# Patient Record
Sex: Female | Born: 1945 | Race: White | Hispanic: No
Health system: Southern US, Community
[De-identification: ages and names within clinical notes are randomized; demographics above are authoritative.]

## PROBLEM LIST (undated history)

## (undated) DIAGNOSIS — N200 Calculus of kidney: Secondary | ICD-10-CM

## (undated) DIAGNOSIS — Z9221 Personal history of antineoplastic chemotherapy: Secondary | ICD-10-CM

## (undated) DIAGNOSIS — Z87898 Personal history of other specified conditions: Secondary | ICD-10-CM

## (undated) DIAGNOSIS — R1310 Dysphagia, unspecified: Secondary | ICD-10-CM

## (undated) DIAGNOSIS — Z923 Personal history of irradiation: Secondary | ICD-10-CM

## (undated) DIAGNOSIS — I471 Supraventricular tachycardia, unspecified: Secondary | ICD-10-CM

## (undated) DIAGNOSIS — Z8489 Family history of other specified conditions: Secondary | ICD-10-CM

## (undated) DIAGNOSIS — E039 Hypothyroidism, unspecified: Secondary | ICD-10-CM

## (undated) DIAGNOSIS — J309 Allergic rhinitis, unspecified: Secondary | ICD-10-CM

## (undated) DIAGNOSIS — M419 Scoliosis, unspecified: Secondary | ICD-10-CM

## (undated) DIAGNOSIS — F329 Major depressive disorder, single episode, unspecified: Secondary | ICD-10-CM

## (undated) DIAGNOSIS — E7800 Pure hypercholesterolemia, unspecified: Secondary | ICD-10-CM

## (undated) DIAGNOSIS — I10 Essential (primary) hypertension: Secondary | ICD-10-CM

## (undated) DIAGNOSIS — F32A Depression, unspecified: Secondary | ICD-10-CM

## (undated) DIAGNOSIS — T8859XA Other complications of anesthesia, initial encounter: Secondary | ICD-10-CM

## (undated) DIAGNOSIS — K219 Gastro-esophageal reflux disease without esophagitis: Secondary | ICD-10-CM

## (undated) DIAGNOSIS — C50919 Malignant neoplasm of unspecified site of unspecified female breast: Secondary | ICD-10-CM

## (undated) DIAGNOSIS — F419 Anxiety disorder, unspecified: Secondary | ICD-10-CM

## (undated) DIAGNOSIS — C4490 Unspecified malignant neoplasm of skin, unspecified: Secondary | ICD-10-CM

## (undated) HISTORY — DX: Gastro-esophageal reflux disease without esophagitis: K21.9

## (undated) HISTORY — DX: Pure hypercholesterolemia, unspecified: E78.00

## (undated) HISTORY — DX: Supraventricular tachycardia, unspecified: I47.10

## (undated) HISTORY — DX: Personal history of other specified conditions: Z87.898

## (undated) HISTORY — DX: Dysphagia, unspecified: R13.10

## (undated) HISTORY — DX: Supraventricular tachycardia: I47.1

## (undated) HISTORY — DX: Malignant neoplasm of unspecified site of unspecified female breast: C50.919

## (undated) HISTORY — DX: Hypothyroidism, unspecified: E03.9

## (undated) HISTORY — DX: Allergic rhinitis, unspecified: J30.9

## (undated) HISTORY — DX: Scoliosis, unspecified: M41.9

## (undated) HISTORY — PX: BREAST EXCISIONAL BIOPSY: SUR124

## (undated) HISTORY — DX: Calculus of kidney: N20.0

## (undated) HISTORY — PX: ABDOMINAL HYSTERECTOMY: SHX81

---
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
## 1993-07-09 DIAGNOSIS — C50919 Malignant neoplasm of unspecified site of unspecified female breast: Secondary | ICD-10-CM

## 1993-07-09 HISTORY — PX: BREAST LUMPECTOMY: SHX2

## 1993-07-09 HISTORY — DX: Malignant neoplasm of unspecified site of unspecified female breast: C50.919

## 1994-07-09 HISTORY — PX: BREAST LUMPECTOMY: SHX2

## 1994-07-09 HISTORY — PX: BREAST LUMPECTOMY WITH AXILLARY LYMPH NODE DISSECTION: SHX5756

## 2004-07-14 ENCOUNTER — Ambulatory Visit: Payer: Self-pay | Admitting: General Surgery

## 2005-07-13 ENCOUNTER — Ambulatory Visit: Payer: Self-pay | Admitting: General Surgery

## 2005-08-27 ENCOUNTER — Emergency Department: Payer: Self-pay | Admitting: Emergency Medicine

## 2005-08-31 ENCOUNTER — Ambulatory Visit: Payer: Self-pay | Admitting: Unknown Physician Specialty

## 2005-09-04 ENCOUNTER — Ambulatory Visit (HOSPITAL_COMMUNITY): Admission: RE | Admit: 2005-09-04 | Discharge: 2005-09-05 | Payer: Self-pay | Admitting: Neurosurgery

## 2005-09-06 HISTORY — PX: LUMBAR LAMINECTOMY: SHX95

## 2006-07-15 ENCOUNTER — Ambulatory Visit: Payer: Self-pay | Admitting: General Surgery

## 2007-08-14 ENCOUNTER — Ambulatory Visit: Payer: Self-pay | Admitting: General Surgery

## 2007-11-11 ENCOUNTER — Ambulatory Visit: Payer: Self-pay | Admitting: Unknown Physician Specialty

## 2008-09-07 ENCOUNTER — Ambulatory Visit: Payer: Self-pay | Admitting: General Surgery

## 2008-11-05 ENCOUNTER — Emergency Department: Payer: Self-pay | Admitting: Emergency Medicine

## 2008-11-10 ENCOUNTER — Ambulatory Visit: Payer: Self-pay | Admitting: Cardiology

## 2009-07-05 ENCOUNTER — Ambulatory Visit: Payer: Self-pay | Admitting: Unknown Physician Specialty

## 2009-09-08 ENCOUNTER — Ambulatory Visit: Payer: Self-pay | Admitting: General Surgery

## 2010-09-11 ENCOUNTER — Ambulatory Visit: Payer: Self-pay | Admitting: Unknown Physician Specialty

## 2011-09-13 ENCOUNTER — Ambulatory Visit: Payer: Self-pay | Admitting: Unknown Physician Specialty

## 2012-04-03 ENCOUNTER — Ambulatory Visit: Payer: Self-pay | Admitting: Internal Medicine

## 2012-04-14 ENCOUNTER — Ambulatory Visit (INDEPENDENT_AMBULATORY_CARE_PROVIDER_SITE_OTHER): Payer: Medicare Other | Admitting: Internal Medicine

## 2012-04-14 ENCOUNTER — Encounter: Payer: Self-pay | Admitting: Internal Medicine

## 2012-04-14 VITALS — BP 116/64 | HR 62 | Temp 98.5°F | Ht 66.0 in | Wt 147.5 lb

## 2012-04-14 DIAGNOSIS — C50919 Malignant neoplasm of unspecified site of unspecified female breast: Secondary | ICD-10-CM

## 2012-04-14 DIAGNOSIS — I10 Essential (primary) hypertension: Secondary | ICD-10-CM | POA: Insufficient documentation

## 2012-04-14 DIAGNOSIS — K219 Gastro-esophageal reflux disease without esophagitis: Secondary | ICD-10-CM | POA: Insufficient documentation

## 2012-04-14 DIAGNOSIS — E119 Type 2 diabetes mellitus without complications: Secondary | ICD-10-CM | POA: Insufficient documentation

## 2012-04-14 DIAGNOSIS — E039 Hypothyroidism, unspecified: Secondary | ICD-10-CM | POA: Insufficient documentation

## 2012-04-14 DIAGNOSIS — Z853 Personal history of malignant neoplasm of breast: Secondary | ICD-10-CM | POA: Insufficient documentation

## 2012-04-14 DIAGNOSIS — Z23 Encounter for immunization: Secondary | ICD-10-CM

## 2012-04-14 DIAGNOSIS — E7800 Pure hypercholesterolemia, unspecified: Secondary | ICD-10-CM | POA: Insufficient documentation

## 2012-04-14 MED ORDER — METOPROLOL TARTRATE 50 MG PO TABS: 50.0000 mg | ORAL_TABLET | Freq: Two times a day (BID) | ORAL | Status: DC

## 2012-04-14 MED ORDER — LORAZEPAM 0.5 MG PO TABS: 0.5000 mg | ORAL_TABLET | Freq: Every day | ORAL | Status: DC | PRN

## 2012-04-14 MED ORDER — PANTOPRAZOLE SODIUM 40 MG PO TBEC: 40.0000 mg | DELAYED_RELEASE_TABLET | Freq: Two times a day (BID) | ORAL | Status: DC

## 2012-04-14 NOTE — Assessment & Plan Note (Signed)
Sugars as outlined.  Obtain last labs for review.  Continue diabetic diet and exercise as tolerated.  She continues to follow up at [facility].

## 2012-04-14 NOTE — Patient Instructions (Signed)
It was nice seeing you today.  I am going to change the Prevacid to generic Protonix.  Take as directed.  Let me know if any problems.  I am also going to change the extended release Metoprolol - to the regular Metoprolol.   You will have to take this one twice a day.  Monitory your blood pressure and pulse and let me know if any problems.  You will also be given Ativan (Lorazepam) to take as directed (as needed).  Let me know if any problems.

## 2012-04-14 NOTE — Assessment & Plan Note (Signed)
Diagnosed at age 66.  S/P chemotherapy/XRT.  Reports she is up to date with her mammogram.  Discussed genetic counseling and testing.  She is interested.  Refer to the cancer center for genetic counseling.

## 2012-04-14 NOTE — Assessment & Plan Note (Signed)
Controlled on current med regimen.  She needs to change Prevacid (secondary to cost).  Will change to generic Protonix 40mg  bid.  Notify me if problems.

## 2012-04-14 NOTE — Progress Notes (Signed)
  Subjective:    Patient ID: Michelle Hawkins, female    DOB: 02/27/1946, 66 y.o.   MRN: [id number]  HPI 66 year old female with past history of breast cancer (s/p chemotherapy/XRT), hypertension, diabetes and hypercholesterolemia who comes in today for a scheduled follow up.  She states she is doing relatively well.  She does report some increased stress and anxiety lately.  She tapered off Lexapro and has been off now for approximately one month.  She feels she needs something just to take prn.  Does not feel she needs something daily.  No depression.  She also reports her acid reflux is controlled as long as she takes Prevacid regularly.  She needs to change Prevacid and Metoprolol - secondary to cost.  States her blood pressure has been doing well and her sugars in the am mostly average 105-120.  She has been eating more sweets lately.  Plans to get back to following her diet more regularly.  She has stopped going to [employer] (closed), but does plan to get back into a regular exercise routine.    Past Medical History  Diagnosis Date  . Breast cancer   . Chicken pox   . Diabetes mellitus   . H/O ulcer disease   . GERD (gastroesophageal reflux disease)   . Hypercholesterolemia   . Nephrolithiasis   . Clotting disorder   . Hypothyroidism   . Blood transfusion     Review of Systems Patient denies any headache, lightheadedness or dizziness.  No chest pain, tightness or palpatations.  No increased shortness of breath, cough or congestion.  No nausea or vomiting.  Acid reflux controlled on her current med regimen.   No abdominal pain or cramping.  No bowel change, such as diarrhea, constipation, BRBPR or melana.  No urine change.  Has gained some weight (per her report).        Objective:   Physical Exam Filed Vitals:   04/14/12 1333  BP: 116/64  Pulse: 62  Temp: 98.5 F (36.9 C)   66year old female in no acute distress.   HEENT:  Nares - clear.  OP- without lesions or erythema.  NECK:   Supple, nontender.  No audible carotid bruit.   HEART:  Appears to be regular. LUNGS:  Without crackles or wheezing audible.  Respirations even and unlabored.   RADIAL PULSE:  Equal bilaterally.  ABDOMEN:  Soft, nontender.  No audible abdominal bruit.   EXTREMITIES:  No increased edema to be present.  DP pulses palpable and equal bilaterally.  (2 plus)                Assessment & Plan:  INCREASED STRESS/ANXIETY.  Tapered off the Lexapro.  Feels she needs something prn.  Discussed at length with her today.  Will start Ativan .5mg  and instructed to take 1/2 to one q day prn.  Monitor symptoms closely.  If problems, may need to restart a daily medicine.  No depression.   HEALTH MAINTENANCE.  Will schedule her physical in December.  Review records to see when due follow up colonoscopy and mammogram.  Flu shot given today.

## 2012-04-14 NOTE — Assessment & Plan Note (Signed)
Continue levothyroxine.  Follow tsh.   

## 2012-04-15 ENCOUNTER — Other Ambulatory Visit: Payer: Self-pay | Admitting: *Deleted

## 2012-04-15 NOTE — Assessment & Plan Note (Signed)
Blood pressure is under good control.  She would like to change the extended release Metoprolol to the "regular" Metoprolol - secondary to cost.  Will stop Metoprolol extended release and start Metoprolol Tartrate 50mg  bid.  Have her monitor her blood pressure and pulse.  If any problems, let me know.

## 2012-04-15 NOTE — Assessment & Plan Note (Signed)
On generic Lipitor.  Low cholesterol diet and exercise.  Follow lipid profile and liver panel.  Review records to see when due follow up labs.

## 2012-04-15 NOTE — Telephone Encounter (Signed)
Rx called to [facility] pharmacy. 

## 2012-04-30 ENCOUNTER — Other Ambulatory Visit: Payer: Self-pay | Admitting: *Deleted

## 2012-04-30 MED ORDER — LEVOTHYROXINE SODIUM 25 MCG PO TABS: 25.0000 ug | ORAL_TABLET | Freq: Every day | ORAL | Status: DC

## 2012-06-19 ENCOUNTER — Encounter: Payer: Medicare Other | Admitting: Internal Medicine

## 2012-06-27 ENCOUNTER — Telehealth: Payer: Self-pay | Admitting: *Deleted

## 2012-06-27 NOTE — Telephone Encounter (Signed)
Pt is coming in for labs Monday (Dec. 23,2013) What labs and dx code would you like? Thank you

## 2012-06-28 ENCOUNTER — Other Ambulatory Visit: Payer: Self-pay | Admitting: Internal Medicine

## 2012-06-28 DIAGNOSIS — C50919 Malignant neoplasm of unspecified site of unspecified female breast: Secondary | ICD-10-CM

## 2012-06-28 DIAGNOSIS — E119 Type 2 diabetes mellitus without complications: Secondary | ICD-10-CM

## 2012-06-28 DIAGNOSIS — E039 Hypothyroidism, unspecified: Secondary | ICD-10-CM

## 2012-06-28 DIAGNOSIS — E7800 Pure hypercholesterolemia, unspecified: Secondary | ICD-10-CM

## 2012-06-28 DIAGNOSIS — I10 Essential (primary) hypertension: Secondary | ICD-10-CM

## 2012-06-28 NOTE — Telephone Encounter (Signed)
Labs ordered (cbc, met b, liver panel, lipid profile, tsh, urine microalbumin/cr ratio and a1c).

## 2012-06-28 NOTE — Progress Notes (Signed)
Orders placed for labs 06/30/12.

## 2012-06-30 ENCOUNTER — Other Ambulatory Visit (INDEPENDENT_AMBULATORY_CARE_PROVIDER_SITE_OTHER): Payer: PRIVATE HEALTH INSURANCE

## 2012-06-30 DIAGNOSIS — E039 Hypothyroidism, unspecified: Secondary | ICD-10-CM

## 2012-06-30 DIAGNOSIS — E7800 Pure hypercholesterolemia, unspecified: Secondary | ICD-10-CM

## 2012-06-30 DIAGNOSIS — I10 Essential (primary) hypertension: Secondary | ICD-10-CM

## 2012-06-30 DIAGNOSIS — E119 Type 2 diabetes mellitus without complications: Secondary | ICD-10-CM

## 2012-06-30 DIAGNOSIS — C50919 Malignant neoplasm of unspecified site of unspecified female breast: Secondary | ICD-10-CM

## 2012-06-30 LAB — BASIC METABOLIC PANEL
BUN: 13 mg/dL (ref 6–23)
CO2: 25 mEq/L (ref 19–32)
Chloride: 98 mEq/L (ref 96–112)
GFR: 101.97 mL/min (ref >60.00)
Glucose, Bld: 121 mg/dL — ABNORMAL HIGH (ref 70–99)
Potassium: 4.1 mEq/L (ref 3.5–5.1)
Sodium: 132 mEq/L — ABNORMAL LOW (ref 135–145)

## 2012-06-30 LAB — LIPID PANEL
Cholesterol: 157 mg/dL (ref 0–200)
LDL Cholesterol: 89 mg/dL (ref 0–99)
Total CHOL/HDL Ratio: 3
VLDL: 15.4 mg/dL (ref 0.0–40.0)

## 2012-06-30 LAB — MICROALBUMIN / CREATININE URINE RATIO
Creatinine,U: 61.5 mg/dL
Microalb Creat Ratio: 0.3 mg/g (ref 0.0–30.0)
Microalb, Ur: 0.2 mg/dL (ref 0.0–1.9)

## 2012-06-30 LAB — HEPATIC FUNCTION PANEL
ALT: 29 U/L (ref 0–35)
AST: 25 U/L (ref 0–37)
Albumin: 3.6 g/dL (ref 3.5–5.2)
Alkaline Phosphatase: 66 U/L (ref 39–117)
Bilirubin, Direct: 0 mg/dL (ref 0.0–0.3)
Total Protein: 6.6 g/dL (ref 6.0–8.3)

## 2012-06-30 LAB — TSH: TSH: 2.13 u[IU]/mL (ref 0.35–5.50)

## 2012-06-30 LAB — CBC WITH DIFFERENTIAL/PLATELET
Eosinophils Absolute: 1.2 10*3/uL — ABNORMAL HIGH (ref 0.0–0.7)
Eosinophils Relative: 11.5 % — ABNORMAL HIGH (ref 0.0–5.0)
Lymphocytes Relative: 27.6 % (ref 12.0–46.0)
Lymphs Abs: 2.9 10*3/uL (ref 0.7–4.0)
Monocytes Absolute: 1.2 10*3/uL — ABNORMAL HIGH (ref 0.1–1.0)
Neutro Abs: 5.1 10*3/uL (ref 1.4–7.7)

## 2012-06-30 LAB — HEMOGLOBIN A1C: Hgb A1c MFr Bld: 7.3 % — ABNORMAL HIGH (ref 4.6–6.5)

## 2012-07-01 ENCOUNTER — Other Ambulatory Visit: Payer: Self-pay | Admitting: Internal Medicine

## 2012-07-01 DIAGNOSIS — D649 Anemia, unspecified: Secondary | ICD-10-CM

## 2012-07-01 DIAGNOSIS — E871 Hypo-osmolality and hyponatremia: Secondary | ICD-10-CM

## 2012-07-01 NOTE — Progress Notes (Signed)
Orders placed for follow up labs 

## 2012-07-07 ENCOUNTER — Encounter: Payer: Self-pay | Admitting: Internal Medicine

## 2012-07-07 ENCOUNTER — Ambulatory Visit (INDEPENDENT_AMBULATORY_CARE_PROVIDER_SITE_OTHER): Payer: PRIVATE HEALTH INSURANCE | Admitting: Internal Medicine

## 2012-07-07 VITALS — BP 130/80 | HR 62 | Temp 98.0°F | Ht 66.0 in | Wt 147.2 lb

## 2012-07-07 DIAGNOSIS — K219 Gastro-esophageal reflux disease without esophagitis: Secondary | ICD-10-CM

## 2012-07-07 DIAGNOSIS — C50919 Malignant neoplasm of unspecified site of unspecified female breast: Secondary | ICD-10-CM

## 2012-07-07 DIAGNOSIS — E119 Type 2 diabetes mellitus without complications: Secondary | ICD-10-CM

## 2012-07-07 DIAGNOSIS — E7800 Pure hypercholesterolemia, unspecified: Secondary | ICD-10-CM

## 2012-07-07 DIAGNOSIS — D649 Anemia, unspecified: Secondary | ICD-10-CM

## 2012-07-07 DIAGNOSIS — E871 Hypo-osmolality and hyponatremia: Secondary | ICD-10-CM

## 2012-07-07 DIAGNOSIS — I10 Essential (primary) hypertension: Secondary | ICD-10-CM

## 2012-07-07 DIAGNOSIS — E039 Hypothyroidism, unspecified: Secondary | ICD-10-CM

## 2012-07-07 DIAGNOSIS — Z139 Encounter for screening, unspecified: Secondary | ICD-10-CM

## 2012-07-07 LAB — FERRITIN: Ferritin: 16 ng/mL (ref 10.0–291.0)

## 2012-07-08 ENCOUNTER — Telehealth: Payer: Self-pay | Admitting: Internal Medicine

## 2012-07-08 LAB — CBC WITH DIFFERENTIAL/PLATELET
Basophils Relative: 0.5 % (ref 0.0–3.0)
Eosinophils Absolute: 1.1 10*3/uL — ABNORMAL HIGH (ref 0.0–0.7)
Eosinophils Relative: 9.3 % — ABNORMAL HIGH (ref 0.0–5.0)
Monocytes Absolute: 0.9 10*3/uL (ref 0.1–1.0)
Neutro Abs: 7 10*3/uL (ref 1.4–7.7)
RBC: 4.36 Mil/uL (ref 3.87–5.11)
RDW: 16 % — ABNORMAL HIGH (ref 11.5–14.6)

## 2012-07-08 NOTE — Telephone Encounter (Signed)
Pt is calling and saying that she dropped off her sugar readings yesterday but she is needing those back if possible. Please call pt

## 2012-07-09 ENCOUNTER — Encounter: Payer: Self-pay | Admitting: Internal Medicine

## 2012-07-09 ENCOUNTER — Other Ambulatory Visit: Payer: Self-pay | Admitting: Internal Medicine

## 2012-07-09 DIAGNOSIS — E871 Hypo-osmolality and hyponatremia: Secondary | ICD-10-CM | POA: Insufficient documentation

## 2012-07-09 DIAGNOSIS — D72829 Elevated white blood cell count, unspecified: Secondary | ICD-10-CM

## 2012-07-09 NOTE — Assessment & Plan Note (Signed)
Sodium just checked.  Slightly decreased.  Recheck sodium today.

## 2012-07-09 NOTE — Assessment & Plan Note (Signed)
Sugar a little elevated.  Discussed with her today.  States she has not been exercising and has not been watching what she eats.  Wants to work on diet and exercise before adding more medication.  She is due to follow up at [facility] 07/22/12.  Follow.  Hold on additional medication at this time.

## 2012-07-09 NOTE — Assessment & Plan Note (Signed)
On lipitor.  Low cholesterol diet and exercise.   Follow lipid panel and liver function.   

## 2012-07-09 NOTE — Assessment & Plan Note (Signed)
Blood pressure under good control.  Same medication.  Follow metabolic panel.   

## 2012-07-09 NOTE — Progress Notes (Signed)
Subjective:    Patient ID: Michelle Hawkins, female    DOB: 09-05-1945, 67 y.o.   MRN: [id number]  HPI 67 year old female with past history of breast cancer (s/p chemotherapy/XRT), hypertension, diabetes and hypercholesterolemia who comes in today to follow up on these issues as well as for a complete physical exam.  She states she is doing relatively well.  She has stopped going to [employer] (closed), but does plan to get back into a regular exercise routine.  Discussed her sugar.  Starting to elevate some.  She states she has not been eating like she should.  She has been eating increased sweets.  Plans to get more serious about her diet.  No acid reflux.  No bowel change.  Due a follow up colonoscopy.  She is due to follow up at [facility] 07/22/12.  No cardiac symptoms with increased activity or exertion.    Past Medical History  Diagnosis Date  . Breast cancer 1996    s/p lumpectomy (lymph node dissection - 2/11 positive), chemotherapy  . Diabetes mellitus   . H/O ulcer disease     PUD  . GERD (gastroesophageal reflux disease)   . Hypercholesterolemia   . Nephrolithiasis   . Hypothyroidism     multinodular goiter  . Scoliosis   . Dysphagia   . SVT (supraventricular tachycardia)   . Allergic rhinitis     Current Outpatient Prescriptions on File Prior to Visit  Medication Sig Dispense Refill  . aspirin 81 MG tablet Take 81 mg by mouth daily.      Marland Kitchen atorvastatin (LIPITOR) 20 MG tablet Take 20 mg by mouth daily.      . Biotin (BIOTIN 5000) 5 MG CAPS Take by mouth.      . calcium carbonate 200 MG capsule Take 250 mg by mouth 2 (two) times daily with a meal.      . cholecalciferol (VITAMIN D) 400 UNITS TABS Take by mouth.      . escitalopram (LEXAPRO) 20 MG tablet Take 20 mg by mouth daily.      . fish oil-omega-3 fatty acids 1000 MG capsule Take 2 g by mouth daily.      Marland Kitchen levothyroxine (SYNTHROID, LEVOTHROID) 25 MCG tablet Take 1 tablet (25 mcg total) by mouth daily.  90 tablet  1  .  LORazepam (ATIVAN) 0.5 MG tablet Take 1 tablet (0.5 mg total) by mouth daily as needed for anxiety (take 1/2 to one tablet q day prn. ).  30 tablet  1  . metFORMIN (GLUCOPHAGE) 500 MG tablet Take 500 mg by mouth 2 (two) times daily with a meal.      . metoprolol (LOPRESSOR) 50 MG tablet Take 1 tablet (50 mg total) by mouth 2 (two) times daily.  180 tablet  3  . pantoprazole (PROTONIX) 40 MG tablet Take 1 tablet (40 mg total) by mouth 2 (two) times daily.  180 tablet  3  . ramipril (ALTACE) 10 MG capsule Take 10 mg by mouth daily.      . TURMERIC PO Take by mouth.        Review of Systems Patient denies any headache, lightheadedness or dizziness.  No chest pain, tightness or palpatations. No increased shortness of breath, cough or congestion.  No nausea or vomiting.  Acid reflux controlled on her current med regimen.   No abdominal pain or cramping.  No bowel change, such as diarrhea, constipation, BRBPR or melana.  No urine change.  Plans to get more  serious about her diet and exercise.        Objective:   Physical Exam  Filed Vitals:   07/07/12 1021  BP: 130/80  Pulse: 62  Temp: 98 F (15.71 C)   67 year old female in no acute distress.   HEENT:  Nares- clear.  Oropharynx - without lesions. NECK:  Supple.  Nontender.  No audible bruit.  HEART:  Appears to be regular. LUNGS:  No crackles or wheezing audible.  Respirations even and unlabored.  RADIAL PULSE:  Equal bilaterally.    BREASTS:  No nipple discharge or nipple retraction present.  Could not appreciate any distinct nodules or axillary adenopathy.  ABDOMEN:  Soft, nontender.  Bowel sounds present and normal.  No audible abdominal bruit.  GU:  Normal external genitalia.  Vaginal vault without lesions.  S/P hysterectomy.  Could not appreciate any adnexal masses or tenderness.   RECTAL:  Heme negative.   EXTREMITIES:  No increased edema present.  DP pulses palpable and equal bilaterally.           Assessment & Plan:  INCREASED  STRESS/ANXIETY.  Off the Lexapro.  Has Ativan .5mg  and instructed to take 1/2 to one q day prn.  Monitor symptoms closely.  If problems, may need to restart a daily medicine.  No depression.   ANEMIA.  Recheck cbc and ferritin today.    HEALTH MAINTENANCE.  Physical today.  She is s/p hysterectomy.  Flu shot given last visit.  Mammogram 09/23/11 BiRADS II.  Last colonoscopy 2004.  Due a follow up colonoscopy 2014.  Will schedule an appt with GI.

## 2012-07-09 NOTE — Assessment & Plan Note (Signed)
Previously saw Dr Lemar Livings and Dr Doylene Canning.  Has been released.  Mammogram 09/13/11 - BiRADS II.

## 2012-07-09 NOTE — Assessment & Plan Note (Signed)
On synthroid.  Follow tsh.   

## 2012-07-09 NOTE — Progress Notes (Signed)
Pt notified of lab results and need for follow up labs.  She is going to come in 07/30/12 at 10:30 for follow up labs.  Please put on lab schedule.  Thanks.  Pt aware of appt.

## 2012-07-09 NOTE — Assessment & Plan Note (Signed)
Symptoms controlled.  Continue prevacid.

## 2012-07-21 ENCOUNTER — Other Ambulatory Visit: Payer: Self-pay | Admitting: Internal Medicine

## 2012-07-21 ENCOUNTER — Telehealth: Payer: Self-pay | Admitting: Internal Medicine

## 2012-07-21 MED ORDER — ATORVASTATIN CALCIUM 20 MG PO TABS: 20.0000 mg | ORAL_TABLET | Freq: Every day | ORAL | Status: DC

## 2012-07-21 MED ORDER — LEVOTHYROXINE SODIUM 25 MCG PO TABS: 25.0000 ug | ORAL_TABLET | Freq: Every day | ORAL | Status: DC

## 2012-07-21 MED ORDER — METOPROLOL TARTRATE 50 MG PO TABS: 50.0000 mg | ORAL_TABLET | Freq: Two times a day (BID) | ORAL | Status: DC

## 2012-07-21 MED ORDER — PANTOPRAZOLE SODIUM 40 MG PO TBEC: 40.0000 mg | DELAYED_RELEASE_TABLET | Freq: Two times a day (BID) | ORAL | Status: DC

## 2012-07-21 MED ORDER — RAMIPRIL 10 MG PO CAPS: 10.0000 mg | ORAL_CAPSULE | Freq: Every day | ORAL | Status: DC

## 2012-07-21 MED ORDER — LORAZEPAM 0.5 MG PO TABS: ORAL_TABLET | ORAL | Status: DC

## 2012-07-21 MED ORDER — METFORMIN HCL 500 MG PO TABS: 500.0000 mg | ORAL_TABLET | Freq: Two times a day (BID) | ORAL | Status: DC

## 2012-07-21 MED ORDER — GLUCOSE BLOOD VI STRP: ORAL_STRIP | Status: DC

## 2012-07-21 NOTE — Progress Notes (Signed)
rx printed for meds as requested.

## 2012-07-21 NOTE — Telephone Encounter (Signed)
Pt is needing refills on Atorvastatin 20 mg,Levothyroxine 25 mcg, lorazepam 0.5 mg, metformin 500 mg, metroprolol 50 mg, pantoprazole 40 mg and ramipril 10 mg. She also needs her One Touch Test Strips. She is needing all these in paper form so she can mail them into her new pharmacy. She says she will be in town tomorrow morning and was wanting to know if she could have them then.

## 2012-07-21 NOTE — Telephone Encounter (Signed)
Prescriptions printed and on your desk.

## 2012-07-22 NOTE — Telephone Encounter (Signed)
Called patient to let her know her prescriptions are waiting up front for her.

## 2012-07-30 ENCOUNTER — Other Ambulatory Visit: Payer: Self-pay | Admitting: Internal Medicine

## 2012-07-30 ENCOUNTER — Other Ambulatory Visit: Payer: PRIVATE HEALTH INSURANCE

## 2012-07-30 ENCOUNTER — Other Ambulatory Visit (INDEPENDENT_AMBULATORY_CARE_PROVIDER_SITE_OTHER): Payer: Medicare Other

## 2012-07-30 DIAGNOSIS — D72829 Elevated white blood cell count, unspecified: Secondary | ICD-10-CM

## 2012-07-30 LAB — CBC WITH DIFFERENTIAL/PLATELET
Basophils Absolute: 0.1 10*3/uL (ref 0.0–0.1)
Eosinophils Relative: 8.1 % — ABNORMAL HIGH (ref 0.0–5.0)
HCT: 38.4 % (ref 36.0–46.0)
Lymphs Abs: 3.6 10*3/uL (ref 0.7–4.0)
MCHC: 32.8 g/dL (ref 30.0–36.0)
MCV: 85.7 fl (ref 78.0–100.0)
Monocytes Absolute: 1.2 10*3/uL — ABNORMAL HIGH (ref 0.1–1.0)
Platelets: 381 10*3/uL (ref 150.0–400.0)
RDW: 15.7 % — ABNORMAL HIGH (ref 11.5–14.6)

## 2012-07-30 NOTE — Telephone Encounter (Signed)
[facility]  Pt came in today for labs and stated she has mail order pharmacy and has ordered the below rx  But she needs 2 weeks sent to [facility] pharmcy so she will have enough till her mail order comes in  levothroxine 25 mcg tabl levothroxine sodium Take 1 tablet ( total) by mouth daily   Take on empty stomach

## 2012-07-31 MED ORDER — LEVOTHYROXINE SODIUM 25 MCG PO TABS: 25.0000 ug | ORAL_TABLET | Freq: Every day | ORAL | Status: DC

## 2012-07-31 NOTE — Telephone Encounter (Signed)
Sent in to pharmacy.  

## 2012-08-04 ENCOUNTER — Other Ambulatory Visit: Payer: Self-pay | Admitting: Internal Medicine

## 2012-08-04 DIAGNOSIS — D72829 Elevated white blood cell count, unspecified: Secondary | ICD-10-CM

## 2012-08-04 NOTE — Progress Notes (Signed)
Order placed for follow up cbc 

## 2012-08-28 ENCOUNTER — Telehealth: Payer: Self-pay | Admitting: Internal Medicine

## 2012-08-28 DIAGNOSIS — C50919 Malignant neoplasm of unspecified site of unspecified female breast: Secondary | ICD-10-CM

## 2012-08-28 NOTE — Telephone Encounter (Signed)
Pt is needing a new One Touch Ultra 2 meter and the strips for it. Her metformin rx is written wrong she is supposed to take 2 in the AM and 2 in the PM but Dr. Lorin Picket wrote it for 1 in the AM and 1 in the PM. She is wanting to know if we could change is back to 2 time in the AM and 2 times in the PM and its 500 mg per Pill. She mails it in to her mail in pharmacy which [facility].

## 2012-08-28 NOTE — Telephone Encounter (Signed)
Pt came by wanting [place] to schedule her Diagnostic Mammo. She uses [facility] and would like a mid-morning appt if possible. I looked up the order and Dr. Lorin Picket put it as a screening instead of a Diagnostic pt has history of Breat Cancer.

## 2012-08-28 NOTE — Telephone Encounter (Signed)
Pt says she would like to go on what she was on before she started the Adavant. She does not want to take that any more because it is more habit forming. She would like to take what she was on before which was something she took everyday.

## 2012-08-29 MED ORDER — METFORMIN HCL 500 MG PO TABS: ORAL_TABLET | ORAL | Status: DC

## 2012-08-29 MED ORDER — GLUCOSE BLOOD VI STRP: ORAL_STRIP | Status: DC

## 2012-08-29 NOTE — Telephone Encounter (Signed)
Order placed for metformin.  I changed the directions to metformin 500mg  (2 tablets bid).  I also placed the order for the mammogram and printed a prescription for her test strips.   Let me know if she needs anything else.

## 2012-08-29 NOTE — Telephone Encounter (Signed)
She was previously on Lexapro.  Notify pt can restart lexapro 10mg  q day.  Will need to send in to her pharmacy.  Also add to med list ----if pt agreeable to start.   I will print out a prescription for the meter.  We do not have this type of meter here in the office.  I will place order for mammo.

## 2012-08-29 NOTE — Telephone Encounter (Signed)
Patient notified

## 2012-09-04 ENCOUNTER — Other Ambulatory Visit: Payer: Self-pay | Admitting: *Deleted

## 2012-09-04 ENCOUNTER — Other Ambulatory Visit (INDEPENDENT_AMBULATORY_CARE_PROVIDER_SITE_OTHER): Payer: Medicare Other

## 2012-09-04 DIAGNOSIS — D72829 Elevated white blood cell count, unspecified: Secondary | ICD-10-CM

## 2012-09-04 LAB — CBC WITH DIFFERENTIAL/PLATELET
Eosinophils Relative: 6 % — ABNORMAL HIGH (ref 0.0–5.0)
MCV: 84.9 fl (ref 78.0–100.0)
Monocytes Absolute: 1 10*3/uL (ref 0.1–1.0)
Neutrophils Relative %: 54 % (ref 43.0–77.0)
Platelets: 367 10*3/uL (ref 150.0–400.0)
WBC: 9.5 10*3/uL (ref 4.5–10.5)

## 2012-09-04 MED ORDER — GLUCOSE BLOOD VI STRP: ORAL_STRIP | Status: DC

## 2012-09-04 NOTE — Telephone Encounter (Signed)
RX's given to patient in office.

## 2012-09-05 ENCOUNTER — Encounter: Payer: Self-pay | Admitting: Internal Medicine

## 2012-09-06 HISTORY — PX: BREAST BIOPSY: SHX20

## 2012-09-15 ENCOUNTER — Ambulatory Visit: Payer: Self-pay | Admitting: Internal Medicine

## 2012-09-16 ENCOUNTER — Ambulatory Visit: Payer: Self-pay | Admitting: Internal Medicine

## 2012-09-17 ENCOUNTER — Telehealth: Payer: Self-pay | Admitting: General Practice

## 2012-09-17 DIAGNOSIS — R928 Other abnormal and inconclusive findings on diagnostic imaging of breast: Secondary | ICD-10-CM

## 2012-09-17 NOTE — Telephone Encounter (Signed)
Pt notified of mammo results and need for referral to surgery for evaluation/question of biopsy.  Wants to see Dr Lemar Livings.  Order placed for referral.

## 2012-09-17 NOTE — Telephone Encounter (Signed)
Called pt on both cell and home phone number.  Unable to reach.  Will try back.

## 2012-09-17 NOTE — Telephone Encounter (Signed)
Michelle Hawkins from [facility] called stating that we were sent an abnormal mammogram result. Wanted to make sure we had set pt up with a surgeon. Please call them back if this has been completed.

## 2012-09-17 NOTE — Telephone Encounter (Signed)
Dr. Lorin Picket did you receive these results?

## 2012-09-17 NOTE — Telephone Encounter (Signed)
Ms Kot notified of results and need for referral to surgery.  Christy notified.  Order placed for referral.

## 2012-09-19 ENCOUNTER — Encounter: Payer: Self-pay | Admitting: *Deleted

## 2012-09-24 ENCOUNTER — Telehealth: Payer: Self-pay | Admitting: Internal Medicine

## 2012-09-24 NOTE — Telephone Encounter (Signed)
Pt came in today wanting to get a rx for a glucose machine and test strips.  She has sent this to 2 different mail order and they dont carry this. Pt wanted to know if you could send this to a local pharmcy  [facility]   Please advise pt when this is done  Pt stated her meter is not working Can this be called in asap

## 2012-09-25 ENCOUNTER — Ambulatory Visit (INDEPENDENT_AMBULATORY_CARE_PROVIDER_SITE_OTHER): Payer: Medicare Other | Admitting: General Surgery

## 2012-09-25 ENCOUNTER — Encounter: Payer: Self-pay | Admitting: General Surgery

## 2012-09-25 VITALS — BP 120/80 | HR 64 | Resp 12 | Ht 65.0 in | Wt 148.0 lb

## 2012-09-25 DIAGNOSIS — R928 Other abnormal and inconclusive findings on diagnostic imaging of breast: Secondary | ICD-10-CM

## 2012-09-25 DIAGNOSIS — N63 Unspecified lump in unspecified breast: Secondary | ICD-10-CM

## 2012-09-25 MED ORDER — ONETOUCH ULTRA SYSTEM W/DEVICE KIT: 1.0000 | PACK | Freq: Once | Status: DC

## 2012-09-25 MED ORDER — GLUCOSE BLOOD VI STRP: ORAL_STRIP | Status: DC

## 2012-09-25 NOTE — Patient Instructions (Addendum)

## 2012-09-25 NOTE — Progress Notes (Addendum)
Patient ID: Michelle Hawkins, female   DOB: 1945-09-10, 67 y.o.   MRN: [id number]  Chief Complaint  Patient presents with  . Breast Cancer Long Term Follow Up    follow up mammogram    HPI Michelle Hawkins is a 67 y.o. female. Patient present for a follow up mammogram which was performed on 09/16/12 with a birad category 4. Patient has a history of left breast cancer in 1996. She received treatment by having a left lumpectomy done at that time. Patient performs self breast exams and gets regular mammograms. She states no new breast problems.  HPI  Past Medical History  Diagnosis Date  . Breast cancer 1996    s/p lumpectomy (lymph node dissection - 2/11 positive), chemotherapy  . Diabetes mellitus   . H/O ulcer disease     PUD  . GERD (gastroesophageal reflux disease)   . Hypercholesterolemia   . Nephrolithiasis   . Hypothyroidism     multinodular goiter  . Scoliosis   . Dysphagia   . SVT (supraventricular tachycardia)   . Allergic rhinitis     Past Surgical History  Procedure Laterality Date  . Breast lumpectomy with axillary lymph node dissection  1996    left  . Abdominal hysterectomy      partial  . Lumbar laminectomy  3/07    L5-S1    Family History  Problem Relation Age of Onset  . Heart disease Father   . Hypertension Father   . Diabetes Father     Social History History  Substance Use Topics  . Smoking status: Former Smoker    Types: Cigarettes    Quit date: 04/14/2004  . Smokeless tobacco: Never Used  . Alcohol Use: No    No Known Allergies  Current Outpatient Prescriptions  Medication Sig Dispense Refill  . aspirin 81 MG tablet Take 81 mg by mouth daily.      Marland Kitchen atorvastatin (LIPITOR) 20 MG tablet Take 1 tablet (20 mg total) by mouth daily.  90 tablet  3  . Biotin (BIOTIN 5000) 5 MG CAPS Take by mouth.      . Blood Glucose Monitoring Suppl (ONE TOUCH ULTRA SYSTEM KIT) W/DEVICE KIT 1 kit by Does not apply route once.  1 each  0  . calcium carbonate 200 MG  capsule Take 250 mg by mouth 2 (two) times daily with a meal.      . cholecalciferol (VITAMIN D) 400 UNITS TABS Take by mouth.      . fish oil-omega-3 fatty acids 1000 MG capsule Take 2 g by mouth daily.      Marland Kitchen glucose blood (ONE TOUCH ULTRA TEST) test strip One touch ultra II test strips Check blood sugar bid  Dx 250.00  100 each  12  . levothyroxine (SYNTHROID, LEVOTHROID) 25 MCG tablet Take 1 tablet (25 mcg total) by mouth daily.  14 tablet  0  . LORazepam (ATIVAN) 0.5 MG tablet 1/2 tablet q day prn  30 tablet  1  . metFORMIN (GLUCOPHAGE) 500 MG tablet Take two tablets bid  360 tablet  3  . metoprolol (LOPRESSOR) 50 MG tablet Take 1 tablet (50 mg total) by mouth 2 (two) times daily.  180 tablet  3  . pantoprazole (PROTONIX) 40 MG tablet Take 1 tablet (40 mg total) by mouth 2 (two) times daily.  180 tablet  1  . ramipril (ALTACE) 10 MG capsule Take 1 capsule (10 mg total) by mouth daily.  90 capsule  3  .  TURMERIC PO Take by mouth.       No current facility-administered medications for this visit.    Review of Systems Review of Systems  Constitutional: Negative.   Respiratory: Negative.   Cardiovascular: Negative.     Blood pressure 120/80, pulse 64, resp. rate 12, height 5\' 5"  (1.651 m), weight 148 lb (67.132 kg).  Physical Exam Physical Exam  Constitutional: She appears well-developed and well-nourished.  Neck: Normal range of motion. Neck supple.  Cardiovascular: Normal rate, regular rhythm and normal heart sounds.   Pulmonary/Chest: Effort normal and breath sounds normal.  Well healed scar in left axilla at 3 oclock.     Data Reviewed Mammogram dated September 16, 2012 and ultrasound the same date showed an ill-defined hypoechoic area near the surgical scar measuring up to 0.9 cm in diameter.BIRAD-4.  Screening mammograms dated September 15, 2012 showed the right breast to be unremarkablewith architectural distortion the left. BIRAD-0.  Assessment    Abnormal left breast  mammogram.    Plan    Vacuum biopsy to be completed today.       Michelle Hawkins 09/26/2012, 12:26 PM

## 2012-09-25 NOTE — Telephone Encounter (Signed)
Called patient to let her know prescriptions are waiting to be picked up at front desk.

## 2012-09-26 NOTE — Addendum Note (Signed)
Addended by: [name] on: 09/26/2012 12:30 PM   Modules accepted: Orders

## 2012-09-26 NOTE — Procedures (Signed)
Ultrasound confirmed a 0.74 x 0.91 x 1.1 cm hypo-echoic nodule in the 2 o'clock position of the breast 3 cm from the nipple. The patient was amenable to biopsy. This was completed using 10 cc of 0.5% Xylocaine with 0.25% Marcaine with 1-200,000 of epinephrine. A 10-gauge Encor device was used and a core samples obtained. No bleeding was noted. The skin defect was closed with benzoin and Steri-Strips followed by Telfa and Tegaderm dressing.

## 2012-09-29 ENCOUNTER — Telehealth: Payer: Self-pay | Admitting: General Surgery

## 2012-09-29 LAB — PATHOLOGY

## 2012-09-29 NOTE — Telephone Encounter (Signed)
Patient notified preliminary report on recent biopsy was benign. Final to follow in 24 hours.

## 2012-10-01 ENCOUNTER — Ambulatory Visit (INDEPENDENT_AMBULATORY_CARE_PROVIDER_SITE_OTHER): Payer: Medicare Other | Admitting: Internal Medicine

## 2012-10-01 ENCOUNTER — Encounter: Payer: Self-pay | Admitting: Internal Medicine

## 2012-10-01 ENCOUNTER — Encounter: Payer: Self-pay | Admitting: General Surgery

## 2012-10-01 ENCOUNTER — Telehealth: Payer: Self-pay | Admitting: Internal Medicine

## 2012-10-01 VITALS — BP 140/70 | HR 64 | Temp 97.4°F | Ht 66.0 in | Wt 147.8 lb

## 2012-10-01 DIAGNOSIS — E119 Type 2 diabetes mellitus without complications: Secondary | ICD-10-CM

## 2012-10-01 DIAGNOSIS — E039 Hypothyroidism, unspecified: Secondary | ICD-10-CM

## 2012-10-01 DIAGNOSIS — I10 Essential (primary) hypertension: Secondary | ICD-10-CM

## 2012-10-01 DIAGNOSIS — E871 Hypo-osmolality and hyponatremia: Secondary | ICD-10-CM

## 2012-10-01 DIAGNOSIS — K219 Gastro-esophageal reflux disease without esophagitis: Secondary | ICD-10-CM

## 2012-10-01 DIAGNOSIS — E7800 Pure hypercholesterolemia, unspecified: Secondary | ICD-10-CM

## 2012-10-01 LAB — BASIC METABOLIC PANEL
BUN: 10 mg/dL (ref 6–23)
CO2: 26 mEq/L (ref 19–32)
Chloride: 97 mEq/L (ref 96–112)
Potassium: 4.8 mEq/L (ref 3.5–5.1)

## 2012-10-01 LAB — HEPATIC FUNCTION PANEL
ALT: 34 U/L (ref 0–35)
AST: 34 U/L (ref 0–37)
Albumin: 4.1 g/dL (ref 3.5–5.2)
Alkaline Phosphatase: 72 U/L (ref 39–117)
Total Bilirubin: 0.5 mg/dL (ref 0.3–1.2)

## 2012-10-01 LAB — LIPID PANEL
Cholesterol: 183 mg/dL (ref 0–200)
HDL: 58.9 mg/dL (ref >39.00)
VLDL: 19.6 mg/dL (ref 0.0–40.0)

## 2012-10-01 LAB — HEMOGLOBIN A1C: Hgb A1c MFr Bld: 7.3 % — ABNORMAL HIGH (ref 4.6–6.5)

## 2012-10-01 MED ORDER — ESCITALOPRAM OXALATE 20 MG PO TABS: 20.0000 mg | ORAL_TABLET | Freq: Every day | ORAL | Status: DC

## 2012-10-01 NOTE — Telephone Encounter (Signed)
Meter is called One touch ultra II , Lancets One touch delica. The patient called to inform Dr. Lorin Picket.

## 2012-10-01 NOTE — Telephone Encounter (Signed)
rx written and on your desk 

## 2012-10-02 ENCOUNTER — Ambulatory Visit (INDEPENDENT_AMBULATORY_CARE_PROVIDER_SITE_OTHER): Payer: Medicare Other | Admitting: *Deleted

## 2012-10-02 ENCOUNTER — Encounter: Payer: Self-pay | Admitting: *Deleted

## 2012-10-02 DIAGNOSIS — Z853 Personal history of malignant neoplasm of breast: Secondary | ICD-10-CM

## 2012-10-02 NOTE — Progress Notes (Signed)
Patient here today for follow up post left breast biopsy.   Minimal bruising noted.  The patient is aware that a heating pad may be used for comfort as needed.  Aware of pathology. Follow up with primary care and here as needed.

## 2012-10-02 NOTE — Progress Notes (Addendum)
Biopsy pathology reviewed:  PATH REPORT.SITE OF ORIGIN SPEC  Comment    Comments: Material submitted: Marland Kitchen LEFT BREAST 2:00 *STAT* PATH REPORT.FINAL DX SPEC  Comment    Comments: Clinician provided ICD-9: 611.72 ; Lump or mass in breast PATH REPORT.FINAL DX SPEC  Comment    Comments:  Diagnosis: LEFT BREAST 2:00 *STAT*: - BENIGN BREAST TISSUE WITH SCLEROSING ADENOSIS AND APOCRINE CYSTS. - BENIGN BREAST TISSUE WITH ASSOCIATED MICROCALCIFICATIONS. - DENSE SCLEROTIC BREAST STROMA AND CHANGES CONSISTENT WITH PREVIOUS TREATMENT. - NEGATIVE FOR ATYPIA AND MALIGNANCY. COMMENT: Multiple additional deeper H/E levels were examined. Preliminary results of this case were discussed with Dr. Lemar Livings on September 29, 2012 by Dr. Excell Seltzer. The final results remain unchanged. XDB/09/29/2012 SIGNED OUT BY:  Comment    Comments: Electronically signed: . Gilman Schmidt, MD, Pathologist  The patient will follow up with the nurse as scheduled.  Will arrange for a follow up mammogram in six months.

## 2012-10-02 NOTE — Telephone Encounter (Signed)
Patient notified and pick up this morning.

## 2012-10-02 NOTE — Plan of Care (Addendum)
Biopsy pathology reviewed:  PATH REPORT.SITE OF ORIGIN SPEC  Comment    Comments: Material submitted: . LEFT BREAST 2:00 *STAT* PATH REPORT.FINAL DX SPEC  Comment    Comments: Clinician provided ICD-9: 611.72 ; Lump or mass in breast PATH REPORT.FINAL DX SPEC  Comment    Comments:  Diagnosis: LEFT BREAST 2:00 *STAT*: - BENIGN BREAST TISSUE WITH SCLEROSING ADENOSIS AND APOCRINE CYSTS. - BENIGN BREAST TISSUE WITH ASSOCIATED MICROCALCIFICATIONS. - DENSE SCLEROTIC BREAST STROMA AND CHANGES CONSISTENT WITH PREVIOUS TREATMENT. - NEGATIVE FOR ATYPIA AND MALIGNANCY. COMMENT: Multiple additional deeper H/E levels were examined. Preliminary results of this case were discussed with Dr. Byrnett on September 29, 2012 by Dr. Baker. The final results remain unchanged. XDB/09/29/2012 SIGNED OUT BY:  Comment    Comments: Electronically signed: . Dana D Baker, MD, Pathologist  The patient will follow up with the nurse as scheduled.  Will arrange for a follow up mammogram in six months.   

## 2012-10-06 ENCOUNTER — Encounter: Payer: Self-pay | Admitting: Internal Medicine

## 2012-10-06 NOTE — Assessment & Plan Note (Signed)
Symptoms controlled.  Continue prevacid.

## 2012-10-06 NOTE — Assessment & Plan Note (Signed)
Blood pressure under good control.  Same medication.  Follow metabolic panel.   

## 2012-10-06 NOTE — Assessment & Plan Note (Signed)
On synthroid.  Follow tsh.   

## 2012-10-06 NOTE — Progress Notes (Addendum)
Subjective:    Patient ID: Michelle Hawkins, female    DOB: 10-17-1945, 67 y.o.   MRN: [id number]  HPI 67 year old female with past history of breast cancer (s/p chemotherapy/XRT), hypertension, diabetes and hypercholesterolemia who comes in today for a scheduled follow up.  She states she is doing relatively well.  She has stopped going to [employer] (closed), but does plan to get back into a regular exercise routine.  Has still not started back exercising regularly.  Not watching what she is eating.  Discussed her sugar.  Starting to elevate some.  She has been eating increased sweets.  Plans to get more serious about her diet.  No acid reflux.  No bowel change.  Due a follow up colonoscopy.  No cardiac symptoms with increased activity or exertion.  She has seen Dr Lemar Livings recently.  Had a breast biopsy.  Was told preliminary - ok.  Has a follow up with him next week.  She had questions about genetic testing.  Phone number given to call.  Have already made referral.  She would like to restart lexapro.  Feel this may help to level things off.  Just taking a prn ativan currently.  Was previously on 20mg  lexapro.  Did well with this dose.     Past Medical History  Diagnosis Date  . Breast cancer 1996    s/p lumpectomy (lymph node dissection - 2/11 positive), chemotherapy  . Diabetes mellitus   . H/O ulcer disease     PUD  . GERD (gastroesophageal reflux disease)   . Hypercholesterolemia   . Nephrolithiasis   . Hypothyroidism     multinodular goiter  . Scoliosis   . Dysphagia   . SVT (supraventricular tachycardia)   . Allergic rhinitis     Current Outpatient Prescriptions on File Prior to Visit  Medication Sig Dispense Refill  . aspirin 81 MG tablet Take 81 mg by mouth daily.      Marland Kitchen atorvastatin (LIPITOR) 20 MG tablet Take 1 tablet (20 mg total) by mouth daily.  90 tablet  3  . Biotin (BIOTIN 5000) 5 MG CAPS Take by mouth.      . Blood Glucose Monitoring Suppl (ONE TOUCH ULTRA SYSTEM KIT)  W/DEVICE KIT 1 kit by Does not apply route once.  1 each  0  . calcium carbonate 200 MG capsule Take 250 mg by mouth 2 (two) times daily with a meal.      . cholecalciferol (VITAMIN D) 400 UNITS TABS Take by mouth.      . fish oil-omega-3 fatty acids 1000 MG capsule Take 2 g by mouth daily.      Marland Kitchen glucose blood (ONE TOUCH ULTRA TEST) test strip One touch ultra II test strips Check blood sugar bid  Dx 250.00  100 each  12  . levothyroxine (SYNTHROID, LEVOTHROID) 25 MCG tablet Take 1 tablet (25 mcg total) by mouth daily.  14 tablet  0  . LORazepam (ATIVAN) 0.5 MG tablet 1/2 tablet q day prn  30 tablet  1  . metFORMIN (GLUCOPHAGE) 500 MG tablet Take two tablets bid  360 tablet  3  . metoprolol (LOPRESSOR) 50 MG tablet Take 1 tablet (50 mg total) by mouth 2 (two) times daily.  180 tablet  3  . pantoprazole (PROTONIX) 40 MG tablet Take 1 tablet (40 mg total) by mouth 2 (two) times daily.  180 tablet  1  . ramipril (ALTACE) 10 MG capsule Take 1 capsule (10 mg total) by  mouth daily.  90 capsule  3  . TURMERIC PO Take by mouth daily.        No current facility-administered medications on file prior to visit.    Review of Systems Patient denies any headache, lightheadedness or dizziness.  No chest pain, tightness or palpitations. No increased shortness of breath, cough or congestion.  No nausea or vomiting.  Acid reflux controlled on her current med regimen.   No abdominal pain or cramping.  No bowel change, such as diarrhea, constipation, BRBPR or melana.  No urine change.  Plans to get more serious about her diet and exercise.        Objective:   Physical Exam  Filed Vitals:   10/01/12 1026  BP: 140/70  Pulse: 64  Temp: 97.4 F (85.41 C)   67 year old female in no acute distress.   HEENT:  Nares- clear.  Oropharynx - without lesions. NECK:  Supple.  Nontender.  No audible bruit.  HEART:  Appears to be regular. LUNGS:  No crackles or wheezing audible.  Respirations even and unlabored.   RADIAL PULSE:  Equal bilaterally.  ABDOMEN:  Soft, nontender.  Bowel sounds present and normal.  No audible abdominal bruit.    EXTREMITIES:  No increased edema present.  DP pulses palpable and equal bilaterally.           Assessment & Plan:  INCREASED STRESS/ANXIETY.  Off the Lexapro.  Discussed restarting.  She wants to restart.  Will restart Lexapro 20mg  qday.   Has Ativan .5mg  if needed.   No depression.  Follow.   ANEMIA.  Follow cbc and ferritin.  Last check wnl.   ABNORMAL MAMMOGRAM.  Referred to Dr Lemar Livings.  S/p biopsy.  Preliminary "ok".  Keep f/up appt next week.     HEALTH MAINTENANCE.  Physical last visit.  She is s/p hysterectomy.   Mammogram 09/23/11 BiRADS II.  Most recent mammo - Birads 4.  Referred to Dr Lemar Livings.  S/p biopsy.  Has follow up next week.  Last colonoscopy 2004.  Due a follow up colonoscopy 2014.  Scheduled an appt with GI.    Addendum.  Received colonoscopy report.  Pt had colonoscopy 10/14/12 - revealed internal hemorrhoids otherwise normal.  Recommended follow up colonoscopy in 10 years.

## 2012-10-06 NOTE — Assessment & Plan Note (Signed)
Previously saw Dr Lemar Livings and Dr Doylene Canning.  Had been released.  Mammogram 09/13/11 - BiRADS II.  Recent abnormal mammogram.  Seeing Dr Lemar Livings.  S/p breast biopsy.  Preliminary "ok".  Keep follow up appt next week.

## 2012-10-06 NOTE — Assessment & Plan Note (Signed)
Sugar a little elevated.  Discussed with her today.  States she has not been exercising and has not been watching what she eats.  Wants to work on diet and exercise before adding more medication.  Follow.  Hold on additional medication at this time.

## 2012-10-06 NOTE — Assessment & Plan Note (Signed)
On lipitor.  Low cholesterol diet and exercise.   Follow lipid panel and liver function.   

## 2012-10-14 ENCOUNTER — Ambulatory Visit: Payer: Self-pay | Admitting: Unknown Physician Specialty

## 2012-10-14 LAB — HM COLONOSCOPY

## 2012-10-29 ENCOUNTER — Telehealth: Payer: Self-pay | Admitting: Internal Medicine

## 2012-10-29 NOTE — Telephone Encounter (Signed)
Blood sugar log dropped off and placed in Tullo's file.

## 2012-11-13 ENCOUNTER — Encounter: Payer: Self-pay | Admitting: Internal Medicine

## 2013-01-20 ENCOUNTER — Ambulatory Visit (INDEPENDENT_AMBULATORY_CARE_PROVIDER_SITE_OTHER): Payer: Medicare Other | Admitting: Internal Medicine

## 2013-01-20 ENCOUNTER — Encounter: Payer: Self-pay | Admitting: Internal Medicine

## 2013-01-20 VITALS — BP 130/70 | HR 59 | Temp 98.1°F | Ht 66.0 in | Wt 144.5 lb

## 2013-01-20 DIAGNOSIS — E7800 Pure hypercholesterolemia, unspecified: Secondary | ICD-10-CM

## 2013-01-20 DIAGNOSIS — E119 Type 2 diabetes mellitus without complications: Secondary | ICD-10-CM

## 2013-01-20 DIAGNOSIS — K219 Gastro-esophageal reflux disease without esophagitis: Secondary | ICD-10-CM

## 2013-01-20 DIAGNOSIS — E039 Hypothyroidism, unspecified: Secondary | ICD-10-CM

## 2013-01-20 DIAGNOSIS — E871 Hypo-osmolality and hyponatremia: Secondary | ICD-10-CM

## 2013-01-20 DIAGNOSIS — I10 Essential (primary) hypertension: Secondary | ICD-10-CM

## 2013-01-20 LAB — BASIC METABOLIC PANEL
BUN: 12 mg/dL (ref 6–23)
CO2: 29 mEq/L (ref 19–32)
Chloride: 97 mEq/L (ref 96–112)
GFR: 91.5 mL/min (ref >60.00)
Glucose, Bld: 118 mg/dL — ABNORMAL HIGH (ref 70–99)
Potassium: 4.8 mEq/L (ref 3.5–5.1)
Sodium: 134 mEq/L — ABNORMAL LOW (ref 135–145)

## 2013-01-20 LAB — HEPATIC FUNCTION PANEL
ALT: 24 U/L (ref 0–35)
Total Bilirubin: 0.4 mg/dL (ref 0.3–1.2)

## 2013-01-20 LAB — LIPID PANEL
HDL: 61.1 mg/dL (ref >39.00)
LDL Cholesterol: 95 mg/dL (ref 0–99)
VLDL: 13 mg/dL (ref 0.0–40.0)

## 2013-01-20 LAB — HEMOGLOBIN A1C: Hgb A1c MFr Bld: 7.4 % — ABNORMAL HIGH (ref 4.6–6.5)

## 2013-01-21 ENCOUNTER — Other Ambulatory Visit: Payer: Self-pay | Admitting: Internal Medicine

## 2013-01-21 DIAGNOSIS — E871 Hypo-osmolality and hyponatremia: Secondary | ICD-10-CM

## 2013-01-21 NOTE — Progress Notes (Signed)
Order placed for f/u sodium.  ?

## 2013-01-24 ENCOUNTER — Encounter: Payer: Self-pay | Admitting: Internal Medicine

## 2013-01-24 NOTE — Assessment & Plan Note (Signed)
On synthroid.  Follow tsh.   

## 2013-01-24 NOTE — Assessment & Plan Note (Signed)
Blood pressure under good control.  Same medication.  Follow metabolic panel.   

## 2013-01-24 NOTE — Assessment & Plan Note (Signed)
Follow sodium.  

## 2013-01-24 NOTE — Progress Notes (Signed)
Subjective:    Patient ID: Michelle Hawkins, female    DOB: [birth year], 67 y.o.   MRN: [id number]  HPI 67 year old female with past history of breast cancer (s/p chemotherapy/XRT), hypertension, diabetes and hypercholesterolemia who comes in today for a scheduled follow up.  She states she is doing better.  She is watching what she eats now.  Back at [employer].  Some residual right leg issues that limits her with some of her exercise.  No chest pain or tightness.  Breathing stable.  No acid reflux.  no nausea or vomiting.  No bowel change.  Back on Lexapro.  Feels better.  Handling stress well.      Past Medical History  Diagnosis Date  . Breast cancer 1996    s/p lumpectomy (lymph node dissection - 2/11 positive), chemotherapy  . Diabetes mellitus   . H/O ulcer disease     PUD  . GERD (gastroesophageal reflux disease)   . Hypercholesterolemia   . Nephrolithiasis   . Hypothyroidism     multinodular goiter  . Scoliosis   . Dysphagia   . SVT (supraventricular tachycardia)   . Allergic rhinitis     Current Outpatient Prescriptions on File Prior to Visit  Medication Sig Dispense Refill  . aspirin 81 MG tablet Take 81 mg by mouth daily.      Marland Kitchen atorvastatin (LIPITOR) 20 MG tablet Take 1 tablet (20 mg total) by mouth daily.  90 tablet  3  . Biotin (BIOTIN 5000) 5 MG CAPS Take by mouth.      . Blood Glucose Monitoring Suppl (ONE TOUCH ULTRA SYSTEM KIT) W/DEVICE KIT 1 kit by Does not apply route once.  1 each  0  . calcium carbonate 200 MG capsule Take 250 mg by mouth 2 (two) times daily with a meal.      . cholecalciferol (VITAMIN D) 400 UNITS TABS Take by mouth.      . escitalopram (LEXAPRO) 20 MG tablet Take 1 tablet (20 mg total) by mouth daily.  90 tablet  1  . fish oil-omega-3 fatty acids 1000 MG capsule Take 2 g by mouth daily.      Marland Kitchen glucose blood (ONE TOUCH ULTRA TEST) test strip One touch ultra II test strips Check blood sugar bid  Dx 250.00  100 each  12  . levothyroxine  (SYNTHROID, LEVOTHROID) 25 MCG tablet Take 1 tablet (25 mcg total) by mouth daily.  14 tablet  0  . LORazepam (ATIVAN) 0.5 MG tablet 1/2 tablet q day prn  30 tablet  1  . metFORMIN (GLUCOPHAGE) 500 MG tablet Take two tablets bid  360 tablet  3  . metoprolol (LOPRESSOR) 50 MG tablet Take 1 tablet (50 mg total) by mouth 2 (two) times daily.  180 tablet  3  . pantoprazole (PROTONIX) 40 MG tablet Take 1 tablet (40 mg total) by mouth 2 (two) times daily.  180 tablet  1  . ramipril (ALTACE) 10 MG capsule Take 1 capsule (10 mg total) by mouth daily.  90 capsule  3  . TURMERIC PO Take by mouth daily.        No current facility-administered medications on file prior to visit.    Review of Systems Patient denies any headache, lightheadedness or dizziness.  No chest pain, tightness or palpitations. No increased shortness of breath, cough or congestion.  No nausea or vomiting.  Acid reflux controlled on her current med regimen.   No abdominal pain or cramping.  No  bowel change, such as diarrhea, constipation, BRBPR or melana.  No urine change.  Doing better with her diet.  Sugars better.  See her attached list.  AM sugars averaging 92-120 and PM sugars averaging 90-130s.       Objective:   Physical Exam  Filed Vitals:   01/20/13 0924  BP: 130/70  Pulse: 59  Temp: 98.1 F (36.7 C)   Pulse recheck;  69  67 year old female in no acute distress.   HEENT:  Nares- clear.  Oropharynx - without lesions. NECK:  Supple.  Nontender.  No audible bruit.  HEART:  Appears to be regular. LUNGS:  No crackles or wheezing audible.  Respirations even and unlabored.  RADIAL PULSE:  Equal bilaterally.  ABDOMEN:  Soft, nontender.  Bowel sounds present and normal.  No audible abdominal bruit.    EXTREMITIES:  No increased edema present.  DP pulses palpable and equal bilaterally.           Assessment & Plan:  INCREASED STRESS/ANXIETY.  Back on Lexapro.  Feels better.  Doing well.  Follow.   ANEMIA.  Follow cbc  and ferritin.  Last check wnl.   ABNORMAL MAMMOGRAM.  Referred to Dr Lemar Livings.  S/p biopsy.  Everything checked out fine.      HEALTH MAINTENANCE.  Physical 07/07/12.  She is s/p hysterectomy.   Mammogram 09/23/11 BiRADS II.  Most recent mammo - Birads 4.  Referred to Dr Lemar Livings.  S/p biopsy.  Everything checked out fine.  Last colonoscopy 10/14/12 revealed internal hemorrhoids otherwise normal.  Recommended follow up colonoscopy in 10 years.

## 2013-01-24 NOTE — Assessment & Plan Note (Signed)
Symptoms controlled.  Continue prevacid.

## 2013-01-24 NOTE — Assessment & Plan Note (Signed)
On lipitor.  Low cholesterol diet and exercise.   Follow lipid panel and liver function.   

## 2013-01-24 NOTE — Assessment & Plan Note (Signed)
Previously saw Dr Lemar Livings and Dr Doylene Canning.  Had been released.  Mammogram 09/13/11 - BiRADS II.  Recent abnormal mammogram.  Saw Dr Lemar Livings.  Biopsy ok.

## 2013-01-24 NOTE — Assessment & Plan Note (Signed)
Sugar as outlined.  Better.  Continues diet adjustment.  Check metabolic panel and a1c.

## 2013-02-06 ENCOUNTER — Other Ambulatory Visit: Payer: Self-pay | Admitting: *Deleted

## 2013-02-06 MED ORDER — ESCITALOPRAM OXALATE 20 MG PO TABS: 20.0000 mg | ORAL_TABLET | Freq: Every day | ORAL | Status: DC

## 2013-02-23 ENCOUNTER — Other Ambulatory Visit: Payer: Medicare Other

## 2013-02-23 ENCOUNTER — Other Ambulatory Visit (INDEPENDENT_AMBULATORY_CARE_PROVIDER_SITE_OTHER): Payer: Medicare Other

## 2013-02-23 DIAGNOSIS — E871 Hypo-osmolality and hyponatremia: Secondary | ICD-10-CM

## 2013-02-24 ENCOUNTER — Other Ambulatory Visit: Payer: Self-pay | Admitting: Internal Medicine

## 2013-02-24 DIAGNOSIS — E871 Hypo-osmolality and hyponatremia: Secondary | ICD-10-CM

## 2013-02-24 NOTE — Progress Notes (Signed)
Order placed for f/u sodium check.   

## 2013-02-26 ENCOUNTER — Encounter: Payer: Self-pay | Admitting: *Deleted

## 2013-02-26 ENCOUNTER — Other Ambulatory Visit (INDEPENDENT_AMBULATORY_CARE_PROVIDER_SITE_OTHER): Payer: Medicare Other

## 2013-02-26 DIAGNOSIS — E871 Hypo-osmolality and hyponatremia: Secondary | ICD-10-CM

## 2013-03-01 ENCOUNTER — Other Ambulatory Visit: Payer: Self-pay | Admitting: Internal Medicine

## 2013-03-01 DIAGNOSIS — E871 Hypo-osmolality and hyponatremia: Secondary | ICD-10-CM

## 2013-03-01 NOTE — Progress Notes (Signed)
Order placed for f/u sodium.  ?

## 2013-03-04 ENCOUNTER — Encounter: Payer: Self-pay | Admitting: *Deleted

## 2013-03-24 ENCOUNTER — Telehealth: Payer: Self-pay | Admitting: Internal Medicine

## 2013-03-24 NOTE — Telephone Encounter (Signed)
Would like chart noted that all billing for July and August from [place] was not paid by his insurance, stating pt had other insurance listed as primary.  Pt is aware that we did bill correctly from [facility].  States insurance has gotten their issue corrected and the pt just wants note made to her chart of this.

## 2013-03-30 ENCOUNTER — Other Ambulatory Visit (INDEPENDENT_AMBULATORY_CARE_PROVIDER_SITE_OTHER): Payer: Medicare Other

## 2013-03-30 ENCOUNTER — Telehealth: Payer: Self-pay | Admitting: Internal Medicine

## 2013-03-30 DIAGNOSIS — E871 Hypo-osmolality and hyponatremia: Secondary | ICD-10-CM

## 2013-03-30 LAB — SODIUM: Sodium: 133 mEq/L — ABNORMAL LOW (ref 135–145)

## 2013-03-30 NOTE — Telephone Encounter (Signed)
Pt stated she has been taking lexapro pt stated this says it can cause low sodium.   Pt stated she was given a rx for pantoprazole this is not working for her Checking on pneumonia shot  Does she need to take this  Waiting on her records from [facility]

## 2013-03-30 NOTE — Telephone Encounter (Signed)
Pt notified & in regards to her Protonix- "it just doesn't work". Pt notified that we will be back in touch soon with her lab results

## 2013-03-30 NOTE — Telephone Encounter (Signed)
Regarding the Lexapro, she has been on this previously and had not had an issue with her sodium.  Let me see how her level looks with this lab draw.  Hold on changing lexapro at this time.  I do not have the date from [facility] - when her last pneumonia shot.  She may be able to call and get this information or can sign another form for me to send over.  Regarding the "protonix not working" - need to know what symptoms she is having now.

## 2013-03-31 NOTE — Telephone Encounter (Signed)
Please notify pt that her sodium is stable (actually slightly improved).  We will continue to follow.  If she is having persistent problems and the protonix "not working" - I can see her 04/01/13 at 4:00 to evaluate further.

## 2013-03-31 NOTE — Telephone Encounter (Signed)
LMTCB

## 2013-04-01 ENCOUNTER — Encounter: Payer: Self-pay | Admitting: Internal Medicine

## 2013-04-01 ENCOUNTER — Ambulatory Visit (INDEPENDENT_AMBULATORY_CARE_PROVIDER_SITE_OTHER): Payer: Medicare Other | Admitting: Internal Medicine

## 2013-04-01 VITALS — BP 100/60 | HR 64 | Temp 98.3°F | Wt 147.0 lb

## 2013-04-01 DIAGNOSIS — Z23 Encounter for immunization: Secondary | ICD-10-CM

## 2013-04-01 DIAGNOSIS — K219 Gastro-esophageal reflux disease without esophagitis: Secondary | ICD-10-CM

## 2013-04-01 DIAGNOSIS — E871 Hypo-osmolality and hyponatremia: Secondary | ICD-10-CM

## 2013-04-01 DIAGNOSIS — E119 Type 2 diabetes mellitus without complications: Secondary | ICD-10-CM

## 2013-04-01 DIAGNOSIS — E039 Hypothyroidism, unspecified: Secondary | ICD-10-CM

## 2013-04-01 MED ORDER — LANSOPRAZOLE 30 MG PO CPDR: 30.0000 mg | DELAYED_RELEASE_CAPSULE | Freq: Two times a day (BID) | ORAL | Status: DC

## 2013-04-01 NOTE — Telephone Encounter (Signed)
Pt notified of lab results & coming in today at 4:00 to further evaluate reflux issues

## 2013-04-01 NOTE — Patient Instructions (Signed)
Prevacid (lansoprazole) 30mg  - one capsule two times per day.    Can also take zantac (ranitidine) 150mg  - one per day if needed.

## 2013-04-04 ENCOUNTER — Encounter: Payer: Self-pay | Admitting: Internal Medicine

## 2013-04-04 NOTE — Assessment & Plan Note (Signed)
On synthroid.  Follow tsh.  Last tsh wnl.     

## 2013-04-04 NOTE — Assessment & Plan Note (Signed)
Sugars has been better.  Continue diet adjustment.  Follow metabolic panel and a1c.

## 2013-04-04 NOTE — Progress Notes (Signed)
Subjective:    Patient ID: Michelle Hawkins, female    DOB: 11-09-1945, 67 y.o.   MRN: [id number]  HPI 67 year old female with past history of breast cancer (s/p chemotherapy/XRT), hypertension, diabetes and hypercholesterolemia who comes in today as a work in to discuss continued treatment of her lexapro.  She also wanted to discuss her persistent acid reflux.  She was questioning if the Lexapro, could be contributing to her sodium being low.  Feels the lexapro is helping with her anxiety/stress.  Also reported that since she has been on the protonix, her reflux has been worse.  Not controlled.  Felt prevacid worked better.  Increased acid reflux now.  No vomiting.  No dysphagia.  No bowel change.    Past Medical History  Diagnosis Date  . Breast cancer 1996    s/p lumpectomy (lymph node dissection - 2/11 positive), chemotherapy  . Diabetes mellitus   . H/O ulcer disease     PUD  . GERD (gastroesophageal reflux disease)   . Hypercholesterolemia   . Nephrolithiasis   . Hypothyroidism     multinodular goiter  . Scoliosis   . Dysphagia   . SVT (supraventricular tachycardia)   . Allergic rhinitis     Current Outpatient Prescriptions on File Prior to Visit  Medication Sig Dispense Refill  . aspirin 81 MG tablet Take 81 mg by mouth 2 (two) times daily.       Marland Kitchen atorvastatin (LIPITOR) 20 MG tablet Take 1 tablet (20 mg total) by mouth daily.  90 tablet  3  . Biotin (BIOTIN 5000) 5 MG CAPS Take by mouth.      . Blood Glucose Monitoring Suppl (ONE TOUCH ULTRA SYSTEM KIT) W/DEVICE KIT 1 kit by Does not apply route once.  1 each  0  . calcium carbonate 200 MG capsule Take 250 mg by mouth 2 (two) times daily with a meal.      . cholecalciferol (VITAMIN D) 400 UNITS TABS Take by mouth.      . escitalopram (LEXAPRO) 20 MG tablet Take 1 tablet (20 mg total) by mouth daily.  90 tablet  0  . fish oil-omega-3 fatty acids 1000 MG capsule Take 2 g by mouth daily.      Marland Kitchen glucose blood (ONE TOUCH ULTRA  TEST) test strip One touch ultra II test strips Check blood sugar bid  Dx 250.00  100 each  12  . levothyroxine (SYNTHROID, LEVOTHROID) 25 MCG tablet Take 1 tablet (25 mcg total) by mouth daily.  14 tablet  0  . LORazepam (ATIVAN) 0.5 MG tablet 1/2 tablet q day prn  30 tablet  1  . metFORMIN (GLUCOPHAGE) 500 MG tablet Take two tablets bid  360 tablet  3  . metoprolol (LOPRESSOR) 50 MG tablet Take 1 tablet (50 mg total) by mouth 2 (two) times daily.  180 tablet  3  . ramipril (ALTACE) 10 MG capsule Take 1 capsule (10 mg total) by mouth daily.  90 capsule  3  . TURMERIC PO Take by mouth daily.        No current facility-administered medications on file prior to visit.    Review of Systems Patient denies any headache, lightheadedness or dizziness.  No chest pain, tightness or palpitations. No increased shortness of breath, cough or congestion.  No nausea or vomiting.  Worsening acid reflux as outlined.  No abdominal pain or cramping.  No bowel change, such as diarrhea, constipation, BRBPR or melana.  No urine change.  Doing better with her diet.  Sugars better.  Doing well on lexapro.      Objective:   Physical Exam  Filed Vitals:   04/01/13 1610  BP: 100/60  Pulse: 64  Temp: 98.3 F (77.15 C)   67 year old female in no acute distress.   HEENT:  Nares- clear.  Oropharynx - without lesions. NECK:  Supple.  Nontender.  No audible bruit.  HEART:  Appears to be regular. LUNGS:  No crackles or wheezing audible.  Respirations even and unlabored.  RADIAL PULSE:  Equal bilaterally.  ABDOMEN:  Soft, nontender.  Bowel sounds present and normal.  No audible abdominal bruit.    EXTREMITIES:  No increased edema present.  DP pulses palpable and equal bilaterally.           Assessment & Plan:  INCREASED STRESS/ANXIETY.  On lexapro.  Doing better since being back on lexapro.  Sodium only slightly decreased.  Will continue lexapro for now.  Follow.  Follow sodium.   ANEMIA.  Follow cbc and  ferritin.  Last check wnl.   ABNORMAL MAMMOGRAM.  Referred to Dr Lemar Livings.  S/p biopsy.  Everything checked out fine.      HEALTH MAINTENANCE.  Physical 07/07/12.  She is s/p hysterectomy.   Mammogram 09/23/11 BiRADS II.  Most recent mammo - Birads 4.  Referred to Dr Lemar Livings.  S/p biopsy.  Everything checked out fine.  Last colonoscopy 10/14/12 revealed internal hemorrhoids otherwise normal.  Recommended follow up colonoscopy in 10 years.

## 2013-04-04 NOTE — Assessment & Plan Note (Signed)
Last sodium stable (slightly improved).  Will continue lexapro for now.  Follow.

## 2013-04-04 NOTE — Assessment & Plan Note (Signed)
Worsening acid reflux on protonix.  Prevacid controlled her symptoms.  Since being on protonix, acid reflux not controlled.  Will restart prevacid.  Follow.

## 2013-04-06 ENCOUNTER — Encounter: Payer: Self-pay | Admitting: Internal Medicine

## 2013-04-09 ENCOUNTER — Other Ambulatory Visit: Payer: Self-pay | Admitting: *Deleted

## 2013-04-09 MED ORDER — ESCITALOPRAM OXALATE 20 MG PO TABS: 20.0000 mg | ORAL_TABLET | Freq: Every day | ORAL | Status: DC

## 2013-04-09 NOTE — Telephone Encounter (Signed)
Okay to refill? Last filled in August (#90)

## 2013-04-09 NOTE — Telephone Encounter (Signed)
Refilled #90 with one refill 

## 2013-04-15 ENCOUNTER — Other Ambulatory Visit: Payer: Self-pay | Admitting: *Deleted

## 2013-04-15 MED ORDER — RAMIPRIL 10 MG PO CAPS: 10.0000 mg | ORAL_CAPSULE | Freq: Every day | ORAL | Status: DC

## 2013-04-15 MED ORDER — ESCITALOPRAM OXALATE 20 MG PO TABS: 20.0000 mg | ORAL_TABLET | Freq: Every day | ORAL | Status: DC

## 2013-04-21 ENCOUNTER — Other Ambulatory Visit: Payer: Self-pay | Admitting: *Deleted

## 2013-04-21 MED ORDER — LEVOTHYROXINE SODIUM 25 MCG PO TABS: 25.0000 ug | ORAL_TABLET | Freq: Every day | ORAL | Status: DC

## 2013-04-22 ENCOUNTER — Other Ambulatory Visit (INDEPENDENT_AMBULATORY_CARE_PROVIDER_SITE_OTHER): Payer: Medicare Other

## 2013-04-22 ENCOUNTER — Telehealth: Payer: Self-pay | Admitting: *Deleted

## 2013-04-22 DIAGNOSIS — E871 Hypo-osmolality and hyponatremia: Secondary | ICD-10-CM

## 2013-04-22 DIAGNOSIS — I10 Essential (primary) hypertension: Secondary | ICD-10-CM

## 2013-04-22 DIAGNOSIS — E7800 Pure hypercholesterolemia, unspecified: Secondary | ICD-10-CM

## 2013-04-22 DIAGNOSIS — E039 Hypothyroidism, unspecified: Secondary | ICD-10-CM

## 2013-04-22 DIAGNOSIS — E119 Type 2 diabetes mellitus without complications: Secondary | ICD-10-CM

## 2013-04-22 NOTE — Telephone Encounter (Signed)
What labs and dX?  

## 2013-04-23 LAB — HEPATIC FUNCTION PANEL: Total Bilirubin: 0.4 mg/dL (ref 0.3–1.2)

## 2013-04-23 LAB — LIPID PANEL
Cholesterol: 178 mg/dL (ref 0–200)
Total CHOL/HDL Ratio: 3
Triglycerides: 100 mg/dL (ref 0.0–149.0)
VLDL: 20 mg/dL (ref 0.0–40.0)

## 2013-04-23 LAB — HEMOGLOBIN A1C: Hgb A1c MFr Bld: 7.4 % — ABNORMAL HIGH (ref 4.6–6.5)

## 2013-04-23 LAB — BASIC METABOLIC PANEL
BUN: 9 mg/dL (ref 6–23)
Calcium: 9.6 mg/dL (ref 8.4–10.5)
Creatinine, Ser: 0.7 mg/dL (ref 0.4–1.2)
Sodium: 135 mEq/L (ref 135–145)

## 2013-04-23 LAB — TSH: TSH: 1.35 u[IU]/mL (ref 0.35–5.50)

## 2013-04-23 NOTE — Telephone Encounter (Signed)
Orders placed for lab

## 2013-04-24 ENCOUNTER — Ambulatory Visit (INDEPENDENT_AMBULATORY_CARE_PROVIDER_SITE_OTHER): Payer: Medicare Other | Admitting: Internal Medicine

## 2013-04-24 ENCOUNTER — Encounter: Payer: Self-pay | Admitting: Internal Medicine

## 2013-04-24 ENCOUNTER — Ambulatory Visit (INDEPENDENT_AMBULATORY_CARE_PROVIDER_SITE_OTHER)
Admission: RE | Admit: 2013-04-24 | Discharge: 2013-04-24 | Disposition: A | Discharge status: Home / Self Care | Payer: Medicare Other | Source: Ambulatory Visit | Attending: Internal Medicine | Admitting: Internal Medicine

## 2013-04-24 VITALS — BP 112/70 | HR 58 | Temp 98.3°F | Ht 66.0 in | Wt 146.8 lb

## 2013-04-24 DIAGNOSIS — E871 Hypo-osmolality and hyponatremia: Secondary | ICD-10-CM

## 2013-04-24 DIAGNOSIS — I10 Essential (primary) hypertension: Secondary | ICD-10-CM

## 2013-04-24 DIAGNOSIS — E7800 Pure hypercholesterolemia, unspecified: Secondary | ICD-10-CM

## 2013-04-24 DIAGNOSIS — E119 Type 2 diabetes mellitus without complications: Secondary | ICD-10-CM

## 2013-04-24 DIAGNOSIS — C50919 Malignant neoplasm of unspecified site of unspecified female breast: Secondary | ICD-10-CM

## 2013-04-24 DIAGNOSIS — K219 Gastro-esophageal reflux disease without esophagitis: Secondary | ICD-10-CM

## 2013-04-24 DIAGNOSIS — R062 Wheezing: Secondary | ICD-10-CM

## 2013-04-24 DIAGNOSIS — E039 Hypothyroidism, unspecified: Secondary | ICD-10-CM

## 2013-04-24 LAB — HM DIABETES FOOT EXAM

## 2013-04-24 MED ORDER — ALBUTEROL SULFATE HFA 108 (90 BASE) MCG/ACT IN AERS: 2.0000 | INHALATION_SPRAY | Freq: Four times a day (QID) | RESPIRATORY_TRACT | Status: DC | PRN

## 2013-04-24 MED ORDER — CEFDINIR 300 MG PO CAPS: 300.0000 mg | ORAL_CAPSULE | Freq: Two times a day (BID) | ORAL | Status: DC

## 2013-04-24 NOTE — Patient Instructions (Signed)
Robitussin as directed.  Saline nasal spray - flush nose at least 3-4x/day

## 2013-04-26 ENCOUNTER — Encounter: Payer: Self-pay | Admitting: Internal Medicine

## 2013-04-26 NOTE — Assessment & Plan Note (Signed)
Worsening acid reflux on protonix.  Prevacid controlled her symptoms.  Was placed back on prevacid last visit.  No increased acid reflux reported today.

## 2013-04-26 NOTE — Assessment & Plan Note (Signed)
On lipitor.  Low cholesterol diet and exercise.   Follow lipid panel and liver function.   

## 2013-04-26 NOTE — Assessment & Plan Note (Signed)
Previously saw Dr Lemar Livings and Dr Doylene Canning.  Had been released.  Mammogram 09/13/11 - BiRADS II.  Recent abnormal mammogram.  Saw Dr Lemar Livings.  Biopsy ok.

## 2013-04-26 NOTE — Assessment & Plan Note (Signed)
Sodium just checked and wnl.   

## 2013-04-26 NOTE — Assessment & Plan Note (Signed)
Blood pressure under good control.  Same medication.  Follow metabolic panel.   

## 2013-04-26 NOTE — Assessment & Plan Note (Signed)
Sugars as outlined.  Brought in no recorded readings.  Discussed recent a1c (7.4).  Discussed additional medication.  She declines.  Discussed diet and exercise.   Continue diet adjustment.  Follow sugars.  Up to date with eye checks.

## 2013-04-26 NOTE — Assessment & Plan Note (Signed)
On synthroid.  Follow tsh.  Last tsh wnl.     

## 2013-04-26 NOTE — Progress Notes (Signed)
Subjective:    Patient ID: Michelle Hawkins, female    DOB: 1946/04/11, 67 y.o.   MRN: [id number]  HPI 67 year old female with past history of breast cancer (s/p chemotherapy/XRT), hypertension, diabetes and hypercholesterolemia who comes in today for a scheduled follow up.    Feels the lexapro is helping with her anxiety/stress.  Recent sodium normal.  a1c recently checked and elevated at 7.4.  No improvement from last check.  Discussed at length with her today.  Discussed additional medication.  She is on metformin.  She declines additional medication at this time.  Wants to work on diet and exercise.  She does report increased drainage and cough.  Colored, thick mucus.  No fever.  Some increased chest congestion.  Persistent.  Started taking amoxicillin (of her husbands) several days ago.  No chest pain or tightness.  No increased acid reflux reported today.   No vomiting.  No dysphagia.  No bowel change.    Past Medical History  Diagnosis Date  . Breast cancer 1996    s/p lumpectomy (lymph node dissection - 2/11 positive), chemotherapy  . Diabetes mellitus   . H/O ulcer disease     PUD  . GERD (gastroesophageal reflux disease)   . Hypercholesterolemia   . Nephrolithiasis   . Hypothyroidism     multinodular goiter  . Scoliosis   . Dysphagia   . SVT (supraventricular tachycardia)   . Allergic rhinitis     Current Outpatient Prescriptions on File Prior to Visit  Medication Sig Dispense Refill  . aspirin 81 MG tablet Take 81 mg by mouth 2 (two) times daily.       Marland Kitchen atorvastatin (LIPITOR) 20 MG tablet Take 1 tablet (20 mg total) by mouth daily.  90 tablet  3  . Biotin (BIOTIN 5000) 5 MG CAPS Take by mouth.      . Blood Glucose Monitoring Suppl (ONE TOUCH ULTRA SYSTEM KIT) W/DEVICE KIT 1 kit by Does not apply route once.  1 each  0  . calcium carbonate 200 MG capsule Take 250 mg by mouth 2 (two) times daily with a meal.      . cholecalciferol (VITAMIN D) 400 UNITS TABS Take by mouth.       . escitalopram (LEXAPRO) 20 MG tablet Take 1 tablet (20 mg total) by mouth daily.  90 tablet  0  . fish oil-omega-3 fatty acids 1000 MG capsule Take 2 g by mouth daily.      Marland Kitchen glucose blood (ONE TOUCH ULTRA TEST) test strip One touch ultra II test strips Check blood sugar bid  Dx 250.00  100 each  12  . lansoprazole (PREVACID) 30 MG capsule Take 1 capsule (30 mg total) by mouth 2 (two) times daily.  60 capsule  1  . levothyroxine (SYNTHROID, LEVOTHROID) 25 MCG tablet Take 1 tablet (25 mcg total) by mouth daily.  90 tablet  0  . metFORMIN (GLUCOPHAGE) 500 MG tablet Take two tablets bid  360 tablet  3  . metoprolol (LOPRESSOR) 50 MG tablet Take 1 tablet (50 mg total) by mouth 2 (two) times daily.  180 tablet  3  . mupirocin ointment (BACTROBAN) 2 %       . ramipril (ALTACE) 10 MG capsule Take 1 capsule (10 mg total) by mouth daily.  90 capsule  1  . TURMERIC PO Take by mouth daily.        No current facility-administered medications on file prior to visit.  Review of Systems Patient denies any headache, lightheadedness or dizziness. Congestion and drainage as outlined.  No chest pain, tightness or palpitations. No increased shortness of breath.  Does report chest congestion and cough.   No nausea or vomiting.  No increased acid reflux reported.   No abdominal pain or cramping.  No bowel change, such as diarrhea, constipation, BRBPR or melana.  No urine change.   Doing well on lexapro.  She brought in no recorded sugar readings.  States AM sugars averaging low 100s and PM sugar was 179 last check.       Objective:   Physical Exam  Filed Vitals:   04/24/13 1128  BP: 112/70  Pulse: 58  Temp: 98.3 F (67.86 C)   67 year old female in no acute distress.   HEENT:  Nares- slightly erythematous turbinates.  Oropharynx - without lesions. NECK:  Supple.  Nontender.  No audible bruit.  HEART:  Appears to be regular. LUNGS:  No crackles or wheezing audible.  Respirations even and unlabored.   Some increased congestion - clears some with coughing.   RADIAL PULSE:  Equal bilaterally.  ABDOMEN:  Soft, nontender.  Bowel sounds present and normal.  No audible abdominal bruit.    EXTREMITIES:  No increased edema present.  DP pulses palpable and equal bilaterally.  FEET:  Without lesions.            Assessment & Plan:  INCREASED STRESS/ANXIETY.  On lexapro.  Doing better since being back on lexapro.  Sodium just checked and normal.    ANEMIA.  Follow cbc and ferritin.  Last check wnl.   ABNORMAL MAMMOGRAM.  Referred to Dr Lemar Livings.  S/p biopsy.  Everything checked out fine.      HEALTH MAINTENANCE.  Physical 07/07/12.  She is s/p hysterectomy.   Mammogram 09/23/11 BiRADS II.  Most recent mammo - Birads 4.  Referred to Dr Lemar Livings.  S/p biopsy.  Everything checked out fine.  Last colonoscopy 10/14/12 revealed internal hemorrhoids otherwise normal.  Recommended follow up colonoscopy in 10 years.

## 2013-04-27 ENCOUNTER — Other Ambulatory Visit: Payer: Self-pay | Admitting: Internal Medicine

## 2013-04-27 ENCOUNTER — Telehealth: Payer: Self-pay | Admitting: Internal Medicine

## 2013-04-27 DIAGNOSIS — I517 Cardiomegaly: Secondary | ICD-10-CM

## 2013-04-27 NOTE — Telephone Encounter (Signed)
Pt calling adn wanting to know about Chest X-ray she had done on Friday. She thought she may have pneumonia and she was told she would get results late Friday or early this morning and she has not heard anything yet ??

## 2013-04-27 NOTE — Progress Notes (Signed)
Order placed for echo.

## 2013-04-27 NOTE — Telephone Encounter (Signed)
Echo scheduled with [facility], [address] [place]. 05/05/13 @ 11am. Arriving @ 1045am.

## 2013-04-27 NOTE — Telephone Encounter (Signed)
Pt notified of cxr results.  Will schedule echo.

## 2013-05-05 ENCOUNTER — Ambulatory Visit (INDEPENDENT_AMBULATORY_CARE_PROVIDER_SITE_OTHER): Payer: Medicare Other | Admitting: Cardiology

## 2013-05-05 DIAGNOSIS — I059 Rheumatic mitral valve disease, unspecified: Secondary | ICD-10-CM

## 2013-05-05 DIAGNOSIS — I369 Nonrheumatic tricuspid valve disorder, unspecified: Secondary | ICD-10-CM

## 2013-05-05 DIAGNOSIS — R0600 Dyspnea, unspecified: Secondary | ICD-10-CM

## 2013-05-05 DIAGNOSIS — R0602 Shortness of breath: Secondary | ICD-10-CM

## 2013-05-08 ENCOUNTER — Ambulatory Visit (INDEPENDENT_AMBULATORY_CARE_PROVIDER_SITE_OTHER): Payer: Medicare Other | Admitting: Adult Health

## 2013-05-08 ENCOUNTER — Encounter: Payer: Self-pay | Admitting: Adult Health

## 2013-05-08 VITALS — BP 106/56 | HR 60 | Temp 98.2°F | Resp 12 | Wt 148.5 lb

## 2013-05-08 DIAGNOSIS — R062 Wheezing: Secondary | ICD-10-CM

## 2013-05-08 MED ORDER — ALBUTEROL SULFATE (2.5 MG/3ML) 0.083% IN NEBU
2.5000 mg | INHALATION_SOLUTION | Freq: Once | RESPIRATORY_TRACT | Status: AC
  Administered 2013-05-08: 2.5 mg via RESPIRATORY_TRACT

## 2013-05-08 MED ORDER — PREDNISONE 10 MG PO TABS: ORAL_TABLET | ORAL | Status: DC

## 2013-05-08 MED ORDER — AZITHROMYCIN 250 MG PO TABS: ORAL_TABLET | ORAL | Status: DC

## 2013-05-08 NOTE — Progress Notes (Signed)
Subjective:    Patient ID: Michelle Hawkins, female    DOB: Jul 21, 1945, 67 y.o.   MRN: [id number]  HPI  Patient presents with ongoing cough. She is s/p antibiotic and OTC cough medication.  She was recently seen in clinic on 04/24/13 for c/o increased drainage and cough, thick mucus. She reports feeling improved but cough ongoing. Still no fever. She reports chest tightness. Does not use an inhaler.   Current Outpatient Prescriptions on File Prior to Visit  Medication Sig Dispense Refill  . albuterol (PROVENTIL HFA;VENTOLIN HFA) 108 (90 BASE) MCG/ACT inhaler Inhale 2 puffs into the lungs every 6 (six) hours as needed for wheezing.  1 Inhaler  0  . aspirin 81 MG tablet Take 81 mg by mouth 2 (two) times daily.       Marland Kitchen atorvastatin (LIPITOR) 20 MG tablet Take 1 tablet (20 mg total) by mouth daily.  90 tablet  3  . Biotin (BIOTIN 5000) 5 MG CAPS Take by mouth.      . Blood Glucose Monitoring Suppl (ONE TOUCH ULTRA SYSTEM KIT) W/DEVICE KIT 1 kit by Does not apply route once.  1 each  0  . calcium carbonate 200 MG capsule Take 250 mg by mouth 2 (two) times daily with a meal.      . cholecalciferol (VITAMIN D) 400 UNITS TABS Take by mouth.      . escitalopram (LEXAPRO) 20 MG tablet Take 1 tablet (20 mg total) by mouth daily.  90 tablet  0  . fish oil-omega-3 fatty acids 1000 MG capsule Take 2 g by mouth daily.      Marland Kitchen glucose blood (ONE TOUCH ULTRA TEST) test strip One touch ultra II test strips Check blood sugar bid  Dx 250.00  100 each  12  . lansoprazole (PREVACID) 30 MG capsule Take 1 capsule (30 mg total) by mouth 2 (two) times daily.  60 capsule  1  . levothyroxine (SYNTHROID, LEVOTHROID) 25 MCG tablet Take 1 tablet (25 mcg total) by mouth daily.  90 tablet  0  . metFORMIN (GLUCOPHAGE) 500 MG tablet Take two tablets bid  360 tablet  3  . metoprolol (LOPRESSOR) 50 MG tablet Take 1 tablet (50 mg total) by mouth 2 (two) times daily.  180 tablet  3  . mupirocin ointment (BACTROBAN) 2 %       .  ramipril (ALTACE) 10 MG capsule Take 1 capsule (10 mg total) by mouth daily.  90 capsule  1  . TURMERIC PO Take by mouth daily.        No current facility-administered medications on file prior to visit.      Review of Systems  Constitutional: Negative for fever and chills.  HENT: Positive for congestion and postnasal drip. Negative for sore throat.   Respiratory: Positive for cough, chest tightness and wheezing. Negative for shortness of breath.   Cardiovascular: Negative.        Objective:   Physical Exam  Constitutional: She is oriented to person, place, and time. She appears well-developed and well-nourished. No distress.  Cardiovascular: Normal rate and regular rhythm.  Exam reveals no gallop.   No murmur heard. Pulmonary/Chest: Effort normal. No respiratory distress. She has wheezes. She has no rales.  Scattered rhonchi  Neurological: She is alert and oriented to person, place, and time.  Skin: Skin is dry.  Psychiatric: She has a normal mood and affect. Her behavior is normal. Judgment and thought content normal.    BP 106/56  Pulse  60  Temp(Src) 98.2 F (36.8 C) (Oral)  Resp 12  Wt 148 lb 8 oz (67.359 kg)  BMI 23.98 kg/m2  SpO2 94%       Assessment & Plan:

## 2013-05-08 NOTE — Patient Instructions (Signed)
  Azithromycin - Take 2 tablets today and then 1 tablet daily for the next 4 days.  Prednisone taper:  Take 6 tablets on the first day then decrease by 1 tablet daily until done.  6,5,4,3,2,1,0  Continue over the counter cough medicine.

## 2013-05-09 NOTE — Assessment & Plan Note (Signed)
Nebulizer treatment with albuterol given in office. Start Azithromycin and prednisone taper. RTC if no improvement within 3-4 days.  

## 2013-06-09 ENCOUNTER — Other Ambulatory Visit: Payer: Self-pay | Admitting: *Deleted

## 2013-06-09 MED ORDER — ATORVASTATIN CALCIUM 20 MG PO TABS: 20.0000 mg | ORAL_TABLET | Freq: Every day | ORAL | Status: DC

## 2013-07-07 ENCOUNTER — Other Ambulatory Visit (INDEPENDENT_AMBULATORY_CARE_PROVIDER_SITE_OTHER): Payer: Medicare Other

## 2013-07-07 DIAGNOSIS — I10 Essential (primary) hypertension: Secondary | ICD-10-CM

## 2013-07-07 DIAGNOSIS — E7800 Pure hypercholesterolemia, unspecified: Secondary | ICD-10-CM

## 2013-07-07 DIAGNOSIS — E119 Type 2 diabetes mellitus without complications: Secondary | ICD-10-CM

## 2013-07-07 LAB — LIPID PANEL
Cholesterol: 158 mg/dL (ref 0–200)
LDL Cholesterol: 85 mg/dL (ref 0–99)
Triglycerides: 90 mg/dL (ref 0.0–149.0)
VLDL: 18 mg/dL (ref 0.0–40.0)

## 2013-07-07 LAB — MICROALBUMIN / CREATININE URINE RATIO
Creatinine,U: 90.2 mg/dL
Microalb Creat Ratio: 0.1 mg/g (ref 0.0–30.0)
Microalb, Ur: 0.1 mg/dL (ref 0.0–1.9)

## 2013-07-07 LAB — HEPATIC FUNCTION PANEL
ALT: 27 U/L (ref 0–35)
AST: 27 U/L (ref 0–37)
Alkaline Phosphatase: 55 U/L (ref 39–117)
Bilirubin, Direct: 0 mg/dL (ref 0.0–0.3)
Total Bilirubin: 0.6 mg/dL (ref 0.3–1.2)
Total Protein: 6.7 g/dL (ref 6.0–8.3)

## 2013-07-07 LAB — BASIC METABOLIC PANEL
BUN: 13 mg/dL (ref 6–23)
Calcium: 9 mg/dL (ref 8.4–10.5)
Chloride: 100 mEq/L (ref 96–112)
Creatinine, Ser: 0.6 mg/dL (ref 0.4–1.2)
GFR: 98 mL/min (ref >60.00)
Glucose, Bld: 127 mg/dL — ABNORMAL HIGH (ref 70–99)

## 2013-07-13 ENCOUNTER — Ambulatory Visit (INDEPENDENT_AMBULATORY_CARE_PROVIDER_SITE_OTHER): Payer: Medicare HMO | Admitting: Internal Medicine

## 2013-07-13 ENCOUNTER — Encounter: Payer: Self-pay | Admitting: Internal Medicine

## 2013-07-13 VITALS — BP 120/70 | HR 55 | Temp 98.4°F | Ht 64.25 in | Wt 148.2 lb

## 2013-07-13 DIAGNOSIS — E7800 Pure hypercholesterolemia, unspecified: Secondary | ICD-10-CM

## 2013-07-13 DIAGNOSIS — E039 Hypothyroidism, unspecified: Secondary | ICD-10-CM

## 2013-07-13 DIAGNOSIS — K219 Gastro-esophageal reflux disease without esophagitis: Secondary | ICD-10-CM

## 2013-07-13 DIAGNOSIS — I10 Essential (primary) hypertension: Secondary | ICD-10-CM

## 2013-07-13 DIAGNOSIS — E871 Hypo-osmolality and hyponatremia: Secondary | ICD-10-CM

## 2013-07-13 DIAGNOSIS — C50919 Malignant neoplasm of unspecified site of unspecified female breast: Secondary | ICD-10-CM

## 2013-07-13 DIAGNOSIS — E119 Type 2 diabetes mellitus without complications: Secondary | ICD-10-CM

## 2013-07-13 MED ORDER — OMEPRAZOLE 20 MG PO CPDR: 20.0000 mg | DELAYED_RELEASE_CAPSULE | Freq: Every day | ORAL | Status: DC

## 2013-07-13 MED ORDER — CANAGLIFLOZIN 100 MG PO TABS: 100.0000 mg | ORAL_TABLET | Freq: Every day | ORAL | Status: DC

## 2013-07-13 NOTE — Progress Notes (Signed)
Pre-visit discussion using our clinic review tool. No additional management support is needed unless otherwise documented below in the visit note.  

## 2013-07-14 ENCOUNTER — Telehealth: Payer: Self-pay | Admitting: Internal Medicine

## 2013-07-14 ENCOUNTER — Encounter: Payer: Self-pay | Admitting: Internal Medicine

## 2013-07-14 MED ORDER — METOPROLOL SUCCINATE ER 50 MG PO TB24: 50.0000 mg | ORAL_TABLET | Freq: Every day | ORAL | Status: DC

## 2013-07-14 NOTE — Telephone Encounter (Signed)
Noted.  Will contact Dr Bary Castilla and see if they have ordered.

## 2013-07-14 NOTE — Assessment & Plan Note (Signed)
On lipitor.  Low cholesterol diet and exercise.   Follow lipid panel and liver function.   

## 2013-07-14 NOTE — Assessment & Plan Note (Signed)
On synthroid.  Follow tsh.  Last tsh wnl.     

## 2013-07-14 NOTE — Assessment & Plan Note (Signed)
Worsening acid reflux on protonix.  Prevacid controlled her symptoms.  Due to insurance, will change to omeprazole in the am and ranitidine in the evening.  Follow.  Will let me know if she has break through symptoms.

## 2013-07-14 NOTE — Progress Notes (Signed)
Subjective:    Patient ID: Michelle Hawkins, female    DOB: [birth year], 68 y.o.   MRN: [id number]  HPI 68 year old female with past history of breast cancer (s/p chemotherapy/XRT), hypertension, diabetes and hypercholesterolemia who comes in today to follow up on these issues as well as for a complete physical exam.   Feels the lexapro is helping with her anxiety/stress.  Recent sodium normal.  a1c recently checked and elevated at 7.6.   Discussed at length with her today.  Discussed additional medication.  She is on metformin.  Is followed at [facility].  They suggested Invokana.  She is willing to take this medication.   Will work on diet and exercise.  She does report increased drainage.  No fever.  discussed using saline nasal spray.   No chest pain or tightness.  No increased acid reflux reported today.   No vomiting.  No dysphagia.  No bowel change.    Past Medical History  Diagnosis Date  . Breast cancer 1996    s/p lumpectomy (lymph node dissection - 2/11 positive), chemotherapy  . Diabetes mellitus   . H/O ulcer disease     PUD  . GERD (gastroesophageal reflux disease)   . Hypercholesterolemia   . Nephrolithiasis   . Hypothyroidism     multinodular goiter  . Scoliosis   . Dysphagia   . SVT (supraventricular tachycardia)   . Allergic rhinitis     Current Outpatient Prescriptions on File Prior to Visit  Medication Sig Dispense Refill  . aspirin 81 MG tablet Take 81 mg by mouth 2 (two) times daily.       Marland Kitchen atorvastatin (LIPITOR) 20 MG tablet Take 1 tablet (20 mg total) by mouth daily.  90 tablet  1  . Biotin (BIOTIN 5000) 5 MG CAPS Take by mouth.      . Blood Glucose Monitoring Suppl (ONE TOUCH ULTRA SYSTEM KIT) W/DEVICE KIT 1 kit by Does not apply route once.  1 each  0  . calcium carbonate 200 MG capsule Take 250 mg by mouth 2 (two) times daily with a meal.      . cholecalciferol (VITAMIN D) 400 UNITS TABS Take by mouth.      . escitalopram (LEXAPRO) 20 MG tablet Take 1 tablet  (20 mg total) by mouth daily.  90 tablet  0  . fish oil-omega-3 fatty acids 1000 MG capsule Take 2 g by mouth daily.      Marland Kitchen glucose blood (ONE TOUCH ULTRA TEST) test strip One touch ultra II test strips Check blood sugar bid  Dx 250.00  100 each  12  . lansoprazole (PREVACID) 30 MG capsule Take 1 capsule (30 mg total) by mouth 2 (two) times daily.  60 capsule  1  . levothyroxine (SYNTHROID, LEVOTHROID) 25 MCG tablet Take 1 tablet (25 mcg total) by mouth daily.  90 tablet  0  . metFORMIN (GLUCOPHAGE) 500 MG tablet Take two tablets bid  360 tablet  3  . metoprolol (LOPRESSOR) 50 MG tablet Take 1 tablet (50 mg total) by mouth 2 (two) times daily.  180 tablet  3  . mupirocin ointment (BACTROBAN) 2 %       . ramipril (ALTACE) 10 MG capsule Take 1 capsule (10 mg total) by mouth daily.  90 capsule  1  . TURMERIC PO Take by mouth daily.        No current facility-administered medications on file prior to visit.    Review of Systems Patient  denies any headache, lightheadedness or dizziness. Drainage as outlined.  No chest pain, tightness or palpitations.  No increased shortness of breath.  No nausea or vomiting.  No increased acid reflux reported.   No abdominal pain or cramping.  No bowel change, such as diarrhea, constipation, BRBPR or melana.  No urine change.   Doing well on lexapro.  She brought in no recorded sugar readings.  Wants to change to daily toprol instead of bid metoprolol.  Also needs to change her prevacid.  Too expensive.  Request omeprazole.      Objective:   Physical Exam  Filed Vitals:   07/13/13 1435  BP: 120/70  Pulse: 55  Temp: 98.4 F (36.9 C)   Pulse recheck 30  68 year old female in no acute distress.   HEENT:  Nares- clear.  Oropharynx - without lesions. NECK:  Supple.  Nontender.  No audible bruit.  HEART:  Appears to be regular. LUNGS:  No crackles or wheezing audible.  Respirations even and unlabored.  RADIAL PULSE:  Equal bilaterally.    BREASTS:  No  nipple discharge or nipple retraction present.  Could not appreciate any distinct nodules or axillary adenopathy.  ABDOMEN:  Soft, nontender.  Bowel sounds present and normal.  No audible abdominal bruit.  GU:  Not performed.     EXTREMITIES:  No increased edema present.  DP pulses palpable and equal bilaterally.      FEET:  Without lesions.      Assessment & Plan:  INCREASED STRESS/ANXIETY.  On lexapro.  Doing better since being back on lexapro.  Sodium just checked and normal.    ANEMIA.  Follow cbc and ferritin.  Last check wnl.   ABNORMAL MAMMOGRAM.  Referred to Dr Bary Castilla.  S/p biopsy.  Everything checked out fine.  Per review of Dr Dwyane Luo note, states was due 6 month f/u mammo in 6 months.  Need to see if done/scheduled.      HEALTH MAINTENANCE.  Physical today.  She is s/p hysterectomy.   Mammogram 09/23/11 BiRADS II.  Most recent mammo - Birads 4.  Referred to Dr Bary Castilla.  S/p biopsy.  Everything checked out fine.  See above.   Last colonoscopy 10/14/12 revealed internal hemorrhoids otherwise normal.  Recommended follow up colonoscopy in 10 years.   I spent 40 minutes with the patient and more than 50% of the time was spent in consultation regarding the above.

## 2013-07-14 NOTE — Telephone Encounter (Signed)
Pt was here yesterday.  After discussion with her regarding when due f/u mammo - I reviewed Dr Dwyane Luo records.  Per his note, she was due a 6 month f/u.  Did she have this done.  Does Dr Dwyane Luo office have record of this or have they scheduled.  (overdue).  Also, she had requested rx's to [facility].  I sent in her Toprol XL and the omeprazole (the ones she specifically mentioned).  Does she need all of these sent to [facility]).  Thanks.

## 2013-07-14 NOTE — Assessment & Plan Note (Signed)
Sodium just checked and wnl.   

## 2013-07-14 NOTE — Assessment & Plan Note (Signed)
a1c increased.  On metformin.  Will add invokana 100mg  q day.  Follow sugars.  Get her back in soon to reassess.

## 2013-07-14 NOTE — Assessment & Plan Note (Signed)
Blood pressure under good control.  Same medication.  Follow metabolic panel.   

## 2013-07-14 NOTE — Assessment & Plan Note (Signed)
Previously saw Dr Bary Castilla and Dr Oliva Bustard.  Had been released.  Mammogram 09/13/11 - BiRADS II.  Recent abnormal mammogram.  Saw Dr Bary Castilla.  Biopsy ok.  Confirm when f/u mammogram.

## 2013-07-14 NOTE — Telephone Encounter (Signed)
Pt states that she didn't know anything about a f/u mammo in 6 months. She would like for [place] to schedule it if she needs it. She also stated that she will check her meds & let us know if she needs anything else refilled at this time.

## 2013-07-15 ENCOUNTER — Telehealth: Payer: Self-pay | Admitting: Internal Medicine

## 2013-07-15 DIAGNOSIS — R928 Other abnormal and inconclusive findings on diagnostic imaging of breast: Secondary | ICD-10-CM

## 2013-07-15 NOTE — Telephone Encounter (Signed)
Ordered left breast mammo.  Overdue 6 month f/u mammo of left breast.  Abnormal mammo.

## 2013-08-03 ENCOUNTER — Other Ambulatory Visit: Payer: Self-pay | Admitting: *Deleted

## 2013-08-03 MED ORDER — METOPROLOL SUCCINATE ER 50 MG PO TB24: 50.0000 mg | ORAL_TABLET | Freq: Every day | ORAL | Status: DC

## 2013-08-03 MED ORDER — LANSOPRAZOLE 30 MG PO CPDR: 30.0000 mg | DELAYED_RELEASE_CAPSULE | Freq: Two times a day (BID) | ORAL | Status: DC

## 2013-08-07 ENCOUNTER — Other Ambulatory Visit: Payer: Self-pay | Admitting: Internal Medicine

## 2013-08-07 MED ORDER — LANSOPRAZOLE 30 MG PO CPDR: 30.0000 mg | DELAYED_RELEASE_CAPSULE | Freq: Two times a day (BID) | ORAL | Status: DC

## 2013-08-07 MED ORDER — METOPROLOL SUCCINATE ER 50 MG PO TB24: 50.0000 mg | ORAL_TABLET | Freq: Every day | ORAL | Status: DC

## 2013-08-07 NOTE — Telephone Encounter (Signed)
Medication was sent to the incorrect pharmacy earlier this week . Re-sent to correct pharmacy.

## 2013-08-13 ENCOUNTER — Telehealth: Payer: Self-pay | Admitting: *Deleted

## 2013-08-13 MED ORDER — GLUCOSE BLOOD VI STRP: ORAL_STRIP | Status: DC

## 2013-08-13 MED ORDER — ACCU-CHEK SOFTCLIX LANCETS MISC: Status: DC

## 2013-08-13 NOTE — Telephone Encounter (Signed)
Pharmacy Note:  Is requesting a 90 day supply please fax new Rx for Accu-chek,Aviva plus care kit strips & softclix lancets

## 2013-08-13 NOTE — Telephone Encounter (Signed)
Rx sent to pharmacy by escript  

## 2013-08-17 ENCOUNTER — Telehealth: Payer: Self-pay | Admitting: Internal Medicine

## 2013-08-17 NOTE — Telephone Encounter (Signed)
Pt called for sooner appt, has scheduled appt 2/16. Pt states she cannot wait until 2/16.  States her refills will run out before then and she is out of the [place] that Dr. Nicki Reaper has prescribed.  Has gotten test strips and a new meter has been ordered but will be 7-10 days.  States she is almost out of strips for the machine she has been using until the new meter comes.  States if she could get some samples to last her until appt on Monday that will be fine.  Also asking for Care Card for the [place].  Pt has had cough x 2 month.  X-ray negative for pneumonia.  States she can't get over the cough and wants to make sure nothing is wrong.  Pt would like a call with advice, or a sooner appt.

## 2013-08-18 NOTE — Telephone Encounter (Signed)
Please call patient an offer her appt (see response below)

## 2013-08-18 NOTE — Telephone Encounter (Signed)
I can work her in during lunch on Wednesday (08/19/13) -  (12:30).  Can discuss her issues then.

## 2013-08-19 ENCOUNTER — Encounter (INDEPENDENT_AMBULATORY_CARE_PROVIDER_SITE_OTHER): Payer: Self-pay

## 2013-08-19 ENCOUNTER — Encounter: Payer: Self-pay | Admitting: Internal Medicine

## 2013-08-19 ENCOUNTER — Ambulatory Visit (INDEPENDENT_AMBULATORY_CARE_PROVIDER_SITE_OTHER): Payer: Medicare HMO | Admitting: Internal Medicine

## 2013-08-19 VITALS — BP 110/70 | HR 51 | Temp 98.0°F | Wt 144.8 lb

## 2013-08-19 DIAGNOSIS — R05 Cough: Secondary | ICD-10-CM

## 2013-08-19 DIAGNOSIS — C50919 Malignant neoplasm of unspecified site of unspecified female breast: Secondary | ICD-10-CM

## 2013-08-19 DIAGNOSIS — R059 Cough, unspecified: Secondary | ICD-10-CM

## 2013-08-19 DIAGNOSIS — K219 Gastro-esophageal reflux disease without esophagitis: Secondary | ICD-10-CM

## 2013-08-19 DIAGNOSIS — E119 Type 2 diabetes mellitus without complications: Secondary | ICD-10-CM

## 2013-08-19 DIAGNOSIS — E7800 Pure hypercholesterolemia, unspecified: Secondary | ICD-10-CM

## 2013-08-19 DIAGNOSIS — I10 Essential (primary) hypertension: Secondary | ICD-10-CM

## 2013-08-19 DIAGNOSIS — E039 Hypothyroidism, unspecified: Secondary | ICD-10-CM

## 2013-08-19 MED ORDER — CANAGLIFLOZIN 100 MG PO TABS: 100.0000 mg | ORAL_TABLET | Freq: Every day | ORAL | Status: DC

## 2013-08-19 MED ORDER — LOSARTAN POTASSIUM 100 MG PO TABS: 100.0000 mg | ORAL_TABLET | Freq: Every day | ORAL | Status: DC

## 2013-08-19 NOTE — Progress Notes (Signed)
Pre-visit discussion using our clinic review tool. No additional management support is needed unless otherwise documented below in the visit note.  

## 2013-08-23 ENCOUNTER — Encounter: Payer: Self-pay | Admitting: Internal Medicine

## 2013-08-23 DIAGNOSIS — R05 Cough: Secondary | ICD-10-CM | POA: Insufficient documentation

## 2013-08-23 DIAGNOSIS — R059 Cough, unspecified: Secondary | ICD-10-CM | POA: Insufficient documentation

## 2013-08-23 NOTE — Progress Notes (Signed)
Subjective:    Patient ID: Michelle Hawkins, female    DOB: Aug 09, 1945, 68 y.o.   MRN: [id number]  Cough  68 year old female with past history of breast cancer (s/p chemotherapy/XRT), hypertension, diabetes and hypercholesterolemia who comes in today as a work in with concerns regarding a persistent cough and wanted to discuss her blood sugars.   Feels the lexapro is helping with her anxiety/stress.  Recent sodium normal.  a1c recently checked and elevated at 7.6.   Discussed at length with her last visit.   She started Invokana.  Sugars doing better.  AM blood sugars averaging 100-110 and sugars two hours after supper 120-140s and before bed 80-114.   No chest pain or tightness.  No increased acid reflux reported today.   No vomiting.  No dysphagia.  No bowel change.  She does report a persistent cough.  No significant chest congestion.  We discussed the possibility of lisinopril as the etiology.     Past Medical History  Diagnosis Date  . Breast cancer 1996    s/p lumpectomy (lymph node dissection - 2/11 positive), chemotherapy  . Diabetes mellitus   . H/O ulcer disease     PUD  . GERD (gastroesophageal reflux disease)   . Hypercholesterolemia   . Nephrolithiasis   . Hypothyroidism     multinodular goiter  . Scoliosis   . Dysphagia   . SVT (supraventricular tachycardia)   . Allergic rhinitis     Current Outpatient Prescriptions on File Prior to Visit  Medication Sig Dispense Refill  . ACCU-CHEK SOFTCLIX LANCETS lancets Check blood sugar twice daily Dx 250.00  200 each  1  . aspirin 81 MG tablet Take 81 mg by mouth 2 (two) times daily.       Marland Kitchen atorvastatin (LIPITOR) 20 MG tablet Take 1 tablet (20 mg total) by mouth daily.  90 tablet  1  . Biotin (BIOTIN 5000) 5 MG CAPS Take by mouth.      . Blood Glucose Monitoring Suppl (ONE TOUCH ULTRA SYSTEM KIT) W/DEVICE KIT 1 kit by Does not apply route once.  1 each  0  . calcium carbonate 200 MG capsule Take 250 mg by mouth 2 (two) times daily  with a meal.      . cholecalciferol (VITAMIN D) 400 UNITS TABS Take by mouth.      . escitalopram (LEXAPRO) 20 MG tablet Take 1 tablet (20 mg total) by mouth daily.  90 tablet  0  . fish oil-omega-3 fatty acids 1000 MG capsule Take 2 g by mouth daily.      Marland Kitchen glucose blood (ACCU-CHEK AVIVA PLUS) test strip Check blood sugar twice daily Dx 250.00  200 each  1  . levothyroxine (SYNTHROID, LEVOTHROID) 25 MCG tablet Take 1 tablet (25 mcg total) by mouth daily.  90 tablet  0  . metFORMIN (GLUCOPHAGE) 500 MG tablet Take two tablets bid  360 tablet  3  . metoprolol succinate (TOPROL XL) 50 MG 24 hr tablet Take 1 tablet (50 mg total) by mouth daily. Take with or immediately following a meal.  90 tablet  1  . mupirocin ointment (BACTROBAN) 2 %       . omeprazole (PRILOSEC) 20 MG capsule Take 1 capsule (20 mg total) by mouth daily.  90 capsule  1  . TURMERIC PO Take by mouth daily.        No current facility-administered medications on file prior to visit.    Review of Systems  Respiratory: Positive for cough.   Patient denies any headache, lightheadedness or dizziness.  Some intermittent drainage.  No chest pain, tightness or palpitations.  No increased shortness of breath.  Persistent cough.  No significant chest congestion.  No nausea or vomiting.  No increased acid reflux reported.  States her acid reflux is controlled well on omeprazole.  No abdominal pain or cramping.  No bowel change, such as diarrhea, constipation, BRBPR or melana.  No urine change.   Doing well on lexapro.  Sugars better as outlined.       Objective:   Physical Exam  Filed Vitals:   08/19/13 1237  BP: 110/70  Pulse: 51  Temp: 98 F (63.67 C)   68 year old female in no acute distress.   HEENT:  Nares- clear.  Oropharynx - without lesions. NECK:  Supple.  Nontender.  No audible bruit.  HEART:  Appears to be regular. LUNGS:  No crackles or wheezing audible.  Respirations even and unlabored.  RADIAL PULSE:  Equal  bilaterally.   ABDOMEN:  Soft, nontender.  Bowel sounds present and normal.  No audible abdominal bruit.     EXTREMITIES:  No increased edema present.  DP pulses palpable and equal bilaterally.      FEET:  Without lesions.      Assessment & Plan:  INCREASED STRESS/ANXIETY.  On lexapro.  Doing better.    ANEMIA.  Follow cbc and ferritin.  Last check wnl.   ABNORMAL MAMMOGRAM.  Referred to Dr Bary Castilla.  S/p biopsy.  Everything checked out fine.  Per review of Dr Dwyane Luo note, states was due 6 month f/u mammo in 6 months.  Has been ordered.  Needs.      HEALTH MAINTENANCE.  Physical 10/11/13.   She is s/p hysterectomy.   Mammogram 09/23/11 BiRADS II.  Most recent mammo - Birads 4.  Referred to Dr Bary Castilla.  S/p biopsy.  Everything checked out fine.  See above.  Due f/u mammo (6 month f/u).  Already ordered.   Last colonoscopy 10/14/12 revealed internal hemorrhoids otherwise normal.  Recommended follow up colonoscopy in 10 years.

## 2013-08-23 NOTE — Assessment & Plan Note (Signed)
Previously saw Dr Bary Castilla and Dr Oliva Bustard.  Had been released.  Mammogram 09/13/11 - BiRADS II.  Recent abnormal mammogram.  Saw Dr Bary Castilla.  Biopsy ok.  F/u mammogram has been scheduled.  Needs.

## 2013-08-23 NOTE — Assessment & Plan Note (Signed)
Symptoms controlled on omeprazole.   

## 2013-08-23 NOTE — Assessment & Plan Note (Signed)
Will stop lisinopril.  Start losartan 50mg  q day.  Treat allergies.  Follow.

## 2013-08-23 NOTE — Assessment & Plan Note (Signed)
On lipitor.  Low cholesterol diet and exercise.   Follow lipid panel and liver function.   

## 2013-08-23 NOTE — Assessment & Plan Note (Signed)
Blood pressure under good control.  With the cough, will stop lisinopril.  Start losartan 50mg  q day.  Follow pressures.   Follow metabolic panel.

## 2013-08-23 NOTE — Assessment & Plan Note (Signed)
On synthroid.  Follow tsh.  Last tsh wnl.     

## 2013-08-23 NOTE — Assessment & Plan Note (Signed)
Sugars as outlined.  Doing well with the added Invokana.  Continue diet adjustment.  Follow sugars.  Up to date with eye checks.  Follow met b and a1c.    

## 2013-08-24 ENCOUNTER — Ambulatory Visit: Payer: Medicare HMO | Admitting: Internal Medicine

## 2013-09-07 ENCOUNTER — Telehealth: Payer: Self-pay | Admitting: Internal Medicine

## 2013-09-07 NOTE — Telephone Encounter (Signed)
The patient missed her appointment during the snow and she is wanting to be seen sooner than 3.13.15. Please advise .

## 2013-09-07 NOTE — Telephone Encounter (Signed)
Pt aware of 3/10 appointemnt

## 2013-09-07 NOTE — Telephone Encounter (Signed)
I can see her at 11:45 on 09/15/13.

## 2013-09-08 ENCOUNTER — Telehealth: Payer: Self-pay | Admitting: Internal Medicine

## 2013-09-08 NOTE — Telephone Encounter (Signed)
Pt came into office to rs appt 3/10.  This appt was made as an add on due to pt coughing.  Pt states she cannot come that day due to a luncheon that she cannot miss.  Asking if she can be scheduled on another day.  Has appt 3/25 scheduled, will wait until then if absolutely necessary, but would like to be seen sooner.  Also states if she can come in sooner than the 3/25 appt she would like to do everything that day and would cancel the appt 3/25.  States she could come in sooner for blood work as well.

## 2013-09-09 NOTE — Telephone Encounter (Signed)
Pt aware of appointment date and time °

## 2013-09-09 NOTE — Telephone Encounter (Signed)
See me about this pt.  I can see her at 2:30 on 09/17/13.  (the pt in this appt slot needs to reschedule).  See the phone message on this pt.  Thanks.  Block the 70min slot for Ms Pridgen.

## 2013-09-12 ENCOUNTER — Other Ambulatory Visit: Payer: Self-pay | Admitting: *Deleted

## 2013-09-12 MED ORDER — CANAGLIFLOZIN 100 MG PO TABS: 100.0000 mg | ORAL_TABLET | Freq: Every day | ORAL | Status: DC

## 2013-09-12 MED ORDER — LEVOTHYROXINE SODIUM 25 MCG PO TABS: 25.0000 ug | ORAL_TABLET | Freq: Every day | ORAL | Status: DC

## 2013-09-12 MED ORDER — ATORVASTATIN CALCIUM 20 MG PO TABS: 20.0000 mg | ORAL_TABLET | Freq: Every day | ORAL | Status: DC

## 2013-09-12 MED ORDER — OMEPRAZOLE 20 MG PO CPDR: 20.0000 mg | DELAYED_RELEASE_CAPSULE | Freq: Every day | ORAL | Status: DC

## 2013-09-12 MED ORDER — LOSARTAN POTASSIUM 100 MG PO TABS: 100.0000 mg | ORAL_TABLET | Freq: Every day | ORAL | Status: DC

## 2013-09-12 MED ORDER — ESCITALOPRAM OXALATE 20 MG PO TABS: 20.0000 mg | ORAL_TABLET | Freq: Every day | ORAL | Status: DC

## 2013-09-15 ENCOUNTER — Ambulatory Visit: Payer: Medicare HMO | Admitting: Internal Medicine

## 2013-09-17 ENCOUNTER — Encounter: Payer: Self-pay | Admitting: Internal Medicine

## 2013-09-17 ENCOUNTER — Ambulatory Visit (INDEPENDENT_AMBULATORY_CARE_PROVIDER_SITE_OTHER): Payer: Medicare HMO | Admitting: Internal Medicine

## 2013-09-17 ENCOUNTER — Ambulatory Visit: Payer: Self-pay | Admitting: Internal Medicine

## 2013-09-17 VITALS — BP 130/60 | HR 55 | Temp 98.3°F | Ht 64.25 in | Wt 145.0 lb

## 2013-09-17 DIAGNOSIS — I10 Essential (primary) hypertension: Secondary | ICD-10-CM

## 2013-09-17 DIAGNOSIS — R062 Wheezing: Secondary | ICD-10-CM

## 2013-09-17 DIAGNOSIS — R05 Cough: Secondary | ICD-10-CM

## 2013-09-17 DIAGNOSIS — E119 Type 2 diabetes mellitus without complications: Secondary | ICD-10-CM

## 2013-09-17 DIAGNOSIS — K219 Gastro-esophageal reflux disease without esophagitis: Secondary | ICD-10-CM

## 2013-09-17 DIAGNOSIS — C50919 Malignant neoplasm of unspecified site of unspecified female breast: Secondary | ICD-10-CM

## 2013-09-17 DIAGNOSIS — R059 Cough, unspecified: Secondary | ICD-10-CM

## 2013-09-17 LAB — HM MAMMOGRAPHY: HM MAMMO: NEGATIVE

## 2013-09-17 MED ORDER — CEFDINIR 300 MG PO CAPS: 300.0000 mg | ORAL_CAPSULE | Freq: Two times a day (BID) | ORAL | Status: DC

## 2013-09-17 MED ORDER — ALBUTEROL SULFATE HFA 108 (90 BASE) MCG/ACT IN AERS: 2.0000 | INHALATION_SPRAY | Freq: Four times a day (QID) | RESPIRATORY_TRACT | Status: DC | PRN

## 2013-09-17 NOTE — Progress Notes (Signed)
Pre-visit discussion using our clinic review tool. No additional management support is needed unless otherwise documented below in the visit note.  

## 2013-09-17 NOTE — Assessment & Plan Note (Addendum)
Persistent.  Now with acute exacerbation.  Increased congestion as outlined.  Treat acute infection.  Omnicef as directed.  Saline nasal spray and flonase as directed.  Rescue inhaler as needed.  Never started the steroid inhaler secondary to cost.  Had samples of asmanex.  Instructed on proper way to use.  Use steroid inhaler regularly.  Also has the persistent chronic cough.  Did not improve with treating allergies and stopping the ACE inhibitor.  Given persistence, will refer to pulmonary for further evaluation and treatment.

## 2013-09-17 NOTE — Patient Instructions (Signed)
Mucinex DM in the am and Robitussin DM in the evening.   

## 2013-09-17 NOTE — Assessment & Plan Note (Signed)
Nebulizer treatment with albuterol given in office. Start Azithromycin and prednisone taper. RTC if no improvement within 3-4 days.

## 2013-09-18 ENCOUNTER — Telehealth: Payer: Self-pay | Admitting: *Deleted

## 2013-09-18 ENCOUNTER — Encounter: Payer: Self-pay | Admitting: Internal Medicine

## 2013-09-18 NOTE — Telephone Encounter (Addendum)
Received PA approval on Invokana 100 mg tablet 90 for 90. Auth good until: 09/17/15. Copy of approval faxed to [facility]

## 2013-09-20 ENCOUNTER — Encounter: Payer: Self-pay | Admitting: Internal Medicine

## 2013-09-20 NOTE — Assessment & Plan Note (Signed)
Sugars as outlined.  Doing well with the added Invokana.  Continue diet adjustment.  Follow sugars.  Up to date with eye checks.  Follow met b and a1c.    

## 2013-09-20 NOTE — Progress Notes (Signed)
Subjective:    Patient ID: Michelle Hawkins, female    DOB: 05/31/1946, 67 y.o.   MRN: [id number]  Cough  67 year old female with past history of breast cancer (s/p chemotherapy/XRT), hypertension, diabetes and hypercholesterolemia who comes in today as a work in with concerns regarding a persistent cough and wanted to discuss her blood sugars.   Feels the lexapro is helping with her anxiety/stress.  Recent sodium normal.  a1c recently checked and elevated at 7.6.  I started her on Invokana.  Sugars doing better.  AM blood sugars averaging 100-110 and sugars two hours after supper 120-140s and before bed 95-115.   PM sugars averaging 110-140 two hours after eating and 90-115 before bed.  No chest pain or tightness.  No increased acid reflux reported today.   No vomiting.  No dysphagia.  No bowel change.  She does report a persistent cough.  See last note for details.  Over the last 7-10 days, cough and congestion have worsened.  Reports some increased chest congestion and head congestion.  Increased sinus pressure.  Hurts into her teeth.  Bends over - pressure.  Cough recently has been productive.  Some wheezing.  Cough has been persistent, but now has developed increased congestion the last week as outlined.       Past Medical History  Diagnosis Date  . Breast cancer 1996    s/p lumpectomy (lymph node dissection - 2/11 positive), chemotherapy  . Diabetes mellitus   . H/O ulcer disease     PUD  . GERD (gastroesophageal reflux disease)   . Hypercholesterolemia   . Nephrolithiasis   . Hypothyroidism     multinodular goiter  . Scoliosis   . Dysphagia   . SVT (supraventricular tachycardia)   . Allergic rhinitis     Current Outpatient Prescriptions on File Prior to Visit  Medication Sig Dispense Refill  . ACCU-CHEK SOFTCLIX LANCETS lancets Check blood sugar twice daily Dx 250.00  200 each  1  . aspirin 81 MG tablet Take 81 mg by mouth 2 (two) times daily.       . atorvastatin (LIPITOR) 20 MG  tablet Take 1 tablet (20 mg total) by mouth daily.  90 tablet  1  . Biotin (BIOTIN 5000) 5 MG CAPS Take by mouth.      . Blood Glucose Monitoring Suppl (ONE TOUCH ULTRA SYSTEM KIT) W/DEVICE KIT 1 kit by Does not apply route once.  1 each  0  . calcium carbonate 200 MG capsule Take 250 mg by mouth 2 (two) times daily with a meal.      . Canagliflozin (INVOKANA) 100 MG TABS Take 1 tablet (100 mg total) by mouth daily.  90 tablet  1  . cholecalciferol (VITAMIN D) 400 UNITS TABS Take by mouth.      . escitalopram (LEXAPRO) 20 MG tablet Take 1 tablet (20 mg total) by mouth daily.  90 tablet  0  . fish oil-omega-3 fatty acids 1000 MG capsule Take 2 g by mouth daily.      . glucose blood (ACCU-CHEK AVIVA PLUS) test strip Check blood sugar twice daily Dx 250.00  200 each  1  . levothyroxine (SYNTHROID, LEVOTHROID) 25 MCG tablet Take 1 tablet (25 mcg total) by mouth daily.  90 tablet  1  . metFORMIN (GLUCOPHAGE) 500 MG tablet Take two tablets bid  360 tablet  3  . metoprolol succinate (TOPROL XL) 50 MG 24 hr tablet Take 1 tablet (50 mg total) by   mouth daily. Take with or immediately following a meal.  90 tablet  1  . mupirocin ointment (BACTROBAN) 2 %       . omeprazole (PRILOSEC) 20 MG capsule Take 1 capsule (20 mg total) by mouth daily.  90 capsule  1  . TURMERIC PO Take by mouth daily.       Marland Kitchen losartan (COZAAR) 100 MG tablet Take 1 tablet (100 mg total) by mouth daily.  90 tablet  1   No current facility-administered medications on file prior to visit.    Review of Systems  Respiratory: Positive for cough.   Patient denies any headache, lightheadedness or dizziness.  Some increased nasal congestion and chest congestion.  Increased sinus pressure.   No chest pain, tightness or palpitations.  No increased shortness of breath.  Persistent cough.  No nausea or vomiting.  No increased acid reflux reported.  States her acid reflux is controlled well on omeprazole.  No abdominal pain or cramping.  No bowel  change, such as diarrhea, constipation, BRBPR or melana.  No urine change.   Doing well on lexapro.  Sugars better as outlined.       Objective:   Physical Exam  Filed Vitals:   09/17/13 1439  BP: 130/60  Pulse: 55  Temp: 98.3 F (63.28 C)   68 year old female in no acute distress.   HEENT:  Nares- slightly erythematous turbinates.  Oropharynx - without lesions. NECK:  Supple.  Nontender.  No audible bruit.  HEART:  Appears to be regular. LUNGS:  No crackles or wheezing audible.  Increased congestion - clears with coughing.   Some increased cough with forced expiration.  Respirations even and unlabored.  RADIAL PULSE:  Equal bilaterally.   ABDOMEN:  Soft, nontender.  Bowel sounds present and normal.  No audible abdominal bruit.     EXTREMITIES:  No increased edema present.  DP pulses palpable and equal bilaterally.      Assessment & Plan:  INCREASED STRESS/ANXIETY.  On lexapro.  Doing better.    ANEMIA.  Follow cbc and ferritin.  Last check wnl.   ABNORMAL MAMMOGRAM.  Referred to Dr Bary Castilla.  S/p biopsy.  Everything checked out fine.  Per review of Dr Dwyane Luo note, states was due 6 month f/u mammo in 6 months.  Has been ordered.  Need results.       HEALTH MAINTENANCE.  Physical 10/11/13.   She is s/p hysterectomy.   Mammogram 09/23/11 BiRADS II.  Most recent mammo - Birads 4.  Referred to Dr Bary Castilla.  S/p biopsy.  Everything checked out fine.  See above.  Due f/u mammo (6 month f/u).  Already ordered.   Last colonoscopy 10/14/12 revealed internal hemorrhoids otherwise normal.  Recommended follow up colonoscopy in 10 years.

## 2013-09-20 NOTE — Assessment & Plan Note (Signed)
Symptoms controlled on omeprazole.   

## 2013-09-20 NOTE — Assessment & Plan Note (Signed)
Blood pressure under good control.  With the cough, I stopped lisinopril last visit.  Started losartan 50mg  q day.  Follow pressures.   Follow metabolic panel.

## 2013-09-20 NOTE — Assessment & Plan Note (Signed)
Previously saw Dr Bary Castilla and Dr Oliva Bustard.  Had been released.  Mammogram 09/13/11 - BiRADS II.  Recent abnormal mammogram.  Saw Dr Bary Castilla.  Biopsy ok.  F/u mammogram has been scheduled.  Need results.

## 2013-09-23 ENCOUNTER — Telehealth: Payer: Self-pay | Admitting: Internal Medicine

## 2013-09-23 NOTE — Telephone Encounter (Signed)
See below-pt dropped off a form from 09/15/13 that stated that her Invokana would not be covered by insurance or requires a PA. I contacted the pharmacy since I received the PA approval on 09/18/13, & was told that a 90 day supply would cost her $475. I went ahead & asked them to cancel the order.

## 2013-09-23 NOTE — Telephone Encounter (Signed)
Pt left information about rx in box And also stated that dr scott was going to get her an appointment with dr Lake Bells  No referral in system

## 2013-09-24 ENCOUNTER — Other Ambulatory Visit: Payer: Self-pay | Admitting: Internal Medicine

## 2013-09-24 ENCOUNTER — Ambulatory Visit: Payer: Medicare HMO | Admitting: Internal Medicine

## 2013-09-24 DIAGNOSIS — R05 Cough: Secondary | ICD-10-CM

## 2013-09-24 DIAGNOSIS — R0989 Other specified symptoms and signs involving the circulatory and respiratory systems: Secondary | ICD-10-CM

## 2013-09-24 DIAGNOSIS — R059 Cough, unspecified: Secondary | ICD-10-CM

## 2013-09-24 NOTE — Telephone Encounter (Signed)
Notify pt that amber should be contacting her with a lab appt date and time.  You can also notify her of the insurance information.

## 2013-09-24 NOTE — Telephone Encounter (Signed)
Pt's husband notified.

## 2013-09-24 NOTE — Progress Notes (Signed)
Wants to see Dr Lake Bells.  Persistent cough and congestion.

## 2013-09-30 ENCOUNTER — Ambulatory Visit (INDEPENDENT_AMBULATORY_CARE_PROVIDER_SITE_OTHER): Payer: Commercial Managed Care - HMO | Admitting: Internal Medicine

## 2013-09-30 ENCOUNTER — Encounter: Payer: Self-pay | Admitting: Internal Medicine

## 2013-09-30 VITALS — BP 122/70 | HR 53 | Temp 98.2°F | Ht 64.25 in | Wt 143.8 lb

## 2013-09-30 DIAGNOSIS — E039 Hypothyroidism, unspecified: Secondary | ICD-10-CM

## 2013-09-30 DIAGNOSIS — K219 Gastro-esophageal reflux disease without esophagitis: Secondary | ICD-10-CM

## 2013-09-30 DIAGNOSIS — C50919 Malignant neoplasm of unspecified site of unspecified female breast: Secondary | ICD-10-CM

## 2013-09-30 DIAGNOSIS — R05 Cough: Secondary | ICD-10-CM

## 2013-09-30 DIAGNOSIS — E119 Type 2 diabetes mellitus without complications: Secondary | ICD-10-CM

## 2013-09-30 DIAGNOSIS — E871 Hypo-osmolality and hyponatremia: Secondary | ICD-10-CM

## 2013-09-30 DIAGNOSIS — R059 Cough, unspecified: Secondary | ICD-10-CM

## 2013-09-30 DIAGNOSIS — E7800 Pure hypercholesterolemia, unspecified: Secondary | ICD-10-CM

## 2013-09-30 DIAGNOSIS — I10 Essential (primary) hypertension: Secondary | ICD-10-CM

## 2013-09-30 MED ORDER — METFORMIN HCL 500 MG PO TABS: ORAL_TABLET | ORAL | Status: DC

## 2013-09-30 NOTE — Progress Notes (Signed)
Pre-visit discussion using our clinic review tool. No additional management support is needed unless otherwise documented below in the visit note.  

## 2013-10-03 ENCOUNTER — Encounter: Payer: Self-pay | Admitting: Internal Medicine

## 2013-10-03 NOTE — Assessment & Plan Note (Signed)
Sodium just checked and wnl.   

## 2013-10-03 NOTE — Assessment & Plan Note (Signed)
On lipitor.  Low cholesterol diet and exercise.   Follow lipid panel and liver function.   

## 2013-10-03 NOTE — Assessment & Plan Note (Signed)
Previously saw Dr Bary Castilla and Dr Oliva Bustard.  Had been released.  Mammogram 09/13/11 - BiRADS II.  Recent abnormal mammogram.  Saw Dr Bary Castilla.  Biopsy ok.  F/u mammogram has been done.  Need results.  Pt states - ok.

## 2013-10-03 NOTE — Assessment & Plan Note (Signed)
Persistent.  See previous notes for details.  Stopped her ace inhibitor.  On losartan now.  Seemed to respond to steroid inhaler.  Secondary to cost - samples given.  Hard for her to use asmacort.  Had pulmicort samples.  Treat allergies.  Continue steroid nasal spray.  Has rescue inhaler if needed.  Keep f/u with  Pulmonary.

## 2013-10-03 NOTE — Assessment & Plan Note (Signed)
Sugars as outlined.  Doing well with the added Invokana.  Continue diet adjustment.  Follow sugars.  Up to date with eye checks.  Follow met b and a1c.

## 2013-10-03 NOTE — Progress Notes (Signed)
Subjective:    Patient ID: Michelle Hawkins, female    DOB: 12-Sep-1945, 68 y.o.   MRN: [id number]  Cough  68 year old female with past history of breast cancer (s/p chemotherapy/XRT), hypertension, diabetes and hypercholesterolemia who comes in today for a scheduled follow up.   Feels the lexapro is helping with her anxiety/stress. Recent sodium normal.  a1c recently checked and elevated at 7.6.  I started her on Invokana.  Sugars doing better.  AM blood sugars averaging 90-110 and sugars two hours after supper 120-150s and before bed 95-115.   No chest pain or tightness.  No increased acid reflux reported today.   No vomiting.  No dysphagia.  No bowel change.  She has had a persistent cough.  See previous notes for details.  Treated for an acute infection last visit.  Has tried antihistamines and steroid nasal sprays.  Tried inhalers.  Cost is an issue.  Had samples of asmanex.  Used this for a while.  Felt it helped.  Cough is better now.  Has been a persistent problem for her for a while.       Past Medical History  Diagnosis Date  . Breast cancer 1996    s/p lumpectomy (lymph node dissection - 2/11 positive), chemotherapy  . Diabetes mellitus   . H/O ulcer disease     PUD  . GERD (gastroesophageal reflux disease)   . Hypercholesterolemia   . Nephrolithiasis   . Hypothyroidism     multinodular goiter  . Scoliosis   . Dysphagia   . SVT (supraventricular tachycardia)   . Allergic rhinitis     Current Outpatient Prescriptions on File Prior to Visit  Medication Sig Dispense Refill  . ACCU-CHEK SOFTCLIX LANCETS lancets Check blood sugar twice daily Dx 250.00  200 each  1  . albuterol (PROVENTIL HFA;VENTOLIN HFA) 108 (90 BASE) MCG/ACT inhaler Inhale 2 puffs into the lungs every 6 (six) hours as needed for wheezing or shortness of breath.  1 Inhaler  1  . aspirin 81 MG tablet Take 81 mg by mouth 2 (two) times daily.       Marland Kitchen atorvastatin (LIPITOR) 20 MG tablet Take 1 tablet (20 mg total) by  mouth daily.  90 tablet  1  . Biotin (BIOTIN 5000) 5 MG CAPS Take by mouth.      . Blood Glucose Monitoring Suppl (ONE TOUCH ULTRA SYSTEM KIT) W/DEVICE KIT 1 kit by Does not apply route once.  1 each  0  . calcium carbonate 200 MG capsule Take 250 mg by mouth 2 (two) times daily with a meal.      . Canagliflozin (INVOKANA) 100 MG TABS Take 1 tablet (100 mg total) by mouth daily.  90 tablet  1  . cholecalciferol (VITAMIN D) 400 UNITS TABS Take by mouth.      . escitalopram (LEXAPRO) 20 MG tablet Take 1 tablet (20 mg total) by mouth daily.  90 tablet  0  . fish oil-omega-3 fatty acids 1000 MG capsule Take 2 g by mouth daily.      Marland Kitchen glucose blood (ACCU-CHEK AVIVA PLUS) test strip Check blood sugar twice daily Dx 250.00  200 each  1  . levothyroxine (SYNTHROID, LEVOTHROID) 25 MCG tablet Take 1 tablet (25 mcg total) by mouth daily.  90 tablet  1  . losartan (COZAAR) 100 MG tablet Take 1 tablet (100 mg total) by mouth daily.  90 tablet  1  . metoprolol succinate (TOPROL XL) 50 MG 24  hr tablet Take 1 tablet (50 mg total) by mouth daily. Take with or immediately following a meal.  90 tablet  1  . mupirocin ointment (BACTROBAN) 2 %       . omeprazole (PRILOSEC) 20 MG capsule Take 1 capsule (20 mg total) by mouth daily.  90 capsule  1  . TURMERIC PO Take by mouth daily.        No current facility-administered medications on file prior to visit.    Review of Systems  Respiratory: Positive for cough.   Patient denies any headache, lightheadedness or dizziness.  No nasal congestion or significant sinus symptoms.  No chest pain, tightness or palpitations.  No increased shortness of breath.  Persistent cough.  Better now.  No nausea or vomiting.  No increased acid reflux reported.  States her acid reflux is controlled well on omeprazole.  No abdominal pain or cramping.  No bowel change, such as diarrhea, constipation, BRBPR or melana.  No urine change.   Doing well on lexapro.  Sugars better as outlined.        Objective:   Physical Exam  Filed Vitals:   09/30/13 1232  BP: 122/70  Pulse: 53  Temp: 98.2 F (44.19 C)   68 year old female in no acute distress.   HEENT:  Nares- clear.  Oropharynx - without lesions. NECK:  Supple.  Nontender.  No audible bruit.  HEART:  Appears to be regular. LUNGS:  No crackles or wheezing audible.  Respirations even and unlabored.  Good breath sounds bilaterally.   RADIAL PULSE:  Equal bilaterally.   ABDOMEN:  Soft, nontender.  Bowel sounds present and normal.  No audible abdominal bruit.     EXTREMITIES:  No increased edema present.  DP pulses palpable and equal bilaterally.  FEET:  No lesions.       Assessment & Plan:  INCREASED STRESS/ANXIETY.  On lexapro.  Doing better.    ANEMIA.  Follow cbc and ferritin.  Last check wnl.   ABNORMAL MAMMOGRAM.  Referred to Dr Bary Castilla.  S/p biopsy.  Everything checked out fine.  Per review of Dr Dwyane Luo note, states was due 6 month f/u mammo in 6 months.  Has been done.  Need results.  Pt reports - ok.        HEALTH MAINTENANCE.  Physical 10/11/13.   She is s/p hysterectomy.   Mammogram 09/23/11 BiRADS II.  Most recent mammo - Birads 4.  Referred to Dr Bary Castilla.  S/p biopsy.  Everything checked out fine.  See above.  Due f/u mammo (6 month f/u).  Already had.  States ok.     Last colonoscopy 10/14/12 revealed internal hemorrhoids otherwise normal.  Recommended follow up colonoscopy in 10 years.

## 2013-10-03 NOTE — Assessment & Plan Note (Signed)
Symptoms controlled on omeprazole.   

## 2013-10-03 NOTE — Assessment & Plan Note (Signed)
Blood pressure under good control.  With the cough, I stopped lisinopril.   Started losartan 50mg  q day.  Follow pressures.   Follow metabolic panel.

## 2013-10-03 NOTE — Assessment & Plan Note (Signed)
On synthroid.  Follow tsh.  Last tsh wnl.     

## 2013-10-12 ENCOUNTER — Ambulatory Visit (INDEPENDENT_AMBULATORY_CARE_PROVIDER_SITE_OTHER): Payer: Commercial Managed Care - HMO | Admitting: Pulmonary Disease

## 2013-10-12 ENCOUNTER — Encounter: Payer: Self-pay | Admitting: Pulmonary Disease

## 2013-10-12 VITALS — BP 106/62 | HR 58 | Ht 64.25 in | Wt 145.0 lb

## 2013-10-12 DIAGNOSIS — R05 Cough: Secondary | ICD-10-CM

## 2013-10-12 DIAGNOSIS — R059 Cough, unspecified: Secondary | ICD-10-CM | POA: Diagnosis not present

## 2013-10-12 NOTE — Assessment & Plan Note (Addendum)
Today Mrs. Tuggle does not have evidence of COPD on simple spirometry.  Her cough could be reasonably explained by initially a viral infection which was perpetuated by ACE inhibitor use. Her ACE inhibitor was appropriately discontinued several months ago and her cough has improved somewhat since then. However, the recent increase in cough frequency I believe is due to allergic rhinitis as she notes a significant amount of postnasal drip. Fortunately for her she does not have shortness of breath or and otherwise abnormal pulmonary exam or chest imaging to suggest an underlying pulmonary process. Today her ambulatory oximetry was completely normal.  Plan: - Stop inhaled corticosteroid -Treat cough as do to allergic rhinitis -Instructed to use generic cetirizine and over-the-counter Nasacort -Saline rinses as needed -Followup with me if no improvement

## 2013-10-12 NOTE — Progress Notes (Signed)
Subjective:    Patient ID: Michelle Hawkins, female    DOB: 10-15-1945, 68 y.o.   MRN: [id number]  HPI  Michelle Hawkins is here to see me for cough.  Recently (November) she had a bad upper respiratory infection which lead to a pretty severe cough.  She saw Dr. Nicki Reaper after a few weeks and she was noted to be wheezing.  A CXR suggested that her heart was enlarged but no clear pulmonary problem was noted other than hyperinflation suggestive of COPD.  She was treated with an antibiotic (x2 rounds) but the cough persisted.  Last month she had a viral infection of some sort and the cough got worse again.  Last month she was treated with a couple of new inhalers (Asmanex).  At some point in February an ACE inhibitor was stopped.    She never really had any shortness of breath with her illness.  She participates in aerobics classes at the [facility] 4 times per week.  Throughout this she only missed a few weeks (2-3) when she was sick.    As a child she had no lung problems.  No one in the family had lung problems.  She started smoking around age 71 years old and smoked 1/2 pack per day and quit in 2005.    She has a history of breast cancer treated 17 years ago with resection and chemotherapy.    The cough has nearly resolved now, but she thinks that is now due to a post nasal drip.  Previously her cough had mucus (light yellow), but now it is mostly dry.  Currently she coughs mostly in the morning.  She has ben taking benadryl.  She has been using saline nose sprays.    Past Medical History  Diagnosis Date  . Breast cancer 1996    s/p lumpectomy (lymph node dissection - 2/11 positive), chemotherapy  . Diabetes mellitus   . H/O ulcer disease     PUD  . GERD (gastroesophageal reflux disease)   . Hypercholesterolemia   . Nephrolithiasis   . Hypothyroidism     multinodular goiter  . Scoliosis   . Dysphagia   . SVT (supraventricular tachycardia)   . Allergic rhinitis      Family History  Problem Relation  Age of Onset  . Heart disease Father   . Hypertension Father   . Diabetes Father      History   Social History  . Marital Status: Married    Spouse Name: N/A    Number of Children: N/A  . Years of Education: N/A   Occupational History  . Not on file.   Social History Main Topics  . Smoking status: Former Smoker -- 0.50 packs/day for 40 years    Types: Cigarettes    Quit date: 04/14/2004  . Smokeless tobacco: Never Used  . Alcohol Use: No  . Drug Use: No  . Sexual Activity: Not on file   Other Topics Concern  . Not on file   Social History Narrative  . No narrative on file     No Known Allergies   Outpatient Prescriptions Prior to Visit  Medication Sig Dispense Refill  . ACCU-CHEK SOFTCLIX LANCETS lancets Check blood sugar twice daily Dx 250.00  200 each  1  . albuterol (PROVENTIL HFA;VENTOLIN HFA) 108 (90 BASE) MCG/ACT inhaler Inhale 2 puffs into the lungs every 6 (six) hours as needed for wheezing or shortness of breath.  1 Inhaler  1  . aspirin  81 MG tablet Take 81 mg by mouth 2 (two) times daily.       Marland Kitchen atorvastatin (LIPITOR) 20 MG tablet Take 1 tablet (20 mg total) by mouth daily.  90 tablet  1  . Biotin (BIOTIN 5000) 5 MG CAPS Take by mouth.      . Blood Glucose Monitoring Suppl (ONE TOUCH ULTRA SYSTEM KIT) W/DEVICE KIT 1 kit by Does not apply route once.  1 each  0  . calcium carbonate 200 MG capsule Take 250 mg by mouth 2 (two) times daily with a meal.      . Canagliflozin (INVOKANA) 100 MG TABS Take 1 tablet (100 mg total) by mouth daily.  90 tablet  1  . cholecalciferol (VITAMIN D) 400 UNITS TABS Take by mouth.      . escitalopram (LEXAPRO) 20 MG tablet Take 1 tablet (20 mg total) by mouth daily.  90 tablet  0  . fish oil-omega-3 fatty acids 1000 MG capsule Take 2 g by mouth daily.      Marland Kitchen glucose blood (ACCU-CHEK AVIVA PLUS) test strip Check blood sugar twice daily Dx 250.00  200 each  1  . levothyroxine (SYNTHROID, LEVOTHROID) 25 MCG tablet Take 1 tablet  (25 mcg total) by mouth daily.  90 tablet  1  . losartan (COZAAR) 100 MG tablet Take 1 tablet (100 mg total) by mouth daily.  90 tablet  1  . metFORMIN (GLUCOPHAGE) 500 MG tablet Take two tablets bid  360 tablet  3  . metoprolol succinate (TOPROL XL) 50 MG 24 hr tablet Take 1 tablet (50 mg total) by mouth daily. Take with or immediately following a meal.  90 tablet  1  . mupirocin ointment (BACTROBAN) 2 %       . omeprazole (PRILOSEC) 20 MG capsule Take 1 capsule (20 mg total) by mouth daily.  90 capsule  1  . TURMERIC PO Take by mouth daily.        No facility-administered medications prior to visit.      Review of Systems  Constitutional: Negative for fever and unexpected weight change.  HENT: Positive for congestion, postnasal drip and sinus pressure. Negative for dental problem, ear pain, nosebleeds, rhinorrhea, sneezing, sore throat and trouble swallowing.   Eyes: Negative for redness and itching.  Respiratory: Positive for cough. Negative for chest tightness, shortness of breath and wheezing.   Cardiovascular: Negative for palpitations and leg swelling.  Gastrointestinal: Negative for nausea and vomiting.  Genitourinary: Negative for dysuria.  Musculoskeletal: Negative for joint swelling.  Skin: Negative for rash.  Neurological: Negative for headaches.  Hematological: Does not bruise/bleed easily.  Psychiatric/Behavioral: Negative for dysphoric mood. The patient is not nervous/anxious.        Objective:   Physical Exam  Filed Vitals:   10/12/13 1008  BP: 106/62  Pulse: 58  Height: 5' 4.25" (1.632 m)  Weight: 145 lb (65.772 kg)  SpO2: 97%   RA  Gen: well appearing, no acute distress HEENT: NCAT, PERRL, EOMi, OP clear, neck supple without masses PULM: Rare inspiratory high-pitched wheeze throughout lungs, good air movement, no crackles CV: RRR, slight systolic murmur, no gallops or rubs, no JVD AB: BS+, soft, nontender, no hsm Ext: warm, no edema, no clubbing, no  cyanosis Derm: no rash or skin breakdown Neuro: A&Ox4, CN II-XII intact, strength 5/5 in all 4 extremities  October 2014 chest x-ray> question of cardiomegaly, hyperinflation suggestive of COPD 10/12/2013 simple spirometry> normal      Assessment &  Plan:   Cough Today Mrs. Michelini does not have evidence of COPD on simple spirometry.  Her cough could be reasonably explained by initially a viral infection which was perpetuated by ACE inhibitor use. Her ACE inhibitor was appropriately discontinued several months ago and her cough has improved somewhat since then. However, the recent increase in cough frequency I believe is due to allergic rhinitis as she notes a significant amount of postnasal drip. Fortunately for her she does not have shortness of breath or and otherwise abnormal pulmonary exam or chest imaging to suggest an underlying pulmonary process. Today her ambulatory oximetry was completely normal.  Plan: - Stop inhaled corticosteroid -Treat cough as do to allergic rhinitis -Instructed to use generic cetirizine and over-the-counter Nasacort -Saline rinses as needed -Followup with me if no improvement    Updated Medication List Outpatient Encounter Prescriptions as of 10/12/2013  Medication Sig  . ACCU-CHEK SOFTCLIX LANCETS lancets Check blood sugar twice daily Dx 250.00  . albuterol (PROVENTIL HFA;VENTOLIN HFA) 108 (90 BASE) MCG/ACT inhaler Inhale 2 puffs into the lungs every 6 (six) hours as needed for wheezing or shortness of breath.  Marland Kitchen aspirin 81 MG tablet Take 81 mg by mouth 2 (two) times daily.   Marland Kitchen atorvastatin (LIPITOR) 20 MG tablet Take 1 tablet (20 mg total) by mouth daily.  . Biotin (BIOTIN 5000) 5 MG CAPS Take by mouth.  . Blood Glucose Monitoring Suppl (ONE TOUCH ULTRA SYSTEM KIT) W/DEVICE KIT 1 kit by Does not apply route once.  . calcium carbonate 200 MG capsule Take 250 mg by mouth 2 (two) times daily with a meal.  . Canagliflozin (INVOKANA) 100 MG TABS Take 1  tablet (100 mg total) by mouth daily.  . cholecalciferol (VITAMIN D) 400 UNITS TABS Take by mouth.  . escitalopram (LEXAPRO) 20 MG tablet Take 1 tablet (20 mg total) by mouth daily.  . fish oil-omega-3 fatty acids 1000 MG capsule Take 2 g by mouth daily.  Marland Kitchen glucose blood (ACCU-CHEK AVIVA PLUS) test strip Check blood sugar twice daily Dx 250.00  . levothyroxine (SYNTHROID, LEVOTHROID) 25 MCG tablet Take 1 tablet (25 mcg total) by mouth daily.  Marland Kitchen losartan (COZAAR) 100 MG tablet Take 1 tablet (100 mg total) by mouth daily.  . metFORMIN (GLUCOPHAGE) 500 MG tablet Take two tablets bid  . metoprolol succinate (TOPROL XL) 50 MG 24 hr tablet Take 1 tablet (50 mg total) by mouth daily. Take with or immediately following a meal.  . Multiple Vitamin (MULTIVITAMIN WITH MINERALS) TABS tablet Take 1 tablet by mouth daily.  . mupirocin ointment (BACTROBAN) 2 %   . omeprazole (PRILOSEC) 20 MG capsule Take 1 capsule (20 mg total) by mouth daily.  . TURMERIC PO Take by mouth daily.

## 2013-10-12 NOTE — Patient Instructions (Signed)
For the allergies: Use generic zyrtec daily Use Nasacort over the counter 2 sprays each nostril daily Use Saline sprays or rinses (I like Neil Med rinse) daily or twice per day  We will see you back if you don't get better.

## 2013-11-02 ENCOUNTER — Other Ambulatory Visit: Payer: Self-pay | Admitting: Internal Medicine

## 2013-11-02 ENCOUNTER — Other Ambulatory Visit (INDEPENDENT_AMBULATORY_CARE_PROVIDER_SITE_OTHER): Payer: Medicare HMO

## 2013-11-02 DIAGNOSIS — D721 Eosinophilia, unspecified: Secondary | ICD-10-CM

## 2013-11-02 DIAGNOSIS — C50919 Malignant neoplasm of unspecified site of unspecified female breast: Secondary | ICD-10-CM

## 2013-11-02 DIAGNOSIS — I10 Essential (primary) hypertension: Secondary | ICD-10-CM

## 2013-11-02 DIAGNOSIS — E875 Hyperkalemia: Secondary | ICD-10-CM

## 2013-11-02 DIAGNOSIS — E7800 Pure hypercholesterolemia, unspecified: Secondary | ICD-10-CM

## 2013-11-02 DIAGNOSIS — E119 Type 2 diabetes mellitus without complications: Secondary | ICD-10-CM

## 2013-11-02 LAB — BASIC METABOLIC PANEL
BUN: 14 mg/dL (ref 6–23)
CALCIUM: 9.8 mg/dL (ref 8.4–10.5)
CO2: 23 meq/L (ref 19–32)
Chloride: 101 mEq/L (ref 96–112)
Creatinine, Ser: 0.6 mg/dL (ref 0.4–1.2)
GFR: 97.91 mL/min (ref >60.00)
GLUCOSE: 105 mg/dL — AB (ref 70–99)
Potassium: 5.2 mEq/L — ABNORMAL HIGH (ref 3.5–5.1)
SODIUM: 138 meq/L (ref 135–145)

## 2013-11-02 LAB — CBC WITH DIFFERENTIAL/PLATELET
Basophils Absolute: 0.2 10*3/uL — ABNORMAL HIGH (ref 0.0–0.1)
Basophils Relative: 1.8 % (ref 0.0–3.0)
EOS ABS: 1.3 10*3/uL — AB (ref 0.0–0.7)
Eosinophils Relative: 13.8 % — ABNORMAL HIGH (ref 0.0–5.0)
HCT: 37.5 % (ref 36.0–46.0)
HEMOGLOBIN: 12 g/dL (ref 12.0–15.0)
LYMPHS ABS: 2.6 10*3/uL (ref 0.7–4.0)
LYMPHS PCT: 26.4 % (ref 12.0–46.0)
MCHC: 32 g/dL (ref 30.0–36.0)
MCV: 82.7 fl (ref 78.0–100.0)
Monocytes Absolute: 0.9 10*3/uL (ref 0.1–1.0)
Monocytes Relative: 9.3 % (ref 3.0–12.0)
NEUTROS ABS: 4.7 10*3/uL (ref 1.4–7.7)
Neutrophils Relative %: 48.7 % (ref 43.0–77.0)
Platelets: 347 10*3/uL (ref 150.0–400.0)
RBC: 4.53 Mil/uL (ref 3.87–5.11)
RDW: 15.6 % — ABNORMAL HIGH (ref 11.5–14.6)
WBC: 9.7 10*3/uL (ref 4.5–10.5)

## 2013-11-02 LAB — LIPID PANEL
CHOL/HDL RATIO: 3
Cholesterol: 172 mg/dL (ref 0–200)
HDL: 64.2 mg/dL (ref >39.00)
LDL Cholesterol: 97 mg/dL (ref 0–99)
Triglycerides: 52 mg/dL (ref 0.0–149.0)
VLDL: 10.4 mg/dL (ref 0.0–40.0)

## 2013-11-02 LAB — HEPATIC FUNCTION PANEL
ALT: 25 U/L (ref 0–35)
AST: 28 U/L (ref 0–37)
Albumin: 4 g/dL (ref 3.5–5.2)
Alkaline Phosphatase: 63 U/L (ref 39–117)
BILIRUBIN DIRECT: 0.1 mg/dL (ref 0.0–0.3)
BILIRUBIN TOTAL: 0.3 mg/dL (ref 0.3–1.2)
Total Protein: 7.2 g/dL (ref 6.0–8.3)

## 2013-11-02 LAB — HEMOGLOBIN A1C: Hgb A1c MFr Bld: 7 % — ABNORMAL HIGH (ref 4.6–6.5)

## 2013-11-02 NOTE — Progress Notes (Signed)
Order placed for f/u labs.  

## 2013-11-11 ENCOUNTER — Other Ambulatory Visit: Payer: Self-pay | Admitting: Internal Medicine

## 2013-11-11 ENCOUNTER — Other Ambulatory Visit (INDEPENDENT_AMBULATORY_CARE_PROVIDER_SITE_OTHER): Payer: Commercial Managed Care - HMO

## 2013-11-11 ENCOUNTER — Telehealth: Payer: Self-pay | Admitting: Internal Medicine

## 2013-11-11 DIAGNOSIS — D721 Eosinophilia, unspecified: Secondary | ICD-10-CM

## 2013-11-11 DIAGNOSIS — E875 Hyperkalemia: Secondary | ICD-10-CM

## 2013-11-11 DIAGNOSIS — D473 Essential (hemorrhagic) thrombocythemia: Secondary | ICD-10-CM

## 2013-11-11 DIAGNOSIS — D75839 Thrombocytosis, unspecified: Secondary | ICD-10-CM

## 2013-11-11 LAB — CBC WITH DIFFERENTIAL/PLATELET
BASOS ABS: 0.1 10*3/uL (ref 0.0–0.1)
Basophils Relative: 0.9 % (ref 0.0–3.0)
EOS ABS: 1.1 10*3/uL — AB (ref 0.0–0.7)
Eosinophils Relative: 10.4 % — ABNORMAL HIGH (ref 0.0–5.0)
HCT: 37.1 % (ref 36.0–46.0)
Hemoglobin: 12 g/dL (ref 12.0–15.0)
Lymphocytes Relative: 29.6 % (ref 12.0–46.0)
Lymphs Abs: 3 10*3/uL (ref 0.7–4.0)
MCHC: 32.3 g/dL (ref 30.0–36.0)
MCV: 83.1 fl (ref 78.0–100.0)
MONO ABS: 1.2 10*3/uL — AB (ref 0.1–1.0)
Monocytes Relative: 11.9 % (ref 3.0–12.0)
NEUTROS PCT: 47.2 % (ref 43.0–77.0)
Neutro Abs: 4.8 10*3/uL (ref 1.4–7.7)
Platelets: 432 10*3/uL — ABNORMAL HIGH (ref 150.0–400.0)
RBC: 4.47 Mil/uL (ref 3.87–5.11)
RDW: 15.9 % — AB (ref 11.5–15.5)
WBC: 10.1 10*3/uL (ref 4.0–10.5)

## 2013-11-11 LAB — POTASSIUM: Potassium: 4.8 mEq/L (ref 3.5–5.1)

## 2013-11-11 NOTE — Telephone Encounter (Signed)
Pt wanted to know if you have any samples of invokana  That she could get

## 2013-11-11 NOTE — Progress Notes (Signed)
Order placed for f/u platelet count.  

## 2013-11-11 NOTE — Telephone Encounter (Signed)
Left message on voicemail notifying patient that we do not have any Invokana, and we are no longer accepting samples at this practice.

## 2013-11-12 ENCOUNTER — Encounter: Payer: Self-pay | Admitting: *Deleted

## 2013-11-13 ENCOUNTER — Other Ambulatory Visit: Payer: Self-pay | Admitting: *Deleted

## 2013-11-13 MED ORDER — ESCITALOPRAM OXALATE 20 MG PO TABS: 20.0000 mg | ORAL_TABLET | Freq: Every day | ORAL | Status: DC

## 2013-11-19 ENCOUNTER — Telehealth: Payer: Self-pay | Admitting: *Deleted

## 2013-11-19 NOTE — Telephone Encounter (Signed)
Pt has been on lexapro 20mg  q day.  She is to continue on this dose for now.

## 2013-11-19 NOTE — Telephone Encounter (Signed)
Pharmacy Note:  New Rx for Escitalopram 20 mg for a total of 20mg /day. Maximum recommended dosage is 10 mg/day geriatric, please clarify dosing and directions

## 2013-11-19 NOTE — Telephone Encounter (Signed)
Rx for escitalopram 20mg /day. Maximum recommended dose is 10mg /day geriatric. Please clarify dose and directions.

## 2013-11-20 MED ORDER — ESCITALOPRAM OXALATE 20 MG PO TABS: 20.0000 mg | ORAL_TABLET | Freq: Every day | ORAL | Status: DC

## 2013-11-20 NOTE — Telephone Encounter (Signed)
Pharmacy notified.

## 2013-11-26 ENCOUNTER — Other Ambulatory Visit: Payer: Commercial Managed Care - HMO

## 2013-11-26 DIAGNOSIS — D75839 Thrombocytosis, unspecified: Secondary | ICD-10-CM

## 2013-11-26 DIAGNOSIS — D473 Essential (hemorrhagic) thrombocythemia: Secondary | ICD-10-CM

## 2013-11-26 LAB — PLATELET COUNT: Platelets: 439 10*3/uL — ABNORMAL HIGH (ref 150–400)

## 2013-11-27 ENCOUNTER — Other Ambulatory Visit: Payer: Self-pay | Admitting: Internal Medicine

## 2013-11-27 ENCOUNTER — Encounter: Payer: Self-pay | Admitting: *Deleted

## 2013-11-27 DIAGNOSIS — D75839 Thrombocytosis, unspecified: Secondary | ICD-10-CM

## 2013-11-27 DIAGNOSIS — D473 Essential (hemorrhagic) thrombocythemia: Secondary | ICD-10-CM

## 2013-11-27 NOTE — Progress Notes (Signed)
Order placed for f/u platelet count.  

## 2013-12-24 ENCOUNTER — Other Ambulatory Visit (INDEPENDENT_AMBULATORY_CARE_PROVIDER_SITE_OTHER): Payer: Commercial Managed Care - HMO

## 2013-12-24 DIAGNOSIS — D473 Essential (hemorrhagic) thrombocythemia: Secondary | ICD-10-CM

## 2013-12-24 DIAGNOSIS — D75839 Thrombocytosis, unspecified: Secondary | ICD-10-CM

## 2013-12-24 LAB — PLATELET COUNT: Platelets: 466 10*3/uL — ABNORMAL HIGH (ref 150–400)

## 2014-01-01 ENCOUNTER — Encounter: Payer: Self-pay | Admitting: Internal Medicine

## 2014-01-01 ENCOUNTER — Ambulatory Visit (INDEPENDENT_AMBULATORY_CARE_PROVIDER_SITE_OTHER): Payer: Commercial Managed Care - HMO | Admitting: Internal Medicine

## 2014-01-01 VITALS — BP 130/70 | HR 58 | Temp 98.2°F | Ht 64.25 in | Wt 142.8 lb

## 2014-01-01 DIAGNOSIS — D473 Essential (hemorrhagic) thrombocythemia: Secondary | ICD-10-CM

## 2014-01-01 DIAGNOSIS — N899 Noninflammatory disorder of vagina, unspecified: Secondary | ICD-10-CM

## 2014-01-01 DIAGNOSIS — I10 Essential (primary) hypertension: Secondary | ICD-10-CM

## 2014-01-01 DIAGNOSIS — E039 Hypothyroidism, unspecified: Secondary | ICD-10-CM

## 2014-01-01 DIAGNOSIS — K219 Gastro-esophageal reflux disease without esophagitis: Secondary | ICD-10-CM

## 2014-01-01 DIAGNOSIS — R05 Cough: Secondary | ICD-10-CM

## 2014-01-01 DIAGNOSIS — R059 Cough, unspecified: Secondary | ICD-10-CM

## 2014-01-01 DIAGNOSIS — E7800 Pure hypercholesterolemia, unspecified: Secondary | ICD-10-CM

## 2014-01-01 DIAGNOSIS — E119 Type 2 diabetes mellitus without complications: Secondary | ICD-10-CM

## 2014-01-01 DIAGNOSIS — N898 Other specified noninflammatory disorders of vagina: Secondary | ICD-10-CM

## 2014-01-01 DIAGNOSIS — D75839 Thrombocytosis, unspecified: Secondary | ICD-10-CM

## 2014-01-01 DIAGNOSIS — E871 Hypo-osmolality and hyponatremia: Secondary | ICD-10-CM

## 2014-01-01 DIAGNOSIS — C50919 Malignant neoplasm of unspecified site of unspecified female breast: Secondary | ICD-10-CM

## 2014-01-01 MED ORDER — NYSTATIN 100000 UNIT/GM EX CREA: 1.0000 "application " | TOPICAL_CREAM | Freq: Two times a day (BID) | CUTANEOUS | Status: DC

## 2014-01-02 ENCOUNTER — Encounter: Payer: Self-pay | Admitting: Internal Medicine

## 2014-01-02 DIAGNOSIS — D75839 Thrombocytosis, unspecified: Secondary | ICD-10-CM | POA: Insufficient documentation

## 2014-01-02 DIAGNOSIS — D473 Essential (hemorrhagic) thrombocythemia: Secondary | ICD-10-CM | POA: Insufficient documentation

## 2014-01-02 DIAGNOSIS — N898 Other specified noninflammatory disorders of vagina: Secondary | ICD-10-CM | POA: Insufficient documentation

## 2014-01-02 MED ORDER — ATORVASTATIN CALCIUM 20 MG PO TABS: 20.0000 mg | ORAL_TABLET | Freq: Every day | ORAL | Status: DC

## 2014-01-02 MED ORDER — METFORMIN HCL 500 MG PO TABS: ORAL_TABLET | ORAL | Status: DC

## 2014-01-02 MED ORDER — METOPROLOL SUCCINATE ER 50 MG PO TB24: 50.0000 mg | ORAL_TABLET | Freq: Every day | ORAL | Status: DC

## 2014-01-02 MED ORDER — ESCITALOPRAM OXALATE 20 MG PO TABS: 20.0000 mg | ORAL_TABLET | Freq: Every day | ORAL | Status: DC

## 2014-01-02 MED ORDER — LOSARTAN POTASSIUM 100 MG PO TABS: 100.0000 mg | ORAL_TABLET | Freq: Every day | ORAL | Status: DC

## 2014-01-02 MED ORDER — LEVOTHYROXINE SODIUM 25 MCG PO TABS: 25.0000 ug | ORAL_TABLET | Freq: Every day | ORAL | Status: DC

## 2014-01-02 MED ORDER — OMEPRAZOLE 20 MG PO CPDR: 20.0000 mg | DELAYED_RELEASE_CAPSULE | Freq: Every day | ORAL | Status: DC

## 2014-01-02 MED ORDER — CANAGLIFLOZIN 100 MG PO TABS: 100.0000 mg | ORAL_TABLET | Freq: Every day | ORAL | Status: DC

## 2014-01-02 NOTE — Progress Notes (Signed)
Subjective:    Patient ID: Michelle Hawkins, female    DOB: Jun 07, 1946, 68 y.o.   MRN: [id number]  HPI 68 year old female with past history of breast cancer (s/p chemotherapy/XRT), hypertension, diabetes and hypercholesterolemia who comes in today for a scheduled follow up.  Sugars improved.   Is followed at [facility].  Sugars attached.  States she is eating more sweets.  She has done well with Invokana.  No fever.   No chest pain or tightness.  No increased acid reflux.  Cough is better.  No chest congestion.   No vomiting.  No dysphagia.  No bowel change.  She does report some peri vaginal irritation.  Some itching.  No intravaginal itching or irritation.  No discharge.  Going to the gym.     Past Medical History  Diagnosis Date  . Breast cancer 1996    s/p lumpectomy (lymph node dissection - 2/11 positive), chemotherapy  . Diabetes mellitus   . H/O ulcer disease     PUD  . GERD (gastroesophageal reflux disease)   . Hypercholesterolemia   . Nephrolithiasis   . Hypothyroidism     multinodular goiter  . Scoliosis   . Dysphagia   . SVT (supraventricular tachycardia)   . Allergic rhinitis     Current Outpatient Prescriptions on File Prior to Visit  Medication Sig Dispense Refill  . ACCU-CHEK SOFTCLIX LANCETS lancets Check blood sugar twice daily Dx 250.00  200 each  1  . albuterol (PROVENTIL HFA;VENTOLIN HFA) 108 (90 BASE) MCG/ACT inhaler Inhale 2 puffs into the lungs every 6 (six) hours as needed for wheezing or shortness of breath.  1 Inhaler  1  . aspirin 81 MG tablet Take 81 mg by mouth 2 (two) times daily.       Marland Kitchen atorvastatin (LIPITOR) 20 MG tablet Take 1 tablet (20 mg total) by mouth daily.  90 tablet  1  . Biotin (BIOTIN 5000) 5 MG CAPS Take by mouth.      . Blood Glucose Monitoring Suppl (ONE TOUCH ULTRA SYSTEM KIT) W/DEVICE KIT 1 kit by Does not apply route once.  1 each  0  . calcium carbonate 200 MG capsule Take 250 mg by mouth 2 (two) times daily with a meal.      .  Canagliflozin (INVOKANA) 100 MG TABS Take 1 tablet (100 mg total) by mouth daily.  90 tablet  1  . cholecalciferol (VITAMIN D) 400 UNITS TABS Take by mouth.      . escitalopram (LEXAPRO) 20 MG tablet Take 1 tablet (20 mg total) by mouth daily.  90 tablet  0  . fish oil-omega-3 fatty acids 1000 MG capsule Take 2 g by mouth daily.      Marland Kitchen glucose blood (ACCU-CHEK AVIVA PLUS) test strip Check blood sugar twice daily Dx 250.00  200 each  1  . levothyroxine (SYNTHROID, LEVOTHROID) 25 MCG tablet Take 1 tablet (25 mcg total) by mouth daily.  90 tablet  1  . losartan (COZAAR) 100 MG tablet Take 1 tablet (100 mg total) by mouth daily.  90 tablet  1  . metFORMIN (GLUCOPHAGE) 500 MG tablet Take two tablets bid  360 tablet  3  . metoprolol succinate (TOPROL XL) 50 MG 24 hr tablet Take 1 tablet (50 mg total) by mouth daily. Take with or immediately following a meal.  90 tablet  1  . Multiple Vitamin (MULTIVITAMIN WITH MINERALS) TABS tablet Take 1 tablet by mouth daily.      Marland Kitchen  mupirocin ointment (BACTROBAN) 2 %       . omeprazole (PRILOSEC) 20 MG capsule Take 1 capsule (20 mg total) by mouth daily.  90 capsule  1  . TURMERIC PO Take by mouth daily.        No current facility-administered medications on file prior to visit.    Review of Systems Patient denies any headache, lightheadedness or dizziness.   No chest pain, tightness or palpitations.  No increased shortness of breath.  No nausea or vomiting.  No increased acid reflux reported.   Cough is better.  No abdominal pain or cramping.  No bowel change, such as diarrhea, constipation, BRBPR or melana.  No urine change.   Doing well on lexapro.  Sugar readings attached.  Does report some perivaginal irritation.        Objective:   Physical Exam  Filed Vitals:   01/01/14 1102  BP: 130/70  Pulse: 58  Temp: 98.2 F (44.54 C)   68 year old female in no acute distress.   HEENT:  Nares- clear.  Oropharynx - without lesions. NECK:  Supple.  Nontender.  No  audible bruit.  HEART:  Appears to be regular. LUNGS:  No crackles or wheezing audible.  Respirations even and unlabored.  RADIAL PULSE:  Equal bilaterally.   ABDOMEN:  Soft, nontender.  Bowel sounds present and normal.  No audible abdominal bruit.  GU:  Increased erythema peri vaginal region (appears to be c/w yeast infection).     EXTREMITIES:  No increased edema present.  DP pulses palpable and equal bilaterally.      FEET:  Without lesions.      Assessment & Plan:  INCREASED STRESS/ANXIETY.  On lexapro.  Doing better.   Sodium just checked and normal.    ANEMIA.  Follow cbc and ferritin.  Last check wnl.   ABNORMAL MAMMOGRAM.  Referred to Dr Bary Castilla.  S/p biopsy.  Everything checked out fine.  Per review of Dr Dwyane Luo note, states was due 6 month f/u mammo in 6 months.  Had mammogram 09/17/13 [facility].       HEALTH MAINTENANCE.  Physical 10/11/13.  She is s/p hysterectomy.   Mammogram 09/23/11 [facility].  Most recent mammo - [facility] 4.  Referred to Dr Bary Castilla.  S/p biopsy.  Everything checked out fine.  See above.   Last mammogram 09/17/13 [facility].  Last colonoscopy 10/14/12 revealed internal hemorrhoids otherwise normal.  Recommended follow up colonoscopy in 10 years.   I spent 25 minutes with the patient and more than 50% of the time was spent in consultation regarding the above.

## 2014-01-02 NOTE — Assessment & Plan Note (Signed)
Exam as outlined.  Nystatin cream as directed.  Follow.

## 2014-01-02 NOTE — Assessment & Plan Note (Signed)
Symptoms controlled on omeprazole.   

## 2014-01-02 NOTE — Assessment & Plan Note (Signed)
Platelet count stable.  Follow.  Recheck with next labs.

## 2014-01-02 NOTE — Assessment & Plan Note (Signed)
Sodium last checked and wnl.

## 2014-01-02 NOTE — Assessment & Plan Note (Signed)
Blood pressure under good control.  Continue same medication regimen.   Follow metabolic panel.

## 2014-01-02 NOTE — Assessment & Plan Note (Signed)
Better.  Saw Dr Lake Bells.  Felt to be allergy triggered.  Follow.

## 2014-01-02 NOTE — Assessment & Plan Note (Signed)
Sugars as outlined.  Doing well with the added Invokana.  Continue diet adjustment.  Follow sugars.  Up to date with eye checks.  Follow met b and a1c.

## 2014-01-02 NOTE — Assessment & Plan Note (Signed)
On synthroid.  Follow tsh.  Last tsh wnl.

## 2014-01-02 NOTE — Assessment & Plan Note (Signed)
Previously saw Dr Bary Castilla and Dr Oliva Bustard.  Had been released.  Mammogram 09/13/11 - [facility] II.  Recent abnormal mammogram.  Saw Dr Bary Castilla.  Biopsy ok.  Last mammogram 09/17/13 - [facility].

## 2014-01-02 NOTE — Assessment & Plan Note (Signed)
On lipitor.  Low cholesterol diet and exercise.  Follow lipid panel and liver function.

## 2014-01-25 ENCOUNTER — Other Ambulatory Visit: Payer: Self-pay | Admitting: *Deleted

## 2014-01-25 MED ORDER — LEVOTHYROXINE SODIUM 25 MCG PO TABS: 25.0000 ug | ORAL_TABLET | Freq: Every day | ORAL | Status: DC

## 2014-01-25 MED ORDER — ATORVASTATIN CALCIUM 20 MG PO TABS: 20.0000 mg | ORAL_TABLET | Freq: Every day | ORAL | Status: DC

## 2014-01-25 MED ORDER — LOSARTAN POTASSIUM 100 MG PO TABS: 100.0000 mg | ORAL_TABLET | Freq: Every day | ORAL | Status: DC

## 2014-01-25 MED ORDER — ESCITALOPRAM OXALATE 20 MG PO TABS: 20.0000 mg | ORAL_TABLET | Freq: Every day | ORAL | Status: DC

## 2014-03-29 ENCOUNTER — Other Ambulatory Visit (INDEPENDENT_AMBULATORY_CARE_PROVIDER_SITE_OTHER): Payer: Commercial Managed Care - HMO

## 2014-03-29 DIAGNOSIS — E119 Type 2 diabetes mellitus without complications: Secondary | ICD-10-CM

## 2014-03-29 DIAGNOSIS — D75839 Thrombocytosis, unspecified: Secondary | ICD-10-CM

## 2014-03-29 DIAGNOSIS — E7800 Pure hypercholesterolemia, unspecified: Secondary | ICD-10-CM

## 2014-03-29 DIAGNOSIS — D473 Essential (hemorrhagic) thrombocythemia: Secondary | ICD-10-CM

## 2014-03-29 DIAGNOSIS — I10 Essential (primary) hypertension: Secondary | ICD-10-CM

## 2014-03-29 DIAGNOSIS — C50919 Malignant neoplasm of unspecified site of unspecified female breast: Secondary | ICD-10-CM

## 2014-03-29 LAB — CBC WITH DIFFERENTIAL/PLATELET
BASOS PCT: 1.1 % (ref 0.0–3.0)
Basophils Absolute: 0.1 10*3/uL (ref 0.0–0.1)
Eosinophils Absolute: 0.9 10*3/uL — ABNORMAL HIGH (ref 0.0–0.7)
Eosinophils Relative: 9.3 % — ABNORMAL HIGH (ref 0.0–5.0)
HEMATOCRIT: 35.8 % — AB (ref 36.0–46.0)
Hemoglobin: 11.2 g/dL — ABNORMAL LOW (ref 12.0–15.0)
LYMPHS ABS: 2.7 10*3/uL (ref 0.7–4.0)
Lymphocytes Relative: 29 % (ref 12.0–46.0)
MCHC: 31.4 g/dL (ref 30.0–36.0)
MCV: 78.6 fl (ref 78.0–100.0)
MONO ABS: 1 10*3/uL (ref 0.1–1.0)
Monocytes Relative: 11.2 % (ref 3.0–12.0)
Neutro Abs: 4.6 10*3/uL (ref 1.4–7.7)
Neutrophils Relative %: 49.4 % (ref 43.0–77.0)
Platelets: 385 10*3/uL (ref 150.0–400.0)
RBC: 4.55 Mil/uL (ref 3.87–5.11)
RDW: 18 % — ABNORMAL HIGH (ref 11.5–15.5)
WBC: 9.3 10*3/uL (ref 4.0–10.5)

## 2014-03-29 LAB — LIPID PANEL
Cholesterol: 172 mg/dL (ref 0–200)
HDL: 57.2 mg/dL (ref >39.00)
LDL Cholesterol: 97 mg/dL (ref 0–99)
NONHDL: 114.8
Total CHOL/HDL Ratio: 3
Triglycerides: 90 mg/dL (ref 0.0–149.0)
VLDL: 18 mg/dL (ref 0.0–40.0)

## 2014-03-29 LAB — HEPATIC FUNCTION PANEL
ALK PHOS: 77 U/L (ref 39–117)
ALT: 22 U/L (ref 0–35)
AST: 25 U/L (ref 0–37)
Albumin: 3.8 g/dL (ref 3.5–5.2)
BILIRUBIN TOTAL: 0.5 mg/dL (ref 0.2–1.2)
Bilirubin, Direct: 0 mg/dL (ref 0.0–0.3)
Total Protein: 7.6 g/dL (ref 6.0–8.3)

## 2014-03-29 LAB — BASIC METABOLIC PANEL
BUN: 17 mg/dL (ref 6–23)
CALCIUM: 9.2 mg/dL (ref 8.4–10.5)
CHLORIDE: 100 meq/L (ref 96–112)
CO2: 27 meq/L (ref 19–32)
CREATININE: 0.7 mg/dL (ref 0.4–1.2)
GFR: 89.66 mL/min (ref >60.00)
GLUCOSE: 128 mg/dL — AB (ref 70–99)
Potassium: 4.5 mEq/L (ref 3.5–5.1)
SODIUM: 136 meq/L (ref 135–145)

## 2014-03-29 LAB — HEMOGLOBIN A1C: Hgb A1c MFr Bld: 7.5 % — ABNORMAL HIGH (ref 4.6–6.5)

## 2014-03-29 LAB — TSH: TSH: 1.37 u[IU]/mL (ref 0.35–4.50)

## 2014-03-31 ENCOUNTER — Ambulatory Visit (INDEPENDENT_AMBULATORY_CARE_PROVIDER_SITE_OTHER): Payer: Commercial Managed Care - HMO | Admitting: Internal Medicine

## 2014-03-31 ENCOUNTER — Encounter: Payer: Self-pay | Admitting: Internal Medicine

## 2014-03-31 VITALS — BP 110/70 | HR 57 | Temp 98.5°F | Ht 64.25 in | Wt 145.2 lb

## 2014-03-31 DIAGNOSIS — D473 Essential (hemorrhagic) thrombocythemia: Secondary | ICD-10-CM

## 2014-03-31 DIAGNOSIS — E871 Hypo-osmolality and hyponatremia: Secondary | ICD-10-CM

## 2014-03-31 DIAGNOSIS — R059 Cough, unspecified: Secondary | ICD-10-CM

## 2014-03-31 DIAGNOSIS — D75839 Thrombocytosis, unspecified: Secondary | ICD-10-CM

## 2014-03-31 DIAGNOSIS — R05 Cough: Secondary | ICD-10-CM

## 2014-03-31 DIAGNOSIS — D509 Iron deficiency anemia, unspecified: Secondary | ICD-10-CM | POA: Insufficient documentation

## 2014-03-31 DIAGNOSIS — K219 Gastro-esophageal reflux disease without esophagitis: Secondary | ICD-10-CM

## 2014-03-31 DIAGNOSIS — E119 Type 2 diabetes mellitus without complications: Secondary | ICD-10-CM

## 2014-03-31 DIAGNOSIS — I10 Essential (primary) hypertension: Secondary | ICD-10-CM

## 2014-03-31 DIAGNOSIS — E039 Hypothyroidism, unspecified: Secondary | ICD-10-CM

## 2014-03-31 DIAGNOSIS — Z23 Encounter for immunization: Secondary | ICD-10-CM

## 2014-03-31 DIAGNOSIS — D649 Anemia, unspecified: Secondary | ICD-10-CM

## 2014-03-31 DIAGNOSIS — E7800 Pure hypercholesterolemia, unspecified: Secondary | ICD-10-CM

## 2014-03-31 DIAGNOSIS — C50919 Malignant neoplasm of unspecified site of unspecified female breast: Secondary | ICD-10-CM

## 2014-03-31 NOTE — Assessment & Plan Note (Signed)
hgb just checked and slightly decreased.  Recheck cbc, iron studies and B12 in a few weeks.  May be contributing to some fatigue.  Follow.

## 2014-03-31 NOTE — Progress Notes (Signed)
Pre visit review using our clinic review tool, if applicable. No additional management support is needed unless otherwise documented below in the visit note. 

## 2014-03-31 NOTE — Assessment & Plan Note (Signed)
Sodium just checked and wnl.

## 2014-03-31 NOTE — Assessment & Plan Note (Signed)
Previously saw Dr Bary Castilla and Dr Oliva Bustard.  Had been released.  Mammogram 09/13/11 - [facility] II.  Recent abnormal mammogram.  Saw Dr Bary Castilla.  Biopsy ok.  Last mammogram 09/17/13 - [facility].

## 2014-03-31 NOTE — Assessment & Plan Note (Signed)
On lipitor.  Low cholesterol diet and exercise.  Follow lipid panel and liver function.  Recent cholesterol check wnl.

## 2014-03-31 NOTE — Assessment & Plan Note (Signed)
Symptoms controlled on omeprazole.   

## 2014-03-31 NOTE — Assessment & Plan Note (Signed)
Platelet count 03/29/14 wnl.

## 2014-03-31 NOTE — Assessment & Plan Note (Signed)
Blood pressure under good control.  Continue same medication regimen.   Follow metabolic panel.

## 2014-03-31 NOTE — Assessment & Plan Note (Signed)
On synthroid.  Follow tsh.  Last tsh wnl.

## 2014-03-31 NOTE — Assessment & Plan Note (Signed)
Sugars attached.  Has been doing well with the added Invokana.  Continue diet adjustment.  Follow sugars.  Up to date with eye checks.  Follow met b and a1c.  Eat a snack before bed.

## 2014-03-31 NOTE — Assessment & Plan Note (Signed)
Better.  Saw Dr Lake Bells.  Felt to be allergy triggered.  Having some drainage now.  nasacort and saline nasal spray as outlined.  Follow.  Notify me if persistent problems.

## 2014-03-31 NOTE — Progress Notes (Signed)
Subjective:    Patient ID: Michelle Hawkins, female    DOB: 08-05-1945, 68 y.o.   MRN: [id number]  HPI 68 year old female with past history of breast cancer (s/p chemotherapy/XRT), hypertension, diabetes and hypercholesterolemia who comes in today for a scheduled follow up.  Sugars attached.  States she is eating more sweets. Her a1c is elevated from last check, but recorded sugars appear to be doing well.   She has done well with Invokana.  No fever.   No chest pain or tightness.  No increased acid reflux.  Cough is better. Some minimal nasal congestion and drainage now.   No chest congestion.   No sob.  No vomiting.  No dysphagia.  No bowel change. Does report some increased fatigue.  She is going to exercise class three times per week.     Past Medical History  Diagnosis Date  . Breast cancer 1996    s/p lumpectomy (lymph node dissection - 2/11 positive), chemotherapy  . Diabetes mellitus   . H/O ulcer disease     PUD  . GERD (gastroesophageal reflux disease)   . Hypercholesterolemia   . Nephrolithiasis   . Hypothyroidism     multinodular goiter  . Scoliosis   . Dysphagia   . SVT (supraventricular tachycardia)   . Allergic rhinitis     Current Outpatient Prescriptions on File Prior to Visit  Medication Sig Dispense Refill  . ACCU-CHEK SOFTCLIX LANCETS lancets Check blood sugar twice daily Dx 250.00  200 each  1  . aspirin 81 MG tablet Take 81 mg by mouth 2 (two) times daily.       Marland Kitchen atorvastatin (LIPITOR) 20 MG tablet Take 1 tablet (20 mg total) by mouth daily.  90 tablet  0  . Biotin (BIOTIN 5000) 5 MG CAPS Take by mouth.      . Blood Glucose Monitoring Suppl (ONE TOUCH ULTRA SYSTEM KIT) W/DEVICE KIT 1 kit by Does not apply route once.  1 each  0  . calcium carbonate 200 MG capsule Take 250 mg by mouth 2 (two) times daily with a meal.      . Canagliflozin (INVOKANA) 100 MG TABS Take 1 tablet (100 mg total) by mouth daily.  90 tablet  3  . cholecalciferol (VITAMIN D) 400 UNITS TABS  Take by mouth.      . escitalopram (LEXAPRO) 20 MG tablet Take 1 tablet (20 mg total) by mouth daily.  90 tablet  1  . fish oil-omega-3 fatty acids 1000 MG capsule Take 2 g by mouth daily.      Marland Kitchen glucose blood (ACCU-CHEK AVIVA PLUS) test strip Check blood sugar twice daily Dx 250.00  200 each  1  . levothyroxine (SYNTHROID, LEVOTHROID) 25 MCG tablet Take 1 tablet (25 mcg total) by mouth daily.  90 tablet  0  . losartan (COZAAR) 100 MG tablet Take 1 tablet (100 mg total) by mouth daily.  90 tablet  0  . metFORMIN (GLUCOPHAGE) 500 MG tablet Take two tablets bid  360 tablet  3  . metoprolol succinate (TOPROL XL) 50 MG 24 hr tablet Take 1 tablet (50 mg total) by mouth daily. Take with or immediately following a meal.  90 tablet  3  . Multiple Vitamin (MULTIVITAMIN WITH MINERALS) TABS tablet Take 1 tablet by mouth daily.      . mupirocin ointment (BACTROBAN) 2 %       . nystatin cream (MYCOSTATIN) Apply 1 application topically 2 (two) times daily.  30 g  0  . omeprazole (PRILOSEC) 20 MG capsule Take 1 capsule (20 mg total) by mouth daily.  90 capsule  1  . TURMERIC PO Take by mouth daily.        No current facility-administered medications on file prior to visit.    Review of Systems Patient denies any headache, lightheadedness or dizziness.   No chest pain, tightness or palpitations.  No increased shortness of breath.  No nausea or vomiting.  No increased acid reflux reported.   Cough is better.  No abdominal pain or cramping.  No bowel change, such as diarrhea, constipation, BRBPR or melana.  No urine change.   Doing well on lexapro.  Sugar readings attached.  Does report some perivaginal irritation.        Objective:   Physical Exam  Filed Vitals:   03/31/14 1108  BP: 110/70  Pulse: 57  Temp: 98.5 F (36.9 C)   Blood pressure recheck:  128/64, pulse 23  67 year old female in no acute distress.   HEENT:  Nares- clear.  Oropharynx - without lesions. NECK:  Supple.  Nontender.  No  audible bruit.  HEART:  Appears to be regular. LUNGS:  No crackles or wheezing audible.  Respirations even and unlabored.  RADIAL PULSE:  Equal bilaterally.   ABDOMEN:  Soft, nontender.  Bowel sounds present and normal.  No audible abdominal bruit.     EXTREMITIES:  No increased edema present.  DP pulses palpable and equal bilaterally.      FEET:  Without lesions.      Assessment & Plan:  INCREASED STRESS/ANXIETY.  On lexapro.  Doing better.   Sodium just checked and normal.    ANEMIA.  Follow cbc and ferritin.  Last check wnl.   ABNORMAL MAMMOGRAM.  Referred to Dr Bary Castilla.  S/p biopsy.  Everything checked out fine.  Per review of Dr Dwyane Luo note, states was due 6 month f/u mammo in 6 months.  Had mammogram 09/17/13 [facility].       HEALTH MAINTENANCE.  Physical 10/11/13.  She is s/p hysterectomy.   Mammogram 09/23/11 [facility] II.  Most recent mammo - [facility] 4.  Referred to Dr Bary Castilla.  S/p biopsy.  Everything checked out fine.  See above.   Last mammogram 09/17/13 [facility] I.  Last colonoscopy 10/14/12 revealed internal hemorrhoids otherwise normal.  Recommended follow up colonoscopy in 10 years.   I spent 25 minutes with the patient and more than 50% of the time was spent in consultation regarding the above.

## 2014-04-06 ENCOUNTER — Ambulatory Visit (INDEPENDENT_AMBULATORY_CARE_PROVIDER_SITE_OTHER): Payer: Commercial Managed Care - HMO | Admitting: *Deleted

## 2014-04-06 DIAGNOSIS — Z23 Encounter for immunization: Secondary | ICD-10-CM

## 2014-04-21 ENCOUNTER — Other Ambulatory Visit: Payer: Commercial Managed Care - HMO

## 2014-04-22 ENCOUNTER — Other Ambulatory Visit (INDEPENDENT_AMBULATORY_CARE_PROVIDER_SITE_OTHER): Payer: Commercial Managed Care - HMO

## 2014-04-22 DIAGNOSIS — D649 Anemia, unspecified: Secondary | ICD-10-CM | POA: Diagnosis not present

## 2014-04-22 LAB — CBC WITH DIFFERENTIAL/PLATELET
Basophils Absolute: 0.2 10*3/uL — ABNORMAL HIGH (ref 0.0–0.1)
Basophils Relative: 1.8 % (ref 0.0–3.0)
Eosinophils Absolute: 1 10*3/uL — ABNORMAL HIGH (ref 0.0–0.7)
Eosinophils Relative: 10.9 % — ABNORMAL HIGH (ref 0.0–5.0)
HCT: 36.9 % (ref 36.0–46.0)
Hemoglobin: 11.5 g/dL — ABNORMAL LOW (ref 12.0–15.0)
LYMPHS PCT: 26.9 % (ref 12.0–46.0)
Lymphs Abs: 2.4 10*3/uL (ref 0.7–4.0)
MCHC: 31.2 g/dL (ref 30.0–36.0)
MCV: 79 fl (ref 78.0–100.0)
Monocytes Absolute: 0.9 10*3/uL (ref 0.1–1.0)
Monocytes Relative: 9.8 % (ref 3.0–12.0)
NEUTROS PCT: 50.6 % (ref 43.0–77.0)
Neutro Abs: 4.5 10*3/uL (ref 1.4–7.7)
Platelets: 381 10*3/uL (ref 150.0–400.0)
RBC: 4.67 Mil/uL (ref 3.87–5.11)
RDW: 18.1 % — ABNORMAL HIGH (ref 11.5–15.5)
WBC: 9 10*3/uL (ref 4.0–10.5)

## 2014-04-22 LAB — FERRITIN: Ferritin: 9.3 ng/mL — ABNORMAL LOW (ref 10.0–291.0)

## 2014-04-22 LAB — IBC PANEL
IRON: 39 ug/dL — AB (ref 42–145)
Saturation Ratios: 7.4 % — ABNORMAL LOW (ref 20.0–50.0)
Transferrin: 374 mg/dL — ABNORMAL HIGH (ref 212.0–360.0)

## 2014-04-22 LAB — VITAMIN B12

## 2014-04-23 ENCOUNTER — Encounter: Payer: Self-pay | Admitting: *Deleted

## 2014-04-23 ENCOUNTER — Other Ambulatory Visit: Payer: Self-pay | Admitting: Internal Medicine

## 2014-04-23 DIAGNOSIS — D509 Iron deficiency anemia, unspecified: Secondary | ICD-10-CM

## 2014-04-23 NOTE — Progress Notes (Signed)
Orders placed for f/u labs.  

## 2014-05-06 ENCOUNTER — Other Ambulatory Visit: Payer: Self-pay | Admitting: Internal Medicine

## 2014-05-10 ENCOUNTER — Encounter: Payer: Self-pay | Admitting: Internal Medicine

## 2014-05-25 ENCOUNTER — Other Ambulatory Visit (INDEPENDENT_AMBULATORY_CARE_PROVIDER_SITE_OTHER): Payer: Commercial Managed Care - HMO

## 2014-05-25 DIAGNOSIS — D509 Iron deficiency anemia, unspecified: Secondary | ICD-10-CM

## 2014-05-25 LAB — CBC WITH DIFFERENTIAL/PLATELET
Basophils Absolute: 0.1 10*3/uL (ref 0.0–0.1)
Basophils Relative: 1.3 % (ref 0.0–3.0)
EOS ABS: 1 10*3/uL — AB (ref 0.0–0.7)
EOS PCT: 9.2 % — AB (ref 0.0–5.0)
HCT: 37.8 % (ref 36.0–46.0)
Hemoglobin: 12 g/dL (ref 12.0–15.0)
Lymphocytes Relative: 31 % (ref 12.0–46.0)
Lymphs Abs: 3.3 10*3/uL (ref 0.7–4.0)
MCHC: 31.6 g/dL (ref 30.0–36.0)
MCV: 78.4 fl (ref 78.0–100.0)
MONO ABS: 0.9 10*3/uL (ref 0.1–1.0)
Monocytes Relative: 8.6 % (ref 3.0–12.0)
NEUTROS PCT: 49.9 % (ref 43.0–77.0)
Neutro Abs: 5.2 10*3/uL (ref 1.4–7.7)
Platelets: 423 10*3/uL — ABNORMAL HIGH (ref 150.0–400.0)
RBC: 4.83 Mil/uL (ref 3.87–5.11)
RDW: 17.6 % — ABNORMAL HIGH (ref 11.5–15.5)
WBC: 10.5 10*3/uL (ref 4.0–10.5)

## 2014-05-25 LAB — FERRITIN: FERRITIN: 8.7 ng/mL — AB (ref 10.0–291.0)

## 2014-05-26 ENCOUNTER — Other Ambulatory Visit: Payer: Self-pay | Admitting: Internal Medicine

## 2014-05-26 DIAGNOSIS — D75839 Thrombocytosis, unspecified: Secondary | ICD-10-CM

## 2014-05-26 DIAGNOSIS — D473 Essential (hemorrhagic) thrombocythemia: Secondary | ICD-10-CM

## 2014-05-26 NOTE — Progress Notes (Signed)
Order placed for f/u platelet count.  

## 2014-06-07 ENCOUNTER — Other Ambulatory Visit: Payer: Commercial Managed Care - HMO

## 2014-06-08 ENCOUNTER — Encounter (INDEPENDENT_AMBULATORY_CARE_PROVIDER_SITE_OTHER): Payer: Self-pay

## 2014-06-08 ENCOUNTER — Other Ambulatory Visit (INDEPENDENT_AMBULATORY_CARE_PROVIDER_SITE_OTHER): Payer: Commercial Managed Care - HMO

## 2014-06-08 DIAGNOSIS — D473 Essential (hemorrhagic) thrombocythemia: Secondary | ICD-10-CM

## 2014-06-08 DIAGNOSIS — D75839 Thrombocytosis, unspecified: Secondary | ICD-10-CM

## 2014-06-08 NOTE — Addendum Note (Signed)
Addended by: Johnsie Cancel on: 06/08/2014 11:46 AM   Modules accepted: Orders

## 2014-06-09 ENCOUNTER — Encounter: Payer: Self-pay | Admitting: *Deleted

## 2014-06-09 LAB — PLATELET COUNT: PLATELETS: 436 10*3/uL — AB (ref 150–400)

## 2014-06-30 ENCOUNTER — Ambulatory Visit (INDEPENDENT_AMBULATORY_CARE_PROVIDER_SITE_OTHER): Payer: Commercial Managed Care - HMO | Admitting: Internal Medicine

## 2014-06-30 ENCOUNTER — Encounter: Payer: Self-pay | Admitting: Internal Medicine

## 2014-06-30 VITALS — BP 110/70 | HR 64 | Temp 97.9°F | Ht 64.25 in | Wt 145.0 lb

## 2014-06-30 DIAGNOSIS — E039 Hypothyroidism, unspecified: Secondary | ICD-10-CM

## 2014-06-30 DIAGNOSIS — E7800 Pure hypercholesterolemia, unspecified: Secondary | ICD-10-CM

## 2014-06-30 DIAGNOSIS — D75839 Thrombocytosis, unspecified: Secondary | ICD-10-CM

## 2014-06-30 DIAGNOSIS — E119 Type 2 diabetes mellitus without complications: Secondary | ICD-10-CM

## 2014-06-30 DIAGNOSIS — K219 Gastro-esophageal reflux disease without esophagitis: Secondary | ICD-10-CM

## 2014-06-30 DIAGNOSIS — D473 Essential (hemorrhagic) thrombocythemia: Secondary | ICD-10-CM

## 2014-06-30 DIAGNOSIS — D649 Anemia, unspecified: Secondary | ICD-10-CM

## 2014-06-30 DIAGNOSIS — I10 Essential (primary) hypertension: Secondary | ICD-10-CM

## 2014-06-30 DIAGNOSIS — C50919 Malignant neoplasm of unspecified site of unspecified female breast: Secondary | ICD-10-CM

## 2014-06-30 NOTE — Progress Notes (Signed)
Pre visit review using our clinic review tool, if applicable. No additional management support is needed unless otherwise documented below in the visit note. 

## 2014-06-30 NOTE — Progress Notes (Signed)
Subjective:    Patient ID: Michelle Hawkins, female    DOB: Feb 12, 1946, 68 y.o.   MRN: [id number]  HPI 68 year old female with past history of breast cancer (s/p chemotherapy/XRT), hypertension, diabetes and hypercholesterolemia who comes in today for a scheduled follow up.  Sugars attached.  Appears to be doing better.  A1c last checked 03/29/14 - 7.5.  We discussed diet and exercise.   She has done well with Invokana.  No fever.   No chest pain or tightness.  No increased acid reflux.   No chest congestion. No sob.  No vomiting.  No dysphagia.  No bowel change.  We discussed diet and exercise.    Past Medical History  Diagnosis Date  . Breast cancer 1996    s/p lumpectomy (lymph node dissection - 2/11 positive), chemotherapy  . Diabetes mellitus   . H/O ulcer disease     PUD  . GERD (gastroesophageal reflux disease)   . Hypercholesterolemia   . Nephrolithiasis   . Hypothyroidism     multinodular goiter  . Scoliosis   . Dysphagia   . SVT (supraventricular tachycardia)   . Allergic rhinitis     Current Outpatient Prescriptions on File Prior to Visit  Medication Sig Dispense Refill  . ACCU-CHEK SOFTCLIX LANCETS lancets Check blood sugar twice daily Dx 250.00 200 each 1  . aspirin 81 MG tablet Take 81 mg by mouth 2 (two) times daily.     Marland Kitchen atorvastatin (LIPITOR) 20 MG tablet Take 1 tablet (20 mg total) by mouth daily. 90 tablet 0  . Biotin (BIOTIN 5000) 5 MG CAPS Take by mouth.    . Blood Glucose Monitoring Suppl (ONE TOUCH ULTRA SYSTEM KIT) W/DEVICE KIT 1 kit by Does not apply route once. 1 each 0  . calcium carbonate 200 MG capsule Take 250 mg by mouth 2 (two) times daily with a meal.    . cholecalciferol (VITAMIN D) 400 UNITS TABS Take by mouth.    . Coenzyme Q10 (COQ10 PO) Take by mouth.    . escitalopram (LEXAPRO) 20 MG tablet Take 1 tablet (20 mg total) by mouth daily. 90 tablet 1  . fish oil-omega-3 fatty acids 1000 MG capsule Take 2 g by mouth daily.    Marland Kitchen glucose blood  (ACCU-CHEK AVIVA PLUS) test strip Check blood sugar twice daily Dx 250.00 200 each 1  . INVOKANA 100 MG TABS tablet TAKE 1 TABLET EVERY DAY 30 tablet 1  . levothyroxine (SYNTHROID, LEVOTHROID) 25 MCG tablet Take 1 tablet (25 mcg total) by mouth daily. 90 tablet 0  . losartan (COZAAR) 100 MG tablet Take 1 tablet (100 mg total) by mouth daily. 90 tablet 0  . metFORMIN (GLUCOPHAGE) 500 MG tablet Take two tablets bid 360 tablet 3  . metoprolol succinate (TOPROL XL) 50 MG 24 hr tablet Take 1 tablet (50 mg total) by mouth daily. Take with or immediately following a meal. 90 tablet 3  . Multiple Vitamin (MULTIVITAMIN WITH MINERALS) TABS tablet Take 1 tablet by mouth daily.    . mupirocin ointment (BACTROBAN) 2 %     . nystatin cream (MYCOSTATIN) Apply 1 application topically 2 (two) times daily. 30 g 0  . omeprazole (PRILOSEC) 20 MG capsule Take 1 capsule (20 mg total) by mouth daily. 90 capsule 1  . TURMERIC PO Take by mouth daily.      No current facility-administered medications on file prior to visit.    Review of Systems Patient denies any headache, lightheadedness  or dizziness.   No sinus or nasal congestion.  No chest pain, tightness or palpitations.  No increased shortness of breath.  No nausea or vomiting.  No increased acid reflux reported.   Cough is better.  No abdominal pain or cramping.  No bowel change, such as diarrhea, constipation, BRBPR or melana.  No urine change.   Doing well on lexapro.  Sugar readings attached.          Objective:   Physical Exam  Filed Vitals:   06/30/14 1038  BP: 110/70  Pulse: 64  Temp: 97.9 F (36.6 C)   Blood pressure recheck: 46/50  68 year old female in no acute distress.   HEENT:  Nares- clear.  Oropharynx - without lesions. NECK:  Supple.  Nontender.  No audible bruit.  HEART:  Appears to be regular. LUNGS:  No crackles or wheezing audible.  Respirations even and unlabored.  RADIAL PULSE:  Equal bilaterally.   ABDOMEN:  Soft, nontender.   Bowel sounds present and normal.  No audible abdominal bruit.     EXTREMITIES:  No increased edema present.  DP pulses palpable and equal bilaterally.      FEET:  Without lesions.      Assessment & Plan:  1. Essential hypertension Blood pressure doing well.  Follow met b.  Same medication regimen.   2. Gastroesophageal reflux disease without esophagitis Controlled on prilosec.    3. Type 2 diabetes mellitus without complication Sugars attached.  Discussed diet and exercise.  Follow met b and a1c.   Lab Results  Component Value Date   HGBA1C 7.5* 03/29/2014   4. Hypothyroidism, unspecified hypothyroidism type On levothyroxine.  Follow tsh.    5. Thrombocytosis Platelet count slightly elevated.  Follow.    6. Hypercholesterolemia Low cholesterol diet and exercise.  On lipitor.  Follow lipid panel and liver function tests.   Lab Results  Component Value Date   CHOL 172 03/29/2014   HDL 57.20 03/29/2014   LDLCALC 97 03/29/2014   TRIG 90.0 03/29/2014   CHOLHDL 3 03/29/2014   7. Breast cancer, unspecified laterality Mammogram 09/17/13 - [facility] I.   8. Anemia, unspecified anemia type Last hgb wnl.    9. INCREASED STRESS/ANXIETY.  On lexapro.  Doing better.   Sodium last checked and normal.  (03/29/14 - 136).   10. ANEMIA.  Follow cbc and ferritin.  Last check wnl.  hgb 12.0.   11. ABNORMAL MAMMOGRAM.  Referred to Dr Bary Castilla.  S/p biopsy.  Everything checked out fine.  Per review of Dr Dwyane Luo note, states was due 6 month f/u mammo in 6 months.  Had mammogram 09/17/13 [facility] I.       HEALTH MAINTENANCE.  Physical 10/11/13.  She is s/p hysterectomy.   Mammogram 09/23/11 [facility] II.  Most recent mammo - [facility] 4.  Referred to Dr Bary Castilla.  S/p biopsy.  Everything checked out fine.  See above.   Last mammogram 09/17/13 [facility] I.  Last colonoscopy 10/14/12 revealed internal hemorrhoids otherwise normal.  Recommended follow up colonoscopy in 10 years.

## 2014-07-05 ENCOUNTER — Encounter: Payer: Self-pay | Admitting: Internal Medicine

## 2014-07-13 ENCOUNTER — Other Ambulatory Visit: Payer: Self-pay | Admitting: Internal Medicine

## 2014-07-13 ENCOUNTER — Other Ambulatory Visit: Payer: Self-pay | Admitting: *Deleted

## 2014-07-13 MED ORDER — LEVOTHYROXINE SODIUM 25 MCG PO TABS: 25.0000 ug | ORAL_TABLET | Freq: Every day | ORAL | Status: DC

## 2014-07-13 MED ORDER — CANAGLIFLOZIN 100 MG PO TABS: 100.0000 mg | ORAL_TABLET | Freq: Every day | ORAL | Status: DC

## 2014-07-13 MED ORDER — OMEPRAZOLE 20 MG PO CPDR: 20.0000 mg | DELAYED_RELEASE_CAPSULE | Freq: Every day | ORAL | Status: DC

## 2014-08-20 ENCOUNTER — Telehealth: Payer: Self-pay | Admitting: Internal Medicine

## 2014-08-20 NOTE — Telephone Encounter (Signed)
The patient has been scheduled and is aware of her appointment at [facility] on 3.14.16 @ 2:00.

## 2014-09-10 ENCOUNTER — Encounter: Payer: Commercial Managed Care - HMO | Admitting: Internal Medicine

## 2014-09-20 ENCOUNTER — Ambulatory Visit: Payer: Self-pay | Admitting: Internal Medicine

## 2014-09-20 LAB — HM MAMMOGRAPHY: HM Mammogram: NEGATIVE

## 2014-09-21 ENCOUNTER — Encounter: Payer: Self-pay | Admitting: *Deleted

## 2014-09-28 ENCOUNTER — Other Ambulatory Visit: Payer: Self-pay | Admitting: *Deleted

## 2014-09-28 ENCOUNTER — Telehealth: Payer: Self-pay | Admitting: *Deleted

## 2014-09-28 MED ORDER — GLUCOSE BLOOD VI STRP: ORAL_STRIP | Status: DC

## 2014-09-28 NOTE — Telephone Encounter (Signed)
Pt came into the clinic requesting the status of a referral to Dr Kellie Moor at [facility].  Please advise

## 2014-09-28 NOTE — Telephone Encounter (Signed)
Spoke with pt, she states she no longer needs the referral.

## 2014-09-28 NOTE — Telephone Encounter (Signed)
Left message for pt to return call.

## 2014-09-28 NOTE — Telephone Encounter (Signed)
Please clarify - reason for appt - as we discussed.  Thanks

## 2014-10-11 ENCOUNTER — Other Ambulatory Visit (INDEPENDENT_AMBULATORY_CARE_PROVIDER_SITE_OTHER): Payer: Commercial Managed Care - HMO

## 2014-10-11 DIAGNOSIS — E7800 Pure hypercholesterolemia, unspecified: Secondary | ICD-10-CM

## 2014-10-11 DIAGNOSIS — D649 Anemia, unspecified: Secondary | ICD-10-CM | POA: Diagnosis not present

## 2014-10-11 DIAGNOSIS — E119 Type 2 diabetes mellitus without complications: Secondary | ICD-10-CM

## 2014-10-11 LAB — LIPID PANEL
CHOLESTEROL: 168 mg/dL (ref 0–200)
HDL: 53.8 mg/dL (ref >39.00)
LDL Cholesterol: 93 mg/dL (ref 0–99)
NONHDL: 114.2
Total CHOL/HDL Ratio: 3
Triglycerides: 104 mg/dL (ref 0.0–149.0)
VLDL: 20.8 mg/dL (ref 0.0–40.0)

## 2014-10-11 LAB — CBC WITH DIFFERENTIAL/PLATELET
BASOS ABS: 0.1 10*3/uL (ref 0.0–0.1)
Basophils Relative: 0.6 % (ref 0.0–3.0)
EOS PCT: 9.5 % — AB (ref 0.0–5.0)
Eosinophils Absolute: 1 10*3/uL — ABNORMAL HIGH (ref 0.0–0.7)
HCT: 42.4 % (ref 36.0–46.0)
HEMOGLOBIN: 14.2 g/dL (ref 12.0–15.0)
LYMPHS ABS: 3 10*3/uL (ref 0.7–4.0)
Lymphocytes Relative: 27.6 % (ref 12.0–46.0)
MCHC: 33.5 g/dL (ref 30.0–36.0)
MCV: 87 fl (ref 78.0–100.0)
MONOS PCT: 9.3 % (ref 3.0–12.0)
Monocytes Absolute: 1 10*3/uL (ref 0.1–1.0)
NEUTROS ABS: 5.7 10*3/uL (ref 1.4–7.7)
Neutrophils Relative %: 53 % (ref 43.0–77.0)
Platelets: 345 10*3/uL (ref 150.0–400.0)
RBC: 4.88 Mil/uL (ref 3.87–5.11)
RDW: 15.4 % (ref 11.5–15.5)
WBC: 10.8 10*3/uL — ABNORMAL HIGH (ref 4.0–10.5)

## 2014-10-11 LAB — BASIC METABOLIC PANEL
BUN: 15 mg/dL (ref 6–23)
CO2: 29 mEq/L (ref 19–32)
Calcium: 9.9 mg/dL (ref 8.4–10.5)
Chloride: 97 mEq/L (ref 96–112)
Creatinine, Ser: 0.72 mg/dL (ref 0.40–1.20)
GFR: 85.23 mL/min (ref >60.00)
Glucose, Bld: 118 mg/dL — ABNORMAL HIGH (ref 70–99)
Potassium: 4.9 mEq/L (ref 3.5–5.1)
SODIUM: 131 meq/L — AB (ref 135–145)

## 2014-10-11 LAB — HEPATIC FUNCTION PANEL
ALBUMIN: 4.1 g/dL (ref 3.5–5.2)
ALT: 30 U/L (ref 0–35)
AST: 26 U/L (ref 0–37)
Alkaline Phosphatase: 65 U/L (ref 39–117)
BILIRUBIN DIRECT: 0.1 mg/dL (ref 0.0–0.3)
BILIRUBIN TOTAL: 0.4 mg/dL (ref 0.2–1.2)
TOTAL PROTEIN: 7.3 g/dL (ref 6.0–8.3)

## 2014-10-11 LAB — MICROALBUMIN / CREATININE URINE RATIO
Creatinine,U: 89.1 mg/dL
MICROALB UR: 1 mg/dL (ref 0.0–1.9)
MICROALB/CREAT RATIO: 1.1 mg/g (ref 0.0–30.0)

## 2014-10-11 LAB — HEMOGLOBIN A1C: HEMOGLOBIN A1C: 7.4 % — AB (ref 4.6–6.5)

## 2014-10-14 ENCOUNTER — Ambulatory Visit (INDEPENDENT_AMBULATORY_CARE_PROVIDER_SITE_OTHER): Payer: Commercial Managed Care - HMO | Admitting: Internal Medicine

## 2014-10-14 ENCOUNTER — Encounter: Payer: Self-pay | Admitting: Internal Medicine

## 2014-10-14 VITALS — BP 110/60 | HR 60 | Temp 98.7°F | Resp 14 | Ht 64.0 in | Wt 146.6 lb

## 2014-10-14 DIAGNOSIS — E871 Hypo-osmolality and hyponatremia: Secondary | ICD-10-CM | POA: Diagnosis not present

## 2014-10-14 DIAGNOSIS — E119 Type 2 diabetes mellitus without complications: Secondary | ICD-10-CM | POA: Diagnosis not present

## 2014-10-14 DIAGNOSIS — C50919 Malignant neoplasm of unspecified site of unspecified female breast: Secondary | ICD-10-CM

## 2014-10-14 DIAGNOSIS — E7800 Pure hypercholesterolemia, unspecified: Secondary | ICD-10-CM

## 2014-10-14 DIAGNOSIS — E039 Hypothyroidism, unspecified: Secondary | ICD-10-CM

## 2014-10-14 DIAGNOSIS — I10 Essential (primary) hypertension: Secondary | ICD-10-CM | POA: Diagnosis not present

## 2014-10-14 DIAGNOSIS — D75839 Thrombocytosis, unspecified: Secondary | ICD-10-CM

## 2014-10-14 DIAGNOSIS — D649 Anemia, unspecified: Secondary | ICD-10-CM

## 2014-10-14 DIAGNOSIS — N898 Other specified noninflammatory disorders of vagina: Secondary | ICD-10-CM

## 2014-10-14 DIAGNOSIS — K219 Gastro-esophageal reflux disease without esophagitis: Secondary | ICD-10-CM

## 2014-10-14 DIAGNOSIS — Z0000 Encounter for general adult medical examination without abnormal findings: Secondary | ICD-10-CM

## 2014-10-14 DIAGNOSIS — D473 Essential (hemorrhagic) thrombocythemia: Secondary | ICD-10-CM

## 2014-10-14 LAB — SODIUM: SODIUM: 135 meq/L (ref 135–145)

## 2014-10-14 NOTE — Progress Notes (Signed)
Pre visit review using our clinic review tool, if applicable. No additional management support is needed unless otherwise documented below in the visit note. 

## 2014-10-14 NOTE — Progress Notes (Signed)
Patient ID: Michelle Hawkins, female   DOB: Feb 25, 1946, 69 y.o.   MRN: [id number]   Subjective:    Patient ID: Michelle Hawkins, female    DOB: 1946-05-25, 69 y.o.   MRN: [id number]  HPI  Patient here for her physical exam.  States she is doing relatively well.  Recent sodium decreased.  Will recheck today.  Tries to stay active.  No cardiac symptoms with increased activity or exertion.  Breathing stable.  She has tried to adjust her diet.  Cutting out sugar.  Reviewed blood sugar readings.  AM sugars averaging 95-120 and pm sugars 90-115.  Due to get eyes checked 6-01/2015. Reports some intermittent peri vaginal irritation.    Past Medical History  Diagnosis Date  . Breast cancer 1996    s/p lumpectomy (lymph node dissection - 2/11 positive), chemotherapy  . Diabetes mellitus   . H/O ulcer disease     PUD  . GERD (gastroesophageal reflux disease)   . Hypercholesterolemia   . Nephrolithiasis   . Hypothyroidism     multinodular goiter  . Scoliosis   . Dysphagia   . SVT (supraventricular tachycardia)   . Allergic rhinitis     Current Outpatient Prescriptions on File Prior to Visit  Medication Sig Dispense Refill  . ACCU-CHEK SOFTCLIX LANCETS lancets Check blood sugar twice daily Dx 250.00 200 each 1  . aspirin 81 MG tablet Take 81 mg by mouth 2 (two) times daily.     Marland Kitchen atorvastatin (LIPITOR) 20 MG tablet Take 1 tablet (20 mg total) by mouth daily. 90 tablet 0  . Biotin (BIOTIN 5000) 5 MG CAPS Take by mouth.    . Blood Glucose Monitoring Suppl (ONE TOUCH ULTRA SYSTEM KIT) W/DEVICE KIT 1 kit by Does not apply route once. 1 each 0  . calcium carbonate 200 MG capsule Take 250 mg by mouth 2 (two) times daily with a meal.    . canagliflozin (INVOKANA) 100 MG TABS tablet Take 1 tablet (100 mg total) by mouth daily. 30 tablet 5  . cholecalciferol (VITAMIN D) 400 UNITS TABS Take by mouth.    . Coenzyme Q10 (COQ10 PO) Take by mouth.    . escitalopram (LEXAPRO) 20 MG tablet Take 1 tablet (20 mg total)  by mouth daily. 90 tablet 1  . fish oil-omega-3 fatty acids 1000 MG capsule Take 2 g by mouth daily.    Marland Kitchen glucose blood (ACCU-CHEK AVIVA PLUS) test strip Check blood sugar twice daily Dx E11.9 200 each 1  . IRON PO Take by mouth.    . levothyroxine (SYNTHROID, LEVOTHROID) 25 MCG tablet Take 1 tablet (25 mcg total) by mouth daily. 90 tablet 2  . losartan (COZAAR) 100 MG tablet Take 1 tablet (100 mg total) by mouth daily. 90 tablet 0  . metFORMIN (GLUCOPHAGE) 500 MG tablet Take two tablets bid 360 tablet 3  . metoprolol succinate (TOPROL XL) 50 MG 24 hr tablet Take 1 tablet (50 mg total) by mouth daily. Take with or immediately following a meal. 90 tablet 3  . Multiple Vitamin (MULTIVITAMIN WITH MINERALS) TABS tablet Take 1 tablet by mouth daily.    Marland Kitchen omeprazole (PRILOSEC) 20 MG capsule TAKE 1 CAPSULE EVERY DAY 90 capsule 1  . TURMERIC PO Take by mouth daily.     Marland Kitchen nystatin cream (MYCOSTATIN) Apply 1 application topically 2 (two) times daily. (Patient not taking: Reported on 10/14/2014) 30 g 0   No current facility-administered medications on file prior to visit.  Review of Systems  Constitutional: Negative for appetite change and unexpected weight change.  HENT: Negative for congestion and sinus pressure.   Eyes: Negative for pain and visual disturbance.  Respiratory: Negative for cough, chest tightness and shortness of breath.   Cardiovascular: Negative for chest pain, palpitations and leg swelling.  Gastrointestinal: Negative for nausea, vomiting, abdominal pain and diarrhea.  Genitourinary: Negative for dysuria and difficulty urinating.  Musculoskeletal: Negative for back pain and joint swelling.  Skin: Negative for color change and rash.  Neurological: Negative for dizziness, light-headedness and headaches.  Hematological: Negative for adenopathy. Does not bruise/bleed easily.  Psychiatric/Behavioral: Negative for dysphoric mood and agitation.       Objective:    Physical Exam    Constitutional: She is oriented to person, place, and time. She appears well-developed and well-nourished.  HENT:  Nose: Nose normal.  Mouth/Throat: Oropharynx is clear and moist.  Eyes: Right eye exhibits no discharge. Left eye exhibits no discharge. No scleral icterus.  Neck: Neck supple. No thyromegaly present.  Cardiovascular: Normal rate and regular rhythm.   Pulmonary/Chest: Breath sounds normal. No accessory muscle usage. No tachypnea. No respiratory distress. She has no decreased breath sounds. She has no wheezes. She has no rhonchi. Right breast exhibits no inverted nipple, no mass, no nipple discharge and no tenderness (no axillary adenopathy). Left breast exhibits no inverted nipple, no mass, no nipple discharge and no tenderness (no axilarry adenopathy).  Abdominal: Soft. Bowel sounds are normal. There is no tenderness.  Genitourinary:  Perivaginal erythema (minimal).  No lesions.   Musculoskeletal: She exhibits no edema or tenderness.  Lymphadenopathy:    She has no cervical adenopathy.  Neurological: She is alert and oriented to person, place, and time.  Skin: Skin is warm. No rash noted.  Psychiatric: She has a normal mood and affect. Her behavior is normal.    BP 110/60 mmHg  Pulse 60  Temp(Src) 98.7 F (37.1 C) (Oral)  Resp 14  Ht '5\' 4"'  (1.626 m)  Wt 146 lb 9.6 oz (66.497 kg)  BMI 25.15 kg/m2  SpO2 95% Wt Readings from Last 3 Encounters:  10/14/14 146 lb 9.6 oz (66.497 kg)  06/30/14 145 lb (65.772 kg)  03/31/14 145 lb 4 oz (65.885 kg)     Lab Results  Component Value Date   WBC 10.8* 10/11/2014   HGB 14.2 10/11/2014   HCT 42.4 10/11/2014   PLT 345.0 10/11/2014   GLUCOSE 118* 10/11/2014   CHOL 168 10/11/2014   TRIG 104.0 10/11/2014   HDL 53.80 10/11/2014   LDLCALC 93 10/11/2014   ALT 30 10/11/2014   AST 26 10/11/2014   NA 135 10/14/2014   K 4.9 10/11/2014   CL 97 10/11/2014   CREATININE 0.72 10/11/2014   BUN 15 10/11/2014   CO2 29 10/11/2014    TSH 1.37 03/29/2014   HGBA1C 7.4* 10/11/2014   MICROALBUR 1.0 10/11/2014       Assessment & Plan:   Problem List Items Addressed This Visit    Anemia    hgb just checked and wnl.  Will decrease iron to qod.  Recheck cbc and ferritin in 2 months.       Relevant Orders   CBC with Differential/Platelet   Ferritin   Breast cancer    Mammogram 09/20/14 - Birads I.       Diabetes mellitus    Sugars as outlined.  a1c slightly decreased.  Discussed changing medication - given concern regarding vaginal yeast infection.  She wants  to hold on changing.  Follow sugars.   Continue diet adjustment and exercise.  Follow.        GERD (gastroesophageal reflux disease)    Symptoms controlled on omeprazole.  Follow.       Health care maintenance    Physical 10/14/14.  Mammogram 09/20/14 - Birads I.  Colonoscopy 10/14/12 - normal.  Recommended f/u in 10 years.        Hypercholesterolemia    Low cholesterol diet and exercise.  Follow lipid panel and liver function tests.  On lipitor.       Hypertension    Blood pressure doing well.  Same medication regimen.  Follow met b.       Hyponatremia - Primary    Recheck sodium today.       Relevant Orders   Sodium (Completed)   Hypothyroidism    On replacement.  Follow tsh.       Thrombocytosis    10/11/14 - platelet count wnl.       Vaginal irritation    Nystatin cream as directed.  Use regularly on a schedule.          I spent 25 minutes with the patient and more than 50% of the time was spent in consultation regarding the above.     Einar Pheasant, MD

## 2014-10-18 DIAGNOSIS — Z0000 Encounter for general adult medical examination without abnormal findings: Secondary | ICD-10-CM | POA: Insufficient documentation

## 2014-10-18 NOTE — Assessment & Plan Note (Signed)
Low cholesterol diet and exercise.  Follow lipid panel and liver function tests.  On lipitor.   

## 2014-10-18 NOTE — Assessment & Plan Note (Signed)
Nystatin cream as directed.  Use regularly on a schedule.

## 2014-10-18 NOTE — Assessment & Plan Note (Addendum)
hgb just checked and wnl.  Will decrease iron to qod.  Recheck cbc and ferritin in 2 months.

## 2014-10-18 NOTE — Assessment & Plan Note (Signed)
Blood pressure doing well.  Same medication regimen.  Follow met b  

## 2014-10-18 NOTE — Assessment & Plan Note (Signed)
Physical 10/14/14.  Mammogram 09/20/14 - Birads I.  Colonoscopy 10/14/12 - normal.  Recommended f/u in 10 years.

## 2014-10-18 NOTE — Assessment & Plan Note (Signed)
Symptoms controlled on omeprazole.  Follow.

## 2014-10-18 NOTE — Assessment & Plan Note (Signed)
On replacement.  Follow tsh.  

## 2014-10-18 NOTE — Assessment & Plan Note (Signed)
Mammogram 09/20/14 - Birads I.

## 2014-10-18 NOTE — Assessment & Plan Note (Signed)
Sugars as outlined.  a1c slightly decreased.  Discussed changing medication - given concern regarding vaginal yeast infection.  She wants to hold on changing.  Follow sugars.   Continue diet adjustment and exercise.  Follow.

## 2014-10-18 NOTE — Assessment & Plan Note (Signed)
10/11/14 - platelet count wnl.

## 2014-10-18 NOTE — Assessment & Plan Note (Signed)
Recheck sodium today.  

## 2014-11-26 ENCOUNTER — Other Ambulatory Visit: Payer: Self-pay | Admitting: Internal Medicine

## 2015-01-19 ENCOUNTER — Encounter: Payer: Self-pay | Admitting: Internal Medicine

## 2015-01-19 ENCOUNTER — Ambulatory Visit (INDEPENDENT_AMBULATORY_CARE_PROVIDER_SITE_OTHER): Payer: Commercial Managed Care - HMO | Admitting: Internal Medicine

## 2015-01-19 VITALS — BP 126/73 | HR 54 | Temp 98.3°F | Ht 64.0 in | Wt 145.0 lb

## 2015-01-19 DIAGNOSIS — I10 Essential (primary) hypertension: Secondary | ICD-10-CM | POA: Diagnosis not present

## 2015-01-19 DIAGNOSIS — D649 Anemia, unspecified: Secondary | ICD-10-CM

## 2015-01-19 DIAGNOSIS — R05 Cough: Secondary | ICD-10-CM | POA: Diagnosis not present

## 2015-01-19 DIAGNOSIS — E7800 Pure hypercholesterolemia, unspecified: Secondary | ICD-10-CM

## 2015-01-19 DIAGNOSIS — E119 Type 2 diabetes mellitus without complications: Secondary | ICD-10-CM | POA: Diagnosis not present

## 2015-01-19 DIAGNOSIS — K219 Gastro-esophageal reflux disease without esophagitis: Secondary | ICD-10-CM

## 2015-01-19 DIAGNOSIS — R059 Cough, unspecified: Secondary | ICD-10-CM

## 2015-01-19 LAB — HM DIABETES EYE EXAM

## 2015-01-19 MED ORDER — OMEPRAZOLE 20 MG PO CPDR: 20.0000 mg | DELAYED_RELEASE_CAPSULE | Freq: Two times a day (BID) | ORAL | Status: DC

## 2015-01-19 MED ORDER — FLUTICASONE PROPIONATE HFA 110 MCG/ACT IN AERO: 2.0000 | INHALATION_SPRAY | Freq: Two times a day (BID) | RESPIRATORY_TRACT | Status: DC

## 2015-01-19 NOTE — Progress Notes (Signed)
Patient ID: Michelle Hawkins, female   DOB: 1946-06-10, 69 y.o.   MRN: [id number]   Subjective:    Patient ID: Michelle Hawkins, female    DOB: 26-Nov-1945, 69 y.o.   MRN: [id number]  HPI  Patient here as a work in with concerns regarding persistent cough.  On questioning her, she has had issues intermittently with increased cough and some congestion.  Has seen Dr Lake Bells previously.  Refer to his note for details.  States lately she has noticed worsening cough.  Some nasal congestion and sneezing.  No chest congestion.  Increased cough, occasionally minimally productive.  She has also noticed some increased acid reflux.  Some increased choking at times. No sob.  No fever.  Eating and drinking well.  Bowels stable.  No nausea or vomiting.     Past Medical History  Diagnosis Date  . Breast cancer 1996    s/p lumpectomy (lymph node dissection - 2/11 positive), chemotherapy  . Diabetes mellitus   . H/O ulcer disease     PUD  . GERD (gastroesophageal reflux disease)   . Hypercholesterolemia   . Nephrolithiasis   . Hypothyroidism     multinodular goiter  . Scoliosis   . Dysphagia   . SVT (supraventricular tachycardia)   . Allergic rhinitis     Current Outpatient Prescriptions on File Prior to Visit  Medication Sig Dispense Refill  . ACCU-CHEK SOFTCLIX LANCETS lancets Check blood sugar twice daily Dx 250.00 200 each 1  . aspirin 81 MG tablet Take 81 mg by mouth 2 (two) times daily.     Marland Kitchen atorvastatin (LIPITOR) 20 MG tablet Take 1 tablet (20 mg total) by mouth daily. 90 tablet 0  . Biotin (BIOTIN 5000) 5 MG CAPS Take by mouth.    . Blood Glucose Monitoring Suppl (ONE TOUCH ULTRA SYSTEM KIT) W/DEVICE KIT 1 kit by Does not apply route once. 1 each 0  . calcium carbonate 200 MG capsule Take 250 mg by mouth 2 (two) times daily with a meal.    . cholecalciferol (VITAMIN D) 400 UNITS TABS Take by mouth.    . Coenzyme Q10 (COQ10 PO) Take by mouth.    . escitalopram (LEXAPRO) 20 MG tablet Take 1 tablet  (20 mg total) by mouth daily. 90 tablet 1  . fish oil-omega-3 fatty acids 1000 MG capsule Take 2 g by mouth daily.    Marland Kitchen glucose blood (ACCU-CHEK AVIVA PLUS) test strip Check blood sugar twice daily Dx E11.9 200 each 1  . INVOKANA 100 MG TABS tablet TAKE 1 TABLET EVERY DAY 90 tablet 1  . IRON PO Take by mouth.    . levothyroxine (SYNTHROID, LEVOTHROID) 25 MCG tablet Take 1 tablet (25 mcg total) by mouth daily. 90 tablet 2  . losartan (COZAAR) 100 MG tablet Take 1 tablet (100 mg total) by mouth daily. 90 tablet 0  . metFORMIN (GLUCOPHAGE) 500 MG tablet Take two tablets bid 360 tablet 3  . metoprolol succinate (TOPROL XL) 50 MG 24 hr tablet Take 1 tablet (50 mg total) by mouth daily. Take with or immediately following a meal. 90 tablet 3  . Multiple Vitamin (MULTIVITAMIN WITH MINERALS) TABS tablet Take 1 tablet by mouth daily.    Marland Kitchen nystatin cream (MYCOSTATIN) Apply 1 application topically 2 (two) times daily. 30 g 0  . TURMERIC PO Take by mouth daily.      No current facility-administered medications on file prior to visit.    Review of Systems  Constitutional:  Negative for appetite change and unexpected weight change.  HENT: Positive for congestion (nasal congestion. ). Negative for sinus pressure and sore throat.   Respiratory: Positive for cough. Negative for chest tightness and shortness of breath.   Cardiovascular: Negative for chest pain, palpitations and leg swelling.  Gastrointestinal: Negative for nausea, vomiting, abdominal pain and diarrhea.  Musculoskeletal: Negative for joint swelling.  Skin: Negative for color change and rash.  Neurological: Negative for dizziness, light-headedness and headaches.  Psychiatric/Behavioral: Negative for dysphoric mood and agitation.       Objective:    Physical Exam  Constitutional: She appears well-developed and well-nourished. No distress.  HENT:  Nose: Nose normal.  Mouth/Throat: Oropharynx is clear and moist.  Neck: Neck supple.    Cardiovascular: Normal rate and regular rhythm.   Pulmonary/Chest: Breath sounds normal. No respiratory distress. She has no wheezes.  Abdominal: Soft. Bowel sounds are normal. There is no tenderness.  Musculoskeletal: She exhibits no edema or tenderness.  Lymphadenopathy:    She has no cervical adenopathy.  Skin: No rash noted. No erythema.  Psychiatric: She has a normal mood and affect. Her behavior is normal.    BP 126/73 mmHg  Pulse 54  Temp(Src) 98.3 F (36.8 C) (Oral)  Ht '5\' 4"'  (1.626 m)  Wt 145 lb (65.772 kg)  BMI 24.88 kg/m2  SpO2 97% Wt Readings from Last 3 Encounters:  01/19/15 145 lb (65.772 kg)  10/14/14 146 lb 9.6 oz (66.497 kg)  06/30/14 145 lb (65.772 kg)     Lab Results  Component Value Date   WBC 10.8* 10/11/2014   HGB 14.2 10/11/2014   HCT 42.4 10/11/2014   PLT 345.0 10/11/2014   GLUCOSE 118* 10/11/2014   CHOL 168 10/11/2014   TRIG 104.0 10/11/2014   HDL 53.80 10/11/2014   LDLCALC 93 10/11/2014   ALT 30 10/11/2014   AST 26 10/11/2014   NA 135 10/14/2014   K 4.9 10/11/2014   CL 97 10/11/2014   CREATININE 0.72 10/11/2014   BUN 15 10/11/2014   CO2 29 10/11/2014   TSH 1.37 03/29/2014   HGBA1C 7.4* 10/11/2014   MICROALBUR 1.0 10/11/2014       Assessment & Plan:   Problem List Items Addressed This Visit    Anemia   Relevant Orders   CBC with Differential/Platelet   Ferritin   Cough - Primary    Persistent cough (and worsening cough) as outlined.  Increased omeprzole to bid.  Saline nasal spray and nasacort nasal spray as outlined.  flovent inhaler as directed.  Follow.  Check cxr.        Relevant Orders   DG Chest 2 View   Diabetes mellitus    Brought in no recorded readings, but does report sugars under reasonable control.  Same medication regimen.  Follow met b and a1c.       Relevant Orders   Hemoglobin N3I   Basic metabolic panel   GERD (gastroesophageal reflux disease)    Some increased reflux as outlined.  Will increase  omeprazole to bid.  Probably contributing to the increased cough.  Given no history of EGD and given need for increased PPI, refer to GI for evaluation and question of need for EGD.        Relevant Medications   omeprazole (PRILOSEC) 20 MG capsule   Hypercholesterolemia   Relevant Orders   Lipid panel   Hepatic function panel   Hypertension    Blood pressure has been well controlled.  Same medication  regimen.  Follow pressures.  Follow metabolic panel.        Relevant Orders   TSH     I spent 25 minutes with the patient and more than 50% of the time was spent in consultation regarding the above.     Einar Pheasant, MD

## 2015-01-19 NOTE — Patient Instructions (Signed)
Saline nasal spray - flush nose at least 2-3x/day  nasacort nasal spray - 2 sprays each nostril one time per day.  Do this in the evening.    Flovent Inhaler 132mcg - two puffs twice a day.  Rinse mouth after use.

## 2015-01-19 NOTE — Progress Notes (Signed)
Pre visit review using our clinic review tool, if applicable. No additional management support is needed unless otherwise documented below in the visit note. 

## 2015-01-20 ENCOUNTER — Encounter: Payer: Self-pay | Admitting: Internal Medicine

## 2015-01-20 NOTE — Assessment & Plan Note (Signed)
Blood pressure has been well controlled.  Same medication regimen.  Follow pressures.  Follow metabolic panel.   

## 2015-01-20 NOTE — Assessment & Plan Note (Signed)
Persistent cough (and worsening cough) as outlined.  Increased omeprzole to bid.  Saline nasal spray and nasacort nasal spray as outlined.  flovent inhaler as directed.  Follow.  Check cxr.

## 2015-01-20 NOTE — Assessment & Plan Note (Signed)
Brought in no recorded readings, but does report sugars under reasonable control.  Same medication regimen.  Follow met b and a1c.

## 2015-01-20 NOTE — Assessment & Plan Note (Signed)
Some increased reflux as outlined.  Will increase omeprazole to bid.  Probably contributing to the increased cough.  Given no history of EGD and given need for increased PPI, refer to GI for evaluation and question of need for EGD.

## 2015-01-21 ENCOUNTER — Encounter: Payer: Self-pay | Admitting: Internal Medicine

## 2015-01-21 ENCOUNTER — Ambulatory Visit
Admission: RE | Admit: 2015-01-21 | Discharge: 2015-01-21 | Disposition: A | Discharge status: Home / Self Care | Payer: Commercial Managed Care - HMO | Source: Ambulatory Visit | Attending: Internal Medicine | Admitting: Internal Medicine

## 2015-01-21 DIAGNOSIS — J449 Chronic obstructive pulmonary disease, unspecified: Secondary | ICD-10-CM | POA: Diagnosis not present

## 2015-01-21 DIAGNOSIS — R05 Cough: Secondary | ICD-10-CM

## 2015-01-21 DIAGNOSIS — R059 Cough, unspecified: Secondary | ICD-10-CM

## 2015-01-26 ENCOUNTER — Other Ambulatory Visit: Payer: Self-pay | Admitting: *Deleted

## 2015-01-26 MED ORDER — OMEPRAZOLE 20 MG PO CPDR: 20.0000 mg | DELAYED_RELEASE_CAPSULE | Freq: Two times a day (BID) | ORAL | Status: DC

## 2015-01-31 ENCOUNTER — Other Ambulatory Visit: Payer: Self-pay | Admitting: Internal Medicine

## 2015-02-25 ENCOUNTER — Telehealth: Payer: Self-pay | Admitting: Internal Medicine

## 2015-02-25 NOTE — Telephone Encounter (Signed)
Please advise where to schedule? Thanks, [employer]

## 2015-02-25 NOTE — Telephone Encounter (Signed)
Pt will be out of town on 8/24-08/28. Pt needs to resch her 6week follow up appt no appt avail. Please let me know where to sch? Pt does not want to see another provider. I did not resch lab until the 6week appt is resch. Thank You!

## 2015-02-27 NOTE — Telephone Encounter (Signed)
I sent you a message to schedule another pt on 03/11/15 at 4:30.  Please see if that pt can come in Ms Hayse's spot and see if Ms Hartsfield can come in on 03/11/15 at 4:30.  Thanks   If any questions, just let me know.

## 2015-02-28 NOTE — Telephone Encounter (Signed)
Spoke with patient, she will come on the 2nd for lab work at [place] and then return at 77 to see Dr. Nicki Reaper.

## 2015-03-01 ENCOUNTER — Other Ambulatory Visit: Payer: Commercial Managed Care - HMO

## 2015-03-02 ENCOUNTER — Ambulatory Visit: Payer: Commercial Managed Care - HMO | Admitting: Internal Medicine

## 2015-03-11 ENCOUNTER — Ambulatory Visit (INDEPENDENT_AMBULATORY_CARE_PROVIDER_SITE_OTHER): Payer: Commercial Managed Care - HMO | Admitting: Internal Medicine

## 2015-03-11 ENCOUNTER — Other Ambulatory Visit (INDEPENDENT_AMBULATORY_CARE_PROVIDER_SITE_OTHER): Payer: Commercial Managed Care - HMO

## 2015-03-11 ENCOUNTER — Encounter: Payer: Self-pay | Admitting: Internal Medicine

## 2015-03-11 VITALS — BP 108/64 | HR 57 | Temp 98.2°F | Ht 64.0 in | Wt 145.8 lb

## 2015-03-11 DIAGNOSIS — I10 Essential (primary) hypertension: Secondary | ICD-10-CM | POA: Diagnosis not present

## 2015-03-11 DIAGNOSIS — E119 Type 2 diabetes mellitus without complications: Secondary | ICD-10-CM

## 2015-03-11 DIAGNOSIS — E039 Hypothyroidism, unspecified: Secondary | ICD-10-CM | POA: Diagnosis not present

## 2015-03-11 DIAGNOSIS — D75839 Thrombocytosis, unspecified: Secondary | ICD-10-CM

## 2015-03-11 DIAGNOSIS — E7800 Pure hypercholesterolemia, unspecified: Secondary | ICD-10-CM

## 2015-03-11 DIAGNOSIS — K219 Gastro-esophageal reflux disease without esophagitis: Secondary | ICD-10-CM

## 2015-03-11 DIAGNOSIS — D649 Anemia, unspecified: Secondary | ICD-10-CM

## 2015-03-11 DIAGNOSIS — R059 Cough, unspecified: Secondary | ICD-10-CM

## 2015-03-11 DIAGNOSIS — C50919 Malignant neoplasm of unspecified site of unspecified female breast: Secondary | ICD-10-CM

## 2015-03-11 DIAGNOSIS — D473 Essential (hemorrhagic) thrombocythemia: Secondary | ICD-10-CM

## 2015-03-11 DIAGNOSIS — R05 Cough: Secondary | ICD-10-CM

## 2015-03-11 LAB — BASIC METABOLIC PANEL
BUN: 16 mg/dL (ref 6–23)
CALCIUM: 9.3 mg/dL (ref 8.4–10.5)
CO2: 29 mEq/L (ref 19–32)
Chloride: 101 mEq/L (ref 96–112)
Creatinine, Ser: 0.73 mg/dL (ref 0.40–1.20)
GFR: 83.78 mL/min (ref >60.00)
Glucose, Bld: 118 mg/dL — ABNORMAL HIGH (ref 70–99)
Potassium: 4.4 mEq/L (ref 3.5–5.1)
Sodium: 138 mEq/L (ref 135–145)

## 2015-03-11 LAB — CBC WITH DIFFERENTIAL/PLATELET
BASOS PCT: 2 % (ref 0.0–3.0)
Basophils Absolute: 0.2 10*3/uL — ABNORMAL HIGH (ref 0.0–0.1)
EOS ABS: 0.9 10*3/uL — AB (ref 0.0–0.7)
Eosinophils Relative: 9.5 % — ABNORMAL HIGH (ref 0.0–5.0)
HEMATOCRIT: 43.6 % (ref 36.0–46.0)
HEMOGLOBIN: 14.3 g/dL (ref 12.0–15.0)
Lymphocytes Relative: 28.2 % (ref 12.0–46.0)
Lymphs Abs: 2.6 10*3/uL (ref 0.7–4.0)
MCHC: 32.7 g/dL (ref 30.0–36.0)
MCV: 91.9 fl (ref 78.0–100.0)
Monocytes Absolute: 0.9 10*3/uL (ref 0.1–1.0)
Monocytes Relative: 9.5 % (ref 3.0–12.0)
Neutro Abs: 4.7 10*3/uL (ref 1.4–7.7)
Neutrophils Relative %: 50.8 % (ref 43.0–77.0)
Platelets: 325 10*3/uL (ref 150.0–400.0)
RBC: 4.75 Mil/uL (ref 3.87–5.11)
RDW: 14.1 % (ref 11.5–15.5)
WBC: 9.2 10*3/uL (ref 4.0–10.5)

## 2015-03-11 LAB — HEPATIC FUNCTION PANEL
ALT: 24 U/L (ref 0–35)
AST: 23 U/L (ref 0–37)
Albumin: 4.1 g/dL (ref 3.5–5.2)
Alkaline Phosphatase: 58 U/L (ref 39–117)
BILIRUBIN TOTAL: 0.5 mg/dL (ref 0.2–1.2)
Bilirubin, Direct: 0.1 mg/dL (ref 0.0–0.3)
TOTAL PROTEIN: 7.3 g/dL (ref 6.0–8.3)

## 2015-03-11 LAB — FERRITIN: Ferritin: 31.1 ng/mL (ref 10.0–291.0)

## 2015-03-11 LAB — LIPID PANEL
CHOLESTEROL: 164 mg/dL (ref 0–200)
HDL: 53 mg/dL (ref >39.00)
LDL CALC: 92 mg/dL (ref 0–99)
NonHDL: 111.05
TRIGLYCERIDES: 93 mg/dL (ref 0.0–149.0)
Total CHOL/HDL Ratio: 3
VLDL: 18.6 mg/dL (ref 0.0–40.0)

## 2015-03-11 LAB — HEMOGLOBIN A1C: HEMOGLOBIN A1C: 7.2 % — AB (ref 4.6–6.5)

## 2015-03-11 LAB — TSH: TSH: 1.62 u[IU]/mL (ref 0.35–4.50)

## 2015-03-11 NOTE — Progress Notes (Signed)
Pre visit review using our clinic review tool, if applicable. No additional management support is needed unless otherwise documented below in the visit note. 

## 2015-03-11 NOTE — Progress Notes (Signed)
Patient ID: Michelle Hawkins, female   DOB: Oct 13, 1945, 69 y.o.   MRN: [id number]   Subjective:    Patient ID: Michelle Hawkins, female    DOB: [birth year], 70 y.o.   MRN: [id number]  HPI  Patient here for a scheduled follow up.  Her overall sugar control is better.  She has decided recently to get more serious about her diet.  Tries to stay active.  No cardiac symptoms with increased activity or exertion.  No sob.  No acid reflux.  Bowels stable.  Cough is better.  Overall feels better.  Stress and anxiety better.     Past Medical History  Diagnosis Date  . Breast cancer 1996    s/p lumpectomy (lymph node dissection - 2/11 positive), chemotherapy  . Diabetes mellitus   . H/O ulcer disease     PUD  . GERD (gastroesophageal reflux disease)   . Hypercholesterolemia   . Nephrolithiasis   . Hypothyroidism     multinodular goiter  . Scoliosis   . Dysphagia   . SVT (supraventricular tachycardia)   . Allergic rhinitis    Past Surgical History  Procedure Laterality Date  . Breast lumpectomy with axillary lymph node dissection  1996    left  . Abdominal hysterectomy      partial  . Lumbar laminectomy  3/07    L5-S1   Family History  Problem Relation Age of Onset  . Heart disease Father   . Hypertension Father   . Diabetes Father    Social History   Social History  . Marital Status: Married    Spouse Name: N/A  . Number of Children: N/A  . Years of Education: N/A   Social History Main Topics  . Smoking status: Former Smoker -- 0.50 packs/day for 40 years    Types: Cigarettes    Quit date: 04/14/2004  . Smokeless tobacco: Never Used  . Alcohol Use: No  . Drug Use: No  . Sexual Activity: Not Asked   Other Topics Concern  . None   Social History Narrative    Outpatient Encounter Prescriptions as of 03/11/2015  Medication Sig  . ACCU-CHEK SOFTCLIX LANCETS lancets Check blood sugar twice daily Dx 250.00  . aspirin 81 MG tablet Take 81 mg by mouth 2 (two) times daily.   Marland Kitchen  atorvastatin (LIPITOR) 20 MG tablet TAKE 1 TABLET EVERY DAY  . Biotin (BIOTIN 5000) 5 MG CAPS Take by mouth.  . Blood Glucose Monitoring Suppl (ONE TOUCH ULTRA SYSTEM KIT) W/DEVICE KIT 1 kit by Does not apply route once.  . calcium carbonate 200 MG capsule Take 250 mg by mouth 2 (two) times daily with a meal.  . cholecalciferol (VITAMIN D) 400 UNITS TABS Take by mouth.  . Coenzyme Q10 (COQ10 PO) Take by mouth.  . escitalopram (LEXAPRO) 20 MG tablet TAKE 1 TABLET EVERY DAY  . fish oil-omega-3 fatty acids 1000 MG capsule Take 2 g by mouth daily.  . fluticasone (FLOVENT HFA) 110 MCG/ACT inhaler Inhale 2 puffs into the lungs 2 (two) times daily. Rinse mouth after use  . glucose blood (ACCU-CHEK AVIVA PLUS) test strip Check blood sugar twice daily Dx E11.9  . INVOKANA 100 MG TABS tablet TAKE 1 TABLET EVERY DAY  . IRON PO Take by mouth.  . levothyroxine (SYNTHROID, LEVOTHROID) 25 MCG tablet TAKE 1 TABLET EVERY DAY  . losartan (COZAAR) 100 MG tablet TAKE 1 TABLET EVERY DAY  . metFORMIN (GLUCOPHAGE) 500 MG tablet TAKE 2 TABLETS TWICE  DAILY  . metoprolol succinate (TOPROL-XL) 50 MG 24 hr tablet TAKE 1 TABLET  DAILY. TAKE WITH OR IMMEDIATELY FOLLOWING A MEAL.  . Multiple Vitamin (MULTIVITAMIN WITH MINERALS) TABS tablet Take 1 tablet by mouth daily.  Marland Kitchen nystatin cream (MYCOSTATIN) Apply 1 application topically 2 (two) times daily.  Marland Kitchen omeprazole (PRILOSEC) 20 MG capsule Take 1 capsule (20 mg total) by mouth 2 (two) times daily before a meal.  . TURMERIC PO Take by mouth daily.    No facility-administered encounter medications on file as of 03/11/2015.    Review of Systems  Constitutional: Negative for appetite change and unexpected weight change.  HENT: Negative for congestion and sinus pressure.   Eyes: Negative for discharge and visual disturbance.  Respiratory: Negative for cough (cough is better.  ), chest tightness and shortness of breath.   Cardiovascular: Negative for chest pain, palpitations  and leg swelling.  Gastrointestinal: Negative for nausea, vomiting, abdominal pain and diarrhea.  Genitourinary: Negative for dysuria and difficulty urinating.  Skin: Negative for color change and rash.  Neurological: Negative for dizziness, light-headedness and headaches.  Psychiatric/Behavioral: Negative for dysphoric mood and agitation.       Objective:     Blood pressure rechecked by me:  122/72  Physical Exam  Constitutional: She appears well-developed and well-nourished. No distress.  HENT:  Nose: Nose normal.  Mouth/Throat: Oropharynx is clear and moist.  Eyes: Right eye exhibits no discharge. Left eye exhibits no discharge. No scleral icterus.  Neck: Neck supple. No thyromegaly present.  Cardiovascular: Normal rate and regular rhythm.   Pulmonary/Chest: Breath sounds normal. No respiratory distress. She has no wheezes.  Abdominal: Soft. Bowel sounds are normal. There is no tenderness.  Musculoskeletal: She exhibits no edema or tenderness.  Lymphadenopathy:    She has no cervical adenopathy.  Skin: No rash noted. No erythema.  Psychiatric: She has a normal mood and affect. Her behavior is normal.    BP 108/64 mmHg  Pulse 57  Temp(Src) 98.2 F (36.8 C) (Oral)  Ht '5\' 4"'  (1.626 m)  Wt 145 lb 12.8 oz (66.134 kg)  BMI 25.01 kg/m2  SpO2 92% Wt Readings from Last 3 Encounters:  03/11/15 145 lb 12.8 oz (66.134 kg)  01/19/15 145 lb (65.772 kg)  10/14/14 146 lb 9.6 oz (66.497 kg)     Lab Results  Component Value Date   WBC 9.2 03/11/2015   HGB 14.3 03/11/2015   HCT 43.6 03/11/2015   PLT 325.0 03/11/2015   GLUCOSE 118* 03/11/2015   CHOL 164 03/11/2015   TRIG 93.0 03/11/2015   HDL 53.00 03/11/2015   LDLCALC 92 03/11/2015   ALT 24 03/11/2015   AST 23 03/11/2015   NA 138 03/11/2015   K 4.4 03/11/2015   CL 101 03/11/2015   CREATININE 0.73 03/11/2015   BUN 16 03/11/2015   CO2 29 03/11/2015   TSH 1.62 03/11/2015   HGBA1C 7.2* 03/11/2015   MICROALBUR 1.0  10/11/2014    Dg Chest 2 View  01/21/2015   CLINICAL DATA:  Persistent cough for several months  EXAM: CHEST - 2 VIEW  COMPARISON:  04/24/2013  FINDINGS: The heart size and mediastinal contours are within normal limits. Both lungs are hyperinflated and clear. The visualized skeletal structures are unremarkable. Postsurgical changes are noted in the left axilla  IMPRESSION: COPD without acute abnormality.   Electronically Signed   By: Inez Catalina M.D.   On: 01/21/2015 15:21       Assessment & Plan:  Problem List Items Addressed This Visit    Anemia    hgb today - wnl.  Follow.       Breast cancer    Mammogram 09/20/14 - Birads I.       Cough    Cough is better.  W/u as outlined.  Follow.       Diabetes mellitus    A1c 7.2 on recent check.  Improved.  Low carb diet and exercise.  Follow met b and a1c.  Keep up to date with eye exams.        Relevant Orders   Hemoglobin A1c   GERD (gastroesophageal reflux disease)    No increased reflux reported.  On omeprazole.  Follow.       Hypercholesterolemia    Low cholesterol diet and exercise.  Follow lipid panel.  Recent check - LDL 92.   Lab Results  Component Value Date   CHOL 164 03/11/2015   HDL 53.00 03/11/2015   LDLCALC 92 03/11/2015   TRIG 93.0 03/11/2015   CHOLHDL 3 03/11/2015        Relevant Orders   Hepatic function panel   Lipid panel   Hypertension - Primary    Blood pressure under good control.  Continue same medication regimen.  Follow pressures.  Follow metabolic panel.        Relevant Orders   Basic metabolic panel   Hypothyroidism    On thyroid replacement.  Follow tsh.       Thrombocytosis    Platelet count wnl on check today.           Einar Pheasant, MD

## 2015-03-13 ENCOUNTER — Encounter: Payer: Self-pay | Admitting: Internal Medicine

## 2015-03-13 NOTE — Assessment & Plan Note (Signed)
No increased reflux reported.  On omeprazole.  Follow.

## 2015-03-13 NOTE — Assessment & Plan Note (Signed)
hgb today - wnl.  Follow.

## 2015-03-13 NOTE — Assessment & Plan Note (Signed)
On thyroid replacement.  Follow tsh.  

## 2015-03-13 NOTE — Assessment & Plan Note (Signed)
Low cholesterol diet and exercise.  Follow lipid panel.  Recent check - LDL 92.   Lab Results  Component Value Date   CHOL 164 03/11/2015   HDL 53.00 03/11/2015   LDLCALC 92 03/11/2015   TRIG 93.0 03/11/2015   CHOLHDL 3 03/11/2015

## 2015-03-13 NOTE — Assessment & Plan Note (Signed)
Cough is better.  W/u as outlined.  Follow.

## 2015-03-13 NOTE — Assessment & Plan Note (Signed)
A1c 7.2 on recent check.  Improved.  Low carb diet and exercise.  Follow met b and a1c.  Keep up to date with eye exams.

## 2015-03-13 NOTE — Assessment & Plan Note (Signed)
Mammogram 09/20/14 - Birads I.  

## 2015-03-13 NOTE — Assessment & Plan Note (Signed)
Platelet count wnl on check today.

## 2015-03-13 NOTE — Assessment & Plan Note (Signed)
Blood pressure under good control.  Continue same medication regimen.  Follow pressures.  Follow metabolic panel.   

## 2015-05-17 ENCOUNTER — Telehealth: Payer: Self-pay | Admitting: Internal Medicine

## 2015-05-17 NOTE — Telephone Encounter (Signed)
Pt came in wanting to know if it was ok for her to be off her LEXAPRO for a couple of days till the medicine is filled.. Please advise pt

## 2015-05-17 NOTE — Telephone Encounter (Signed)
Please advise 

## 2015-05-17 NOTE — Telephone Encounter (Signed)
Left a message for her to call the office with a local pharmacy that we can send a short term prescription to.

## 2015-05-17 NOTE — Telephone Encounter (Signed)
Pt called back stating that the Lexapro came in the afternoon. Pt will not need a short supply.. Please advise pt if you have any  questions

## 2015-05-17 NOTE — Telephone Encounter (Signed)
With this type of medication, I would prefer her not to be off the medication while waiting for refill.  Can call in rx locally if waiting for mail order.  Just let me now if I need to do anything.

## 2015-05-18 ENCOUNTER — Ambulatory Visit (INDEPENDENT_AMBULATORY_CARE_PROVIDER_SITE_OTHER): Payer: Commercial Managed Care - HMO

## 2015-05-18 DIAGNOSIS — Z23 Encounter for immunization: Secondary | ICD-10-CM

## 2015-06-09 ENCOUNTER — Other Ambulatory Visit: Payer: Self-pay | Admitting: Internal Medicine

## 2015-06-29 ENCOUNTER — Telehealth: Payer: Self-pay | Admitting: *Deleted

## 2015-06-29 NOTE — Telephone Encounter (Signed)
She is going to need to be seen and assess the need for abx.

## 2015-06-29 NOTE — Telephone Encounter (Signed)
Called patient and scheduled with NP tomorrow.

## 2015-06-29 NOTE — Telephone Encounter (Signed)
Please advise Last OV was 03/11/2015.

## 2015-06-29 NOTE — Telephone Encounter (Signed)
Patient requested a antibiotic, due to her having congestion, yellow mucus, cough and sore throat. Please Advise

## 2015-06-30 ENCOUNTER — Ambulatory Visit (INDEPENDENT_AMBULATORY_CARE_PROVIDER_SITE_OTHER): Payer: Commercial Managed Care - HMO | Admitting: Nurse Practitioner

## 2015-06-30 ENCOUNTER — Encounter: Payer: Self-pay | Admitting: Nurse Practitioner

## 2015-06-30 VITALS — BP 110/62 | HR 61 | Temp 98.1°F | Resp 20 | Ht 64.0 in | Wt 148.0 lb

## 2015-06-30 DIAGNOSIS — J069 Acute upper respiratory infection, unspecified: Secondary | ICD-10-CM

## 2015-06-30 MED ORDER — AMOXICILLIN-POT CLAVULANATE 875-125 MG PO TABS: 1.0000 | ORAL_TABLET | Freq: Two times a day (BID) | ORAL | Status: DC

## 2015-06-30 NOTE — Progress Notes (Signed)
Patient ID: Michelle Hawkins, female    DOB: [birth year]  Age: 69 y.o. MRN: [id number]  CC: Cough; Sore Throat; and Nasal Congestion   HPI Michelle Hawkins presents for CC of congestion 6 days.  1) Patient reports 6 days of symptoms started with a scratchy throat and has progressed to a sore throat that has not resolved. Patient is using over-the-counter measures: Sudafed and Tylenol Patient denies sick contacts Patient reports her throat is the main concern. Denies ear pain, eye pain, headaches Patient does report slight congestion in the nasal area   History Michelle Hawkins has a past medical history of Breast cancer ([place]) (1996); Diabetes mellitus; H/O ulcer disease; GERD (gastroesophageal reflux disease); Hypercholesterolemia; Nephrolithiasis; Hypothyroidism; Scoliosis; Dysphagia; SVT (supraventricular tachycardia) ([place]); and Allergic rhinitis.   She has past surgical history that includes Breast lumpectomy with axillary lymph node dissection (1996); Abdominal hysterectomy; and Lumbar laminectomy (3/07).   Her family history includes Diabetes in her father; Heart disease in her father; Hypertension in her father.She reports that she quit smoking about 11 years ago. Her smoking use included Cigarettes. She has a 20 pack-year smoking history. She has never used smokeless tobacco. She reports that she does not drink alcohol or use illicit drugs.  Outpatient Prescriptions Prior to Visit  Medication Sig Dispense Refill  . ACCU-CHEK AVIVA PLUS test strip TEST BLOOD SUGAR TWICE DAILY 200 each 1  . ACCU-CHEK SOFTCLIX LANCETS lancets Check blood sugar twice daily Dx 250.00 200 each 1  . aspirin 81 MG tablet Take 81 mg by mouth 2 (two) times daily.     Marland Kitchen atorvastatin (LIPITOR) 20 MG tablet TAKE 1 TABLET EVERY DAY 90 tablet 3  . Biotin (BIOTIN 5000) 5 MG CAPS Take by mouth.    . Blood Glucose Monitoring Suppl (ONE TOUCH ULTRA SYSTEM KIT) W/DEVICE KIT 1 kit by Does not apply route once. 1 each 0  . calcium carbonate  200 MG capsule Take 250 mg by mouth 2 (two) times daily with a meal.    . cholecalciferol (VITAMIN D) 400 UNITS TABS Take by mouth.    . Coenzyme Q10 (COQ10 PO) Take by mouth.    . escitalopram (LEXAPRO) 20 MG tablet TAKE 1 TABLET EVERY DAY 90 tablet 1  . fish oil-omega-3 fatty acids 1000 MG capsule Take 2 g by mouth daily.    . fluticasone (FLOVENT HFA) 110 MCG/ACT inhaler Inhale 2 puffs into the lungs 2 (two) times daily. Rinse mouth after use 1 Inhaler 1  . INVOKANA 100 MG TABS tablet TAKE 1 TABLET EVERY DAY 90 tablet 1  . IRON PO Take by mouth.    . levothyroxine (SYNTHROID, LEVOTHROID) 25 MCG tablet TAKE 1 TABLET EVERY DAY 90 tablet 2  . losartan (COZAAR) 100 MG tablet TAKE 1 TABLET EVERY DAY 90 tablet 3  . metFORMIN (GLUCOPHAGE) 500 MG tablet TAKE 2 TABLETS TWICE DAILY 360 tablet 3  . metoprolol succinate (TOPROL-XL) 50 MG 24 hr tablet TAKE 1 TABLET  DAILY. TAKE WITH OR IMMEDIATELY FOLLOWING A MEAL. 90 tablet 3  . Multiple Vitamin (MULTIVITAMIN WITH MINERALS) TABS tablet Take 1 tablet by mouth daily.    Marland Kitchen nystatin cream (MYCOSTATIN) Apply 1 application topically 2 (two) times daily. 30 g 0  . omeprazole (PRILOSEC) 20 MG capsule Take 1 capsule (20 mg total) by mouth 2 (two) times daily before a meal. 180 capsule 1  . TURMERIC PO Take by mouth daily.      No facility-administered medications prior to visit.  ROS Review of Systems  Constitutional: Negative for fever, chills, diaphoresis and fatigue.  HENT: Positive for congestion, postnasal drip, rhinorrhea and sore throat. Negative for ear discharge, ear pain, nosebleeds, sinus pressure and sneezing.   Eyes: Negative for visual disturbance.  Respiratory: Negative for chest tightness, shortness of breath and wheezing.   Cardiovascular: Negative for chest pain, palpitations and leg swelling.  Gastrointestinal: Negative for nausea, vomiting and diarrhea.  Skin: Negative for rash.  Neurological: Negative for dizziness, weakness,  numbness and headaches.  Psychiatric/Behavioral: The patient is not nervous/anxious.    Objective:  BP 110/62 mmHg  Pulse 61  Temp(Src) 98.1 F (36.7 C) (Oral)  Resp 20  Ht '5\' 4"'  (1.626 m)  Wt 148 lb (67.132 kg)  BMI 25.39 kg/m2  SpO2 95%  Physical Exam  Constitutional: She is oriented to person, place, and time. She appears well-developed and well-nourished. No distress.  HENT:  Head: Normocephalic and atraumatic.  Right Ear: External ear normal.  Left Ear: External ear normal.  Oropharynx is erythematous and without exudates TMs clear bilaterally without retraction or bulging  Eyes: EOM are normal. Pupils are equal, round, and reactive to light. Right eye exhibits no discharge. Left eye exhibits no discharge. No scleral icterus.  Neck: Normal range of motion. Neck supple.  Cardiovascular: Normal rate and regular rhythm.  Exam reveals no gallop and no friction rub.   No murmur heard. Pulmonary/Chest: Effort normal and breath sounds normal. No respiratory distress. She has no wheezes. She has no rales. She exhibits no tenderness.  Lymphadenopathy:    She has no cervical adenopathy.  Neurological: She is alert and oriented to person, place, and time. No cranial nerve deficit. She exhibits normal muscle tone. Coordination normal.  Skin: Skin is warm and dry. No rash noted. She is not diaphoretic.  Psychiatric: She has a normal mood and affect. Her behavior is normal. Judgment and thought content normal.   Assessment & Plan:   [name] was seen today for cough, sore throat and nasal congestion.  Diagnoses and all orders for this visit:  Acute upper respiratory infection  Other orders -     amoxicillin-clavulanate (AUGMENTIN) 875-125 MG tablet; Take 1 tablet by mouth 2 (two) times daily.  I am having [name] start on amoxicillin-clavulanate. I am also having her maintain her Biotin, fish oil-omega-3 fatty acids, TURMERIC PO, calcium carbonate, aspirin, cholecalciferol, ONE TOUCH  ULTRA SYSTEM KIT, ACCU-CHEK SOFTCLIX LANCETS, multivitamin with minerals, nystatin cream, Coenzyme Q10 (COQ10 PO), IRON PO, INVOKANA, fluticasone, omeprazole, metFORMIN, metoprolol succinate, atorvastatin, losartan, levothyroxine, escitalopram, ACCU-CHEK AVIVA PLUS, vitamin B-12, and terbinafine.  Meds ordered this encounter  Medications  . vitamin B-12 (CYANOCOBALAMIN) 100 MCG tablet    Sig:   . terbinafine (LAMISIL) 1 % cream    Sig:   . amoxicillin-clavulanate (AUGMENTIN) 875-125 MG tablet    Sig: Take 1 tablet by mouth 2 (two) times daily.    Dispense:  10 tablet    Refill:  0    Order Specific Question:  Supervising Provider    Answer:  Crecencio Mc [[id number]]     Follow-up: Return if symptoms worsen or fail to improve.

## 2015-06-30 NOTE — Progress Notes (Signed)
Pre visit review using our clinic review tool, if applicable. No additional management support is needed unless otherwise documented below in the visit note. 

## 2015-06-30 NOTE — Patient Instructions (Signed)
The post nasal drip may alsoe be contributing to your cough.  Take generic OTC benadryl 25 mg  At bedtime for the drainage flush your sinuses twice daily with Simply Saline   Please take a probiotic ( Align, Flora[place] or Culturelle) while you are on the antibiotic to prevent a serious antibiotic associated diarrhea  Called clostridium dificile colitis and a vaginal yeast infection. Take for 3 weeks at least  Mucniex plain over the counter and warm salt water gargles to help with symptoms.   Follow up with [place] if no improvement or worsening.

## 2015-06-30 NOTE — Assessment & Plan Note (Addendum)
Due to progression of symptoms and length of symptoms we'll treat with Augmentin twice daily. Encouraged probiotics for 3 weeks. Can continue over-the-counter measures, gargling with warm salty water, and plain Mucinex daily. FU prn worsening/failure to improve.

## 2015-07-06 ENCOUNTER — Other Ambulatory Visit (INDEPENDENT_AMBULATORY_CARE_PROVIDER_SITE_OTHER): Payer: Commercial Managed Care - HMO

## 2015-07-06 DIAGNOSIS — I10 Essential (primary) hypertension: Secondary | ICD-10-CM

## 2015-07-06 DIAGNOSIS — D649 Anemia, unspecified: Secondary | ICD-10-CM

## 2015-07-06 DIAGNOSIS — E7800 Pure hypercholesterolemia, unspecified: Secondary | ICD-10-CM | POA: Diagnosis not present

## 2015-07-06 DIAGNOSIS — E119 Type 2 diabetes mellitus without complications: Secondary | ICD-10-CM

## 2015-07-06 LAB — CBC WITH DIFFERENTIAL/PLATELET
BASOS ABS: 0.1 10*3/uL (ref 0.0–0.1)
Basophils Relative: 0.5 % (ref 0.0–3.0)
EOS PCT: 10.9 % — AB (ref 0.0–5.0)
Eosinophils Absolute: 1.1 10*3/uL — ABNORMAL HIGH (ref 0.0–0.7)
HEMATOCRIT: 45 % (ref 36.0–46.0)
Hemoglobin: 14.5 g/dL (ref 12.0–15.0)
LYMPHS PCT: 33.9 % (ref 12.0–46.0)
Lymphs Abs: 3.3 10*3/uL (ref 0.7–4.0)
MCHC: 32.3 g/dL (ref 30.0–36.0)
MCV: 92.9 fl (ref 78.0–100.0)
MONOS PCT: 8.4 % (ref 3.0–12.0)
Monocytes Absolute: 0.8 10*3/uL (ref 0.1–1.0)
NEUTROS ABS: 4.5 10*3/uL (ref 1.4–7.7)
Neutrophils Relative %: 46.3 % (ref 43.0–77.0)
Platelets: 301 10*3/uL (ref 150.0–400.0)
RBC: 4.85 Mil/uL (ref 3.87–5.11)
RDW: 13.9 % (ref 11.5–15.5)
WBC: 9.8 10*3/uL (ref 4.0–10.5)

## 2015-07-06 LAB — LIPID PANEL
CHOL/HDL RATIO: 4
Cholesterol: 178 mg/dL (ref 0–200)
HDL: 49.3 mg/dL (ref >39.00)
LDL CALC: 110 mg/dL — AB (ref 0–99)
NonHDL: 128.65
TRIGLYCERIDES: 94 mg/dL (ref 0.0–149.0)
VLDL: 18.8 mg/dL (ref 0.0–40.0)

## 2015-07-06 LAB — HEPATIC FUNCTION PANEL
ALK PHOS: 69 U/L (ref 39–117)
ALT: 21 U/L (ref 0–35)
AST: 22 U/L (ref 0–37)
Albumin: 4.1 g/dL (ref 3.5–5.2)
BILIRUBIN DIRECT: 0.1 mg/dL (ref 0.0–0.3)
BILIRUBIN TOTAL: 0.5 mg/dL (ref 0.2–1.2)
Total Protein: 7.4 g/dL (ref 6.0–8.3)

## 2015-07-06 LAB — BASIC METABOLIC PANEL
BUN: 18 mg/dL (ref 6–23)
CHLORIDE: 97 meq/L (ref 96–112)
CO2: 26 mEq/L (ref 19–32)
Calcium: 10 mg/dL (ref 8.4–10.5)
Creatinine, Ser: 0.73 mg/dL (ref 0.40–1.20)
GFR: 83.7 mL/min (ref >60.00)
GLUCOSE: 135 mg/dL — AB (ref 70–99)
POTASSIUM: 4.4 meq/L (ref 3.5–5.1)
Sodium: 133 mEq/L — ABNORMAL LOW (ref 135–145)

## 2015-07-06 LAB — FERRITIN: Ferritin: 52.9 ng/mL (ref 10.0–291.0)

## 2015-07-06 LAB — HEMOGLOBIN A1C: Hgb A1c MFr Bld: 7.3 % — ABNORMAL HIGH (ref 4.6–6.5)

## 2015-07-07 ENCOUNTER — Encounter: Payer: Self-pay | Admitting: Internal Medicine

## 2015-07-08 ENCOUNTER — Ambulatory Visit: Payer: Commercial Managed Care - HMO | Admitting: Internal Medicine

## 2015-07-14 ENCOUNTER — Encounter: Payer: Self-pay | Admitting: Internal Medicine

## 2015-07-14 ENCOUNTER — Ambulatory Visit (INDEPENDENT_AMBULATORY_CARE_PROVIDER_SITE_OTHER): Payer: PPO | Admitting: Internal Medicine

## 2015-07-14 VITALS — BP 118/70 | HR 57 | Temp 97.9°F | Resp 17 | Ht 64.0 in | Wt 141.5 lb

## 2015-07-14 DIAGNOSIS — K219 Gastro-esophageal reflux disease without esophagitis: Secondary | ICD-10-CM

## 2015-07-14 DIAGNOSIS — E7800 Pure hypercholesterolemia, unspecified: Secondary | ICD-10-CM

## 2015-07-14 DIAGNOSIS — E871 Hypo-osmolality and hyponatremia: Secondary | ICD-10-CM

## 2015-07-14 DIAGNOSIS — C50919 Malignant neoplasm of unspecified site of unspecified female breast: Secondary | ICD-10-CM

## 2015-07-14 DIAGNOSIS — E119 Type 2 diabetes mellitus without complications: Secondary | ICD-10-CM

## 2015-07-14 DIAGNOSIS — Z1239 Encounter for other screening for malignant neoplasm of breast: Secondary | ICD-10-CM | POA: Diagnosis not present

## 2015-07-14 DIAGNOSIS — I10 Essential (primary) hypertension: Secondary | ICD-10-CM

## 2015-07-14 DIAGNOSIS — E039 Hypothyroidism, unspecified: Secondary | ICD-10-CM

## 2015-07-14 MED ORDER — OMEPRAZOLE 40 MG PO CPDR: 40.0000 mg | DELAYED_RELEASE_CAPSULE | Freq: Every day | ORAL | Status: DC

## 2015-07-14 NOTE — Telephone Encounter (Signed)
Unread mychart message mailed to patient 

## 2015-07-14 NOTE — Progress Notes (Signed)
Patient ID: Michelle Hawkins, female   DOB: 17-Mar-1946, 70 y.o.   MRN: [id number]   Subjective:    Patient ID: Michelle Hawkins, female    DOB: 21-Feb-1946, 70 y.o.   MRN: [id number]  HPI  Patient with past history of breast cancer, diabetes, GERD, hypothyroidism and hypercholesterolemia.  She comes in today to follow up on these issues.  She wants to stop the invokana.  Discussed sugars.  Discussed diet and exercise.  She has not been watching her diet.  Plans to get more serious and wants to see if she can control her sugar without invokana.  Secondary to insurance, wants to change omeprazole to 39m q day.  No acid reflux.  No abdominal pain or cramping.  Had vomiting over this weekend.  Stable now.  Back to eating.  Did lose weight.     Past Medical History  Diagnosis Date  . [place]    s/p lumpectomy (lymph node dissection - 2/11 positive), chemotherapy  . Diabetes mellitus   . H/O ulcer disease     PUD  . GERD (gastroesophageal reflux disease)   . Hypercholesterolemia   . Nephrolithiasis   . Hypothyroidism     multinodular goiter  . Scoliosis   . Dysphagia   . [place]   . Allergic rhinitis    Past Surgical History  Procedure Laterality Date  . Breast lumpectomy with axillary lymph node dissection  1996    left  . Abdominal hysterectomy      partial  . Lumbar laminectomy  3/07    L5-S1   Family History  Problem Relation Age of Onset  . Heart disease Father   . Hypertension Father   . Diabetes Father    Social History   Social History  . Marital Status: Married    Spouse Name: N/A  . Number of Children: N/A  . Years of Education: N/A   Social History Main Topics  . Smoking status: Former Smoker -- 0.50 packs/day for 40 years    Types: Cigarettes    Quit date: 04/14/2004  . Smokeless tobacco: Never Used  . Alcohol Use: No  . Drug Use: No  . Sexual Activity: Not Asked   Other Topics Concern  . None   Social History  Narrative    Outpatient Encounter Prescriptions as of 07/14/2015  Medication Sig  . ACCU-CHEK AVIVA PLUS test strip TEST BLOOD SUGAR TWICE DAILY  . ACCU-CHEK SOFTCLIX LANCETS lancets Check blood sugar twice daily Dx 250.00  . aspirin 81 MG tablet Take 81 mg by mouth 2 (two) times daily.   .Marland Kitchenatorvastatin (LIPITOR) 20 MG tablet TAKE 1 TABLET EVERY DAY  . Biotin (BIOTIN 5000) 5 MG CAPS Take by mouth.  . Blood Glucose Monitoring Suppl (ONE TOUCH ULTRA SYSTEM KIT) W/DEVICE KIT 1 kit by Does not apply route once.  . calcium carbonate 200 MG capsule Take 250 mg by mouth 2 (two) times daily with a meal.  . cholecalciferol (VITAMIN D) 400 UNITS TABS Take by mouth.  . Coenzyme Q10 (COQ10 PO) Take by mouth.  . escitalopram (LEXAPRO) 20 MG tablet TAKE 1 TABLET EVERY DAY  . fish oil-omega-3 fatty acids 1000 MG capsule Take 2 g by mouth daily.  . INVOKANA 100 MG TABS tablet TAKE 1 TABLET EVERY DAY  . IRON PO Take by mouth.  . levothyroxine (SYNTHROID, LEVOTHROID) 25 MCG tablet TAKE 1 TABLET EVERY DAY  . losartan (COZAAR) 100 MG tablet  TAKE 1 TABLET EVERY DAY  . metFORMIN (GLUCOPHAGE) 500 MG tablet TAKE 2 TABLETS TWICE DAILY  . metoprolol succinate (TOPROL-XL) 50 MG 24 hr tablet TAKE 1 TABLET  DAILY. TAKE WITH OR IMMEDIATELY FOLLOWING A MEAL.  . Multiple Vitamin (MULTIVITAMIN WITH MINERALS) TABS tablet Take 1 tablet by mouth daily.  Marland Kitchen nystatin cream (MYCOSTATIN) Apply 1 application topically 2 (two) times daily.  Marland Kitchen terbinafine (LAMISIL) 1 % cream   . TURMERIC PO Take by mouth daily.   . vitamin B-12 (CYANOCOBALAMIN) 100 MCG tablet   . [DISCONTINUED] fluticasone (FLOVENT HFA) 110 MCG/ACT inhaler Inhale 2 puffs into the lungs 2 (two) times daily. Rinse mouth after use  . [DISCONTINUED] omeprazole (PRILOSEC) 20 MG capsule Take 1 capsule (20 mg total) by mouth 2 (two) times daily before a meal.  . omeprazole (PRILOSEC) 40 MG capsule Take 1 capsule (40 mg total) by mouth daily.  . [DISCONTINUED]  amoxicillin-clavulanate (AUGMENTIN) 875-125 MG tablet Take 1 tablet by mouth 2 (two) times daily.   No facility-administered encounter medications on file as of 07/14/2015.    Review of Systems  Constitutional: Negative for appetite change and unexpected weight change.  HENT: Negative for congestion and sinus pressure.   Eyes: Negative for discharge and redness.  Respiratory: Negative for cough, chest tightness and shortness of breath.   Cardiovascular: Negative for chest pain, palpitations and leg swelling.  Gastrointestinal: Negative for nausea, vomiting, abdominal pain and diarrhea.  Genitourinary: Negative for dysuria.  Musculoskeletal: Negative for back pain and joint swelling.  Skin: Negative for color change and rash.  Neurological: Negative for dizziness, light-headedness and headaches.  Psychiatric/Behavioral: Negative for dysphoric mood and agitation.       Objective:    Physical Exam  Constitutional: She appears well-nourished. No distress.  HENT:  Nose: Nose normal.  Mouth/Throat: Oropharynx is clear and moist.  Eyes: Conjunctivae are normal. Right eye exhibits no discharge. Left eye exhibits no discharge.  Neck: Neck supple. No thyromegaly present.  Cardiovascular: Normal rate and regular rhythm.   Pulmonary/Chest: Breath sounds normal. No respiratory distress. She has no wheezes.  Abdominal: Soft. Bowel sounds are normal. There is no tenderness.  Musculoskeletal: She exhibits no edema or tenderness.  Lymphadenopathy:    She has no cervical adenopathy.  Skin: No rash noted. No erythema.  Psychiatric: She has a normal mood and affect. Her behavior is normal.    BP 118/70 mmHg  Pulse 57  Temp(Src) 97.9 F (36.6 C) (Oral)  Resp 17  Ht _0  (1.626 m)  Wt 141 lb 8 oz (64.184 kg)  BMI 24.28 kg/m2  SpO2 96% Wt Readings from Last 3 Encounters:  07/14/15 141 lb 8 oz (64.184 kg)  06/30/15 148 lb (67.132 kg)  03/11/15 145 lb 12.8 oz (66.134 kg)     Lab Results    Component Value Date   WBC 9.8 07/06/2015   HGB 14.5 07/06/2015   HCT 45.0 07/06/2015   PLT 301.0 07/06/2015   GLUCOSE 135* 07/06/2015   CHOL 178 07/06/2015   TRIG 94.0 07/06/2015   HDL 49.30 07/06/2015   LDLCALC 110* 07/06/2015   ALT 21 07/06/2015   AST 22 07/06/2015   NA 133* 07/06/2015   K 4.4 07/06/2015   CL 97 07/06/2015   CREATININE 0.73 07/06/2015   BUN 18 07/06/2015   CO2 26 07/06/2015   TSH 1.62 03/11/2015   HGBA1C 7.3* 07/06/2015   MICROALBUR 1.0 10/11/2014    Dg Chest 2 View  01/21/2015  CLINICAL  DATA:  Persistent cough for several months EXAM: CHEST - 2 VIEW COMPARISON:  04/24/2013 FINDINGS: The heart size and mediastinal contours are within normal limits. Both lungs are hyperinflated and clear. The visualized skeletal structures are unremarkable. Postsurgical changes are noted in the left axilla IMPRESSION: COPD without acute abnormality. Electronically Signed   By: Inez Catalina M.D.   On: 01/21/2015 15:21       Assessment & Plan:   Problem List Items Addressed This Visit    Breast cancer ([place])    Mammogram 09/20/14 - [facility] I.       Diabetes mellitus ([place]) - Primary    a1c just checked 7.3.  Discussed diet and exercise.  Follow met b and a1c.  She wants to stop the invokana and see if she can control with diet and exercise.  Follow.sugars.  Send in readings over the next few weeks.        Relevant Orders   Basic metabolic panel   GERD (gastroesophageal reflux disease)    On omeprazole.  Secondary to insurance, will change to 26m omeprazole q day.  Follow.        Relevant Medications   omeprazole (PRILOSEC) 40 MG capsule   Hypercholesterolemia    On lipitor.  Low cholesterol diet and exercise.  Follow lipid panel and liver function tests.        Hypertension    Blood pressure under good control.  Continue same medication regimen.  Follow pressures.  Follow metabolic panel.        Hyponatremia    Recent sodium slightly decreased, but overall  stable - 133.  Follow.        Relevant Orders   Basic metabolic panel   Hypothyroidism    On thyroid replacement.  Follow tsh.         Other Visit Diagnoses    Screening breast examination        Relevant Orders    MM DIGITAL SCREENING BILATERAL        SEinar Pheasant MD

## 2015-07-14 NOTE — Progress Notes (Signed)
Pre-visit discussion using our clinic review tool. No additional management support is needed unless otherwise documented below in the visit note.  

## 2015-07-17 ENCOUNTER — Encounter: Payer: Self-pay | Admitting: Internal Medicine

## 2015-07-17 NOTE — Assessment & Plan Note (Signed)
On lipitor.  Low cholesterol diet and exercise.  Follow lipid panel and liver function tests.   

## 2015-07-17 NOTE — Assessment & Plan Note (Signed)
a1c just checked 7.3.  Discussed diet and exercise.  Follow met b and a1c.  She wants to stop the invokana and see if she can control with diet and exercise.  Follow.sugars.  Send in readings over the next few weeks.   

## 2015-07-17 NOTE — Assessment & Plan Note (Signed)
On thyroid replacement.  Follow tsh.  

## 2015-07-17 NOTE — Assessment & Plan Note (Signed)
On omeprazole.  Secondary to insurance, will change to 40mg  omeprazole q day.  Follow.

## 2015-07-17 NOTE — Assessment & Plan Note (Signed)
Recent sodium slightly decreased, but overall stable - 133.  Follow.

## 2015-07-17 NOTE — Assessment & Plan Note (Signed)
Blood pressure under good control.  Continue same medication regimen.  Follow pressures.  Follow metabolic panel.   

## 2015-07-17 NOTE — Assessment & Plan Note (Signed)
Mammogram 09/20/14 - Birads I.  

## 2015-08-04 ENCOUNTER — Other Ambulatory Visit (INDEPENDENT_AMBULATORY_CARE_PROVIDER_SITE_OTHER): Payer: PPO

## 2015-08-04 ENCOUNTER — Telehealth: Payer: Self-pay | Admitting: Internal Medicine

## 2015-08-04 ENCOUNTER — Other Ambulatory Visit: Payer: Self-pay

## 2015-08-04 DIAGNOSIS — E119 Type 2 diabetes mellitus without complications: Secondary | ICD-10-CM | POA: Diagnosis not present

## 2015-08-04 DIAGNOSIS — E871 Hypo-osmolality and hyponatremia: Secondary | ICD-10-CM

## 2015-08-04 LAB — BASIC METABOLIC PANEL
BUN: 13 mg/dL (ref 6–23)
CALCIUM: 9.8 mg/dL (ref 8.4–10.5)
CO2: 30 meq/L (ref 19–32)
CREATININE: 0.66 mg/dL (ref 0.40–1.20)
Chloride: 99 mEq/L (ref 96–112)
GFR: 94.01 mL/min (ref >60.00)
GLUCOSE: 123 mg/dL — AB (ref 70–99)
Potassium: 4.2 mEq/L (ref 3.5–5.1)
Sodium: 136 mEq/L (ref 135–145)

## 2015-08-04 MED ORDER — ESCITALOPRAM OXALATE 20 MG PO TABS: 20.0000 mg | ORAL_TABLET | Freq: Every day | ORAL | Status: DC

## 2015-08-04 NOTE — Telephone Encounter (Signed)
Pt dropped off a diabetes blood sugar report for Dr. Nicki Reaper. Paper is in Dr. Bary Leriche box.

## 2015-08-04 NOTE — Telephone Encounter (Signed)
Lexapro Filled

## 2015-08-05 ENCOUNTER — Encounter: Payer: Self-pay | Admitting: Internal Medicine

## 2015-08-10 NOTE — Telephone Encounter (Signed)
Unread mychart message mailed to patient 

## 2015-08-17 ENCOUNTER — Telehealth: Payer: Self-pay | Admitting: Internal Medicine

## 2015-08-17 NOTE — Telephone Encounter (Signed)
Blood sugars reviewed.  My chart message sent.

## 2015-08-18 NOTE — Telephone Encounter (Signed)
Unread mychart message mailed to patient 

## 2015-08-23 ENCOUNTER — Telehealth: Payer: Self-pay | Admitting: Internal Medicine

## 2015-08-23 MED ORDER — LEVOTHYROXINE SODIUM 25 MCG PO TABS: 25.0000 ug | ORAL_TABLET | Freq: Every day | ORAL | Status: DC

## 2015-08-23 MED ORDER — METOPROLOL SUCCINATE ER 50 MG PO TB24: ORAL_TABLET | ORAL | Status: DC

## 2015-08-23 MED ORDER — LOSARTAN POTASSIUM 100 MG PO TABS: 100.0000 mg | ORAL_TABLET | Freq: Every day | ORAL | Status: DC

## 2015-08-23 MED ORDER — ATORVASTATIN CALCIUM 20 MG PO TABS: 20.0000 mg | ORAL_TABLET | Freq: Every day | ORAL | Status: DC

## 2015-08-23 MED ORDER — METFORMIN HCL 500 MG PO TABS: 1000.0000 mg | ORAL_TABLET | Freq: Two times a day (BID) | ORAL | Status: DC

## 2015-08-23 NOTE — Telephone Encounter (Signed)
Refills sent per request.

## 2015-08-23 NOTE — Telephone Encounter (Signed)
Pt needs refills on the following metFORMIN (GLUCOPHAGE) 500 MG tablet, losartan (COZAAR) 100 MG tablet,atorvastatin (LIPITOR) 20 MG tablet,metoprolol succinate (TOPROL-XL) 50 MG 24 hr tablet and levothyroxine (SYNTHROID, LEVOTHROID) 25 MCG tablet 90day supply on all.Durene Cal to [place]

## 2015-09-09 ENCOUNTER — Ambulatory Visit: Payer: PPO

## 2015-09-13 ENCOUNTER — Telehealth: Payer: Self-pay | Admitting: Internal Medicine

## 2015-09-13 MED ORDER — GLUCOSE BLOOD VI STRP: ORAL_STRIP | Status: DC

## 2015-09-13 MED ORDER — ONETOUCH ULTRA SYSTEM W/DEVICE KIT: 1.0000 | PACK | Freq: Once | Status: DC

## 2015-09-13 MED ORDER — ACCU-CHEK SOFTCLIX LANCETS MISC: Status: DC

## 2015-09-13 NOTE — Telephone Encounter (Signed)
Pt called needing a refill for Blood Glucose Monitoring Suppl (ONE TOUCH ULTRA SYSTEM KIT) W/DEVICE KIT , ACCU-CHEK AVIVA PLUS test strip and ACCU-CHEK SOFTCLIX LANCETS lancets. Pharmacy is [facility], [address] Call pt @ [phone]. Thank you!

## 2015-09-13 NOTE — Telephone Encounter (Signed)
Rx filled

## 2015-09-21 ENCOUNTER — Ambulatory Visit (INDEPENDENT_AMBULATORY_CARE_PROVIDER_SITE_OTHER): Payer: PPO

## 2015-09-21 VITALS — BP 120/70 | HR 58 | Temp 97.4°F | Resp 12 | Ht 64.0 in | Wt 148.0 lb

## 2015-09-21 DIAGNOSIS — Z1159 Encounter for screening for other viral diseases: Secondary | ICD-10-CM

## 2015-09-21 DIAGNOSIS — Z0000 Encounter for general adult medical examination without abnormal findings: Secondary | ICD-10-CM

## 2015-09-21 NOTE — Progress Notes (Signed)
Subjective:   Michelle Hawkins is a 70 y.o. female who presents for an Initial Medicare Annual Wellness Visit.  Review of Systems    No ROS.  Medicare Wellness Visit.  Cardiac Risk Factors include: advanced age (>22mn, >>67women);diabetes mellitus;hypertension     Objective:    Today's Vitals   09/21/15 1357  BP: 120/70  Pulse: 58  Temp: 97.4 F (36.3 C)  TempSrc: Oral  Resp: 12  Height: _0  (1.626 m)  Weight: 148 lb (67.132 kg)  SpO2: 96%    Current Medications (verified) Outpatient Encounter Prescriptions as of 09/21/2015  Medication Sig  . Probiotic Product (PROBIOTIC DAILY PO) Take 1 tablet by mouth.  .Marland KitchenACCU-CHEK SOFTCLIX LANCETS lancets Check blood sugar twice daily Dx 250.00  . aspirin 81 MG tablet Take 81 mg by mouth 2 (two) times daily.   .Marland Kitchenatorvastatin (LIPITOR) 20 MG tablet Take 1 tablet (20 mg total) by mouth daily.  . Biotin (BIOTIN 5000) 5 MG CAPS Take by mouth.  . Blood Glucose Monitoring Suppl (ONE TOUCH ULTRA SYSTEM KIT) w/Device KIT 1 kit by Does not apply route once.  . calcium carbonate 200 MG capsule Take 250 mg by mouth 2 (two) times daily with a meal.  . cholecalciferol (VITAMIN D) 400 UNITS TABS Take by mouth.  . escitalopram (LEXAPRO) 20 MG tablet Take 1 tablet (20 mg total) by mouth daily.  . fish oil-omega-3 fatty acids 1000 MG capsule Take 2 g by mouth daily.  .Marland Kitchenglucose blood (ACCU-CHEK AVIVA PLUS) test strip TEST BLOOD SUGAR TWICE DAILY  . IRON PO Take by mouth.  . levothyroxine (SYNTHROID, LEVOTHROID) 25 MCG tablet Take 1 tablet (25 mcg total) by mouth daily.  .Marland Kitchenlosartan (COZAAR) 100 MG tablet Take 1 tablet (100 mg total) by mouth daily.  . metFORMIN (GLUCOPHAGE) 500 MG tablet Take 2 tablets (1,000 mg total) by mouth 2 (two) times daily.  . metoprolol succinate (TOPROL-XL) 50 MG 24 hr tablet TAKE 1 TABLET  DAILY. TAKE WITH OR IMMEDIATELY FOLLOWING A MEAL.  . Multiple Vitamin (MULTIVITAMIN WITH MINERALS) TABS tablet Take 1 tablet by mouth  daily.  .Marland Kitchenomeprazole (PRILOSEC) 40 MG capsule Take 1 capsule (40 mg total) by mouth daily.  . TURMERIC PO Take by mouth daily.   . [DISCONTINUED] Coenzyme Q10 (COQ10 PO) Take by mouth.  . [DISCONTINUED] INVOKANA 100 MG TABS tablet TAKE 1 TABLET EVERY DAY  . [DISCONTINUED] nystatin cream (MYCOSTATIN) Apply 1 application topically 2 (two) times daily.  . [DISCONTINUED] terbinafine (LAMISIL) 1 % cream   . [DISCONTINUED] vitamin B-12 (CYANOCOBALAMIN) 100 MCG tablet    No facility-administered encounter medications on file as of 09/21/2015.    Allergies (verified) Review of patient's allergies indicates no known allergies.   History: Past Medical History  Diagnosis Date  . [place]    s/p lumpectomy (lymph node dissection - 2/11 positive), chemotherapy  . Diabetes mellitus   . H/O ulcer disease     PUD  . GERD (gastroesophageal reflux disease)   . Hypercholesterolemia   . Nephrolithiasis   . Hypothyroidism     multinodular goiter  . Scoliosis   . Dysphagia   . [place]) [place]   . Allergic rhinitis    Past Surgical History  Procedure Laterality Date  . Breast lumpectomy with axillary lymph node dissection  1996    left  . Abdominal hysterectomy      partial  . Lumbar laminectomy  3/07  L5-S1   Family History  Problem Relation Age of Onset  . Heart disease Father   . Hypertension Father   . Diabetes Father    Social History   Occupational History  . Not on file.   Social History Main Topics  . Smoking status: Former Smoker -- 0.50 packs/day for 40 years    Types: Cigarettes    Quit date: 04/14/2004  . Smokeless tobacco: Never Used  . Alcohol Use: No  . Drug Use: No  . Sexual Activity: No    Tobacco Counseling Counseling given: Not Answered   Activities of Daily Living In your present state of health, do you have any difficulty performing the following activities: 09/21/2015 10/14/2014  Hearing? N N  Vision? N N    Difficulty concentrating or making decisions? N N  Walking or climbing stairs? Y -  Dressing or bathing? N N  Doing errands, shopping? N N  Preparing Food and eating ? N -  Using the Toilet? N -  In the past six months, have you accidently leaked urine? N -  Do you have problems with loss of bowel control? N -  Managing your Medications? N -  Managing your Finances? N -  Housekeeping or managing your Housekeeping? N -    Immunizations and Health Maintenance Immunization History  Administered Date(s) Administered  . Influenza Split 04/14/2012  . Influenza,inj,Quad PF,36+ Mos 04/01/2013, 03/31/2014, 05/18/2015  . Pneumococcal Conjugate-13 04/06/2014  . Pneumococcal Polysaccharide-23 06/26/2010   Health Maintenance Due  Topic Date Due  . Hepatitis C Screening  1946-04-05  . TETANUS/TDAP  03/29/1964  . MAMMOGRAM  09/20/2015    Patient Care Team: Einar Pheasant, MD as PCP - General (Internal Medicine)  Indicate any recent Medical Services you may have received from other than [facility] providers in the past year (date may be approximate).     Assessment:   This is a routine wellness examination for Michelle Hawkins. The goal of the wellness visit is to assist the patient how to close the gaps in care and create a preventative care plan for the patient.   Taking VIT D as appropriate/Osteoporosis risk reviewed.  Medications reviewed; taking without issues or barriers.  Safety issues reviewed; smoke detectors in the home. No firearms in the home.  Wears seatbelts when driving or riding with others. No violence in the home.  No identified risk were noted; The patient was oriented x 3; appropriate in dress and manner and no objective failures at ADL's or IADL's.   TDAP vaccine postponed for follow up with insurance, per patient request.  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  Patient Concerns:  Request to add on Hepatitis C screening to fasting lab appointment on 10/24/15.  BS taken at home are fluctuating since she started using a new meter; encouraged to bring home record of numbers to upcoming physical appointment.    Hearing/Vision screen Hearing Screening Comments: Passes the whisper test Vision Screening Comments: Followed by [facility] Annual visits Last OV 12/2014 Wears reading glasses   Dietary issues and exercise activities discussed: Current Exercise Habits: The patient does not participate in regular exercise at present  Goals    . Increase lean proteins     Low carb foods.  Chicken, [place], fish.  Fruits and vegetables.    . Increase physical activity     Resume Silver Sneakers program 3 times a week.    Marland Kitchen  Increase water intake     Stay hydrated!  Drink plenty of fluids.      Depression Screen PHQ 2/9 Scores 09/21/2015 01/19/2015 01/01/2014 10/06/2012  PHQ - 2 Score 0 0 0 0    Fall Risk Fall Risk  09/21/2015 01/19/2015 01/01/2014 10/06/2012  Falls in the past year? Yes No Yes No  Number falls in past yr: 1 - 1 -  Injury with Fall? No - No -  Follow up Education provided;Falls prevention discussed - - -    Cognitive Function: MMSE - Mini Mental State Exam 09/21/2015  Orientation to time 5  Orientation to Place 5  Registration 3  Attention/ Calculation 5  Recall 3  Language- name 2 objects 2  Language- repeat 1  Language- follow 3 step command 3  Language- read & follow direction 1  Write a sentence 1  Copy design 1  Total score 30    Screening Tests Health Maintenance  Topic Date Due  . Hepatitis C Screening  04-03-1946  . TETANUS/TDAP  03/29/1964  . MAMMOGRAM  09/20/2015  . FOOT EXAM  10/14/2015  . HEMOGLOBIN A1C  01/04/2016  . OPHTHALMOLOGY EXAM  01/19/2016  . INFLUENZA VACCINE  02/07/2016  . COLONOSCOPY  10/15/2022  . DEXA SCAN  Completed  . ZOSTAVAX  Addressed  . PNA vac Low Risk Adult   Completed      Plan:   End of life planning; Advance aging; Advanced directives discussed. Copy of current HCPOA/Living Will requested.    During the course of the visit, Michelle Hawkins was educated and counseled about the following appropriate screening and preventive services:   Vaccines to include Pneumoccal, Influenza, Hepatitis B, Td, Zostavax, HCV  Electrocardiogram  Cardiovascular disease screening  Colorectal cancer screening  Bone density screening  Diabetes screening  Glaucoma screening  Mammography/PAP  Nutrition counseling  Smoking cessation counseling  Patient Instructions (the written plan) were given to the patient.    Varney Biles, LPN   [id number]    Reviewed above information.  Agree with plan.    Dr Nicki Reaper

## 2015-09-21 NOTE — Patient Instructions (Addendum)
[name] , Thank you for taking time to come for your Medicare Wellness Visit. I appreciate your ongoing commitment to your health goals. Please review the following plan we discussed and let me know if I can assist you in the future.     This is a list of the screening recommended for you and due dates:  Health Maintenance  Topic Date Due  .  Hepatitis C: One time screening is recommended by Center for Disease Control  (CDC) for  adults born from [birth year] through 1966.   [birth year]  . Tetanus Vaccine  03/29/1970  . Mammogram  09/20/2015  . Complete foot exam   10/14/2015  . Hemoglobin A1C  01/04/2016  . Eye exam for diabetics  01/19/2016  . Flu Shot  02/07/2016  . Colon Cancer Screening  10/15/2022  . DEXA scan (bone density measurement)  Completed  . Shingles Vaccine  Addressed  . Pneumonia vaccines  Completed    Mammogram A mammogram is an X-ray of the breasts that is done to check for abnormal changes. This procedure can screen for and detect any changes that may suggest breast cancer. A mammogram can also identify other changes and variations in the breast, such as:  Inflammation of the breast tissue (mastitis).  An infected area that contains a collection of pus (abscess).  A fluid-filled sac (cyst).  Fibrocystic changes. This is when breast tissue becomes denser, which can make the tissue feel rope-like or uneven under the skin.  Tumors that are not cancerous (benign). LET [facility] CARE PROVIDER KNOW ABOUT:  Any allergies you have.  If you have breast implants.  If you have had previous breast disease, biopsy, or surgery.  If you are breastfeeding.  Any possibility that you could be pregnant, if this applies.  If you are younger than age 71.  If you have a family history of breast cancer. RISKS AND COMPLICATIONS Generally, this is a safe procedure. However, problems may occur, including:  Exposure to radiation. Radiation levels are very low with this  test.  The results being misinterpreted.  The need for further tests.  The inability of the mammogram to detect certain cancers. BEFORE THE PROCEDURE  Schedule your test about 1-2 weeks after your menstrual period. This is usually when your breasts are the least tender.  If you have had a mammogram done at a different facility in the past, get the mammogram X-rays or have them sent to your current exam facility in order to compare them.  Wash your breasts and under your arms the day of the test.  Do not wear deodorants, perfumes, lotions, or powders anywhere on your body on the day of the test.  Remove any jewelry from your neck.  Wear clothes that you can change into and out of easily. PROCEDURE  You will undress from the waist up and put on a gown.  You will stand in front of the X-ray machine.  Each breast will be placed between two plastic or glass plates. The plates will compress your breast for a few seconds. Try to stay as relaxed as possible during the procedure. This does not cause any harm to your breasts and any discomfort you feel will be very brief.  X-rays will be taken from different angles of each breast. The procedure may vary among health care providers and hospitals. AFTER THE PROCEDURE  The mammogram will be examined by a specialist (radiologist).  You may need to repeat certain parts of the test,  depending on the quality of the images. This is commonly done if the radiologist needs a better view of the breast tissue.  Ask when your test results will be ready. Make sure you get your test results.  You may resume your normal activities.   This information is not intended to replace advice given to you by your health care provider. Make sure you discuss any questions you have with your health care provider.   Document Released: 06/22/2000 Document Revised: 03/16/2015 Document Reviewed: 09/03/2014 Elsevier Interactive Patient Education 2016 [place] in the Home  Falls can cause injuries. They can happen to people of all ages. There are many things you can do to make your home safe and to help prevent falls.  WHAT CAN I DO ON THE OUTSIDE OF MY HOME?  Regularly fix the edges of walkways and driveways and fix any cracks.  Remove anything that might make you trip as you walk through a door, such as a raised step or threshold.  Trim any bushes or trees on the path to your home.  Use bright outdoor lighting.  Clear any walking paths of anything that might make someone trip, such as rocks or tools.  Regularly check to see if handrails are loose or broken. Make sure that both sides of any steps have handrails.  Any raised decks and porches should have guardrails on the edges.  Have any leaves, snow, or ice cleared regularly.  Use sand or salt on walking paths during winter.  Clean up any spills in your garage right away. This includes oil or grease spills. WHAT CAN I DO IN THE BATHROOM?   Use night lights.  Install grab bars by the toilet and in the tub and shower. Do not use towel bars as grab bars.  Use non-skid mats or decals in the tub or shower.  If you need to sit down in the shower, use a plastic, non-slip stool.  Keep the floor dry. Clean up any water that spills on the floor as soon as it happens.  Remove soap buildup in the tub or shower regularly.  Attach bath mats securely with double-sided non-slip rug tape.  Do not have throw rugs and other things on the floor that can make you trip. WHAT CAN I DO IN THE BEDROOM?  Use night lights.  Make sure that you have a light by your bed that is easy to reach.  Do not use any sheets or blankets that are too big for your bed. They should not hang down onto the floor.  Have a firm chair that has side arms. You can use this for support while you get dressed.  Do not have throw rugs and other things on the floor that can make you trip. WHAT CAN I  DO IN THE KITCHEN?  Clean up any spills right away.  Avoid walking on wet floors.  Keep items that you use a lot in easy-to-reach places.  If you need to reach something above you, use a strong step stool that has a grab bar.  Keep electrical cords out of the way.  Do not use floor polish or wax that makes floors slippery. If you must use wax, use non-skid floor wax.  Do not have throw rugs and other things on the floor that can make you trip. WHAT CAN I DO WITH MY STAIRS?  Do not leave any items on the stairs.  Make sure that there are handrails on  both sides of the stairs and use them. Fix handrails that are broken or loose. Make sure that handrails are as long as the stairways.  Check any carpeting to make sure that it is firmly attached to the stairs. Fix any carpet that is loose or worn.  Avoid having throw rugs at the top or bottom of the stairs. If you do have throw rugs, attach them to the floor with carpet tape.  Make sure that you have a light switch at the top of the stairs and the bottom of the stairs. If you do not have them, ask someone to add them for you. WHAT ELSE CAN I DO TO HELP PREVENT FALLS?  Wear shoes that:  Do not have high heels.  Have rubber bottoms.  Are comfortable and fit you well.  Are closed at the toe. Do not wear sandals.  If you use a stepladder:  Make sure that it is fully opened. Do not climb a closed stepladder.  Make sure that both sides of the stepladder are locked into place.  Ask someone to hold it for you, if possible.  Clearly mark and make sure that you can see:  Any grab bars or handrails.  First and last steps.  Where the edge of each step is.  Use tools that help you move around (mobility aids) if they are needed. These include:  Canes.  Walkers.  Scooters.  Crutches.  Turn on the lights when you go into a dark area. Replace any light bulbs as soon as they burn out.  Set up your furniture so you have a  clear path. Avoid moving your furniture around.  If any of your floors are uneven, fix them.  If there are any pets around you, be aware of where they are.  Review your medicines with your doctor. Some medicines can make you feel dizzy. This can increase your chance of falling. Ask your doctor what other things that you can do to help prevent falls.   This information is not intended to replace advice given to you by your health care provider. Make sure you discuss any questions you have with your health care provider.   Document Released: 04/21/2009 Document Revised: 11/09/2014 Document Reviewed: 07/30/2014 Elsevier Interactive Patient Education [employer].

## 2015-09-22 ENCOUNTER — Ambulatory Visit
Admission: RE | Admit: 2015-09-22 | Discharge: 2015-09-22 | Disposition: A | Discharge status: Home / Self Care | Payer: PPO | Source: Ambulatory Visit | Attending: Internal Medicine | Admitting: Internal Medicine

## 2015-09-22 ENCOUNTER — Other Ambulatory Visit: Payer: Self-pay | Admitting: Internal Medicine

## 2015-09-22 DIAGNOSIS — Z1239 Encounter for other screening for malignant neoplasm of breast: Secondary | ICD-10-CM

## 2015-09-22 DIAGNOSIS — E7800 Pure hypercholesterolemia, unspecified: Secondary | ICD-10-CM

## 2015-09-22 DIAGNOSIS — Z1231 Encounter for screening mammogram for malignant neoplasm of breast: Secondary | ICD-10-CM | POA: Insufficient documentation

## 2015-09-22 DIAGNOSIS — D649 Anemia, unspecified: Secondary | ICD-10-CM

## 2015-09-22 DIAGNOSIS — E119 Type 2 diabetes mellitus without complications: Secondary | ICD-10-CM

## 2015-09-22 DIAGNOSIS — I10 Essential (primary) hypertension: Secondary | ICD-10-CM

## 2015-09-22 HISTORY — DX: Unspecified malignant neoplasm of skin, unspecified: C44.90

## 2015-09-22 NOTE — Progress Notes (Signed)
Orders placed for labs

## 2015-10-24 ENCOUNTER — Other Ambulatory Visit (INDEPENDENT_AMBULATORY_CARE_PROVIDER_SITE_OTHER): Payer: PPO

## 2015-10-24 DIAGNOSIS — Z1159 Encounter for screening for other viral diseases: Secondary | ICD-10-CM

## 2015-10-24 DIAGNOSIS — D649 Anemia, unspecified: Secondary | ICD-10-CM

## 2015-10-24 DIAGNOSIS — E119 Type 2 diabetes mellitus without complications: Secondary | ICD-10-CM | POA: Diagnosis not present

## 2015-10-24 DIAGNOSIS — E7800 Pure hypercholesterolemia, unspecified: Secondary | ICD-10-CM | POA: Diagnosis not present

## 2015-10-24 LAB — CBC WITH DIFFERENTIAL/PLATELET
BASOS PCT: 0.8 % (ref 0.0–3.0)
Basophils Absolute: 0.1 10*3/uL (ref 0.0–0.1)
EOS PCT: 11.4 % — AB (ref 0.0–5.0)
Eosinophils Absolute: 1 10*3/uL — ABNORMAL HIGH (ref 0.0–0.7)
HCT: 39.2 % (ref 36.0–46.0)
Hemoglobin: 13.1 g/dL (ref 12.0–15.0)
LYMPHS ABS: 3 10*3/uL (ref 0.7–4.0)
Lymphocytes Relative: 34.4 % (ref 12.0–46.0)
MCHC: 33.3 g/dL (ref 30.0–36.0)
MCV: 90.7 fl (ref 78.0–100.0)
MONO ABS: 0.9 10*3/uL (ref 0.1–1.0)
Monocytes Relative: 10.4 % (ref 3.0–12.0)
NEUTROS PCT: 43 % (ref 43.0–77.0)
Neutro Abs: 3.7 10*3/uL (ref 1.4–7.7)
PLATELETS: 314 10*3/uL (ref 150.0–400.0)
RBC: 4.33 Mil/uL (ref 3.87–5.11)
RDW: 14 % (ref 11.5–15.5)
WBC: 8.6 10*3/uL (ref 4.0–10.5)

## 2015-10-24 LAB — LIPID PANEL
CHOLESTEROL: 174 mg/dL (ref 0–200)
HDL: 58.1 mg/dL (ref >39.00)
LDL Cholesterol: 93 mg/dL (ref 0–99)
NONHDL: 115.92
Total CHOL/HDL Ratio: 3
Triglycerides: 116 mg/dL (ref 0.0–149.0)
VLDL: 23.2 mg/dL (ref 0.0–40.0)

## 2015-10-24 LAB — HEPATIC FUNCTION PANEL
ALBUMIN: 4 g/dL (ref 3.5–5.2)
ALK PHOS: 61 U/L (ref 39–117)
ALT: 30 U/L (ref 0–35)
AST: 25 U/L (ref 0–37)
Bilirubin, Direct: 0.1 mg/dL (ref 0.0–0.3)
TOTAL PROTEIN: 7.1 g/dL (ref 6.0–8.3)
Total Bilirubin: 0.4 mg/dL (ref 0.2–1.2)

## 2015-10-24 LAB — BASIC METABOLIC PANEL
BUN: 12 mg/dL (ref 6–23)
CALCIUM: 9.5 mg/dL (ref 8.4–10.5)
CHLORIDE: 97 meq/L (ref 96–112)
CO2: 30 mEq/L (ref 19–32)
CREATININE: 0.67 mg/dL (ref 0.40–1.20)
GFR: 92.33 mL/min (ref >60.00)
Glucose, Bld: 135 mg/dL — ABNORMAL HIGH (ref 70–99)
Potassium: 4.7 mEq/L (ref 3.5–5.1)
Sodium: 133 mEq/L — ABNORMAL LOW (ref 135–145)

## 2015-10-24 LAB — MICROALBUMIN / CREATININE URINE RATIO
Creatinine,U: 136 mg/dL
MICROALB/CREAT RATIO: 1.4 mg/g (ref 0.0–30.0)
Microalb, Ur: 1.9 mg/dL (ref 0.0–1.9)

## 2015-10-24 LAB — FERRITIN: FERRITIN: 71.4 ng/mL (ref 10.0–291.0)

## 2015-10-24 LAB — HEMOGLOBIN A1C: HEMOGLOBIN A1C: 7.4 % — AB (ref 4.6–6.5)

## 2015-10-25 LAB — HEPATITIS C ANTIBODY: HCV AB: NEGATIVE

## 2015-10-26 ENCOUNTER — Ambulatory Visit (INDEPENDENT_AMBULATORY_CARE_PROVIDER_SITE_OTHER): Payer: PPO | Admitting: Internal Medicine

## 2015-10-26 ENCOUNTER — Encounter: Payer: Self-pay | Admitting: Internal Medicine

## 2015-10-26 VITALS — BP 138/70 | HR 61 | Temp 98.7°F | Resp 18 | Ht 63.75 in | Wt 148.0 lb

## 2015-10-26 DIAGNOSIS — E039 Hypothyroidism, unspecified: Secondary | ICD-10-CM

## 2015-10-26 DIAGNOSIS — I10 Essential (primary) hypertension: Secondary | ICD-10-CM | POA: Diagnosis not present

## 2015-10-26 DIAGNOSIS — E119 Type 2 diabetes mellitus without complications: Secondary | ICD-10-CM | POA: Diagnosis not present

## 2015-10-26 DIAGNOSIS — E871 Hypo-osmolality and hyponatremia: Secondary | ICD-10-CM

## 2015-10-26 DIAGNOSIS — K219 Gastro-esophageal reflux disease without esophagitis: Secondary | ICD-10-CM | POA: Diagnosis not present

## 2015-10-26 DIAGNOSIS — E7800 Pure hypercholesterolemia, unspecified: Secondary | ICD-10-CM

## 2015-10-26 DIAGNOSIS — C50919 Malignant neoplasm of unspecified site of unspecified female breast: Secondary | ICD-10-CM

## 2015-10-26 NOTE — Progress Notes (Signed)
Pre-visit discussion using our clinic review tool. No additional management support is needed unless otherwise documented below in the visit note.  

## 2015-10-26 NOTE — Progress Notes (Signed)
Patient ID: Michelle Hawkins, female   DOB: 09-04-1945, 70 y.o.   MRN: [id number]   Subjective:    Patient ID: Michelle Hawkins, female    DOB: 05-03-1946, 70 y.o.   MRN: [id number]  HPI  Patient here for a scheduled follow up.  She states she is doing well.  Feels she handling stress well.  Blood sugars as outlined.  Reviewed.  a1c just checked 7.4.  Discussed diet and exercise.  She has been eating a lot of sweets.  She plans to adjust her diet.  She tries to stay active.  Discussed exercise.  No chest pain or tightness.  No sob.  No abdominal pain or cramping.  Bowels stable.     Past Medical History  Diagnosis Date  . Diabetes mellitus   . H/O ulcer disease     PUD  . GERD (gastroesophageal reflux disease)   . Hypercholesterolemia   . Nephrolithiasis   . Hypothyroidism     multinodular goiter  . Scoliosis   . Dysphagia   . SVT (supraventricular tachycardia) ([place])   . Allergic rhinitis   . Breast cancer ([place]) 1996    s/p left breast lumpectomy (lymph node dissection - 2/11 positive), chemotherapy  . Breast cancer ([place]) 1995    right breast lumpectomy  . Skin cancer    Past Surgical History  Procedure Laterality Date  . Breast lumpectomy with axillary lymph node dissection  1996    left  . Abdominal hysterectomy      partial  . Lumbar laminectomy  3/07    L5-S1  . Breast biopsy Left 09/2012    benign   Family History  Problem Relation Age of Onset  . Heart disease Father   . Hypertension Father   . Diabetes Father    Social History   Social History  . Marital Status: Married    Spouse Name: N/A  . Number of Children: N/A  . Years of Education: N/A   Social History Main Topics  . Smoking status: Former Smoker -- 0.50 packs/day for 40 years    Types: Cigarettes    Quit date: 04/14/2004  . Smokeless tobacco: Never Used  . Alcohol Use: No  . Drug Use: No  . Sexual Activity: No   Other Topics Concern  . None   Social History Narrative    Outpatient Encounter  Prescriptions as of 10/26/2015  Medication Sig  . ACCU-CHEK SOFTCLIX LANCETS lancets Check blood sugar twice daily Dx 250.00  . aspirin 81 MG tablet Take 81 mg by mouth 2 (two) times daily.   Marland Kitchen atorvastatin (LIPITOR) 20 MG tablet Take 1 tablet (20 mg total) by mouth daily.  . Biotin (BIOTIN 5000) 5 MG CAPS Take by mouth.  . Blood Glucose Monitoring Suppl (ONE TOUCH ULTRA SYSTEM KIT) w/Device KIT 1 kit by Does not apply route once.  . Blood Glucose Monitoring Suppl (ONE TOUCH ULTRA SYSTEM KIT) w/Device KIT 1 kit by Does not apply route once.  . calcium carbonate 200 MG capsule Take 250 mg by mouth 2 (two) times daily with a meal.  . cholecalciferol (VITAMIN D) 400 UNITS TABS Take by mouth.  . escitalopram (LEXAPRO) 20 MG tablet Take 1 tablet (20 mg total) by mouth daily.  . fish oil-omega-3 fatty acids 1000 MG capsule Take 2 g by mouth daily.  Marland Kitchen glucose blood (ACCU-CHEK AVIVA PLUS) test strip TEST BLOOD SUGAR TWICE DAILY  . IRON PO Take by mouth.  . levothyroxine (SYNTHROID, LEVOTHROID)  25 MCG tablet Take 1 tablet (25 mcg total) by mouth daily.  Marland Kitchen losartan (COZAAR) 100 MG tablet Take 1 tablet (100 mg total) by mouth daily.  . metFORMIN (GLUCOPHAGE) 500 MG tablet Take 2 tablets (1,000 mg total) by mouth 2 (two) times daily.  . metoprolol succinate (TOPROL-XL) 50 MG 24 hr tablet TAKE 1 TABLET  DAILY. TAKE WITH OR IMMEDIATELY FOLLOWING A MEAL.  . Multiple Vitamin (MULTIVITAMIN WITH MINERALS) TABS tablet Take 1 tablet by mouth daily.  Marland Kitchen omeprazole (PRILOSEC) 40 MG capsule Take 1 capsule (40 mg total) by mouth daily.  . Probiotic Product (PROBIOTIC DAILY PO) Take 1 tablet by mouth.  . TURMERIC PO Take by mouth daily.    No facility-administered encounter medications on file as of 10/26/2015.    Review of Systems  Constitutional: Negative for appetite change and unexpected weight change.  HENT: Negative for congestion and sinus pressure.   Respiratory: Negative for cough, chest tightness and  shortness of breath.   Cardiovascular: Negative for chest pain, palpitations and leg swelling.  Gastrointestinal: Negative for nausea, vomiting, abdominal pain and diarrhea.  Genitourinary: Negative for dysuria and difficulty urinating.  Musculoskeletal: Negative for back pain and joint swelling.  Skin: Negative for color change and rash.  Neurological: Negative for dizziness, light-headedness and headaches.  Psychiatric/Behavioral: Negative for dysphoric mood and agitation.       Objective:    Physical Exam  Constitutional: She appears well-developed and well-nourished. No distress.  HENT:  Nose: Nose normal.  Mouth/Throat: Oropharynx is clear and moist.  Neck: Neck supple. No thyromegaly present.  Cardiovascular: Normal rate and regular rhythm.   Pulmonary/Chest: Breath sounds normal. No respiratory distress. She has no wheezes.  Abdominal: Soft. Bowel sounds are normal. There is no tenderness.  Musculoskeletal: She exhibits no edema or tenderness.  Lymphadenopathy:    She has no cervical adenopathy.  Skin: No rash noted. No erythema.  Psychiatric: She has a normal mood and affect. Her behavior is normal.    BP 138/70 mmHg  Pulse 61  Temp(Src) 98.7 F (37.1 C) (Oral)  Resp 18  Ht 5' 3.75" (1.619 m)  Wt 148 lb (67.132 kg)  BMI 25.61 kg/m2  SpO2 95% Wt Readings from Last 3 Encounters:  10/26/15 148 lb (67.132 kg)  09/21/15 148 lb (67.132 kg)  07/14/15 141 lb 8 oz (64.184 kg)     Lab Results  Component Value Date   WBC 8.6 10/24/2015   HGB 13.1 10/24/2015   HCT 39.2 10/24/2015   PLT 314.0 10/24/2015   GLUCOSE 135* 10/24/2015   CHOL 174 10/24/2015   TRIG 116.0 10/24/2015   HDL 58.10 10/24/2015   LDLCALC 93 10/24/2015   ALT 30 10/24/2015   AST 25 10/24/2015   NA 133* 10/24/2015   K 4.7 10/24/2015   CL 97 10/24/2015   CREATININE 0.67 10/24/2015   BUN 12 10/24/2015   CO2 30 10/24/2015   TSH 1.62 03/11/2015   HGBA1C 7.4* 10/24/2015   MICROALBUR 1.9  10/24/2015    Mm Screening Breast Tomo Bilateral  09/22/2015  CLINICAL DATA:  Screening. History of bilateral breast cancers and lumpectomies in the 90s. EXAM: DIGITAL SCREENING BILATERAL MAMMOGRAM WITH 3D TOMO WITH CAD COMPARISON:  Previous exam(s). ACR Breast Density Category b: There are scattered areas of fibroglandular density. FINDINGS: There are no findings suspicious for malignancy. Bilateral breast scarring again noted. Images were processed with CAD. IMPRESSION: No mammographic evidence of malignancy. A result letter of this screening mammogram will be  mailed directly to the patient. RECOMMENDATION: Screening mammogram in one year. (Code:SM-B-01Y) BI-RADS CATEGORY  2: Benign. Electronically Signed   By: Margarette Canada M.D.   On: 09/22/2015 13:46       Assessment & Plan:   Problem List Items Addressed This Visit    Breast cancer ([place])    Mammogram 09/22/15 - [facility] II.       Diabetes mellitus ([place])    a1c just checked 7.4.  Low carb diet and exercise.  Follow met b and a1c.  Plans to adjust her diet.  Desires no additional medication.  Follow.        Relevant Orders   Hemoglobin A1c   GERD (gastroesophageal reflux disease)    On omeprazole.  Upper symptoms controlled.        Hypercholesterolemia    On lipitor.  Low cholesterol diet and exercise.  Follow lipid panel and liver function tests.        Relevant Orders   Lipid panel   Hepatic function panel   Hypertension - Primary    Blood pressure under good control.  Continue same medication regimen.  Follow pressures.  Follow metabolic panel.        Relevant Orders   Basic metabolic panel   Hyponatremia    Sodium slightly decreased.  Follow sodium level.        Hypothyroidism    On thyroid replacement.  Follow tsh.        Relevant Orders   TSH       Einar Pheasant, MD

## 2015-11-06 ENCOUNTER — Encounter: Payer: Self-pay | Admitting: Internal Medicine

## 2015-11-06 NOTE — Assessment & Plan Note (Signed)
Sodium slightly decreased.  Follow sodium level.

## 2015-11-06 NOTE — Assessment & Plan Note (Signed)
a1c just checked 7.4.  Low carb diet and exercise.  Follow met b and a1c.  Plans to adjust her diet.  Desires no additional medication.  Follow.

## 2015-11-06 NOTE — Assessment & Plan Note (Signed)
On omeprazole.  Upper symptoms controlled.   

## 2015-11-06 NOTE — Assessment & Plan Note (Signed)
On thyroid replacement.  Follow tsh.  

## 2015-11-06 NOTE — Assessment & Plan Note (Signed)
On lipitor.  Low cholesterol diet and exercise.  Follow lipid panel and liver function tests.   

## 2015-11-06 NOTE — Assessment & Plan Note (Signed)
Blood pressure under good control.  Continue same medication regimen.  Follow pressures.  Follow metabolic panel.   

## 2015-11-06 NOTE — Assessment & Plan Note (Signed)
Mammogram 3/16.17 - Birads II.   

## 2015-11-10 DIAGNOSIS — L720 Epidermal cyst: Secondary | ICD-10-CM | POA: Diagnosis not present

## 2015-11-10 DIAGNOSIS — L814 Other melanin hyperpigmentation: Secondary | ICD-10-CM | POA: Diagnosis not present

## 2015-11-10 DIAGNOSIS — Z85828 Personal history of other malignant neoplasm of skin: Secondary | ICD-10-CM | POA: Diagnosis not present

## 2015-11-10 DIAGNOSIS — L821 Other seborrheic keratosis: Secondary | ICD-10-CM | POA: Diagnosis not present

## 2015-11-28 ENCOUNTER — Telehealth: Payer: Self-pay | Admitting: Internal Medicine

## 2015-11-28 MED ORDER — OMEPRAZOLE 40 MG PO CPDR: 40.0000 mg | DELAYED_RELEASE_CAPSULE | Freq: Every day | ORAL | Status: DC

## 2015-11-28 NOTE — Telephone Encounter (Signed)
Pt would like her omeprazole (PRILOSEC) 40 MG capsule refilled.

## 2015-11-28 NOTE — Telephone Encounter (Signed)
Prilosec refill sent to pharmacy.

## 2016-02-27 ENCOUNTER — Other Ambulatory Visit (INDEPENDENT_AMBULATORY_CARE_PROVIDER_SITE_OTHER): Payer: PPO

## 2016-02-27 DIAGNOSIS — I10 Essential (primary) hypertension: Secondary | ICD-10-CM | POA: Diagnosis not present

## 2016-02-27 DIAGNOSIS — E119 Type 2 diabetes mellitus without complications: Secondary | ICD-10-CM | POA: Diagnosis not present

## 2016-02-27 DIAGNOSIS — E039 Hypothyroidism, unspecified: Secondary | ICD-10-CM | POA: Diagnosis not present

## 2016-02-27 DIAGNOSIS — E7800 Pure hypercholesterolemia, unspecified: Secondary | ICD-10-CM | POA: Diagnosis not present

## 2016-02-27 LAB — LIPID PANEL
CHOL/HDL RATIO: 3
Cholesterol: 172 mg/dL (ref 0–200)
HDL: 59.4 mg/dL (ref >39.00)
LDL CALC: 89 mg/dL (ref 0–99)
NonHDL: 112.47
TRIGLYCERIDES: 118 mg/dL (ref 0.0–149.0)
VLDL: 23.6 mg/dL (ref 0.0–40.0)

## 2016-02-27 LAB — BASIC METABOLIC PANEL
BUN: 10 mg/dL (ref 6–23)
CO2: 28 mEq/L (ref 19–32)
Calcium: 9.2 mg/dL (ref 8.4–10.5)
Chloride: 94 mEq/L — ABNORMAL LOW (ref 96–112)
Creatinine, Ser: 0.67 mg/dL (ref 0.40–1.20)
GFR: 92.24 mL/min (ref >60.00)
Glucose, Bld: 139 mg/dL — ABNORMAL HIGH (ref 70–99)
POTASSIUM: 4.4 meq/L (ref 3.5–5.1)
SODIUM: 133 meq/L — AB (ref 135–145)

## 2016-02-27 LAB — HEPATIC FUNCTION PANEL
ALBUMIN: 4.5 g/dL (ref 3.5–5.2)
ALK PHOS: 55 U/L (ref 39–117)
ALT: 31 U/L (ref 0–35)
AST: 26 U/L (ref 0–37)
Bilirubin, Direct: 0.1 mg/dL (ref 0.0–0.3)
TOTAL PROTEIN: 7.5 g/dL (ref 6.0–8.3)
Total Bilirubin: 0.5 mg/dL (ref 0.2–1.2)

## 2016-02-27 LAB — HEMOGLOBIN A1C: HEMOGLOBIN A1C: 7.4 % — AB (ref 4.6–6.5)

## 2016-02-27 LAB — TSH: TSH: 1.96 u[IU]/mL (ref 0.35–4.50)

## 2016-02-28 ENCOUNTER — Encounter: Payer: Self-pay | Admitting: Internal Medicine

## 2016-03-01 ENCOUNTER — Encounter: Payer: Self-pay | Admitting: Internal Medicine

## 2016-03-01 ENCOUNTER — Ambulatory Visit (INDEPENDENT_AMBULATORY_CARE_PROVIDER_SITE_OTHER): Payer: PPO | Admitting: Internal Medicine

## 2016-03-01 DIAGNOSIS — I10 Essential (primary) hypertension: Secondary | ICD-10-CM | POA: Diagnosis not present

## 2016-03-01 DIAGNOSIS — E7800 Pure hypercholesterolemia, unspecified: Secondary | ICD-10-CM

## 2016-03-01 DIAGNOSIS — E871 Hypo-osmolality and hyponatremia: Secondary | ICD-10-CM

## 2016-03-01 DIAGNOSIS — E039 Hypothyroidism, unspecified: Secondary | ICD-10-CM

## 2016-03-01 DIAGNOSIS — Z23 Encounter for immunization: Secondary | ICD-10-CM | POA: Diagnosis not present

## 2016-03-01 DIAGNOSIS — K219 Gastro-esophageal reflux disease without esophagitis: Secondary | ICD-10-CM

## 2016-03-01 DIAGNOSIS — E119 Type 2 diabetes mellitus without complications: Secondary | ICD-10-CM

## 2016-03-01 LAB — HM DIABETES EYE EXAM

## 2016-03-01 NOTE — Assessment & Plan Note (Signed)
Blood pressure under good control.  Continue same medication regimen.  Follow pressures.  Follow metabolic panel.   

## 2016-03-01 NOTE — Assessment & Plan Note (Signed)
Controlled on omeprazole.   

## 2016-03-01 NOTE — Assessment & Plan Note (Addendum)
a1c just checked - stable - 7.4.  She has started exercising.  Discussed diet and exercise.  Hold on making changes.  Eye exam scheduled for this pm.  Follow.

## 2016-03-01 NOTE — Progress Notes (Signed)
Pre-visit discussion using our clinic review tool. No additional management support is needed unless otherwise documented below in the visit note.  

## 2016-03-01 NOTE — Progress Notes (Signed)
Patient ID: Michelle Hawkins, female   DOB: 1946/06/18, 70 y.o.   MRN: [id number]   Subjective:    Patient ID: Michelle Hawkins, female    DOB: Jul 26, 1945, 70 y.o.   MRN: [id number]  HPI  Patient here for a scheduled follow up.  She feels good.  Has started back at the [facility] - exercising.  Feels better since starting to exercise.  No chest pain.  No sob.  No acid reflux.  No abdominal pain or cramping.  Bowels stable.  Doing well on probiotics.  Blood sugars in am averaging 120-140.  Not watching her diet as well.  Discussed her labs.  Discussed stable a1c 7.4.     Past Medical History:  Diagnosis Date  . Allergic rhinitis   . Breast cancer ([place]) 1996   s/p left breast lumpectomy (lymph node dissection - 2/11 positive), chemotherapy  . Breast cancer ([place]) 1995   right breast lumpectomy  . Diabetes mellitus   . Dysphagia   . GERD (gastroesophageal reflux disease)   . H/O ulcer disease    PUD  . Hypercholesterolemia   . Hypothyroidism    multinodular goiter  . Nephrolithiasis   . Scoliosis   . Skin cancer   . SVT (supraventricular tachycardia) ([facility])    Past Surgical History:  Procedure Laterality Date  . ABDOMINAL HYSTERECTOMY     partial  . BREAST BIOPSY Left 09/2012   benign  . BREAST LUMPECTOMY WITH AXILLARY LYMPH NODE DISSECTION  1996   left  . LUMBAR LAMINECTOMY  3/07   L5-S1   Family History  Problem Relation Age of Onset  . Heart disease Father   . Hypertension Father   . Diabetes Father    Social History   Social History  . Marital status: Married    Spouse name: N/A  . Number of children: N/A  . Years of education: N/A   Social History Main Topics  . Smoking status: Former Smoker    Packs/day: 0.50    Years: 40.00    Types: Cigarettes    Quit date: 04/14/2004  . Smokeless tobacco: Never Used  . Alcohol use No  . Drug use: No  . Sexual activity: No   Other Topics Concern  . None   Social History Narrative  . None    Outpatient Encounter Prescriptions  as of 03/01/2016  Medication Sig  . ACCU-CHEK SOFTCLIX LANCETS lancets Check blood sugar twice daily Dx 250.00  . aspirin 81 MG tablet Take 81 mg by mouth 2 (two) times daily.   Marland Kitchen atorvastatin (LIPITOR) 20 MG tablet Take 1 tablet (20 mg total) by mouth daily.  . Biotin (BIOTIN 5000) 5 MG CAPS Take by mouth.  . Blood Glucose Monitoring Suppl (ONE TOUCH ULTRA SYSTEM KIT) w/Device KIT 1 kit by Does not apply route once.  . Blood Glucose Monitoring Suppl (ONE TOUCH ULTRA SYSTEM KIT) w/Device KIT 1 kit by Does not apply route once.  . calcium carbonate 200 MG capsule Take 250 mg by mouth 2 (two) times daily with a meal.  . cholecalciferol (VITAMIN D) 400 UNITS TABS Take by mouth.  . escitalopram (LEXAPRO) 20 MG tablet Take 1 tablet (20 mg total) by mouth daily.  . fish oil-omega-3 fatty acids 1000 MG capsule Take 2 g by mouth daily.  Marland Kitchen glucose blood (ACCU-CHEK AVIVA PLUS) test strip TEST BLOOD SUGAR TWICE DAILY  . IRON PO Take by mouth.  . levothyroxine (SYNTHROID, LEVOTHROID) 25 MCG tablet Take 1 tablet (  25 mcg total) by mouth daily.  Marland Kitchen losartan (COZAAR) 100 MG tablet Take 1 tablet (100 mg total) by mouth daily.  . metFORMIN (GLUCOPHAGE) 500 MG tablet Take 2 tablets (1,000 mg total) by mouth 2 (two) times daily.  . metoprolol succinate (TOPROL-XL) 50 MG 24 hr tablet TAKE 1 TABLET  DAILY. TAKE WITH OR IMMEDIATELY FOLLOWING A MEAL.  . Multiple Vitamin (MULTIVITAMIN WITH MINERALS) TABS tablet Take 1 tablet by mouth daily.  Marland Kitchen omeprazole (PRILOSEC) 40 MG capsule Take 1 capsule (40 mg total) by mouth daily.  . Probiotic Product (PROBIOTIC DAILY PO) Take 1 tablet by mouth.  . TURMERIC PO Take by mouth daily.    No facility-administered encounter medications on file as of 03/01/2016.     Review of Systems  Constitutional: Negative for appetite change and unexpected weight change.  HENT: Negative for congestion and sinus pressure.   Respiratory: Negative for cough, chest tightness and shortness of  breath.   Cardiovascular: Negative for chest pain, palpitations and leg swelling.  Gastrointestinal: Negative for abdominal pain, diarrhea, nausea and vomiting.  Genitourinary: Negative for difficulty urinating and dysuria.  Musculoskeletal: Negative for joint swelling and myalgias.  Skin: Negative for color change and rash.  Neurological: Negative for dizziness, light-headedness and headaches.  Psychiatric/Behavioral: Negative for agitation and dysphoric mood.       Objective:    Physical Exam  Constitutional: She appears well-developed and well-nourished. No distress.  HENT:  Nose: Nose normal.  Mouth/Throat: Oropharynx is clear and moist.  Neck: Neck supple. No thyromegaly present.  Cardiovascular: Normal rate and regular rhythm.   Pulmonary/Chest: Breath sounds normal. No respiratory distress. She has no wheezes.  Abdominal: Soft. Bowel sounds are normal. There is no tenderness.  Musculoskeletal: She exhibits no edema or tenderness.  Lymphadenopathy:    She has no cervical adenopathy.  Skin: No rash noted. No erythema.  Psychiatric: She has a normal mood and affect. Her behavior is normal.    BP 120/70   Pulse 66   Temp 97.8 F (36.6 C) (Oral)   Resp 18   Ht 5' 3.75" (1.619 m)   Wt 148 lb 8 oz (67.4 kg)   SpO2 95%   BMI 25.69 kg/m  Wt Readings from Last 3 Encounters:  03/01/16 148 lb 8 oz (67.4 kg)  10/26/15 148 lb (67.1 kg)  09/21/15 148 lb (67.1 kg)     Lab Results  Component Value Date   WBC 8.6 10/24/2015   HGB 13.1 10/24/2015   HCT 39.2 10/24/2015   PLT 314.0 10/24/2015   GLUCOSE 139 (H) 02/27/2016   CHOL 172 02/27/2016   TRIG 118.0 02/27/2016   HDL 59.40 02/27/2016   LDLCALC 89 02/27/2016   ALT 31 02/27/2016   AST 26 02/27/2016   NA 133 (L) 02/27/2016   K 4.4 02/27/2016   CL 94 (L) 02/27/2016   CREATININE 0.67 02/27/2016   BUN 10 02/27/2016   CO2 28 02/27/2016   TSH 1.96 02/27/2016   HGBA1C 7.4 (H) 02/27/2016   MICROALBUR 1.9 10/24/2015     Mm Screening Breast Tomo Bilateral  Result Date: 09/22/2015 CLINICAL DATA:  Screening. History of bilateral breast cancers and lumpectomies in the 90s. EXAM: DIGITAL SCREENING BILATERAL MAMMOGRAM WITH 3D TOMO WITH CAD COMPARISON:  Previous exam(s). ACR Breast Density Category b: There are scattered areas of fibroglandular density. FINDINGS: There are no findings suspicious for malignancy. Bilateral breast scarring again noted. Images were processed with CAD. IMPRESSION: No mammographic evidence of malignancy. A  result letter of this screening mammogram will be mailed directly to the patient. RECOMMENDATION: Screening mammogram in one year. (Code:SM-B-01Y) BI-RADS CATEGORY  2: Benign. Electronically Signed   By: Margarette Canada M.D.   On: 09/22/2015 13:46       Assessment & Plan:   Problem List Items Addressed This Visit    Diabetes mellitus ([place])    a1c just checked - stable - 7.4.  She has started exercising.  Discussed diet and exercise.  Hold on making changes.  Eye exam scheduled for this pm.  Follow.       Relevant Orders   Hemoglobin A1c   GERD (gastroesophageal reflux disease)    Controlled on omeprazole.       Hypercholesterolemia    On lipitor.  Low cholesterol diet and exercise.  Follow lipid panel and liver function tests.  Recent cholesterol - LDL 89.        Relevant Orders   Lipid panel   Hepatic function panel   Hypertension    Blood pressure under good control.  Continue same medication regimen.  Follow pressures.  Follow metabolic panel.        Relevant Orders   Basic metabolic panel   Hyponatremia    Sodium just checked 133 - stable.       Hypothyroidism    On thyroid replacement.  Follow tsh.        Other Visit Diagnoses   None.      Einar Pheasant, MD

## 2016-03-01 NOTE — Assessment & Plan Note (Signed)
On lipitor.  Low cholesterol diet and exercise.  Follow lipid panel and liver function tests.  Recent cholesterol - LDL 89.

## 2016-03-01 NOTE — Assessment & Plan Note (Signed)
Sodium just checked 133 - stable.

## 2016-03-01 NOTE — Assessment & Plan Note (Signed)
On thyroid replacement.  Follow tsh.  

## 2016-03-02 ENCOUNTER — Encounter: Payer: Self-pay | Admitting: Internal Medicine

## 2016-03-16 ENCOUNTER — Other Ambulatory Visit: Payer: Self-pay | Admitting: Internal Medicine

## 2016-04-11 ENCOUNTER — Other Ambulatory Visit: Payer: Self-pay | Admitting: Internal Medicine

## 2016-06-29 ENCOUNTER — Other Ambulatory Visit (INDEPENDENT_AMBULATORY_CARE_PROVIDER_SITE_OTHER): Payer: PPO

## 2016-06-29 DIAGNOSIS — E7800 Pure hypercholesterolemia, unspecified: Secondary | ICD-10-CM | POA: Diagnosis not present

## 2016-06-29 DIAGNOSIS — I10 Essential (primary) hypertension: Secondary | ICD-10-CM

## 2016-06-29 DIAGNOSIS — E119 Type 2 diabetes mellitus without complications: Secondary | ICD-10-CM | POA: Diagnosis not present

## 2016-06-29 LAB — BASIC METABOLIC PANEL
BUN: 15 mg/dL (ref 6–23)
CALCIUM: 10.6 mg/dL — AB (ref 8.4–10.5)
CO2: 30 meq/L (ref 19–32)
CREATININE: 0.67 mg/dL (ref 0.40–1.20)
Chloride: 98 mEq/L (ref 96–112)
GFR: 92.15 mL/min (ref >60.00)
GLUCOSE: 138 mg/dL — AB (ref 70–99)
Potassium: 5 mEq/L (ref 3.5–5.1)
SODIUM: 135 meq/L (ref 135–145)

## 2016-06-29 LAB — HEPATIC FUNCTION PANEL
ALBUMIN: 4.5 g/dL (ref 3.5–5.2)
ALK PHOS: 78 U/L (ref 39–117)
ALT: 33 U/L (ref 0–35)
AST: 29 U/L (ref 0–37)
BILIRUBIN DIRECT: 0.1 mg/dL (ref 0.0–0.3)
BILIRUBIN TOTAL: 0.4 mg/dL (ref 0.2–1.2)
Total Protein: 7.3 g/dL (ref 6.0–8.3)

## 2016-06-29 LAB — LIPID PANEL
CHOL/HDL RATIO: 3
Cholesterol: 176 mg/dL (ref 0–200)
HDL: 56.2 mg/dL (ref >39.00)
LDL Cholesterol: 85 mg/dL (ref 0–99)
NONHDL: 120.17
Triglycerides: 176 mg/dL — ABNORMAL HIGH (ref 0.0–149.0)
VLDL: 35.2 mg/dL (ref 0.0–40.0)

## 2016-06-29 LAB — HEMOGLOBIN A1C: Hgb A1c MFr Bld: 7.2 % — ABNORMAL HIGH (ref 4.6–6.5)

## 2016-07-03 ENCOUNTER — Encounter: Payer: Self-pay | Admitting: Internal Medicine

## 2016-07-06 ENCOUNTER — Ambulatory Visit (INDEPENDENT_AMBULATORY_CARE_PROVIDER_SITE_OTHER): Payer: PPO | Admitting: Internal Medicine

## 2016-07-06 ENCOUNTER — Encounter: Payer: Self-pay | Admitting: Internal Medicine

## 2016-07-06 DIAGNOSIS — I10 Essential (primary) hypertension: Secondary | ICD-10-CM | POA: Diagnosis not present

## 2016-07-06 DIAGNOSIS — C50919 Malignant neoplasm of unspecified site of unspecified female breast: Secondary | ICD-10-CM

## 2016-07-06 DIAGNOSIS — E7800 Pure hypercholesterolemia, unspecified: Secondary | ICD-10-CM | POA: Diagnosis not present

## 2016-07-06 DIAGNOSIS — E119 Type 2 diabetes mellitus without complications: Secondary | ICD-10-CM

## 2016-07-06 DIAGNOSIS — E039 Hypothyroidism, unspecified: Secondary | ICD-10-CM | POA: Diagnosis not present

## 2016-07-06 NOTE — Progress Notes (Signed)
Pre visit review using our clinic review tool, if applicable. No additional management support is needed unless otherwise documented below in the visit note. 

## 2016-07-06 NOTE — Progress Notes (Signed)
Patient ID: Michelle Hawkins, female   DOB: [birth year], 70 y.o.   MRN: [id number]   Subjective:    Patient ID: Michelle Hawkins, female    DOB: [birth year], 70 y.o.   MRN: [id number]  HPI  Patient with past history of hypercholesterolemia, diabetes and breast cancer.  She comes in today to follow up on these issues as well as for a complete physical exam.  She reports she is doing relatively well.  Discussed her lab results.  a1c slightly improved 7.2.  LDL 85.  Discussed diet and exercise.  Tries to stay active.  No chest pain.  No sob.  No acid reflux.  No abdominal pain or cramping.  Bowels stable.     Past Medical History:  Diagnosis Date  . Allergic rhinitis   . Breast cancer ([place]) 1996   s/p left breast lumpectomy (lymph node dissection - 2/11 positive), chemotherapy  . Breast cancer ([place]) 1995   right breast lumpectomy  . Diabetes mellitus   . Dysphagia   . GERD (gastroesophageal reflux disease)   . H/O ulcer disease    PUD  . Hypercholesterolemia   . Hypothyroidism    multinodular goiter  . Nephrolithiasis   . Scoliosis   . Skin cancer   . SVT (supraventricular tachycardia) ([facility])    Past Surgical History:  Procedure Laterality Date  . ABDOMINAL HYSTERECTOMY     partial  . BREAST BIOPSY Left 09/2012   benign  . BREAST LUMPECTOMY WITH AXILLARY LYMPH NODE DISSECTION  1996   left  . LUMBAR LAMINECTOMY  3/07   L5-S1   Family History  Problem Relation Age of Onset  . Heart disease Father   . Hypertension Father   . Diabetes Father    Social History   Social History  . Marital status: Married    Spouse name: N/A  . Number of children: N/A  . Years of education: N/A   Social History Main Topics  . Smoking status: Former Smoker    Packs/day: 0.50    Years: 40.00    Types: Cigarettes    Quit date: 04/14/2004  . Smokeless tobacco: Never Used  . Alcohol use No  . Drug use: No  . Sexual activity: No   Other Topics Concern  . None   Social History Narrative  . None      Outpatient Encounter Prescriptions as of 07/06/2016  Medication Sig  . ACCU-CHEK SOFTCLIX LANCETS lancets Check blood sugar twice daily Dx 250.00  . aspirin 81 MG tablet Take 81 mg by mouth 2 (two) times daily.   Marland Kitchen atorvastatin (LIPITOR) 20 MG tablet Take 1 tablet (20 mg total) by mouth daily.  . Biotin (BIOTIN 5000) 5 MG CAPS Take by mouth.  . Blood Glucose Monitoring Suppl (ONE TOUCH ULTRA SYSTEM KIT) w/Device KIT 1 kit by Does not apply route once.  . Blood Glucose Monitoring Suppl (ONE TOUCH ULTRA SYSTEM KIT) w/Device KIT 1 kit by Does not apply route once.  . calcium carbonate 200 MG capsule Take 250 mg by mouth 2 (two) times daily with a meal.  . cholecalciferol (VITAMIN D) 400 UNITS TABS Take by mouth.  . escitalopram (LEXAPRO) 20 MG tablet Take 1 tablet (20 mg total) by mouth daily.  . fish oil-omega-3 fatty acids 1000 MG capsule Take 2 g by mouth daily.  . IRON PO Take by mouth.  . levothyroxine (SYNTHROID, LEVOTHROID) 25 MCG tablet Take 1 tablet (25 mcg total) by mouth daily.  Marland Kitchen  losartan (COZAAR) 100 MG tablet Take 1 tablet (100 mg total) by mouth daily.  . metFORMIN (GLUCOPHAGE) 500 MG tablet Take 2 tablets (1,000 mg total) by mouth 2 (two) times daily.  . metoprolol succinate (TOPROL-XL) 50 MG 24 hr tablet TAKE 1 TABLET  DAILY. TAKE WITH OR IMMEDIATELY FOLLOWING A MEAL.  . Multiple Vitamin (MULTIVITAMIN WITH MINERALS) TABS tablet Take 1 tablet by mouth daily.  Marland Kitchen omeprazole (PRILOSEC) 40 MG capsule Take 1 capsule (40 mg total) by mouth daily.  . ONE TOUCH ULTRA TEST test strip TEST BLOOD SUGAR TWICE DAILY  . Probiotic Product (PROBIOTIC DAILY PO) Take 1 tablet by mouth.  . TURMERIC PO Take by mouth daily.   . [DISCONTINUED] atorvastatin (LIPITOR) 20 MG tablet Take 1 tablet (20 mg total) by mouth daily.  . [DISCONTINUED] escitalopram (LEXAPRO) 20 MG tablet Take 1 tablet (20 mg total) by mouth daily.  . [DISCONTINUED] levothyroxine (SYNTHROID, LEVOTHROID) 25 MCG tablet Take 1  tablet (25 mcg total) by mouth daily.  . [DISCONTINUED] losartan (COZAAR) 100 MG tablet Take 1 tablet (100 mg total) by mouth daily.  . [DISCONTINUED] metFORMIN (GLUCOPHAGE) 500 MG tablet Take 2 tablets (1,000 mg total) by mouth 2 (two) times daily.  . [DISCONTINUED] metoprolol succinate (TOPROL-XL) 50 MG 24 hr tablet TAKE 1 TABLET  DAILY. TAKE WITH OR IMMEDIATELY FOLLOWING A MEAL.  . [DISCONTINUED] omeprazole (PRILOSEC) 40 MG capsule TAKE ONE CAPSULE DAILY   No facility-administered encounter medications on file as of 07/06/2016.     Review of Systems  Constitutional: Negative for appetite change and unexpected weight change.  HENT: Negative for congestion and sinus pressure.   Eyes: Negative for pain and visual disturbance.  Respiratory: Negative for cough, chest tightness and shortness of breath.   Cardiovascular: Negative for chest pain, palpitations and leg swelling.  Gastrointestinal: Negative for abdominal pain, diarrhea, nausea and vomiting.  Genitourinary: Negative for difficulty urinating and dysuria.  Musculoskeletal: Negative for back pain and joint swelling.  Skin: Negative for color change and rash.  Neurological: Negative for dizziness, light-headedness and headaches.  Hematological: Negative for adenopathy. Does not bruise/bleed easily.  Psychiatric/Behavioral: Negative for agitation and dysphoric mood.       Objective:     Blood pressure rechecked by me:  132/72  Physical Exam  Constitutional: She is oriented to person, place, and time. She appears well-developed and well-nourished. No distress.  HENT:  Nose: Nose normal.  Mouth/Throat: Oropharynx is clear and moist.  Eyes: Right eye exhibits no discharge. Left eye exhibits no discharge. No scleral icterus.  Neck: Neck supple. No thyromegaly present.  Cardiovascular: Normal rate and regular rhythm.   Pulmonary/Chest: Breath sounds normal. No accessory muscle usage. No tachypnea. No respiratory distress. She has  no decreased breath sounds. She has no wheezes. She has no rhonchi. Right breast exhibits no inverted nipple, no mass, no nipple discharge and no tenderness (no axillary adenopathy). Left breast exhibits no inverted nipple, no mass, no nipple discharge and no tenderness (no axilarry adenopathy).  Abdominal: Soft. Bowel sounds are normal. There is no tenderness.  Musculoskeletal: She exhibits no edema or tenderness.  Lymphadenopathy:    She has no cervical adenopathy.  Neurological: She is alert and oriented to person, place, and time.  Skin: Skin is warm. No rash noted. No erythema.  Psychiatric: She has a normal mood and affect. Her behavior is normal.    BP 124/66   Pulse 62   Temp 98.2 F (36.8 C) (Oral)   Ht  '5\' 4"'  (1.626 m)   Wt 153 lb 9.6 oz (69.7 kg)   SpO2 93%   BMI 26.37 kg/m  Wt Readings from Last 3 Encounters:  07/06/16 153 lb 9.6 oz (69.7 kg)  03/01/16 148 lb 8 oz (67.4 kg)  10/26/15 148 lb (67.1 kg)     Lab Results  Component Value Date   WBC 8.6 10/24/2015   HGB 13.1 10/24/2015   HCT 39.2 10/24/2015   PLT 314.0 10/24/2015   GLUCOSE 138 (H) 06/29/2016   CHOL 176 06/29/2016   TRIG 176.0 (H) 06/29/2016   HDL 56.20 06/29/2016   LDLCALC 85 06/29/2016   ALT 33 06/29/2016   AST 29 06/29/2016   NA 135 06/29/2016   K 5.0 06/29/2016   CL 98 06/29/2016   CREATININE 0.67 06/29/2016   BUN 15 06/29/2016   CO2 30 06/29/2016   TSH 1.96 02/27/2016   HGBA1C 7.2 (H) 06/29/2016   MICROALBUR 1.9 10/24/2015    Mm Screening Breast Tomo Bilateral  Result Date: 09/22/2015 CLINICAL DATA:  Screening. History of bilateral breast cancers and lumpectomies in the 90s. EXAM: DIGITAL SCREENING BILATERAL MAMMOGRAM WITH 3D TOMO WITH CAD COMPARISON:  Previous exam(s). ACR Breast Density Category b: There are scattered areas of fibroglandular density. FINDINGS: There are no findings suspicious for malignancy. Bilateral breast scarring again noted. Images were processed with CAD.  IMPRESSION: No mammographic evidence of malignancy. A result letter of this screening mammogram will be mailed directly to the patient. RECOMMENDATION: Screening mammogram in one year. (Code:SM-B-01Y) BI-RADS CATEGORY  2: Benign. Electronically Signed   By: Margarette Canada M.D.   On: 09/22/2015 13:46       Assessment & Plan:   Problem List Items Addressed This Visit    Breast cancer ([place])    Mammogram 3/16.17 - [facility] II.        Diabetes mellitus ([place])    Low carb diet and exercise.  Follow met b and a1c.  a1c just checked 7.2.  Improved.  Follow.        Relevant Medications   metFORMIN (GLUCOPHAGE) 500 MG tablet   losartan (COZAAR) 100 MG tablet   atorvastatin (LIPITOR) 20 MG tablet   Hypercholesterolemia    On lipitor.  Low cholesterol diet and exercise.  Follow lipid panel and liver function tests.        Relevant Medications   metoprolol succinate (TOPROL-XL) 50 MG 24 hr tablet   losartan (COZAAR) 100 MG tablet   atorvastatin (LIPITOR) 20 MG tablet   Hypertension    Blood pressure under good control.  Continue same medication regimen.  Follow pressures.  Follow metabolic panel.        Relevant Medications   metoprolol succinate (TOPROL-XL) 50 MG 24 hr tablet   losartan (COZAAR) 100 MG tablet   atorvastatin (LIPITOR) 20 MG tablet   Hypothyroidism    On thyroid replacement.  Follow tsh.        Relevant Medications   metoprolol succinate (TOPROL-XL) 50 MG 24 hr tablet   levothyroxine (SYNTHROID, LEVOTHROID) 25 MCG tablet    Other Visit Diagnoses    Hypercalcemia    -  Primary   recheck calcium.  slightly elevated on last check.    Relevant Orders   Calcium       Einar Pheasant, MD

## 2016-07-09 ENCOUNTER — Encounter: Payer: Self-pay | Admitting: Internal Medicine

## 2016-07-09 MED ORDER — METOPROLOL SUCCINATE ER 50 MG PO TB24: ORAL_TABLET | ORAL | 3 refills | Status: DC

## 2016-07-09 MED ORDER — LOSARTAN POTASSIUM 100 MG PO TABS: 100.0000 mg | ORAL_TABLET | Freq: Every day | ORAL | 3 refills | Status: DC

## 2016-07-09 MED ORDER — METFORMIN HCL 500 MG PO TABS: 1000.0000 mg | ORAL_TABLET | Freq: Two times a day (BID) | ORAL | 3 refills | Status: DC

## 2016-07-09 MED ORDER — ESCITALOPRAM OXALATE 20 MG PO TABS: 20.0000 mg | ORAL_TABLET | Freq: Every day | ORAL | 1 refills | Status: DC

## 2016-07-09 MED ORDER — ATORVASTATIN CALCIUM 20 MG PO TABS: 20.0000 mg | ORAL_TABLET | Freq: Every day | ORAL | 3 refills | Status: DC

## 2016-07-09 MED ORDER — LEVOTHYROXINE SODIUM 25 MCG PO TABS: 25.0000 ug | ORAL_TABLET | Freq: Every day | ORAL | 3 refills | Status: DC

## 2016-07-09 MED ORDER — OMEPRAZOLE 40 MG PO CPDR: 40.0000 mg | DELAYED_RELEASE_CAPSULE | Freq: Every day | ORAL | 1 refills | Status: DC

## 2016-07-09 NOTE — Assessment & Plan Note (Signed)
Mammogram 3/16.17 - [facility] II.

## 2016-07-09 NOTE — Assessment & Plan Note (Signed)
Low carb diet and exercise.  Follow met b and a1c.  a1c just checked 7.2.  Improved.  Follow.

## 2016-07-09 NOTE — Assessment & Plan Note (Signed)
Blood pressure under good control.  Continue same medication regimen.  Follow pressures.  Follow metabolic panel.   

## 2016-07-09 NOTE — Assessment & Plan Note (Signed)
On lipitor.  Low cholesterol diet and exercise.  Follow lipid panel and liver function tests.   

## 2016-07-09 NOTE — Assessment & Plan Note (Signed)
On thyroid replacement.  Follow tsh.  

## 2016-07-13 ENCOUNTER — Other Ambulatory Visit: Payer: Self-pay | Admitting: Internal Medicine

## 2016-07-27 ENCOUNTER — Other Ambulatory Visit: Payer: PPO

## 2016-09-20 ENCOUNTER — Ambulatory Visit (INDEPENDENT_AMBULATORY_CARE_PROVIDER_SITE_OTHER): Payer: PPO

## 2016-09-20 VITALS — BP 122/62 | HR 60 | Temp 98.2°F | Resp 14 | Ht 64.5 in | Wt 152.8 lb

## 2016-09-20 DIAGNOSIS — Z0000 Encounter for general adult medical examination without abnormal findings: Secondary | ICD-10-CM

## 2016-09-20 NOTE — Progress Notes (Signed)
Subjective:   Michelle Hawkins is a 71 y.o. female who presents for Medicare Annual (Subsequent) preventive examination.  Review of Systems:  No ROS.  Medicare Wellness Visit.  Cardiac Risk Factors include: advanced age (>10mn, >>2women);hypertension;diabetes mellitus     Objective:     Vitals: BP 122/62 (BP Location: Right Arm, Patient Position: Sitting, Cuff Size: Normal)   Pulse 60   Temp 98.2 F (36.8 C) (Oral)   Resp 14   Ht 5' 4.5" (1.638 m)   Wt 152 lb 12.8 oz (69.3 kg)   SpO2 95%   BMI 25.82 kg/m   Body mass index is 25.82 kg/m.   Tobacco History  Smoking Status  . Former Smoker  . Packs/day: 0.50  . Years: 40.00  . Types: Cigarettes  . Quit date: 04/14/2004  Smokeless Tobacco  . Never Used     Counseling given: Not Answered   Past Medical History:  Diagnosis Date  . Allergic rhinitis   . [place]   s/p left breast lumpectomy (lymph node dissection - 2/11 positive), chemotherapy  . [place]   right breast lumpect[place]  . Diabetes mellitus   . Dysphagia   . GERD (gastroesophageal reflux disease)   . H/O ulcer disease    PUD  . Hypercholesterolemia   . Hypothyroidism    multinodular goiter  . Nephrolithiasis   . Scoliosis   . Skin cancer   . SVT (supraventricular tachycardia) (HCC)    Past Surgical History:  Procedure Laterality Date  . ABDOMINAL HYSTERECTOMY     partial  . BREAST BIOPSY Left 09/2012   benign  . BREAST LUMPECTOMY WITH AXILLARY LYMPH NODE DISSECTION  1996   left  . LUMBAR LAMINECTOMY  3/07   L5-S1   Family History  Problem Relation Age of Onset  . Heart disease Father   . Hypertension Father   . Diabetes Father   . Alzheimer's disease Mother   . Diabetes Sister   . Diabetes Sister   . Diabetes Sister   . Diabetes Sister    History  Sexual Activity  . Sexual activity: No    Outpatient Encounter Prescriptions as of 09/20/2016  Medication Sig  . ACCU-CHEK SOFTCLIX LANCETS lancets  Check blood sugar twice daily Dx 250.00  . aspirin 81 MG tablet Take 81 mg by mouth 2 (two) times daily.   .Marland Kitchenatorvastatin (LIPITOR) 20 MG tablet Take 1 tablet (20 mg total) by mouth daily.  . Biotin (BIOTIN 5000) 5 MG CAPS Take by mouth.  . Blood Glucose Monitoring Suppl (ONE TOUCH ULTRA SYSTEM KIT) w/Device KIT 1 kit by Does not apply route once.  . Blood Glucose Monitoring Suppl (ONE TOUCH ULTRA SYSTEM KIT) w/Device KIT 1 kit by Does not apply route once.  . calcium carbonate 200 MG capsule Take 250 mg by mouth 2 (two) times daily with a meal.  . cholecalciferol (VITAMIN D) 400 UNITS TABS Take by mouth.  . escitalopram (LEXAPRO) 20 MG tablet Take 1 tablet (20 mg total) by mouth daily.  . fish oil-omega-3 fatty acids 1000 MG capsule Take 2 g by mouth daily.  .Marland Kitchenlevothyroxine (SYNTHROID, LEVOTHROID) 25 MCG tablet Take 1 tablet (25 mcg total) by mouth daily.  .Marland Kitchenlevothyroxine (SYNTHROID, LEVOTHROID) 25 MCG tablet TAKE 1 TABLET BY MOUTH DAILY  . losartan (COZAAR) 100 MG tablet Take 1 tablet (100 mg total) by mouth daily.  . metFORMIN (GLUCOPHAGE) 500 MG tablet Take 2 tablets (1,000 mg  total) by mouth 2 (two) times daily.  . metoprolol succinate (TOPROL-XL) 50 MG 24 hr tablet TAKE 1 TABLET  DAILY. TAKE WITH OR IMMEDIATELY FOLLOWING A MEAL.  . Multiple Vitamin (MULTIVITAMIN WITH MINERALS) TABS tablet Take 1 tablet by mouth daily.  Marland Kitchen omeprazole (PRILOSEC) 40 MG capsule Take 1 capsule (40 mg total) by mouth daily.  . ONE TOUCH ULTRA TEST test strip TEST BLOOD SUGAR TWICE DAILY  . Probiotic Product (PROBIOTIC DAILY PO) Take 1 tablet by mouth.  . TURMERIC PO Take by mouth daily.   . [DISCONTINUED] IRON PO Take by mouth.   No facility-administered encounter medications on file as of 09/20/2016.     Activities of Daily Living In your present state of health, do you have any difficulty performing the following activities: 09/20/2016 09/21/2015  Hearing? N N  Vision? N N  Difficulty concentrating or  making decisions? N N  Walking or climbing stairs? N Y  Dressing or bathing? N N  Doing errands, shopping? N N  Preparing Food and eating ? N N  Using the Toilet? N N  In the past six months, have you accidently leaked urine? N N  Do you have problems with loss of bowel control? N N  Managing your Medications? N N  Managing your Finances? N N  Housekeeping or managing your Housekeeping? N N  Some recent data might be hidden    Patient Care Team: Einar Pheasant, MD as PCP - General (Internal Medicine)    Assessment:    This is a routine wellness examination for Michelle Hawkins. The goal of the wellness visit is to assist the patient how to close the gaps in care and create a preventative care plan for the patient.   Taking calcium VIT D as appropriate/Osteoporosis risk reviewed.  Medications reviewed; taking without issues or barriers.  Safety issues reviewed; smoke detectors in the home. Firearms locked up in the home. Wears seatbelts when driving or riding with others. Patient does wear sunscreen or protective clothing when in direct sunlight. No violence in the home.  Patient is alert, normal appearance, oriented to person/place/and time. Correctly identified the president of the [place], recall of 3/3 words, and performing simple calculations.  Patient displays appropriate judgement and can read correct time from watch face.  No new identified risk were noted.  No failures at ADL's or IADL's.   BMI- discussed the importance of a healthy diet, water intake and exercise. Educational material provided.   Diet: Breakfast: Waffle, bacon Lunch: Salad, vegetable snack Dinner: Meat, vegetable Daily fluid intake: 2 cups of caffeine,  4 cups of water  HTN- followed by PCP.  Dental- every six months. Dr. Eugenie Birks.  Eye- Visual acuity not assessed per patient preference since they have regular follow up with the ophthalmologist.  Wears corrective lenses.  Sleep patterns- Sleeps 7-8  hours at night.  Wakes feeling rested.  Patient Concerns: None at this time. Follow up with PCP as needed.  Exercise Activities and Dietary recommendations Current Exercise Habits: The patient does not participate in regular exercise at present  Goals    . Increase lean proteins          Low carb foods.  Lean meats, fruits and vegetables.    . Increase physical activity          Aerobic class 3 times weekly    . Increase water intake          Stay hydrated!  Drink plenty of fluids.  Fall Risk Fall Risk  09/20/2016 09/21/2015 01/19/2015 01/01/2014 10/06/2012  Falls in the past year? Yes Yes No Yes No  Number falls in past yr: 2 or more 1 - 1 -  Injury with Fall? No No - No -  Follow up Falls prevention discussed;Education provided Education provided;Falls prevention discussed - - -   Depression Screen PHQ 2/9 Scores 09/20/2016 09/21/2015 01/19/2015 01/01/2014  PHQ - 2 Score 0 0 0 0     Cognitive Function MMSE - Mini Mental State Exam 09/20/2016 09/21/2015  Orientation to time 5 5  Orientation to Place 5 5  Registration 3 3  Attention/ Calculation 5 5  Recall 3 3  Language- name 2 objects 2 2  Language- repeat 1 1  Language- follow 3 step command 3 3  Language- read & follow direction 1 1  Write a sentence 1 1  Copy design 1 1  Total score 30 30        Immunization History  Administered Date(s) Administered  . Influenza Split 04/14/2012  . Influenza,inj,Quad PF,36+ Mos 04/01/2013, 03/31/2014, 05/18/2015, 03/01/2016  . Pneumococcal Conjugate-13 04/06/2014  . Pneumococcal Polysaccharide-23 06/26/2010   Screening Tests Health Maintenance  Topic Date Due  . TETANUS/TDAP  03/29/1964  . FOOT EXAM  10/14/2015  . MAMMOGRAM  09/21/2016  . HEMOGLOBIN A1C  12/28/2016  . OPHTHALMOLOGY EXAM  03/01/2017  . COLONOSCOPY  10/15/2022  . INFLUENZA VACCINE  Completed  . DEXA SCAN  Completed  . Hepatitis C Screening  Completed  . PNA vac Low Risk Adult  Completed        Plan:    End of life planning; Advance aging; Advanced directives discussed. Copy of current HCPOA/Living Will requested.    Medicare Attestation I have personally reviewed: The patient's medical and social history Their use of alcohol, tobacco or illicit drugs Their current medications and supplements The patient's functional ability including ADLs,fall risks, home safety risks, cognitive, and hearing and visual impairment Diet and physical activities Evidence for depression   The patient's weight, height, BMI, and visual acuity have been recorded in the chart.  I have made referrals and provided education to the patient based on review of the above and I have provided the patient with a written personalized care plan for preventive services.    During the course of the visit the patient was educated and counseled about the following appropriate screening and preventive services:   Vaccines to include Pneumoccal, Influenza, Hepatitis B, Td, Zostavax, HCV  Colorectal cancer screening-UTD  Bone density screening-UTD  Diabetes screening-followed by PCP  Glaucoma screening-annual eye exam  Mammography-due   Nutrition counseling   Patient Instructions (the written plan) was given to the patient.   Varney Biles, LPN  3/50/7573   Reviewed above information.  Agree with plan.   Dr Nicki Reaper

## 2016-09-20 NOTE — Patient Instructions (Addendum)
Michelle Hawkins , Thank you for taking time to come for your Medicare Wellness Visit. I appreciate your ongoing commitment to your health goals. Please review the following plan we discussed and let me know if I can assist you in the future.   Follow up with Dr. Nicki Reaper as needed.    Bring a copy of your [place] and/or Living Will to be scanned into chart.  Have a great day!  These are the goals we discussed: Goals    . Increase lean proteins          Low carb foods.  Lean meats, fruits and vegetables.    . Increase physical activity          Aerobic class, as tolerated.    . Increase water intake          Stay hydrated!  Drink plenty of fluids.       This is a list of the screening recommended for you and due dates:  Health Maintenance  Topic Date Due  . Tetanus Vaccine  03/29/1964  . Complete foot exam   10/14/2015  . Mammogram  09/21/2016  . Hemoglobin A1C  12/28/2016  . Eye exam for diabetics  03/01/2017  . Colon Cancer Screening  10/15/2022  . Flu Shot  Completed  . DEXA scan (bone density measurement)  Completed  .  Hepatitis C: One time screening is recommended by Center for Disease Control  (CDC) for  adults born from [birth year] through 1966.   Completed  . Pneumonia vaccines  Completed      Fall Prevention in the Home Falls can cause injuries. They can happen to people of all ages. There are many things you can do to make your home safe and to help prevent falls. What can I do on the outside of my home?  Regularly fix the edges of walkways and driveways and fix any cracks.  Remove anything that might make you trip as you walk through a door, such as a raised step or threshold.  Trim any bushes or trees on the path to your home.  Use bright outdoor lighting.  Clear any walking paths of anything that might make someone trip, such as rocks or tools.  Regularly check to see if handrails are loose or broken. Make sure that both sides of any steps  have handrails.  Any raised decks and porches should have guardrails on the edges.  Have any leaves, snow, or ice cleared regularly.  Use sand or salt on walking paths during winter.  Clean up any spills in your garage right away. This includes oil or grease spills. What can I do in the bathroom?  Use night lights.  Install grab bars by the toilet and in the tub and shower. Do not use towel bars as grab bars.  Use non-skid mats or decals in the tub or shower.  If you need to sit down in the shower, use a plastic, non-slip stool.  Keep the floor dry. Clean up any water that spills on the floor as soon as it happens.  Remove soap buildup in the tub or shower regularly.  Attach bath mats securely with double-sided non-slip rug tape.  Do not have throw rugs and other things on the floor that can make you trip. What can I do in the bedroom?  Use night lights.  Make sure that you have a light by your bed that is easy to reach.  Do not use  any sheets or blankets that are too big for your bed. They should not hang down onto the floor.  Have a firm chair that has side arms. You can use this for support while you get dressed.  Do not have throw rugs and other things on the floor that can make you trip. What can I do in the kitchen?  Clean up any spills right away.  Avoid walking on wet floors.  Keep items that you use a lot in easy-to-reach places.  If you need to reach something above you, use a strong step stool that has a grab bar.  Keep electrical cords out of the way.  Do not use floor polish or wax that makes floors slippery. If you must use wax, use non-skid floor wax.  Do not have throw rugs and other things on the floor that can make you trip. What can I do with my stairs?  Do not leave any items on the stairs.  Make sure that there are handrails on both sides of the stairs and use them. Fix handrails that are broken or loose. Make sure that handrails are as  long as the stairways.  Check any carpeting to make sure that it is firmly attached to the stairs. Fix any carpet that is loose or worn.  Avoid having throw rugs at the top or bottom of the stairs. If you do have throw rugs, attach them to the floor with carpet tape.  Make sure that you have a light switch at the top of the stairs and the bottom of the stairs. If you do not have them, ask someone to add them for you. What else can I do to help prevent falls?  Wear shoes that:  Do not have high heels.  Have rubber bottoms.  Are comfortable and fit you well.  Are closed at the toe. Do not wear sandals.  If you use a stepladder:  Make sure that it is fully opened. Do not climb a closed stepladder.  Make sure that both sides of the stepladder are locked into place.  Ask someone to hold it for you, if possible.  Clearly mark and make sure that you can see:  Any grab bars or handrails.  First and last steps.  Where the edge of each step is.  Use tools that help you move around (mobility aids) if they are needed. These include:  Canes.  Walkers.  Scooters.  Crutches.  Turn on the lights when you go into a dark area. Replace any light bulbs as soon as they burn out.  Set up your furniture so you have a clear path. Avoid moving your furniture around.  If any of your floors are uneven, fix them.  If there are any pets around you, be aware of where they are.  Review your medicines with your doctor. Some medicines can make you feel dizzy. This can increase your chance of falling. Ask your doctor what other things that you can do to help prevent falls. This information is not intended to replace advice given to you by your health care provider. Make sure you discuss any questions you have with your health care provider. Document Released: 04/21/2009 Document Revised: 12/01/2015 Document Reviewed: 07/30/2014 Elsevier Interactive Patient Education  2017 [employer].

## 2016-09-21 ENCOUNTER — Ambulatory Visit: Payer: PPO

## 2016-09-21 ENCOUNTER — Ambulatory Visit: Payer: PPO | Admitting: Internal Medicine

## 2016-09-21 ENCOUNTER — Telehealth: Payer: Self-pay | Admitting: Internal Medicine

## 2016-09-21 NOTE — Telephone Encounter (Signed)
Pt scheduled  

## 2016-09-21 NOTE — Telephone Encounter (Signed)
No.  You can take her off the schedule.  If she is having continued cough and congestion, she will need to be seen.

## 2016-09-21 NOTE — Telephone Encounter (Signed)
Do you want to charge no show?

## 2016-09-21 NOTE — Telephone Encounter (Signed)
Pt called and cancelled, stated that spouse has a post surgical appt she foegot about.

## 2016-09-21 NOTE — Telephone Encounter (Signed)
Patient is still having productive cough with green color. She is taking otc meds with no improvement. Dr. Nicki Reaper does not have anything would like to see another provider next week. I was not sure how to see what everyone had open can you call her and make app for me?

## 2016-09-24 ENCOUNTER — Encounter: Payer: Self-pay | Admitting: Family Medicine

## 2016-09-24 ENCOUNTER — Ambulatory Visit (INDEPENDENT_AMBULATORY_CARE_PROVIDER_SITE_OTHER): Payer: PPO | Admitting: Family Medicine

## 2016-09-24 DIAGNOSIS — J209 Acute bronchitis, unspecified: Secondary | ICD-10-CM | POA: Diagnosis not present

## 2016-09-24 NOTE — Progress Notes (Signed)
Pre visit review using our clinic review tool, if applicable. No additional management support is needed unless otherwise documented below in the visit note. 

## 2016-09-24 NOTE — Assessment & Plan Note (Signed)
New acute problem. Doing well at this time. Advised continued use of OTC Robitussin as needed. No indication for antibiotic or imaging.

## 2016-09-24 NOTE — Progress Notes (Signed)
   Subjective:  Patient ID: Michelle Hawkins, female    DOB: 03/25/1946  Age: 71 y.o. MRN: [id number]  CC: Cough  HPI:  71 year old female presents for an acute visit with complaints of cough.  Cough  3 weeks.  Moderate to severe.  Productive of discolored sputum. Now a "light yellow".  She states that her symptoms were worse last week. They have been improving over the weekend.  She's been using Benadryl and Robitussin with improvement.  No shortness of breath.  No fever.  No known exacerbating factors.  No other associated symptoms.  No other complaints or concerns at this time.  Social Hx   Social History   Social History  . Marital status: Married    Spouse name: N/A  . Number of children: N/A  . Years of education: N/A   Social History Main Topics  . Smoking status: Former Smoker    Packs/day: 0.50    Years: 40.00    Types: Cigarettes    Quit date: 04/14/2004  . Smokeless tobacco: Never Used  . Alcohol use No  . Drug use: No  . Sexual activity: No   Other Topics Concern  . None   Social History Narrative  . None    Review of Systems  Constitutional: Negative for fever.  Respiratory: Positive for cough. Negative for shortness of breath.    Objective:  BP 126/62   Pulse 61   Temp 97.8 F (36.6 C) (Oral)   Wt 152 lb 8 oz (69.2 kg)   SpO2 96%   BMI 25.77 kg/m   BP/Weight 09/24/2016 09/20/2016 16/38/4536  Systolic BP 468 032 122  Diastolic BP 62 62 66  Wt. (Lbs) 152.5 152.8 153.6  BMI 25.77 25.82 26.37   Physical Exam  Constitutional: She is oriented to person, place, and time. She appears well-developed. No distress.  HENT:  Mouth/Throat: Oropharynx is clear and moist.  Cardiovascular: Normal rate and regular rhythm.   Pulmonary/Chest: Effort normal and breath sounds normal. She has no wheezes. She has no rales.  Neurological: She is alert and oriented to person, place, and time.  Psychiatric: She has a normal mood and affect.  Vitals  reviewed.   Lab Results  Component Value Date   WBC 8.6 10/24/2015   HGB 13.1 10/24/2015   HCT 39.2 10/24/2015   PLT 314.0 10/24/2015   GLUCOSE 138 (H) 06/29/2016   CHOL 176 06/29/2016   TRIG 176.0 (H) 06/29/2016   HDL 56.20 06/29/2016   LDLCALC 85 06/29/2016   ALT 33 06/29/2016   AST 29 06/29/2016   NA 135 06/29/2016   K 5.0 06/29/2016   CL 98 06/29/2016   CREATININE 0.67 06/29/2016   BUN 15 06/29/2016   CO2 30 06/29/2016   TSH 1.96 02/27/2016   HGBA1C 7.2 (H) 06/29/2016   MICROALBUR 1.9 10/24/2015    Assessment & Plan:   Problem List Items Addressed This Visit    Acute bronchitis    New acute problem. Doing well at this time. Advised continued use of OTC Robitussin as needed. No indication for antibiotic or imaging.        Follow-up: PRN  [place]

## 2016-10-30 ENCOUNTER — Telehealth: Payer: Self-pay | Admitting: *Deleted

## 2016-10-30 ENCOUNTER — Other Ambulatory Visit: Payer: Self-pay | Admitting: Internal Medicine

## 2016-10-30 DIAGNOSIS — E119 Type 2 diabetes mellitus without complications: Secondary | ICD-10-CM

## 2016-10-30 DIAGNOSIS — Z1231 Encounter for screening mammogram for malignant neoplasm of breast: Secondary | ICD-10-CM

## 2016-10-30 DIAGNOSIS — E7800 Pure hypercholesterolemia, unspecified: Secondary | ICD-10-CM

## 2016-10-30 DIAGNOSIS — I10 Essential (primary) hypertension: Secondary | ICD-10-CM

## 2016-10-30 NOTE — Telephone Encounter (Signed)
Opened in error

## 2016-10-30 NOTE — Telephone Encounter (Signed)
Patient has requested to have labs drawn prior to her appt on 11/12/16. Please advise if pt will need scheduling for labs  Pt contact [phone]

## 2016-10-30 NOTE — Telephone Encounter (Signed)
Please advise 

## 2016-10-31 NOTE — Telephone Encounter (Signed)
Labs ordered.  Ok to schedule fasting labs 1-2 days before her 11/12/16 appt.  Please notify pt of appt date and time.  Thanks

## 2016-11-01 NOTE — Telephone Encounter (Signed)
Pt scheduled for labs

## 2016-11-05 ENCOUNTER — Ambulatory Visit: Payer: PPO | Admitting: Internal Medicine

## 2016-11-06 ENCOUNTER — Other Ambulatory Visit (INDEPENDENT_AMBULATORY_CARE_PROVIDER_SITE_OTHER): Payer: PPO

## 2016-11-06 DIAGNOSIS — E119 Type 2 diabetes mellitus without complications: Secondary | ICD-10-CM

## 2016-11-06 DIAGNOSIS — I10 Essential (primary) hypertension: Secondary | ICD-10-CM

## 2016-11-06 DIAGNOSIS — E7800 Pure hypercholesterolemia, unspecified: Secondary | ICD-10-CM | POA: Diagnosis not present

## 2016-11-06 LAB — LIPID PANEL
CHOLESTEROL: 166 mg/dL (ref 0–200)
HDL: 58.6 mg/dL (ref >39.00)
LDL CALC: 86 mg/dL (ref 0–99)
NONHDL: 107.5
Total CHOL/HDL Ratio: 3
Triglycerides: 106 mg/dL (ref 0.0–149.0)
VLDL: 21.2 mg/dL (ref 0.0–40.0)

## 2016-11-06 LAB — MICROALBUMIN / CREATININE URINE RATIO
Creatinine,U: 64.9 mg/dL
MICROALB UR: 0.8 mg/dL (ref 0.0–1.9)
Microalb Creat Ratio: 1.2 mg/g (ref 0.0–30.0)

## 2016-11-06 LAB — CBC WITH DIFFERENTIAL/PLATELET
BASOS PCT: 1 % (ref 0.0–3.0)
Basophils Absolute: 0.1 10*3/uL (ref 0.0–0.1)
Eosinophils Absolute: 0.8 10*3/uL — ABNORMAL HIGH (ref 0.0–0.7)
Eosinophils Relative: 8.9 % — ABNORMAL HIGH (ref 0.0–5.0)
HEMATOCRIT: 41 % (ref 36.0–46.0)
Hemoglobin: 13.5 g/dL (ref 12.0–15.0)
LYMPHS PCT: 33.3 % (ref 12.0–46.0)
Lymphs Abs: 3 10*3/uL (ref 0.7–4.0)
MCHC: 33 g/dL (ref 30.0–36.0)
MCV: 91.3 fl (ref 78.0–100.0)
MONOS PCT: 9.7 % (ref 3.0–12.0)
Monocytes Absolute: 0.9 10*3/uL (ref 0.1–1.0)
NEUTROS ABS: 4.3 10*3/uL (ref 1.4–7.7)
Neutrophils Relative %: 47.1 % (ref 43.0–77.0)
PLATELETS: 350 10*3/uL (ref 150.0–400.0)
RBC: 4.49 Mil/uL (ref 3.87–5.11)
RDW: 13.4 % (ref 11.5–15.5)
WBC: 9.2 10*3/uL (ref 4.0–10.5)

## 2016-11-06 LAB — BASIC METABOLIC PANEL
BUN: 13 mg/dL (ref 6–23)
CO2: 33 mEq/L — ABNORMAL HIGH (ref 19–32)
CREATININE: 0.67 mg/dL (ref 0.40–1.20)
Calcium: 10.1 mg/dL (ref 8.4–10.5)
Chloride: 96 mEq/L (ref 96–112)
GFR: 92.06 mL/min (ref >60.00)
Glucose, Bld: 142 mg/dL — ABNORMAL HIGH (ref 70–99)
Potassium: 4.5 mEq/L (ref 3.5–5.1)
Sodium: 133 mEq/L — ABNORMAL LOW (ref 135–145)

## 2016-11-06 LAB — HEMOGLOBIN A1C: HEMOGLOBIN A1C: 7.5 % — AB (ref 4.6–6.5)

## 2016-11-06 LAB — HEPATIC FUNCTION PANEL
ALT: 31 U/L (ref 0–35)
AST: 25 U/L (ref 0–37)
Albumin: 4.2 g/dL (ref 3.5–5.2)
Alkaline Phosphatase: 70 U/L (ref 39–117)
BILIRUBIN DIRECT: 0.1 mg/dL (ref 0.0–0.3)
TOTAL PROTEIN: 7.3 g/dL (ref 6.0–8.3)
Total Bilirubin: 0.4 mg/dL (ref 0.2–1.2)

## 2016-11-07 ENCOUNTER — Encounter: Payer: Self-pay | Admitting: Internal Medicine

## 2016-11-09 DIAGNOSIS — Z85828 Personal history of other malignant neoplasm of skin: Secondary | ICD-10-CM | POA: Diagnosis not present

## 2016-11-09 DIAGNOSIS — L821 Other seborrheic keratosis: Secondary | ICD-10-CM | POA: Diagnosis not present

## 2016-11-09 DIAGNOSIS — Z08 Encounter for follow-up examination after completed treatment for malignant neoplasm: Secondary | ICD-10-CM | POA: Diagnosis not present

## 2016-11-09 NOTE — Telephone Encounter (Signed)
Hard copy mailed  

## 2016-11-12 ENCOUNTER — Ambulatory Visit: Payer: PPO | Admitting: Internal Medicine

## 2016-11-14 ENCOUNTER — Ambulatory Visit (INDEPENDENT_AMBULATORY_CARE_PROVIDER_SITE_OTHER): Payer: PPO

## 2016-11-14 ENCOUNTER — Encounter: Payer: Self-pay | Admitting: Internal Medicine

## 2016-11-14 ENCOUNTER — Ambulatory Visit (INDEPENDENT_AMBULATORY_CARE_PROVIDER_SITE_OTHER): Payer: PPO | Admitting: Internal Medicine

## 2016-11-14 VITALS — BP 132/70 | HR 56 | Temp 97.9°F | Wt 153.2 lb

## 2016-11-14 DIAGNOSIS — M542 Cervicalgia: Secondary | ICD-10-CM | POA: Diagnosis not present

## 2016-11-14 DIAGNOSIS — E7800 Pure hypercholesterolemia, unspecified: Secondary | ICD-10-CM

## 2016-11-14 DIAGNOSIS — I10 Essential (primary) hypertension: Secondary | ICD-10-CM | POA: Diagnosis not present

## 2016-11-14 DIAGNOSIS — Z853 Personal history of malignant neoplasm of breast: Secondary | ICD-10-CM

## 2016-11-14 DIAGNOSIS — M50321 Other cervical disc degeneration at C4-C5 level: Secondary | ICD-10-CM | POA: Diagnosis not present

## 2016-11-14 DIAGNOSIS — K219 Gastro-esophageal reflux disease without esophagitis: Secondary | ICD-10-CM | POA: Diagnosis not present

## 2016-11-14 DIAGNOSIS — E039 Hypothyroidism, unspecified: Secondary | ICD-10-CM | POA: Diagnosis not present

## 2016-11-14 DIAGNOSIS — E871 Hypo-osmolality and hyponatremia: Secondary | ICD-10-CM

## 2016-11-14 DIAGNOSIS — D473 Essential (hemorrhagic) thrombocythemia: Secondary | ICD-10-CM | POA: Diagnosis not present

## 2016-11-14 DIAGNOSIS — E119 Type 2 diabetes mellitus without complications: Secondary | ICD-10-CM | POA: Diagnosis not present

## 2016-11-14 DIAGNOSIS — D75839 Thrombocytosis, unspecified: Secondary | ICD-10-CM

## 2016-11-14 NOTE — Progress Notes (Signed)
Patient ID: Michelle Hawkins, female   DOB: 11/09/1945, 71 y.o.   MRN: [id number]   Subjective:    Patient ID: Michelle Hawkins, female    DOB: [birth year], 71 y.o.   MRN: [id number]  HPI  Patient here for a scheduled follow up.  She reports she is doing relatively well.  Some persistent neck pain.  Taking tylenol.  Moves into her shoulder.  No chest pain.  Breathing stable.  No acid reflux.  No abdominal pain.  Bowels moving.  Discussed her sugars.  Discussed diet and exercise.  Discussed recent lab results.    Past Medical History:  Diagnosis Date  . Allergic rhinitis   . Breast cancer ([place]) 1996   s/p left breast lumpectomy (lymph node dissection - 2/11 positive), chemotherapy  . Breast cancer ([place]) 1995   right breast lumpectomy  . Diabetes mellitus   . Dysphagia   . GERD (gastroesophageal reflux disease)   . H/O ulcer disease    PUD  . Hypercholesterolemia   . Hypothyroidism    multinodular goiter  . Nephrolithiasis   . Scoliosis   . Skin cancer   . SVT (supraventricular tachycardia) ([facility])    Past Surgical History:  Procedure Laterality Date  . ABDOMINAL HYSTERECTOMY     partial  . BREAST BIOPSY Left 09/2012   benign  . BREAST LUMPECTOMY WITH AXILLARY LYMPH NODE DISSECTION  1996   left  . LUMBAR LAMINECTOMY  3/07   L5-S1   Family History  Problem Relation Age of Onset  . Heart disease Father   . Hypertension Father   . Diabetes Father   . Alzheimer's disease Mother   . Diabetes Sister   . Diabetes Sister   . Diabetes Sister   . Diabetes Sister    Social History   Social History  . Marital status: Married    Spouse name: N/A  . Number of children: N/A  . Years of education: N/A   Social History Main Topics  . Smoking status: Former Smoker    Packs/day: 0.50    Years: 40.00    Types: Cigarettes    Quit date: 04/14/2004  . Smokeless tobacco: Never Used  . Alcohol use No  . Drug use: No  . Sexual activity: No   Other Topics Concern  . None   Social  History Narrative  . None    Outpatient Encounter Prescriptions as of 11/14/2016  Medication Sig  . ACCU-CHEK SOFTCLIX LANCETS lancets Check blood sugar twice daily Dx 250.00  . aspirin 81 MG tablet Take 81 mg by mouth 2 (two) times daily.   Marland Kitchen atorvastatin (LIPITOR) 20 MG tablet Take 1 tablet (20 mg total) by mouth daily.  . Biotin (BIOTIN 5000) 5 MG CAPS Take by mouth.  . Blood Glucose Monitoring Suppl (ONE TOUCH ULTRA SYSTEM KIT) w/Device KIT 1 kit by Does not apply route once.  . Blood Glucose Monitoring Suppl (ONE TOUCH ULTRA SYSTEM KIT) w/Device KIT 1 kit by Does not apply route once.  . calcium carbonate 200 MG capsule Take 250 mg by mouth 2 (two) times daily with a meal.  . cholecalciferol (VITAMIN D) 400 UNITS TABS Take by mouth.  . escitalopram (LEXAPRO) 20 MG tablet Take 1 tablet (20 mg total) by mouth daily.  . fish oil-omega-3 fatty acids 1000 MG capsule Take 2 g by mouth daily.  Marland Kitchen levothyroxine (SYNTHROID, LEVOTHROID) 25 MCG tablet Take 1 tablet (25 mcg total) by mouth daily.  Marland Kitchen levothyroxine (SYNTHROID, LEVOTHROID)  25 MCG tablet TAKE 1 TABLET BY MOUTH DAILY  . losartan (COZAAR) 100 MG tablet Take 1 tablet (100 mg total) by mouth daily.  . metFORMIN (GLUCOPHAGE) 500 MG tablet Take 2 tablets (1,000 mg total) by mouth 2 (two) times daily.  . metoprolol succinate (TOPROL-XL) 50 MG 24 hr tablet TAKE 1 TABLET  DAILY. TAKE WITH OR IMMEDIATELY FOLLOWING A MEAL.  . Multiple Vitamin (MULTIVITAMIN WITH MINERALS) TABS tablet Take 1 tablet by mouth daily.  Marland Kitchen omeprazole (PRILOSEC) 40 MG capsule Take 1 capsule (40 mg total) by mouth daily.  . ONE TOUCH ULTRA TEST test strip TEST BLOOD SUGAR TWICE DAILY  . Probiotic Product (PROBIOTIC DAILY PO) Take 1 tablet by mouth.  . TURMERIC PO Take by mouth daily.    No facility-administered encounter medications on file as of 11/14/2016.     Review of Systems  Constitutional: Negative for appetite change and unexpected weight change.  HENT: Negative  for congestion and sinus pressure.   Respiratory: Negative for cough, chest tightness and shortness of breath.   Cardiovascular: Negative for chest pain, palpitations and leg swelling.  Gastrointestinal: Negative for abdominal pain, diarrhea, nausea and vomiting.  Genitourinary: Negative for difficulty urinating and dysuria.  Musculoskeletal: Positive for neck pain. Negative for joint swelling.  Skin: Negative for color change and rash.  Neurological: Negative for dizziness, light-headedness and headaches.  Psychiatric/Behavioral: Negative for agitation and dysphoric mood.       Objective:    Physical Exam  Constitutional: She appears well-developed and well-nourished. No distress.  HENT:  Nose: Nose normal.  Mouth/Throat: Oropharynx is clear and moist.  Neck: Neck supple. No thyromegaly present.  Cardiovascular: Normal rate and regular rhythm.   Pulmonary/Chest: Breath sounds normal. No respiratory distress. She has no wheezes.  Abdominal: Soft. Bowel sounds are normal. There is no tenderness.  Musculoskeletal: She exhibits no edema or tenderness.  Some increased discomfort with rotation of her head from left to right - some pulling sensation.    Lymphadenopathy:    She has no cervical adenopathy.  Skin: No rash noted. No erythema.  Psychiatric: She has a normal mood and affect. Her behavior is normal.    BP 132/70   Pulse (!) 56   Temp 97.9 F (36.6 C) (Oral)   Wt 153 lb 3.2 oz (69.5 kg)   SpO2 98%   BMI 25.89 kg/m  Wt Readings from Last 3 Encounters:  11/14/16 153 lb 3.2 oz (69.5 kg)  09/24/16 152 lb 8 oz (69.2 kg)  09/20/16 152 lb 12.8 oz (69.3 kg)     Lab Results  Component Value Date   WBC 9.2 11/06/2016   HGB 13.5 11/06/2016   HCT 41.0 11/06/2016   PLT 350.0 11/06/2016   GLUCOSE 142 (H) 11/06/2016   CHOL 166 11/06/2016   TRIG 106.0 11/06/2016   HDL 58.60 11/06/2016   LDLCALC 86 11/06/2016   ALT 31 11/06/2016   AST 25 11/06/2016   NA 133 (L) 11/06/2016    K 4.5 11/06/2016   CL 96 11/06/2016   CREATININE 0.67 11/06/2016   BUN 13 11/06/2016   CO2 33 (H) 11/06/2016   TSH 1.96 02/27/2016   HGBA1C 7.5 (H) 11/06/2016   MICROALBUR 0.8 11/06/2016    Mm Screening Breast Tomo Bilateral  Result Date: 09/22/2015 CLINICAL DATA:  Screening. History of bilateral breast cancers and lumpectomies in the 90s. EXAM: DIGITAL SCREENING BILATERAL MAMMOGRAM WITH 3D TOMO WITH CAD COMPARISON:  Previous exam(s). ACR Breast Density Category b: There  are scattered areas of fibroglandular density. FINDINGS: There are no findings suspicious for malignancy. Bilateral breast scarring again noted. Images were processed with CAD. IMPRESSION: No mammographic evidence of malignancy. A result letter of this screening mammogram will be mailed directly to the patient. RECOMMENDATION: Screening mammogram in one year. (Code:SM-B-01Y) BI-RADS CATEGORY  2: Benign. Electronically Signed   By: Margarette Canada M.D.   On: 09/22/2015 13:46       Assessment & Plan:   Problem List Items Addressed This Visit    None       Einar Pheasant, MD

## 2016-11-16 ENCOUNTER — Other Ambulatory Visit: Payer: Self-pay | Admitting: Internal Medicine

## 2016-11-16 DIAGNOSIS — M542 Cervicalgia: Secondary | ICD-10-CM

## 2016-11-16 NOTE — Progress Notes (Signed)
Order placed for physical therapy referral.

## 2016-11-26 ENCOUNTER — Encounter: Payer: Self-pay | Admitting: Internal Medicine

## 2016-11-26 NOTE — Assessment & Plan Note (Signed)
Platelet count 11/06/16 - wnl.

## 2016-11-26 NOTE — Assessment & Plan Note (Signed)
Blood pressure under good control.  Continue same medication regimen.  Follow pressures.  Follow metabolic panel.   

## 2016-11-26 NOTE — Assessment & Plan Note (Signed)
On thyroid replacement.  Follow tsh.  

## 2016-11-26 NOTE — Assessment & Plan Note (Signed)
Mammogram 09/22/15 - Birads II.  Scheduled for f/u mammogram.

## 2016-11-26 NOTE — Assessment & Plan Note (Signed)
Low carb diet and exercise.  Discussed with her today.  Discussed changing her medication.  She declines.  Wants to work on diet and exercise.  Feels she can get it down with diet adjustment and exercise.  Follow met b and a1c.

## 2016-11-26 NOTE — Assessment & Plan Note (Signed)
Persistent.  Check c-spine xray.

## 2016-11-26 NOTE — Assessment & Plan Note (Signed)
On lipitor.  Low cholesterol diet and exercise.  LDL 86.  Follow lipid panel and liver function tests.

## 2016-11-26 NOTE — Assessment & Plan Note (Signed)
Sodium slightly decreased on recent check.  Follow sodium level.

## 2016-11-26 NOTE — Assessment & Plan Note (Signed)
Controlled on omeprazole.   

## 2016-11-27 ENCOUNTER — Ambulatory Visit
Admission: RE | Admit: 2016-11-27 | Discharge: 2016-11-27 | Disposition: A | Discharge status: Home / Self Care | Payer: PPO | Source: Ambulatory Visit | Attending: Internal Medicine | Admitting: Internal Medicine

## 2016-11-27 DIAGNOSIS — Z1231 Encounter for screening mammogram for malignant neoplasm of breast: Secondary | ICD-10-CM | POA: Diagnosis not present

## 2016-11-27 LAB — HM MAMMOGRAPHY

## 2016-12-04 ENCOUNTER — Other Ambulatory Visit (INDEPENDENT_AMBULATORY_CARE_PROVIDER_SITE_OTHER): Payer: PPO

## 2016-12-04 DIAGNOSIS — E871 Hypo-osmolality and hyponatremia: Secondary | ICD-10-CM | POA: Diagnosis not present

## 2016-12-04 LAB — SODIUM: Sodium: 132 mEq/L — ABNORMAL LOW (ref 135–145)

## 2016-12-05 ENCOUNTER — Other Ambulatory Visit: Payer: Self-pay | Admitting: Internal Medicine

## 2016-12-05 DIAGNOSIS — E871 Hypo-osmolality and hyponatremia: Secondary | ICD-10-CM

## 2016-12-05 NOTE — Progress Notes (Signed)
Order placed for f/u lab.   

## 2016-12-25 ENCOUNTER — Other Ambulatory Visit (INDEPENDENT_AMBULATORY_CARE_PROVIDER_SITE_OTHER): Payer: PPO

## 2016-12-25 DIAGNOSIS — E871 Hypo-osmolality and hyponatremia: Secondary | ICD-10-CM

## 2016-12-26 ENCOUNTER — Other Ambulatory Visit: Payer: Self-pay | Admitting: Internal Medicine

## 2016-12-26 ENCOUNTER — Other Ambulatory Visit: Payer: PPO

## 2016-12-26 LAB — BASIC METABOLIC PANEL
BUN: 14 mg/dL (ref 6–23)
CALCIUM: 10.3 mg/dL (ref 8.4–10.5)
CHLORIDE: 96 meq/L (ref 96–112)
CO2: 28 mEq/L (ref 19–32)
CREATININE: 0.7 mg/dL (ref 0.40–1.20)
GFR: 87.49 mL/min (ref >60.00)
Glucose, Bld: 89 mg/dL (ref 70–99)
Potassium: 4.7 mEq/L (ref 3.5–5.1)
Sodium: 133 mEq/L — ABNORMAL LOW (ref 135–145)

## 2017-01-28 ENCOUNTER — Other Ambulatory Visit: Payer: Self-pay | Admitting: Internal Medicine

## 2017-02-12 ENCOUNTER — Other Ambulatory Visit: Payer: PPO

## 2017-02-14 ENCOUNTER — Other Ambulatory Visit (INDEPENDENT_AMBULATORY_CARE_PROVIDER_SITE_OTHER): Payer: PPO

## 2017-02-14 LAB — CALCIUM: Calcium: 10.1 mg/dL (ref 8.4–10.5)

## 2017-02-18 ENCOUNTER — Telehealth: Payer: Self-pay | Admitting: *Deleted

## 2017-02-18 NOTE — Telephone Encounter (Signed)
Patient questioned if should be seen on 02/19/17. Pt stated that this appt is for a A1c , and she did not have this drawn at her lab visit Pt contact [phone]

## 2017-02-18 NOTE — Telephone Encounter (Signed)
Called patient she wanted to c/a app she does not feel like she needs follow up on anything just needs A1C checked. She wanted to know why app was made?

## 2017-02-19 ENCOUNTER — Ambulatory Visit: Payer: PPO | Admitting: Internal Medicine

## 2017-02-19 ENCOUNTER — Other Ambulatory Visit: Payer: Self-pay | Admitting: Internal Medicine

## 2017-02-19 DIAGNOSIS — I10 Essential (primary) hypertension: Secondary | ICD-10-CM

## 2017-02-19 DIAGNOSIS — E039 Hypothyroidism, unspecified: Secondary | ICD-10-CM

## 2017-02-19 DIAGNOSIS — E119 Type 2 diabetes mellitus without complications: Secondary | ICD-10-CM

## 2017-02-19 DIAGNOSIS — E7800 Pure hypercholesterolemia, unspecified: Secondary | ICD-10-CM

## 2017-02-19 NOTE — Progress Notes (Signed)
Order placed for f/u labs.  

## 2017-02-19 NOTE — Telephone Encounter (Signed)
Will need to reschedule f/u appt in the next month.  Can schedule fasting lab appt 1-2 days prior to appt.

## 2017-02-25 NOTE — Telephone Encounter (Signed)
Pt called looking to schedule appt. Nothing available. Please advise, thank you!

## 2017-02-26 NOTE — Telephone Encounter (Signed)
Pt scheduled  

## 2017-03-05 ENCOUNTER — Other Ambulatory Visit (INDEPENDENT_AMBULATORY_CARE_PROVIDER_SITE_OTHER): Payer: PPO

## 2017-03-05 DIAGNOSIS — E119 Type 2 diabetes mellitus without complications: Secondary | ICD-10-CM

## 2017-03-05 DIAGNOSIS — E039 Hypothyroidism, unspecified: Secondary | ICD-10-CM

## 2017-03-05 DIAGNOSIS — E7800 Pure hypercholesterolemia, unspecified: Secondary | ICD-10-CM | POA: Diagnosis not present

## 2017-03-05 LAB — BASIC METABOLIC PANEL
BUN: 12 mg/dL (ref 6–23)
CHLORIDE: 94 meq/L — AB (ref 96–112)
CO2: 32 meq/L (ref 19–32)
Calcium: 9.9 mg/dL (ref 8.4–10.5)
Creatinine, Ser: 0.7 mg/dL (ref 0.40–1.20)
GFR: 87.44 mL/min (ref >60.00)
Glucose, Bld: 129 mg/dL — ABNORMAL HIGH (ref 70–99)
Potassium: 4.7 mEq/L (ref 3.5–5.1)
SODIUM: 131 meq/L — AB (ref 135–145)

## 2017-03-05 LAB — HEPATIC FUNCTION PANEL
ALK PHOS: 53 U/L (ref 39–117)
ALT: 30 U/L (ref 0–35)
AST: 25 U/L (ref 0–37)
Albumin: 4.1 g/dL (ref 3.5–5.2)
BILIRUBIN DIRECT: 0.1 mg/dL (ref 0.0–0.3)
Total Bilirubin: 0.5 mg/dL (ref 0.2–1.2)
Total Protein: 7.2 g/dL (ref 6.0–8.3)

## 2017-03-05 LAB — HEMOGLOBIN A1C: HEMOGLOBIN A1C: 7.3 % — AB (ref 4.6–6.5)

## 2017-03-05 LAB — TSH: TSH: 2.57 u[IU]/mL (ref 0.35–4.50)

## 2017-03-05 LAB — LIPID PANEL
CHOL/HDL RATIO: 3
Cholesterol: 156 mg/dL (ref 0–200)
HDL: 49.6 mg/dL (ref >39.00)
LDL Cholesterol: 86 mg/dL (ref 0–99)
NonHDL: 106.24
Triglycerides: 99 mg/dL (ref 0.0–149.0)
VLDL: 19.8 mg/dL (ref 0.0–40.0)

## 2017-03-14 ENCOUNTER — Encounter: Payer: Self-pay | Admitting: Internal Medicine

## 2017-03-14 ENCOUNTER — Ambulatory Visit (INDEPENDENT_AMBULATORY_CARE_PROVIDER_SITE_OTHER): Payer: PPO | Admitting: Internal Medicine

## 2017-03-14 VITALS — BP 128/70 | HR 58 | Temp 97.9°F | Ht 64.57 in | Wt 148.8 lb

## 2017-03-14 DIAGNOSIS — E039 Hypothyroidism, unspecified: Secondary | ICD-10-CM

## 2017-03-14 DIAGNOSIS — Z23 Encounter for immunization: Secondary | ICD-10-CM

## 2017-03-14 DIAGNOSIS — E871 Hypo-osmolality and hyponatremia: Secondary | ICD-10-CM | POA: Diagnosis not present

## 2017-03-14 DIAGNOSIS — K219 Gastro-esophageal reflux disease without esophagitis: Secondary | ICD-10-CM | POA: Diagnosis not present

## 2017-03-14 DIAGNOSIS — I10 Essential (primary) hypertension: Secondary | ICD-10-CM | POA: Diagnosis not present

## 2017-03-14 DIAGNOSIS — E7800 Pure hypercholesterolemia, unspecified: Secondary | ICD-10-CM | POA: Diagnosis not present

## 2017-03-14 DIAGNOSIS — Z853 Personal history of malignant neoplasm of breast: Secondary | ICD-10-CM

## 2017-03-14 DIAGNOSIS — E119 Type 2 diabetes mellitus without complications: Secondary | ICD-10-CM | POA: Diagnosis not present

## 2017-03-14 LAB — HM DIABETES FOOT EXAM

## 2017-03-14 LAB — SODIUM: Sodium: 134 mEq/L — ABNORMAL LOW (ref 135–145)

## 2017-03-14 NOTE — Progress Notes (Signed)
Patient ID: Michelle Hawkins, female   DOB: 05-23-1946, 71 y.o.   MRN: [id number]   Subjective:    Patient ID: Michelle Hawkins, female    DOB: 1945/07/18, 70 y.o.   MRN: [id number]  HPI  Patient here for a scheduled follow up.  She reports she is doing well.  Feels good.  Trying to stay active.  Discussed her diet and exercise.  Has lost some weight.  No chest pain.  No sob.  No acid reflux.  No abdominal pain.  Bowels moving.  Discussed her lab results.  Slightly decreased sodium.  Discussed sugars.  She plans to get more serious about her diet.     Past Medical History:  Diagnosis Date  . Allergic rhinitis   . Breast cancer ([place]) 1996   s/p left breast lumpectomy (lymph node dissection - 2/11 positive), chemotherapy  . Breast cancer ([place]) 1995   right breast lumpectomy  . Diabetes mellitus   . Dysphagia   . GERD (gastroesophageal reflux disease)   . H/O ulcer disease    PUD  . Hypercholesterolemia   . Hypothyroidism    multinodular goiter  . Nephrolithiasis   . Scoliosis   . Skin cancer   . SVT (supraventricular tachycardia) ([facility])    Past Surgical History:  Procedure Laterality Date  . ABDOMINAL HYSTERECTOMY     partial  . BREAST BIOPSY Left 09/2012   benign  . BREAST LUMPECTOMY Left 1996   lumpectomy with chemo and rad tx for breast ca (lymph node dissection - 2/11 positive  . BREAST LUMPECTOMY Right 1995   lumpectomy only  . BREAST LUMPECTOMY WITH AXILLARY LYMPH NODE DISSECTION  1996   left  . LUMBAR LAMINECTOMY  3/07   L5-S1   Family History  Problem Relation Age of Onset  . Heart disease Father   . Hypertension Father   . Diabetes Father   . Alzheimer's disease Mother   . Diabetes Sister   . Diabetes Sister   . Diabetes Sister   . Diabetes Sister    Social History   Social History  . Marital status: Married    Spouse name: N/A  . Number of children: N/A  . Years of education: N/A   Social History Main Topics  . Smoking status: Former Smoker    Packs/day:  0.50    Years: 40.00    Types: Cigarettes    Quit date: 04/14/2004  . Smokeless tobacco: Never Used  . Alcohol use No  . Drug use: No  . Sexual activity: No   Other Topics Concern  . None   Social History Narrative  . None    Outpatient Encounter Prescriptions as of 03/14/2017  Medication Sig  . ACCU-CHEK SOFTCLIX LANCETS lancets Check blood sugar twice daily Dx 250.00  . aspirin 81 MG tablet Take 81 mg by mouth 2 (two) times daily.   Marland Kitchen atorvastatin (LIPITOR) 20 MG tablet Take 1 tablet (20 mg total) by mouth daily.  . Biotin (BIOTIN 5000) 5 MG CAPS Take by mouth.  . Blood Glucose Monitoring Suppl (ONE TOUCH ULTRA SYSTEM KIT) w/Device KIT 1 kit by Does not apply route once.  . Blood Glucose Monitoring Suppl (ONE TOUCH ULTRA SYSTEM KIT) w/Device KIT 1 kit by Does not apply route once.  . calcium carbonate 200 MG capsule Take 250 mg by mouth 2 (two) times daily with a meal.  . cholecalciferol (VITAMIN D) 400 UNITS TABS Take by mouth.  . escitalopram (LEXAPRO) 20 MG  tablet Take 1 tablet (20 mg total) by mouth daily.  . fish oil-omega-3 fatty acids 1000 MG capsule Take 2 g by mouth daily.  Marland Kitchen levothyroxine (SYNTHROID, LEVOTHROID) 25 MCG tablet Take 1 tablet (25 mcg total) by mouth daily.  Marland Kitchen levothyroxine (SYNTHROID, LEVOTHROID) 25 MCG tablet TAKE 1 TABLET BY MOUTH DAILY  . losartan (COZAAR) 100 MG tablet Take 1 tablet (100 mg total) by mouth daily.  . metFORMIN (GLUCOPHAGE) 500 MG tablet Take 2 tablets (1,000 mg total) by mouth 2 (two) times daily.  . metoprolol succinate (TOPROL-XL) 50 MG 24 hr tablet TAKE 1 TABLET  DAILY. TAKE WITH OR IMMEDIATELY FOLLOWING A MEAL.  . Multiple Vitamin (MULTIVITAMIN WITH MINERALS) TABS tablet Take 1 tablet by mouth daily.  Marland Kitchen omeprazole (PRILOSEC) 40 MG capsule TAKE ONE CAPSULE BY MOUTH ONCE DAILY  . ONE TOUCH ULTRA TEST test strip TEST BLOOD SUGAR TWICE DAILY  . Probiotic Product (PROBIOTIC DAILY PO) Take 1 tablet by mouth.  . TURMERIC PO Take by mouth  daily.    No facility-administered encounter medications on file as of 03/14/2017.     Review of Systems  Constitutional: Negative for appetite change and unexpected weight change.  HENT: Negative for congestion and sinus pressure.   Respiratory: Negative for cough, chest tightness and shortness of breath.   Cardiovascular: Negative for chest pain, palpitations and leg swelling.  Gastrointestinal: Negative for abdominal pain, diarrhea, nausea and vomiting.  Genitourinary: Negative for difficulty urinating and dysuria.  Musculoskeletal: Negative for back pain and joint swelling.  Skin: Negative for color change and rash.  Neurological: Negative for dizziness, light-headedness and headaches.  Psychiatric/Behavioral: Negative for agitation and dysphoric mood.       Objective:     Blood pressure rechecked by me:  130/72  Physical Exam  Constitutional: She appears well-developed and well-nourished. No distress.  HENT:  Nose: Nose normal.  Mouth/Throat: Oropharynx is clear and moist.  Neck: Neck supple. No thyromegaly present.  Cardiovascular: Normal rate and regular rhythm.   Pulmonary/Chest: Breath sounds normal. No respiratory distress. She has no wheezes.  Abdominal: Soft. Bowel sounds are normal. There is no tenderness.  Musculoskeletal: She exhibits no edema or tenderness.  Foot exam:  No lesions.  Intact to pin prick, light touch.  DP pulses palpable and equal bilaterally.    Lymphadenopathy:    She has no cervical adenopathy.  Skin: No rash noted. No erythema.  Psychiatric: She has a normal mood and affect. Her behavior is normal.    BP 128/70 (BP Location: Left Arm, Patient Position: Sitting, Cuff Size: Normal)   Pulse (!) 58   Temp 97.9 F (36.6 C) (Oral)   Ht 5' 4.57" (1.64 m)   Wt 148 lb 12.8 oz (67.5 kg)   SpO2 96%   BMI 25.09 kg/m  Wt Readings from Last 3 Encounters:  03/14/17 148 lb 12.8 oz (67.5 kg)  11/14/16 153 lb 3.2 oz (69.5 kg)  09/24/16 152 lb 8 oz  (69.2 kg)     Lab Results  Component Value Date   WBC 9.2 11/06/2016   HGB 13.5 11/06/2016   HCT 41.0 11/06/2016   PLT 350.0 11/06/2016   GLUCOSE 129 (H) 03/05/2017   CHOL 156 03/05/2017   TRIG 99.0 03/05/2017   HDL 49.60 03/05/2017   LDLCALC 86 03/05/2017   ALT 30 03/05/2017   AST 25 03/05/2017   NA 134 (L) 03/14/2017   K 4.7 03/05/2017   CL 94 (L) 03/05/2017   CREATININE 0.70  03/05/2017   BUN 12 03/05/2017   CO2 32 03/05/2017   TSH 2.57 03/05/2017   HGBA1C 7.3 (H) 03/05/2017   MICROALBUR 0.8 11/06/2016    Mm Screening Breast Tomo Bilateral  Result Date: 11/27/2016 CLINICAL DATA:  Screening. History of bilateral breast cancer. EXAM: 2D DIGITAL SCREENING BILATERAL MAMMOGRAM WITH CAD AND ADJUNCT TOMO COMPARISON:  Previous exam(s). ACR Breast Density Category c: The breast tissue is heterogeneously dense, which may obscure small masses. FINDINGS: There are no findings suspicious for malignancy. Post operative changes are seen in both breasts. Images were processed with CAD. IMPRESSION: No mammographic evidence of malignancy. A result letter of this screening mammogram will be mailed directly to the patient. RECOMMENDATION: Screening mammogram in one year. (Code:SM-B-01Y) BI-RADS CATEGORY  1: Negative. Electronically Signed   By: Nolon Nations M.D.   On: 11/27/2016 12:30       Assessment & Plan:   Problem List Items Addressed This Visit    Diabetes mellitus ([place])    Low carb diet and exercise.  Discussed recent labs.  Plans to get more serious about her diet.  Follow met b and a1c.        Relevant Orders   Hemoglobin A1c   GERD (gastroesophageal reflux disease)    Controlled on omeprazole.        History of breast cancer    Mammogram 11/27/16 - Birads I.        Hypercholesterolemia    On lipitor.  Low cholesterol diet and exercise.  Follow lipid panel and liver function tests.   Lab Results  Component Value Date   CHOL 156 03/05/2017   HDL 49.60 03/05/2017    LDLCALC 86 03/05/2017   TRIG 99.0 03/05/2017   CHOLHDL 3 03/05/2017        Relevant Orders   Hepatic function panel   Lipid panel   Hypertension    Blood pressure under good control.  Continue same medication regimen.  Follow pressures.  Follow metabolic panel.        Relevant Orders   Basic metabolic panel   Hyponatremia - Primary    Sodium slightly decreased on recent check.  Follow sodium level.  Recheck today.        Relevant Orders   Sodium (Completed)   Hypothyroidism    On thyroid replacement.  Follow tsh.         Other Visit Diagnoses    Encounter for immunization       Relevant Orders   Sodium (Completed)   Flu vaccine HIGH DOSE PF (Completed)       Einar Pheasant, MD

## 2017-03-14 NOTE — Patient Instructions (Signed)
You need pneumovax

## 2017-03-16 NOTE — Assessment & Plan Note (Signed)
Controlled on omeprazole.   

## 2017-03-16 NOTE — Assessment & Plan Note (Signed)
Blood pressure under good control.  Continue same medication regimen.  Follow pressures.  Follow metabolic panel.   

## 2017-03-16 NOTE — Assessment & Plan Note (Signed)
Low carb diet and exercise.  Discussed recent labs.  Plans to get more serious about her diet.  Follow met b and a1c.

## 2017-03-16 NOTE — Assessment & Plan Note (Signed)
Sodium slightly decreased on recent check.  Follow sodium level.  Recheck today.

## 2017-03-16 NOTE — Assessment & Plan Note (Signed)
On thyroid replacement.  Follow tsh.  

## 2017-03-16 NOTE — Assessment & Plan Note (Signed)
On lipitor.  Low cholesterol diet and exercise.  Follow lipid panel and liver function tests.   Lab Results  Component Value Date   CHOL 156 03/05/2017   HDL 49.60 03/05/2017   LDLCALC 86 03/05/2017   TRIG 99.0 03/05/2017   CHOLHDL 3 03/05/2017

## 2017-03-16 NOTE — Assessment & Plan Note (Signed)
Mammogram 11/27/16 - Birads I.

## 2017-04-25 ENCOUNTER — Ambulatory Visit: Payer: PPO

## 2017-04-25 ENCOUNTER — Telehealth: Payer: Self-pay | Admitting: Internal Medicine

## 2017-04-25 DIAGNOSIS — E119 Type 2 diabetes mellitus without complications: Secondary | ICD-10-CM | POA: Diagnosis not present

## 2017-04-25 LAB — HM DIABETES EYE EXAM

## 2017-04-25 NOTE — Telephone Encounter (Signed)
Patient has completed her pneumonia vaccinations she does not need any pneumonia vaccine can cancel unless she needs flu shot. Tried to reach patient no answer left message to call office.

## 2017-05-21 ENCOUNTER — Telehealth: Payer: Self-pay

## 2017-05-21 NOTE — Telephone Encounter (Signed)
LMTCB. CRM started asking to please clarify that patient is taking Lexapro 20 mg daily.

## 2017-05-21 NOTE — Telephone Encounter (Signed)
Confirm she is on lexapro 20mg  q day.  If so, can try changing to citalopram 20mg  q day.  Will have to follow and see how she responds to this medication.  Let me know if questions or problems.

## 2017-05-21 NOTE — Telephone Encounter (Signed)
Is this something we can do without an office visit or would you like for her to be seen?  Copied from [place] [id number]. Topic: Inquiry >> May 21, 2017 10:06 AM Pricilla Handler wrote: Reason for CRM: Patient called stating that she has to change her insurance soon. Patient wants Dr. Nicki Reaper to change her current anxiety medication of Escitalopram to Citalopram. Patient wants a call back today.

## 2017-05-22 ENCOUNTER — Telehealth: Payer: Self-pay | Admitting: Internal Medicine

## 2017-05-22 NOTE — Telephone Encounter (Signed)
Patient is currently taking Lexapro 20 mg daily but is not wanting to change right now. She was wanting to know if we would be able to switch her to citalopram if she switched insurance companies. Patient and her husband stated they would call us back with what needed to be done.

## 2017-05-22 NOTE — Telephone Encounter (Signed)
See other phone note

## 2017-05-22 NOTE — Telephone Encounter (Unsigned)
Copied from [place] #[id number]. Topic: Inquiry >> May 22, 2017  9:59 AM Malena Catholic I, NT wrote: Reason for CRM: Pt returning call to Nurse

## 2017-06-19 ENCOUNTER — Telehealth: Payer: Self-pay | Admitting: Internal Medicine

## 2017-06-19 NOTE — Telephone Encounter (Signed)
Copied from [place] #[id number]. Topic: Quick Communication - Rx Refill/Question >> Jun 19, 2017  1:23 PM Yvette Rack wrote: Has the patient contacted their pharmacy? No.   (Agent: If no, request that the patient contact the pharmacy for the refill.)  Patient would  like for Scott to call something in for head congestion and cough she has tried OTC Robitussin DM but helped with cough for a few hours she has been sick for 2 weeks and have made an appt with [facility] on Monday just incase she doesn't feel better by Sunday   Preferred Pharmacy (with phone number or street name): [facility], [place] - [phone] (Phone)     Agent: Please be advised that RX refills may take up to 3 business days. We ask that you follow-up with your pharmacy.

## 2017-06-20 ENCOUNTER — Encounter: Payer: Self-pay | Admitting: Internal Medicine

## 2017-06-20 ENCOUNTER — Ambulatory Visit: Payer: Self-pay | Admitting: *Deleted

## 2017-06-20 ENCOUNTER — Ambulatory Visit: Payer: PPO | Admitting: Internal Medicine

## 2017-06-20 VITALS — BP 138/72 | HR 59 | Temp 98.1°F | Ht 64.57 in | Wt 148.1 lb

## 2017-06-20 DIAGNOSIS — J329 Chronic sinusitis, unspecified: Secondary | ICD-10-CM | POA: Diagnosis not present

## 2017-06-20 DIAGNOSIS — J069 Acute upper respiratory infection, unspecified: Secondary | ICD-10-CM | POA: Diagnosis not present

## 2017-06-20 MED ORDER — DOXYCYCLINE HYCLATE 100 MG PO TABS: 100.0000 mg | ORAL_TABLET | Freq: Two times a day (BID) | ORAL | 0 refills | Status: DC

## 2017-06-20 NOTE — Patient Instructions (Signed)
Call back 1 week sooner If needed  Take Doxycycline 100 mg 2x per day x 1 week   Sinusitis, Adult Sinusitis is soreness and inflammation of your sinuses. Sinuses are hollow spaces in the bones around your face. Your sinuses are located:  Around your eyes.  In the middle of your forehead.  Behind your nose.  In your cheekbones.  Your sinuses and nasal passages are lined with a stringy fluid (mucus). Mucus normally drains out of your sinuses. When your nasal tissues become inflamed or swollen, the mucus can become trapped or blocked so air cannot flow through your sinuses. This allows bacteria, viruses, and funguses to grow, which leads to infection. Sinusitis can develop quickly and last for 7?10 days (acute) or for more than 12 weeks (chronic). Sinusitis often develops after a cold. What are the causes? This condition is caused by anything that creates swelling in the sinuses or stops mucus from draining, including:  Allergies.  Asthma.  Bacterial or viral infection.  Abnormally shaped bones between the nasal passages.  Nasal growths that contain mucus (nasal polyps).  Narrow sinus openings.  Pollutants, such as chemicals or irritants in the air.  A foreign object stuck in the nose.  A fungal infection. This is rare.  What increases the risk? The following factors may make you more likely to develop this condition:  Having allergies or asthma.  Having had a recent cold or respiratory tract infection.  Having structural deformities or blockages in your nose or sinuses.  Having a weak immune system.  Doing a lot of swimming or diving.  Overusing nasal sprays.  Smoking.  What are the signs or symptoms? The main symptoms of this condition are pain and a feeling of pressure around the affected sinuses. Other symptoms include:  Upper toothache.  Earache.  Headache.  Bad breath.  Decreased sense of smell and taste.  A cough that may get worse at  night.  Fatigue.  Fever.  Thick drainage from your nose. The drainage is often green and it may contain pus (purulent).  Stuffy nose or congestion.  Postnasal drip. This is when extra mucus collects in the throat or back of the nose.  Swelling and warmth over the affected sinuses.  Sore throat.  Sensitivity to light.  How is this diagnosed? This condition is diagnosed based on symptoms, a medical history, and a physical exam. To find out if your condition is acute or chronic, your health care provider may:  Look in your nose for signs of nasal polyps.  Tap over the affected sinus to check for signs of infection.  View the inside of your sinuses using an imaging device that has a light attached (endoscope).  If your health care provider suspects that you have chronic sinusitis, you may also:  Be tested for allergies.  Have a sample of mucus taken from your nose (nasal culture) and checked for bacteria.  Have a mucus sample examined to see if your sinusitis is related to an allergy.  If your sinusitis does not respond to treatment and it lasts longer than 8 weeks, you may have an MRI or CT scan to check your sinuses. These scans also help to determine how severe your infection is. In rare cases, a bone biopsy may be done to rule out more serious types of fungal sinus disease. How is this treated? Treatment for sinusitis depends on the cause and whether your condition is chronic or acute. If a virus is causing your sinusitis,  your symptoms will go away on their own within 10 days. You may be given medicines to relieve your symptoms, including:  Topical nasal decongestants. They shrink swollen nasal passages and let mucus drain from your sinuses.  Antihistamines. These drugs block inflammation that is triggered by allergies. This can help to ease swelling in your nose and sinuses.  Topical nasal corticosteroids. These are nasal sprays that ease inflammation and swelling in  your nose and sinuses.  Nasal saline washes. These rinses can help to get rid of thick mucus in your nose.  If your condition is caused by bacteria, you will be given an antibiotic medicine. If your condition is caused by a fungus, you will be given an antifungal medicine. Surgery may be needed to correct underlying conditions, such as narrow nasal passages. Surgery may also be needed to remove polyps. Follow these instructions at home: Medicines  Take, use, or apply over-the-counter and prescription medicines only as told by your health care provider. These may include nasal sprays.  If you were prescribed an antibiotic medicine, take it as told by your health care provider. Do not stop taking the antibiotic even if you start to feel better. Hydrate and Humidify  Drink enough water to keep your urine clear or pale yellow. Staying hydrated will help to thin your mucus.  Use a cool mist humidifier to keep the humidity level in your home above 50%.  Inhale steam for 10-15 minutes, 3-4 times a day or as told by your health care provider. You can do this in the bathroom while a hot shower is running.  Limit your exposure to cool or dry air. Rest  Rest as much as possible.  Sleep with your head raised (elevated).  Make sure to get enough sleep each night. General instructions  Apply a warm, moist washcloth to your face 3-4 times a day or as told by your health care provider. This will help with discomfort.  Wash your hands often with soap and water to reduce your exposure to viruses and other germs. If soap and water are not available, use hand sanitizer.  Do not smoke. Avoid being around people who are smoking (secondhand smoke).  Keep all follow-up visits as told by your health care provider. This is important. Contact a health care provider if:  You have a fever.  Your symptoms get worse.  Your symptoms do not improve within 10 days. Get help right away if:  You have a  severe headache.  You have persistent vomiting.  You have pain or swelling around your face or eyes.  You have vision problems.  You develop confusion.  Your neck is stiff.  You have trouble breathing. This information is not intended to replace advice given to you by your health care provider. Make sure you discuss any questions you have with your health care provider. Document Released: 06/25/2005 Document Revised: 02/19/2016 Document Reviewed: 04/20/2015 Elsevier Interactive Patient Education  2017 [place] Upper Respiratory Infection, Adult Most upper respiratory infections (URIs) are caused by a virus. A URI affects the nose, throat, and upper air passages. The most common type of URI is often called "the common cold." Follow these instructions at home:  Take medicines only as told by your doctor.  Gargle warm saltwater or take cough drops to comfort your throat as told by your doctor.  Use a warm mist humidifier or inhale steam from a shower to increase air moisture. This may make it easier to breathe.  Drink  enough fluid to keep your pee (urine) clear or pale yellow.  Eat soups and other clear broths.  Have a healthy diet.  Rest as needed.  Go back to work when your fever is gone or your doctor says it is okay. ? You may need to stay home longer to avoid giving your URI to others. ? You can also wear a face mask and wash your hands often to prevent spread of the virus.  Use your inhaler more if you have asthma.  Do not use any tobacco products, including cigarettes, chewing tobacco, or electronic cigarettes. If you need help quitting, ask your doctor. Contact a doctor if:  You are getting worse, not better.  Your symptoms are not helped by medicine.  You have chills.  You are getting more short of breath.  You have brown or red mucus.  You have yellow or brown discharge from your nose.  You have pain in your face, especially when you bend  forward.  You have a fever.  You have puffy (swollen) neck glands.  You have pain while swallowing.  You have white areas in the back of your throat. Get help right away if:  You have very bad or constant: ? Headache. ? Ear pain. ? Pain in your forehead, behind your eyes, and over your cheekbones (sinus pain). ? Chest pain.  You have long-lasting (chronic) lung disease and any of the following: ? Wheezing. ? Long-lasting cough. ? Coughing up blood. ? A change in your usual mucus.  You have a stiff neck.  You have changes in your: ? Vision. ? Hearing. ? Thinking. ? Mood. This information is not intended to replace advice given to you by your health care provider. Make sure you discuss any questions you have with your health care provider. Document Released: 12/12/2007 Document Revised: 02/26/2016 Document Reviewed: 09/30/2013 Elsevier Interactive Patient Education  2018 [employer].

## 2017-06-20 NOTE — Telephone Encounter (Addendum)
  Reason for Disposition . [1] Continuous (nonstop) coughing interferes with work or school AND [2] no improvement using cough treatment per protocol  Answer Assessment - Initial Assessment Questions 1. ONSET: "When did the cough begin?"      2 weeks ago 2. SEVERITY: "How bad is the cough today?"      Coughed all morning and has improved with medication 3. RESPIRATORY DISTRESS: "Describe your breathing."      Ok, just feels congested 4. FEVER: "Do you have a fever?" If so, ask: "What is your temperature, how was it measured, and when did it start?"     No 5. HEMOPTYSIS: "Are you coughing up any blood?" If so ask: "How much?" (flecks, streaks, tablespoons, etc.)     No 6. TREATMENT: "What have you done so far to treat the cough?" (e.g., meds, fluids, humidifier)     Robitussin DM 7. CARDIAC HISTORY: "Do you have any history of heart disease?" (e.g., heart attack, congestive heart failure)      No 8. LUNG HISTORY: "Do you have any history of lung disease?"  (e.g., pulmonary embolus, asthma, emphysema)     No 9. PE RISK FACTORS: "Do you have a history of blood clots?" (or: recent major surgery, recent prolonged travel, bedridden )     No 10. OTHER SYMPTOMS: "Do you have any other symptoms? (e.g., runny nose, wheezing, chest pain)       Runny nose, congestion 11. TRAVEL: "Have you traveled out of the country in the last month?" (e.g., travel history, exposures)       No  Protocols used: COUGH - ACUTE NON-PRODUCTIVE-A-AH  Pt having complaints of cough,congestion and runny nose for the past 2 weeks with no improvement. Pt has tried OTC medications to help with cough. Pt states she does have productive cough at times with greenish colored sputum. Pt originally had appt scheduled for Monday, 06/24/17 but due to pt's current symptoms appt scheduled for 12/13 at 2:30pm.

## 2017-06-20 NOTE — Telephone Encounter (Signed)
Pt called and triaged. Appt scheduled for today at 230

## 2017-06-20 NOTE — Progress Notes (Signed)
Chief Complaint  Patient presents with  . Cough   Sick x 2 weeks with head congestion and h/a and ears feel full, productive yellow cough and chest tightness tried Robitussion DM and cough drops, denies fever/chills/body aches. Also coughing causes nausea and feeling hot. Denies h/o asthma/allergies. Great grand daughter was sick. 1st she started with sore throat but this has improved.    Review of Systems  Constitutional: Positive for malaise/fatigue. Negative for chills and fever.  HENT: Negative for sore throat.        +ear fullness   Respiratory: Positive for cough. Negative for shortness of breath.   Cardiovascular: Negative for chest pain.  Gastrointestinal: Positive for nausea.  Endo/Heme/Allergies:       +hot flashes   Past Medical History:  Diagnosis Date  . Allergic rhinitis   . Breast cancer ([place]) 1996   s/p left breast lumpectomy (lymph node dissection - 2/11 positive), chemotherapy  . Breast cancer ([place]) 1995   right breast lumpectomy  . Diabetes mellitus   . Dysphagia   . GERD (gastroesophageal reflux disease)   . H/O ulcer disease    PUD  . Hypercholesterolemia   . Hypothyroidism    multinodular goiter  . Nephrolithiasis   . Scoliosis   . Skin cancer   . SVT (supraventricular tachycardia) ([facility])    Past Surgical History:  Procedure Laterality Date  . ABDOMINAL HYSTERECTOMY     partial  . BREAST BIOPSY Left 09/2012   benign  . BREAST LUMPECTOMY Left 1996   lumpectomy with chemo and rad tx for breast ca (lymph node dissection - 2/11 positive  . BREAST LUMPECTOMY Right 1995   lumpectomy only  . BREAST LUMPECTOMY WITH AXILLARY LYMPH NODE DISSECTION  1996   left  . LUMBAR LAMINECTOMY  3/07   L5-S1   Family History  Problem Relation Age of Onset  . Heart disease Father   . Hypertension Father   . Diabetes Father   . Alzheimer's disease Mother   . Diabetes Sister   . Diabetes Sister   . Diabetes Sister   . Diabetes Sister    Social History    Socioeconomic History  . Marital status: Married    Spouse name: Not on file  . Number of children: Not on file  . Years of education: Not on file  . Highest education level: Not on file  Social Needs  . Financial resource strain: Not on file  . Food insecurity - worry: Not on file  . Food insecurity - inability: Not on file  . Transportation needs - medical: Not on file  . Transportation needs - non-medical: Not on file  Occupational History  . Not on file  Tobacco Use  . Smoking status: Former Smoker    Packs/day: 0.50    Years: 40.00    Pack years: 20.00    Types: Cigarettes    Last attempt to quit: 04/14/2004    Years since quitting: 13.1  . Smokeless tobacco: Never Used  Substance and Sexual Activity  . Alcohol use: No    Alcohol/week: 0.0 oz  . Drug use: No  . Sexual activity: No  Other Topics Concern  . Not on file  Social History Narrative  . Not on file   Current Meds  Medication Sig  . ACCU-CHEK SOFTCLIX LANCETS lancets Check blood sugar twice daily Dx 250.00  . aspirin 81 MG tablet Take 81 mg by mouth 2 (two) times daily.   Marland Kitchen atorvastatin (LIPITOR) 20  MG tablet Take 1 tablet (20 mg total) by mouth daily.  . Biotin (BIOTIN 5000) 5 MG CAPS Take by mouth.  . Blood Glucose Monitoring Suppl (ONE TOUCH ULTRA SYSTEM KIT) w/Device KIT 1 kit by Does not apply route once.  . Blood Glucose Monitoring Suppl (ONE TOUCH ULTRA SYSTEM KIT) w/Device KIT 1 kit by Does not apply route once.  . calcium carbonate 200 MG capsule Take 250 mg by mouth 2 (two) times daily with a meal.  . cholecalciferol (VITAMIN D) 400 UNITS TABS Take by mouth.  . escitalopram (LEXAPRO) 20 MG tablet Take 1 tablet (20 mg total) by mouth daily.  . fish oil-omega-3 fatty acids 1000 MG capsule Take 2 g by mouth daily.  Marland Kitchen levothyroxine (SYNTHROID, LEVOTHROID) 25 MCG tablet TAKE 1 TABLET BY MOUTH DAILY  . losartan (COZAAR) 100 MG tablet Take 1 tablet (100 mg total) by mouth daily.  . metFORMIN  (GLUCOPHAGE) 500 MG tablet Take 2 tablets (1,000 mg total) by mouth 2 (two) times daily.  . metoprolol succinate (TOPROL-XL) 50 MG 24 hr tablet TAKE 1 TABLET  DAILY. TAKE WITH OR IMMEDIATELY FOLLOWING A MEAL.  . Multiple Vitamin (MULTIVITAMIN WITH MINERALS) TABS tablet Take 1 tablet by mouth daily.  . ONE TOUCH ULTRA TEST test strip TEST BLOOD SUGAR TWICE DAILY  . TURMERIC PO Take by mouth daily.    No Known Allergies Recent Results (from the past 2160 hour(s))  HM DIABETES EYE EXAM     Status: None   Collection Time: 04/25/17 12:00 AM  Result Value Ref Range   HM Diabetic Eye Exam No Retinopathy No Retinopathy   Objective  Body mass index is 24.98 kg/m. Wt Readings from Last 3 Encounters:  06/20/17 148 lb 2 oz (67.2 kg)  03/14/17 148 lb 12.8 oz (67.5 kg)  11/14/16 153 lb 3.2 oz (69.5 kg)   Temp Readings from Last 3 Encounters:  06/20/17 98.1 F (36.7 C) (Oral)  03/14/17 97.9 F (36.6 C) (Oral)  11/14/16 97.9 F (36.6 C) (Oral)   BP Readings from Last 3 Encounters:  06/20/17 138/72  03/14/17 128/70  11/14/16 132/70   Pulse Readings from Last 3 Encounters:  06/20/17 (!) 59  03/14/17 (!) 58  11/14/16 (!) 56   96%   Physical Exam  Constitutional: She is oriented to person, place, and time and well-developed, well-nourished, and in no distress.  HENT:  Head: Normocephalic and atraumatic.  Mouth/Throat: Posterior oropharyngeal edema present.  No sinus ttp on exam   Eyes: Conjunctivae are normal. Pupils are equal, round, and reactive to light.  Cardiovascular: Regular rhythm. Bradycardia present.  No murmur heard. Mild bradycardia 59 HR  Pulmonary/Chest: Effort normal and breath sounds normal. She has no wheezes.  Neurological: She is alert and oriented to person, place, and time. Gait normal. Gait normal.  Skin: Skin is warm, dry and intact.  Psychiatric: Mood, memory, affect and judgment normal.  Nursing note and vitals reviewed.  Assessment   1. URI/sinusitis    Plan  1. Cold handout  Continue robitussin, add normal saline nasal, pt declines to use flonase, cough drops, rest, tea with honey/lemon,   Prn Tylenol  Switch benadryl to claritin or zyrtec prnRx doxycycline 100 mg bid x 1 week. Prev. Zpack did not help. Consider Augmentin but will do Doxy  Normal spirometry in the past though CXR c/w COPD changes in the past. Will hold on steroids for now if not better add. No wheezing on exam  Pt does not  feel like needs codeine syrup qhs and reports inhalers are too expensive so does not want to try for cough.    Will do CXR if not feeling better in a few days pt to call back    Pt had flu shot 03/2017  Provider: Dr. Olivia Mackie McLean-[facility]ocuz[facility]

## 2017-06-24 ENCOUNTER — Ambulatory Visit: Payer: PPO | Admitting: Family Medicine

## 2017-06-24 ENCOUNTER — Telehealth: Payer: Self-pay | Admitting: Internal Medicine

## 2017-06-24 NOTE — Telephone Encounter (Signed)
Can we schedule her at 9:30 tomorrow?

## 2017-06-24 NOTE — Telephone Encounter (Signed)
Scheduled

## 2017-06-24 NOTE — Telephone Encounter (Signed)
Ok

## 2017-06-24 NOTE — Telephone Encounter (Signed)
Copied from [place] [id number]. Topic: Appointment Scheduling - Scheduling Inquiry for Clinic >> Jun 24, 2017  3:02 PM Scherrie Gerlach wrote: Reason for CRM: pt states she is not much better, this congestion and sinus issue has been going on 3 wks. Pt states she cannot get anything done, weak and tired.  Prefers to see Dr Nicki Reaper. Saw Dr Aundra Dubin 12/13. Pt was prescribed  doxycycline (VIBRA-TABS) 100 MG tablet and all it does is make her nauseous. Pt reports not taking it anymore due to making her sick.   Pt instructed to call back Monday if not better. And she is not better.  Pt would like appt this afternoon or tomorrow.  [place], [place] - [facility] [phone] (Phone) [phone] (Fax)

## 2017-06-25 ENCOUNTER — Ambulatory Visit: Payer: PPO | Admitting: Internal Medicine

## 2017-07-15 ENCOUNTER — Other Ambulatory Visit: Payer: Self-pay | Admitting: Internal Medicine

## 2017-07-25 ENCOUNTER — Other Ambulatory Visit: Payer: PPO

## 2017-07-25 ENCOUNTER — Telehealth: Payer: Self-pay

## 2017-07-25 NOTE — Telephone Encounter (Signed)
Copied from [place] [phone]. Topic: Quick Communication - Appointment Cancellation >> Jul 25, 2017  8:37 AM Yvette Rack wrote: Patient called to cancel appointment scheduled for 07-25-17. Patient has rescheduled their appointment.    Route to department's PEC pool.

## 2017-07-26 ENCOUNTER — Other Ambulatory Visit (INDEPENDENT_AMBULATORY_CARE_PROVIDER_SITE_OTHER): Payer: PPO

## 2017-07-26 DIAGNOSIS — E119 Type 2 diabetes mellitus without complications: Secondary | ICD-10-CM

## 2017-07-26 DIAGNOSIS — I10 Essential (primary) hypertension: Secondary | ICD-10-CM

## 2017-07-26 DIAGNOSIS — E7800 Pure hypercholesterolemia, unspecified: Secondary | ICD-10-CM | POA: Diagnosis not present

## 2017-07-26 LAB — LIPID PANEL
CHOLESTEROL: 166 mg/dL (ref 0–200)
HDL: 55 mg/dL (ref >39.00)
LDL Cholesterol: 91 mg/dL (ref 0–99)
NonHDL: 111.37
TRIGLYCERIDES: 104 mg/dL (ref 0.0–149.0)
Total CHOL/HDL Ratio: 3
VLDL: 20.8 mg/dL (ref 0.0–40.0)

## 2017-07-26 LAB — BASIC METABOLIC PANEL
BUN: 12 mg/dL (ref 6–23)
CALCIUM: 9.4 mg/dL (ref 8.4–10.5)
CHLORIDE: 98 meq/L (ref 96–112)
CO2: 30 mEq/L (ref 19–32)
CREATININE: 0.62 mg/dL (ref 0.40–1.20)
GFR: 100.48 mL/min (ref >60.00)
Glucose, Bld: 161 mg/dL — ABNORMAL HIGH (ref 70–99)
Potassium: 4.3 mEq/L (ref 3.5–5.1)
Sodium: 133 mEq/L — ABNORMAL LOW (ref 135–145)

## 2017-07-26 LAB — HEPATIC FUNCTION PANEL
ALK PHOS: 64 U/L (ref 39–117)
ALT: 22 U/L (ref 0–35)
AST: 21 U/L (ref 0–37)
Albumin: 4.1 g/dL (ref 3.5–5.2)
BILIRUBIN DIRECT: 0.1 mg/dL (ref 0.0–0.3)
Total Bilirubin: 0.5 mg/dL (ref 0.2–1.2)
Total Protein: 7 g/dL (ref 6.0–8.3)

## 2017-07-26 LAB — HEMOGLOBIN A1C: Hgb A1c MFr Bld: 7.9 % — ABNORMAL HIGH (ref 4.6–6.5)

## 2017-07-29 ENCOUNTER — Encounter: Payer: Self-pay | Admitting: Internal Medicine

## 2017-08-01 ENCOUNTER — Ambulatory Visit (INDEPENDENT_AMBULATORY_CARE_PROVIDER_SITE_OTHER): Payer: PPO | Admitting: Internal Medicine

## 2017-08-01 ENCOUNTER — Encounter: Payer: Self-pay | Admitting: Internal Medicine

## 2017-08-01 VITALS — BP 124/60 | HR 56 | Temp 98.3°F | Resp 18 | Wt 148.6 lb

## 2017-08-01 DIAGNOSIS — E7800 Pure hypercholesterolemia, unspecified: Secondary | ICD-10-CM

## 2017-08-01 DIAGNOSIS — D473 Essential (hemorrhagic) thrombocythemia: Secondary | ICD-10-CM

## 2017-08-01 DIAGNOSIS — E039 Hypothyroidism, unspecified: Secondary | ICD-10-CM | POA: Diagnosis not present

## 2017-08-01 DIAGNOSIS — Z853 Personal history of malignant neoplasm of breast: Secondary | ICD-10-CM | POA: Diagnosis not present

## 2017-08-01 DIAGNOSIS — K219 Gastro-esophageal reflux disease without esophagitis: Secondary | ICD-10-CM | POA: Diagnosis not present

## 2017-08-01 DIAGNOSIS — R0981 Nasal congestion: Secondary | ICD-10-CM

## 2017-08-01 DIAGNOSIS — D75839 Thrombocytosis, unspecified: Secondary | ICD-10-CM

## 2017-08-01 DIAGNOSIS — E119 Type 2 diabetes mellitus without complications: Secondary | ICD-10-CM | POA: Diagnosis not present

## 2017-08-01 DIAGNOSIS — I10 Essential (primary) hypertension: Secondary | ICD-10-CM | POA: Diagnosis not present

## 2017-08-01 DIAGNOSIS — E871 Hypo-osmolality and hyponatremia: Secondary | ICD-10-CM | POA: Diagnosis not present

## 2017-08-01 MED ORDER — AZELASTINE HCL 0.1 % NA SOLN: 1.0000 | Freq: Two times a day (BID) | NASAL | 1 refills | Status: DC

## 2017-08-01 NOTE — Progress Notes (Signed)
Patient ID: Michelle Hawkins, female   DOB: 01/19/1946, 72 y.o.   MRN: [id number]   Subjective:    Patient ID: Michelle Hawkins, female    DOB: 1946/02/12, 72 y.o.   MRN: [id number]  HPI  Patient here for a scheduled follow up.  She reports she has not been previously watching her diet.  Recently has started back.  Plans to get more serious about diet and exercise.  States sugars this week have been averaging 94-101.  Discussed her recent lab results.  Discussed elevated a1c.  Discussed my desire to add medication.  She declines.  Wants to work on diet and exercise.  Discussed importance of getting her sugars under control.  No chest pain.  No sob.  No acid reflux.  No abdominal pain.  Bowels moving.  Does report increased nasal congestion - dripping.  Discussed astelin nasal spray.  No cough or chest congestion.    Past Medical History:  Diagnosis Date  . Allergic rhinitis   . Breast cancer ([place]) 1996   s/p left breast lumpectomy (lymph node dissection - 2/11 positive), chemotherapy  . Breast cancer ([place]) 1995   right breast lumpectomy  . Diabetes mellitus   . Dysphagia   . GERD (gastroesophageal reflux disease)   . H/O ulcer disease    PUD  . Hypercholesterolemia   . Hypothyroidism    multinodular goiter  . Nephrolithiasis   . Scoliosis   . Skin cancer   . SVT (supraventricular tachycardia) (HCC)    Past Surgical History:  Procedure Laterality Date  . ABDOMINAL HYSTERECTOMY     partial  . BREAST BIOPSY Left 09/2012   benign  . BREAST LUMPECTOMY Left 1996   lumpectomy with chemo and rad tx for breast ca (lymph node dissection - 2/11 positive  . BREAST LUMPECTOMY Right 1995   lumpectomy only  . BREAST LUMPECTOMY WITH AXILLARY LYMPH NODE DISSECTION  1996   left  . LUMBAR LAMINECTOMY  3/07   L5-S1   Family History  Problem Relation Age of Onset  . Heart disease Father   . Hypertension Father   . Diabetes Father   . Alzheimer's disease Mother   . Diabetes Sister   . Diabetes  Sister   . Diabetes Sister   . Diabetes Sister    Social History   Socioeconomic History  . Marital status: Married    Spouse name: None  . Number of children: None  . Years of education: None  . Highest education level: None  Social Needs  . Financial resource strain: None  . Food insecurity - worry: None  . Food insecurity - inability: None  . Transportation needs - medical: None  . Transportation needs - non-medical: None  Occupational History  . None  Tobacco Use  . Smoking status: Former Smoker    Packs/day: 0.50    Years: 40.00    Pack years: 20.00    Types: Cigarettes    Last attempt to quit: 04/14/2004    Years since quitting: 13.3  . Smokeless tobacco: Never Used  Substance and Sexual Activity  . Alcohol use: No    Alcohol/week: 0.0 oz  . Drug use: No  . Sexual activity: No  Other Topics Concern  . None  Social History Narrative  . None    Outpatient Encounter Medications as of 08/01/2017  Medication Sig  . ACCU-CHEK SOFTCLIX LANCETS lancets Check blood sugar twice daily Dx 250.00  . aspirin 81 MG tablet Take 81 mg  by mouth 2 (two) times daily.   Marland Kitchen atorvastatin (LIPITOR) 20 MG tablet Take 1 tablet (20 mg total) by mouth daily.  Marland Kitchen azelastine (ASTELIN) 0.1 % nasal spray Place 1 spray into both nostrils 2 (two) times daily. Use in each nostril as directed  . Biotin (BIOTIN 5000) 5 MG CAPS Take by mouth.  . Blood Glucose Monitoring Suppl (ONE TOUCH ULTRA SYSTEM KIT) w/Device KIT 1 kit by Does not apply route once.  . Blood Glucose Monitoring Suppl (ONE TOUCH ULTRA SYSTEM KIT) w/Device KIT 1 kit by Does not apply route once.  . calcium carbonate 200 MG capsule Take 250 mg by mouth 2 (two) times daily with a meal.  . cholecalciferol (VITAMIN D) 400 UNITS TABS Take by mouth.  . doxycycline (VIBRA-TABS) 100 MG tablet Take 1 tablet (100 mg total) by mouth 2 (two) times daily. With food and full glass of water  . escitalopram (LEXAPRO) 20 MG tablet TAKE 1 TABLET BY  MOUTH DAILY  . fish oil-omega-3 fatty acids 1000 MG capsule Take 2 g by mouth daily.  Marland Kitchen levothyroxine (SYNTHROID, LEVOTHROID) 25 MCG tablet Take 1 tablet (25 mcg total) by mouth daily.  Marland Kitchen losartan (COZAAR) 100 MG tablet Take 1 tablet (100 mg total) by mouth daily.  . metFORMIN (GLUCOPHAGE) 500 MG tablet Take 2 tablets (1,000 mg total) by mouth 2 (two) times daily.  . metoprolol succinate (TOPROL-XL) 50 MG 24 hr tablet TAKE 1 TABLET  DAILY. TAKE WITH OR IMMEDIATELY FOLLOWING A MEAL.  . Multiple Vitamin (MULTIVITAMIN WITH MINERALS) TABS tablet Take 1 tablet by mouth daily.  Marland Kitchen omeprazole (PRILOSEC) 40 MG capsule TAKE ONE CAPSULE BY MOUTH ONCE DAILY  . ONE TOUCH ULTRA TEST test strip TEST BLOOD SUGAR TWICE DAILY  . Probiotic Product (PROBIOTIC DAILY PO) Take 1 tablet by mouth.  . TURMERIC PO Take by mouth daily.   . [DISCONTINUED] levothyroxine (SYNTHROID, LEVOTHROID) 25 MCG tablet TAKE 1 TABLET BY MOUTH DAILY   No facility-administered encounter medications on file as of 08/01/2017.     Review of Systems  Constitutional: Negative for appetite change and unexpected weight change.  HENT: Positive for congestion and postnasal drip. Negative for sinus pressure.   Respiratory: Negative for cough, chest tightness and shortness of breath.   Cardiovascular: Negative for chest pain, palpitations and leg swelling.  Gastrointestinal: Negative for abdominal pain, diarrhea, nausea and vomiting.  Genitourinary: Negative for difficulty urinating and dysuria.  Musculoskeletal: Negative for joint swelling and myalgias.  Skin: Negative for color change and rash.  Neurological: Negative for dizziness, light-headedness and headaches.  Psychiatric/Behavioral: Negative for agitation and dysphoric mood.       Objective:     Blood pressure rechecked by me:  134/78  Physical Exam  Constitutional: She appears well-developed and well-nourished. No distress.  HENT:  Nose: Nose normal.  Mouth/Throat: Oropharynx  is clear and moist.  Neck: Neck supple. No thyromegaly present.  Cardiovascular: Normal rate and regular rhythm.  Pulmonary/Chest: Breath sounds normal. No respiratory distress. She has no wheezes.  Abdominal: Soft. Bowel sounds are normal. There is no tenderness.  Musculoskeletal: She exhibits no edema or tenderness.  Lymphadenopathy:    She has no cervical adenopathy.  Skin: No rash noted. No erythema.  Psychiatric: She has a normal mood and affect. Her behavior is normal.    BP 124/60 (BP Location: Right Arm, Patient Position: Sitting, Cuff Size: Normal)   Pulse (!) 56   Temp 98.3 F (36.8 C) (Oral)   Resp  18   Wt 148 lb 9.6 oz (67.4 kg)   SpO2 94%   BMI 25.06 kg/m  Wt Readings from Last 3 Encounters:  08/01/17 148 lb 9.6 oz (67.4 kg)  06/20/17 148 lb 2 oz (67.2 kg)  03/14/17 148 lb 12.8 oz (67.5 kg)     Lab Results  Component Value Date   WBC 9.2 11/06/2016   HGB 13.5 11/06/2016   HCT 41.0 11/06/2016   PLT 350.0 11/06/2016   GLUCOSE 161 (H) 07/26/2017   CHOL 166 07/26/2017   TRIG 104.0 07/26/2017   HDL 55.00 07/26/2017   LDLCALC 91 07/26/2017   ALT 22 07/26/2017   AST 21 07/26/2017   NA 133 (L) 07/26/2017   K 4.3 07/26/2017   CL 98 07/26/2017   CREATININE 0.62 07/26/2017   BUN 12 07/26/2017   CO2 30 07/26/2017   TSH 2.57 03/05/2017   HGBA1C 7.9 (H) 07/26/2017   MICROALBUR 0.8 11/06/2016    Mm Screening Breast Tomo Bilateral  Result Date: 11/27/2016 CLINICAL DATA:  Screening. History of bilateral breast cancer. EXAM: 2D DIGITAL SCREENING BILATERAL MAMMOGRAM WITH CAD AND ADJUNCT TOMO COMPARISON:  Previous exam(s). ACR Breast Density Category c: The breast tissue is heterogeneously dense, which may obscure small masses. FINDINGS: There are no findings suspicious for malignancy. Post operative changes are seen in both breasts. Images were processed with CAD. IMPRESSION: No mammographic evidence of malignancy. A result letter of this screening mammogram will be  mailed directly to the patient. RECOMMENDATION: Screening mammogram in one year. (Code:SM-B-01Y) BI-RADS CATEGORY  1: Negative. Electronically Signed   By: Nolon Nations M.D.   On: 11/27/2016 12:30       Assessment & Plan:   Problem List Items Addressed This Visit    Diabetes mellitus ([place])    Discussed recent elevated a1c.  Discussed diet and exercise.  Discussed the need to get her sugars under better control.  Wanted to add medication.  She declines.  Follow met b and a1c.        Relevant Orders   Hemoglobin N9G   Basic metabolic panel   Microalbumin / creatinine urine ratio   GERD (gastroesophageal reflux disease)    Controlled on omeprazole.        History of breast cancer    Mammogram 11/27/16 - Birads I.       Hypercholesterolemia    On lipitor.  Low cholesterol diet and exercise.  Follow lipid panel and liver function tests.   Lab Results  Component Value Date   CHOL 166 07/26/2017   HDL 55.00 07/26/2017   LDLCALC 91 07/26/2017   TRIG 104.0 07/26/2017   CHOLHDL 3 07/26/2017        Relevant Orders   Lipid panel   Hepatic function panel   Hypertension    Blood pressure under good control.  Continue same medication regimen.  Follow pressures.  Follow metabolic panel.        Hyponatremia    Slightly decreased on recent check, but overall relatively stable.  Follow.       Hypothyroidism    On thyroid replacement.  Follow tsh.        Thrombocytosis ([place])    Follow cbc.  Last count wnl.       Relevant Orders   CBC with Differential/Platelet    Other Visit Diagnoses    Nasal congestion    -  Primary   astelin nasal spray as directed.  follow.  Einar Pheasant, MD

## 2017-08-04 ENCOUNTER — Encounter: Payer: Self-pay | Admitting: Internal Medicine

## 2017-08-04 NOTE — Assessment & Plan Note (Signed)
Follow cbc.  Last count wnl.

## 2017-08-04 NOTE — Assessment & Plan Note (Signed)
Blood pressure under good control.  Continue same medication regimen.  Follow pressures.  Follow metabolic panel.   

## 2017-08-04 NOTE — Assessment & Plan Note (Signed)
On thyroid replacement.  Follow tsh.  

## 2017-08-04 NOTE — Assessment & Plan Note (Signed)
Mammogram 11/27/16 - Birads I.

## 2017-08-04 NOTE — Assessment & Plan Note (Signed)
Controlled on omeprazole.   

## 2017-08-04 NOTE — Assessment & Plan Note (Signed)
Slightly decreased on recent check, but overall relatively stable.  Follow.

## 2017-08-04 NOTE — Assessment & Plan Note (Signed)
On lipitor.  Low cholesterol diet and exercise.  Follow lipid panel and liver function tests.   Lab Results  Component Value Date   CHOL 166 07/26/2017   HDL 55.00 07/26/2017   LDLCALC 91 07/26/2017   TRIG 104.0 07/26/2017   CHOLHDL 3 07/26/2017

## 2017-08-04 NOTE — Assessment & Plan Note (Signed)
Discussed recent elevated a1c.  Discussed diet and exercise.  Discussed the need to get her sugars under better control.  Wanted to add medication.  She declines.  Follow met b and a1c.

## 2017-08-26 ENCOUNTER — Other Ambulatory Visit: Payer: Self-pay | Admitting: Internal Medicine

## 2017-08-27 ENCOUNTER — Other Ambulatory Visit: Payer: Self-pay

## 2017-08-27 ENCOUNTER — Telehealth: Payer: Self-pay | Admitting: Internal Medicine

## 2017-08-27 MED ORDER — LOSARTAN POTASSIUM 100 MG PO TABS: 100.0000 mg | ORAL_TABLET | Freq: Every day | ORAL | 0 refills | Status: DC

## 2017-08-27 MED ORDER — OMEPRAZOLE 40 MG PO CPDR: 40.0000 mg | DELAYED_RELEASE_CAPSULE | Freq: Every day | ORAL | 1 refills | Status: DC

## 2017-08-27 MED ORDER — ATORVASTATIN CALCIUM 20 MG PO TABS: 20.0000 mg | ORAL_TABLET | Freq: Every day | ORAL | 0 refills | Status: DC

## 2017-08-27 MED ORDER — ESCITALOPRAM OXALATE 20 MG PO TABS: 20.0000 mg | ORAL_TABLET | Freq: Every day | ORAL | 1 refills | Status: DC

## 2017-08-27 MED ORDER — METFORMIN HCL 500 MG PO TABS: 1000.0000 mg | ORAL_TABLET | Freq: Two times a day (BID) | ORAL | 0 refills | Status: DC

## 2017-08-27 NOTE — Telephone Encounter (Signed)
Copied from [place]. Topic: Quick Communication - See Telephone Encounter >> Aug 27, 2017 10:17 AM Bea Graff, NT wrote: CRM for notification. See Telephone encounter for: Pt needing new prescriptions for all her medications: Omeprazole 40mg , 90 day supply, Metformin 50mg , 90 day supply, Losartan 100mg , 90 day supply, Escitalopram 20mg , 90 day supply, Atorvastatin 20mg , 90 day supply and Levothyroxine 16mcg, 90 day supply. Requesting all sent to [facility].  08/27/17.

## 2017-08-27 NOTE — Telephone Encounter (Signed)
Pt. Reports [facility] has closed. Medications sent to [facility] as requested.

## 2017-08-29 ENCOUNTER — Other Ambulatory Visit: Payer: Self-pay

## 2017-08-29 MED ORDER — METOPROLOL SUCCINATE ER 50 MG PO TB24: ORAL_TABLET | ORAL | 1 refills | Status: DC

## 2017-08-29 MED ORDER — LEVOTHYROXINE SODIUM 25 MCG PO TABS: 25.0000 ug | ORAL_TABLET | Freq: Every day | ORAL | 1 refills | Status: DC

## 2017-08-30 MED ORDER — LOSARTAN POTASSIUM 100 MG PO TABS: 100.0000 mg | ORAL_TABLET | Freq: Every day | ORAL | 0 refills | Status: DC

## 2017-08-30 MED ORDER — METFORMIN HCL 500 MG PO TABS: 1000.0000 mg | ORAL_TABLET | Freq: Two times a day (BID) | ORAL | 0 refills | Status: DC

## 2017-08-30 MED ORDER — ATORVASTATIN CALCIUM 20 MG PO TABS: 20.0000 mg | ORAL_TABLET | Freq: Every day | ORAL | 0 refills | Status: DC

## 2017-08-30 NOTE — Telephone Encounter (Signed)
[facility] calling, did not receive Atorvastatin, losartan or metformin. Can this be resent? Please advise [phone]

## 2017-08-30 NOTE — Addendum Note (Signed)
Addended by: Dimple Nanas on: 08/30/2017 10:45 AM   Modules accepted: Orders

## 2017-08-30 NOTE — Telephone Encounter (Signed)
Medications resent to [facility].

## 2017-11-01 ENCOUNTER — Other Ambulatory Visit: Payer: Self-pay | Admitting: Internal Medicine

## 2017-11-01 DIAGNOSIS — Z1231 Encounter for screening mammogram for malignant neoplasm of breast: Secondary | ICD-10-CM

## 2017-11-11 ENCOUNTER — Other Ambulatory Visit (INDEPENDENT_AMBULATORY_CARE_PROVIDER_SITE_OTHER): Payer: PPO

## 2017-11-11 ENCOUNTER — Encounter: Payer: Self-pay | Admitting: Internal Medicine

## 2017-11-11 DIAGNOSIS — E7800 Pure hypercholesterolemia, unspecified: Secondary | ICD-10-CM | POA: Diagnosis not present

## 2017-11-11 DIAGNOSIS — D473 Essential (hemorrhagic) thrombocythemia: Secondary | ICD-10-CM

## 2017-11-11 DIAGNOSIS — D75839 Thrombocytosis, unspecified: Secondary | ICD-10-CM

## 2017-11-11 DIAGNOSIS — E119 Type 2 diabetes mellitus without complications: Secondary | ICD-10-CM

## 2017-11-11 LAB — HEPATIC FUNCTION PANEL
ALK PHOS: 80 U/L (ref 39–117)
ALT: 22 U/L (ref 0–35)
AST: 22 U/L (ref 0–37)
Albumin: 4 g/dL (ref 3.5–5.2)
BILIRUBIN DIRECT: 0.1 mg/dL (ref 0.0–0.3)
BILIRUBIN TOTAL: 0.4 mg/dL (ref 0.2–1.2)
Total Protein: 7.2 g/dL (ref 6.0–8.3)

## 2017-11-11 LAB — CBC WITH DIFFERENTIAL/PLATELET
BASOS ABS: 0.1 10*3/uL (ref 0.0–0.1)
Basophils Relative: 1.3 % (ref 0.0–3.0)
EOS ABS: 0.8 10*3/uL — AB (ref 0.0–0.7)
EOS PCT: 7.8 % — AB (ref 0.0–5.0)
HCT: 37.7 % (ref 36.0–46.0)
HEMOGLOBIN: 12.8 g/dL (ref 12.0–15.0)
LYMPHS ABS: 3.2 10*3/uL (ref 0.7–4.0)
LYMPHS PCT: 30.4 % (ref 12.0–46.0)
MCHC: 33.9 g/dL (ref 30.0–36.0)
MCV: 88.2 fl (ref 78.0–100.0)
MONO ABS: 1.2 10*3/uL — AB (ref 0.1–1.0)
MONOS PCT: 10.9 % (ref 3.0–12.0)
NEUTROS PCT: 49.6 % (ref 43.0–77.0)
Neutro Abs: 5.3 10*3/uL (ref 1.4–7.7)
Platelets: 356 10*3/uL (ref 150.0–400.0)
RBC: 4.28 Mil/uL (ref 3.87–5.11)
RDW: 14 % (ref 11.5–15.5)
WBC: 10.6 10*3/uL — AB (ref 4.0–10.5)

## 2017-11-11 LAB — BASIC METABOLIC PANEL
BUN: 12 mg/dL (ref 6–23)
CALCIUM: 9.8 mg/dL (ref 8.4–10.5)
CO2: 29 mEq/L (ref 19–32)
Chloride: 96 mEq/L (ref 96–112)
Creatinine, Ser: 0.62 mg/dL (ref 0.40–1.20)
GFR: 100.39 mL/min (ref >60.00)
GLUCOSE: 141 mg/dL — AB (ref 70–99)
POTASSIUM: 4.9 meq/L (ref 3.5–5.1)
SODIUM: 133 meq/L — AB (ref 135–145)

## 2017-11-11 LAB — MICROALBUMIN / CREATININE URINE RATIO
CREATININE, U: 90.9 mg/dL
MICROALB UR: 0.9 mg/dL (ref 0.0–1.9)
MICROALB/CREAT RATIO: 1 mg/g (ref 0.0–30.0)

## 2017-11-11 LAB — LIPID PANEL
Cholesterol: 161 mg/dL (ref 0–200)
HDL: 54 mg/dL (ref >39.00)
LDL CALC: 86 mg/dL (ref 0–99)
NONHDL: 106.83
Total CHOL/HDL Ratio: 3
Triglycerides: 102 mg/dL (ref 0.0–149.0)
VLDL: 20.4 mg/dL (ref 0.0–40.0)

## 2017-11-11 LAB — HEMOGLOBIN A1C: HEMOGLOBIN A1C: 7.5 % — AB (ref 4.6–6.5)

## 2017-11-12 ENCOUNTER — Ambulatory Visit: Payer: PPO | Admitting: Internal Medicine

## 2017-11-13 ENCOUNTER — Ambulatory Visit (INDEPENDENT_AMBULATORY_CARE_PROVIDER_SITE_OTHER): Payer: PPO | Admitting: Internal Medicine

## 2017-11-13 ENCOUNTER — Encounter: Payer: Self-pay | Admitting: Internal Medicine

## 2017-11-13 VITALS — BP 108/58 | HR 58 | Temp 98.1°F | Ht 64.57 in | Wt 144.8 lb

## 2017-11-13 DIAGNOSIS — E039 Hypothyroidism, unspecified: Secondary | ICD-10-CM

## 2017-11-13 DIAGNOSIS — E7800 Pure hypercholesterolemia, unspecified: Secondary | ICD-10-CM

## 2017-11-13 DIAGNOSIS — D649 Anemia, unspecified: Secondary | ICD-10-CM

## 2017-11-13 DIAGNOSIS — D473 Essential (hemorrhagic) thrombocythemia: Secondary | ICD-10-CM | POA: Diagnosis not present

## 2017-11-13 DIAGNOSIS — D75839 Thrombocytosis, unspecified: Secondary | ICD-10-CM

## 2017-11-13 DIAGNOSIS — H539 Unspecified visual disturbance: Secondary | ICD-10-CM

## 2017-11-13 DIAGNOSIS — E119 Type 2 diabetes mellitus without complications: Secondary | ICD-10-CM | POA: Diagnosis not present

## 2017-11-13 DIAGNOSIS — I10 Essential (primary) hypertension: Secondary | ICD-10-CM

## 2017-11-13 DIAGNOSIS — K219 Gastro-esophageal reflux disease without esophagitis: Secondary | ICD-10-CM

## 2017-11-13 MED ORDER — ZOSTER VAC RECOMB ADJUVANTED 50 MCG/0.5ML IM SUSR: 0.5000 mL | Freq: Once | INTRAMUSCULAR | 0 refills | Status: AC

## 2017-11-13 MED ORDER — GLUCOSE BLOOD VI STRP: ORAL_STRIP | 3 refills | Status: DC

## 2017-11-13 NOTE — Progress Notes (Signed)
Pre visit review using our clinic review tool, if applicable. No additional management support is needed unless otherwise documented below in the visit note. 

## 2017-11-13 NOTE — Progress Notes (Signed)
Patient ID: Michelle Hawkins, female   DOB: 06/15/1946, 72 y.o.   MRN: [id number]   Subjective:    Patient ID: Michelle Hawkins, female    DOB: [birth year], 72 y.o.   MRN: [id number]  HPI  Patient here for a scheduled follow up.  She reports she is doing relatively well.  Trying to stay active.  No chest pain.  No sob.  No acid reflux.  No abdominal pain.  Bowels moving.  No urine change.  She is trying to watch her diet.  Was doing better previously, but plans to get more serious again.  a1c slightly improved.  Discussed lab results.  LDL 86.  Discussed shingrix.  Discussed eye exam.  She request f/u at [facility].    Past Medical History:  Diagnosis Date  . Allergic rhinitis   . Breast cancer ([place]) 1996   s/p left breast lumpectomy (lymph node dissection - 2/11 positive), chemotherapy  . Breast cancer ([place]) 1995   right breast lumpectomy  . Diabetes mellitus   . Dysphagia   . GERD (gastroesophageal reflux disease)   . H/O ulcer disease    PUD  . Hypercholesterolemia   . Hypothyroidism    multinodular goiter  . Nephrolithiasis   . Scoliosis   . Skin cancer   . SVT (supraventricular tachycardia) (HCC)    Past Surgical History:  Procedure Laterality Date  . ABDOMINAL HYSTERECTOMY     partial  . BREAST BIOPSY Left 09/2012   benign  . BREAST LUMPECTOMY Left 1996   lumpectomy with chemo and rad tx for breast ca (lymph node dissection - 2/11 positive  . BREAST LUMPECTOMY Right 1995   lumpectomy only  . BREAST LUMPECTOMY WITH AXILLARY LYMPH NODE DISSECTION  1996   left  . LUMBAR LAMINECTOMY  3/07   L5-S1   Family History  Problem Relation Age of Onset  . Heart disease Father   . Hypertension Father   . Diabetes Father   . Alzheimer's disease Mother   . Diabetes Sister   . Diabetes Sister   . Diabetes Sister   . Diabetes Sister    Social History   Socioeconomic History  . Marital status: Married    Spouse name: Not on file  . Number of children: Not on file  .  Years of education: Not on file  . Highest education level: Not on file  Occupational History  . Not on file  Social Needs  . Financial resource strain: Not on file  . Food insecurity:    Worry: Not on file    Inability: Not on file  . Transportation needs:    Medical: Not on file    Non-medical: Not on file  Tobacco Use  . Smoking status: Former Smoker    Packs/day: 0.50    Years: 40.00    Pack years: 20.00    Types: Cigarettes    Last attempt to quit: 04/14/2004    Years since quitting: 13.6  . Smokeless tobacco: Never Used  Substance and Sexual Activity  . Alcohol use: No    Alcohol/week: 0.0 oz  . Drug use: No  . Sexual activity: Never  Lifestyle  . Physical activity:    Days per week: Not on file    Minutes per session: Not on file  . Stress: Not on file  Relationships  . Social connections:    Talks on phone: Not on file    Gets together: Not on file  Attends religious service: Not on file    Active member of club or organization: Not on file    Attends meetings of clubs or organizations: Not on file    Relationship status: Not on file  Other Topics Concern  . Not on file  Social History Narrative  . Not on file    Outpatient Encounter Medications as of 11/13/2017  Medication Sig  . ACCU-CHEK SOFTCLIX LANCETS lancets Check blood sugar twice daily Dx 250.00  . aspirin 81 MG tablet Take 81 mg by mouth 2 (two) times daily.   Marland Kitchen atorvastatin (LIPITOR) 20 MG tablet Take 1 tablet (20 mg total) by mouth daily.  Marland Kitchen azelastine (ASTELIN) 0.1 % nasal spray Place 1 spray into both nostrils 2 (two) times daily. Use in each nostril as directed  . Biotin (BIOTIN 5000) 5 MG CAPS Take by mouth.  . Blood Glucose Monitoring Suppl (ONE TOUCH ULTRA SYSTEM KIT) w/Device KIT 1 kit by Does not apply route once.  . Blood Glucose Monitoring Suppl (ONE TOUCH ULTRA SYSTEM KIT) w/Device KIT 1 kit by Does not apply route once.  . calcium carbonate 200 MG capsule Take 250 mg by mouth 2  (two) times daily with a meal.  . cholecalciferol (VITAMIN D) 400 UNITS TABS Take by mouth.  . escitalopram (LEXAPRO) 20 MG tablet Take 1 tablet (20 mg total) by mouth daily.  . fish oil-omega-3 fatty acids 1000 MG capsule Take 2 g by mouth daily.  Marland Kitchen glucose blood (ONE TOUCH ULTRA TEST) test strip CHECK BLOOD SUGAR TWICE DAILY  . levothyroxine (SYNTHROID, LEVOTHROID) 25 MCG tablet Take 1 tablet (25 mcg total) by mouth daily.  Marland Kitchen losartan (COZAAR) 100 MG tablet Take 1 tablet (100 mg total) by mouth daily.  . metFORMIN (GLUCOPHAGE) 500 MG tablet Take 2 tablets (1,000 mg total) by mouth 2 (two) times daily.  . metoprolol succinate (TOPROL-XL) 50 MG 24 hr tablet TAKE 1 TABLET  DAILY. TAKE WITH OR IMMEDIATELY FOLLOWING A MEAL.  . Multiple Vitamin (MULTIVITAMIN WITH MINERALS) TABS tablet Take 1 tablet by mouth daily.  Marland Kitchen omeprazole (PRILOSEC) 40 MG capsule Take 1 capsule (40 mg total) by mouth daily.  . Probiotic Product (PROBIOTIC DAILY PO) Take 1 tablet by mouth.  . TURMERIC PO Take by mouth daily.   . [DISCONTINUED] ONE TOUCH ULTRA TEST test strip CHECK BLOOD SUGAR TWICE DAILY  . [EXPIRED] Zoster Vaccine Adjuvanted [facility]) injection Inject 0.5 mLs into the muscle once for 1 dose.  . [DISCONTINUED] doxycycline (VIBRA-TABS) 100 MG tablet Take 1 tablet (100 mg total) by mouth 2 (two) times daily. With food and full glass of water   No facility-administered encounter medications on file as of 11/13/2017.     Review of Systems  Constitutional: Negative for appetite change and unexpected weight change.  HENT: Negative for congestion and sinus pressure.   Respiratory: Negative for cough, chest tightness and shortness of breath.   Cardiovascular: Negative for chest pain, palpitations and leg swelling.  Gastrointestinal: Negative for abdominal pain, diarrhea, nausea and vomiting.  Genitourinary: Negative for difficulty urinating and dysuria.  Musculoskeletal: Negative for joint swelling and myalgias.    Skin: Negative for color change and rash.  Neurological: Negative for dizziness, light-headedness and headaches.  Psychiatric/Behavioral: Negative for agitation and dysphoric mood.       Objective:    Physical Exam  Constitutional: She appears well-developed and well-nourished. No distress.  HENT:  Nose: Nose normal.  Mouth/Throat: Oropharynx is clear and moist.  Neck: Neck  supple. No thyromegaly present.  Cardiovascular: Normal rate and regular rhythm.  Pulmonary/Chest: Breath sounds normal. No respiratory distress. She has no wheezes.  Abdominal: Soft. Bowel sounds are normal. There is no tenderness.  Musculoskeletal: She exhibits no edema or tenderness.  Lymphadenopathy:    She has no cervical adenopathy.  Skin: No rash noted. No erythema.  Psychiatric: She has a normal mood and affect. Her behavior is normal.    BP (!) 108/58   Pulse (!) 58   Temp 98.1 F (36.7 C) (Oral)   Ht 5' 4.57" (1.64 m)   Wt 144 lb 12.8 oz (65.7 kg)   SpO2 97%   BMI 24.42 kg/m  Wt Readings from Last 3 Encounters:  11/13/17 144 lb 12.8 oz (65.7 kg)  08/01/17 148 lb 9.6 oz (67.4 kg)  06/20/17 148 lb 2 oz (67.2 kg)     Lab Results  Component Value Date   WBC 10.6 (H) 11/11/2017   HGB 12.8 11/11/2017   HCT 37.7 11/11/2017   PLT 356.0 11/11/2017   GLUCOSE 141 (H) 11/11/2017   CHOL 161 11/11/2017   TRIG 102.0 11/11/2017   HDL 54.00 11/11/2017   LDLCALC 86 11/11/2017   ALT 22 11/11/2017   AST 22 11/11/2017   NA 133 (L) 11/11/2017   K 4.9 11/11/2017   CL 96 11/11/2017   CREATININE 0.62 11/11/2017   BUN 12 11/11/2017   CO2 29 11/11/2017   TSH 2.57 03/05/2017   HGBA1C 7.5 (H) 11/11/2017   MICROALBUR 0.9 11/11/2017    Mm Screening Breast Tomo Bilateral  Result Date: 11/27/2016 CLINICAL DATA:  Screening. History of bilateral breast cancer. EXAM: 2D DIGITAL SCREENING BILATERAL MAMMOGRAM WITH CAD AND ADJUNCT TOMO COMPARISON:  Previous exam(s). ACR Breast Density Category c: The breast  tissue is heterogeneously dense, which may obscure small masses. FINDINGS: There are no findings suspicious for malignancy. Post operative changes are seen in both breasts. Images were processed with CAD. IMPRESSION: No mammographic evidence of malignancy. A result letter of this screening mammogram will be mailed directly to the patient. RECOMMENDATION: Screening mammogram in one year. (Code:SM-B-01Y) BI-RADS CATEGORY  1: Negative. Electronically Signed   By: Nolon Nations M.D.   On: 11/27/2016 12:30       Assessment & Plan:   Problem List Items Addressed This Visit    Anemia    Follow cbc.       Diabetes mellitus ([place])    a1c slightly improved.  Low carb diet and exercise.  Follow met b and a1c.        Relevant Orders   Ambulatory referral to Ophthalmology   Hemoglobin K5L   Basic metabolic panel   GERD (gastroesophageal reflux disease)    Controlled on omeprazole.        Hypercholesterolemia    On lipitor.  Low cholesterol diet and exercise.  Follow lipid panel and liver function tests.        Relevant Orders   Lipid panel   Hepatic function panel   Hypertension    Blood pressure under good control.  Continue same medication regimen.  Follow pressures.  Follow metabolic panel.        Hypothyroidism    On thyroid replacement.  Follow tsh.       Relevant Orders   TSH   Thrombocytosis (HCC)    Follow cbc.         Other Visit Diagnoses    Vision changes    -  Primary   pt  has noticed some change in her vision.  sees Dr George Ina.  request f/u appt.    Relevant Orders   Ambulatory referral to Ophthalmology       Einar Pheasant, MD

## 2017-11-15 DIAGNOSIS — Z85828 Personal history of other malignant neoplasm of skin: Secondary | ICD-10-CM | POA: Diagnosis not present

## 2017-11-15 DIAGNOSIS — X32XXXA Exposure to sunlight, initial encounter: Secondary | ICD-10-CM | POA: Diagnosis not present

## 2017-11-15 DIAGNOSIS — Z08 Encounter for follow-up examination after completed treatment for malignant neoplasm: Secondary | ICD-10-CM | POA: Diagnosis not present

## 2017-11-15 DIAGNOSIS — L814 Other melanin hyperpigmentation: Secondary | ICD-10-CM | POA: Diagnosis not present

## 2017-11-15 DIAGNOSIS — L821 Other seborrheic keratosis: Secondary | ICD-10-CM | POA: Diagnosis not present

## 2017-11-16 ENCOUNTER — Encounter: Payer: Self-pay | Admitting: Internal Medicine

## 2017-11-16 NOTE — Assessment & Plan Note (Signed)
On lipitor.  Low cholesterol diet and exercise.  Follow lipid panel and liver function tests.   

## 2017-11-16 NOTE — Assessment & Plan Note (Signed)
Follow cbc.  

## 2017-11-16 NOTE — Assessment & Plan Note (Signed)
a1c slightly improved.  Low carb diet and exercise.  Follow met b and a1c.

## 2017-11-16 NOTE — Assessment & Plan Note (Signed)
Blood pressure under good control.  Continue same medication regimen.  Follow pressures.  Follow metabolic panel.   

## 2017-11-16 NOTE — Assessment & Plan Note (Signed)
Controlled on omeprazole.   

## 2017-11-16 NOTE — Assessment & Plan Note (Signed)
On thyroid replacement.  Follow tsh.  

## 2017-11-18 ENCOUNTER — Other Ambulatory Visit: Payer: Self-pay | Admitting: Internal Medicine

## 2017-11-25 ENCOUNTER — Other Ambulatory Visit: Payer: Self-pay | Admitting: Internal Medicine

## 2017-11-28 ENCOUNTER — Ambulatory Visit
Admission: RE | Admit: 2017-11-28 | Discharge: 2017-11-28 | Disposition: A | Discharge status: Home / Self Care | Payer: PPO | Source: Ambulatory Visit | Attending: Internal Medicine | Admitting: Internal Medicine

## 2017-11-28 DIAGNOSIS — Z1231 Encounter for screening mammogram for malignant neoplasm of breast: Secondary | ICD-10-CM | POA: Insufficient documentation

## 2018-02-18 ENCOUNTER — Other Ambulatory Visit: Payer: Self-pay | Admitting: Internal Medicine

## 2018-03-24 ENCOUNTER — Telehealth: Payer: Self-pay | Admitting: Internal Medicine

## 2018-03-24 ENCOUNTER — Other Ambulatory Visit (INDEPENDENT_AMBULATORY_CARE_PROVIDER_SITE_OTHER): Payer: PPO

## 2018-03-24 DIAGNOSIS — E119 Type 2 diabetes mellitus without complications: Secondary | ICD-10-CM | POA: Diagnosis not present

## 2018-03-24 DIAGNOSIS — E039 Hypothyroidism, unspecified: Secondary | ICD-10-CM

## 2018-03-24 DIAGNOSIS — E7800 Pure hypercholesterolemia, unspecified: Secondary | ICD-10-CM | POA: Diagnosis not present

## 2018-03-24 LAB — LIPID PANEL
CHOL/HDL RATIO: 3
Cholesterol: 155 mg/dL (ref 0–200)
HDL: 48.1 mg/dL (ref >39.00)
LDL Cholesterol: 83 mg/dL (ref 0–99)
NonHDL: 107.23
TRIGLYCERIDES: 123 mg/dL (ref 0.0–149.0)
VLDL: 24.6 mg/dL (ref 0.0–40.0)

## 2018-03-24 LAB — HEPATIC FUNCTION PANEL
ALBUMIN: 4.1 g/dL (ref 3.5–5.2)
ALK PHOS: 63 U/L (ref 39–117)
ALT: 27 U/L (ref 0–35)
AST: 26 U/L (ref 0–37)
BILIRUBIN TOTAL: 0.5 mg/dL (ref 0.2–1.2)
Bilirubin, Direct: 0.1 mg/dL (ref 0.0–0.3)
Total Protein: 7.3 g/dL (ref 6.0–8.3)

## 2018-03-24 LAB — BASIC METABOLIC PANEL
BUN: 11 mg/dL (ref 6–23)
CALCIUM: 9.3 mg/dL (ref 8.4–10.5)
CO2: 31 mEq/L (ref 19–32)
Chloride: 97 mEq/L (ref 96–112)
Creatinine, Ser: 0.69 mg/dL (ref 0.40–1.20)
GFR: 88.64 mL/min (ref >60.00)
GLUCOSE: 128 mg/dL — AB (ref 70–99)
POTASSIUM: 4.1 meq/L (ref 3.5–5.1)
SODIUM: 134 meq/L — AB (ref 135–145)

## 2018-03-24 LAB — TSH: TSH: 1.34 u[IU]/mL (ref 0.35–4.50)

## 2018-03-24 LAB — HEMOGLOBIN A1C: HEMOGLOBIN A1C: 7.6 % — AB (ref 4.6–6.5)

## 2018-03-24 NOTE — Telephone Encounter (Signed)
-----   Message from De Hollingshead, [facility] sent at 03/24/2018  2:41 PM EDT ----- Regarding: RE: question Dr. Nicki Reaper,   It looks like our newer SGLT2/GLP1 options are going to be $45/30 day $90/90 day through her insurance. Though I would prefer those in most people, particularly the elderly and their lack of hypoglycemia risk, if she is unable to swing that price it's a moot point.   A sulfonylurea like glipizide would be the most cost effective option at this point. I would warn her of the risk of hypoglycemia and side effect of weight gain, and start with a low dose. I would choose glipizide over glimepiride - as glimepiride is longer acting, there is a higher risk for post-meal hypoglycemia.   I try to avoid pioglitazone in most situations. It can lead to fluid related weight gain, and does have an incidence of new-onset heart failure. I think glipizide would be a preferable generic option.   Catie  ----- Message ----- From: Einar Pheasant, MD Sent: 03/24/2018   1:25 PM EDT To: De Hollingshead, [facility] Subject: question                                       This pt has been very resistant to adding a new medication.  Cost is an issue.  Her a1c keeps creeping up.  What would you suggest would be the next best cost effective medication to add.  She is only on metformin.  Thanks    [employer]

## 2018-03-25 ENCOUNTER — Ambulatory Visit (INDEPENDENT_AMBULATORY_CARE_PROVIDER_SITE_OTHER): Payer: PPO | Admitting: Internal Medicine

## 2018-03-25 ENCOUNTER — Encounter: Payer: Self-pay | Admitting: Internal Medicine

## 2018-03-25 VITALS — BP 136/78 | HR 62 | Temp 98.3°F | Resp 18 | Ht 64.0 in | Wt 146.4 lb

## 2018-03-25 DIAGNOSIS — E039 Hypothyroidism, unspecified: Secondary | ICD-10-CM | POA: Diagnosis not present

## 2018-03-25 DIAGNOSIS — Z0000 Encounter for general adult medical examination without abnormal findings: Secondary | ICD-10-CM | POA: Diagnosis not present

## 2018-03-25 DIAGNOSIS — Z23 Encounter for immunization: Secondary | ICD-10-CM | POA: Diagnosis not present

## 2018-03-25 DIAGNOSIS — K219 Gastro-esophageal reflux disease without esophagitis: Secondary | ICD-10-CM | POA: Diagnosis not present

## 2018-03-25 DIAGNOSIS — D473 Essential (hemorrhagic) thrombocythemia: Secondary | ICD-10-CM

## 2018-03-25 DIAGNOSIS — D649 Anemia, unspecified: Secondary | ICD-10-CM

## 2018-03-25 DIAGNOSIS — E119 Type 2 diabetes mellitus without complications: Secondary | ICD-10-CM | POA: Diagnosis not present

## 2018-03-25 DIAGNOSIS — E7800 Pure hypercholesterolemia, unspecified: Secondary | ICD-10-CM

## 2018-03-25 DIAGNOSIS — I10 Essential (primary) hypertension: Secondary | ICD-10-CM | POA: Diagnosis not present

## 2018-03-25 DIAGNOSIS — E2839 Other primary ovarian failure: Secondary | ICD-10-CM

## 2018-03-25 DIAGNOSIS — D75839 Thrombocytosis, unspecified: Secondary | ICD-10-CM

## 2018-03-25 LAB — HM DIABETES FOOT EXAM

## 2018-03-25 NOTE — Assessment & Plan Note (Signed)
Physical today 03/25/18.  Mammogram 11/28/17 - birads I.  Colonoscopy 10/2012 - normal.  Recommended f/u in 10 years.

## 2018-03-25 NOTE — Progress Notes (Signed)
Patient ID: Michelle Hawkins, female   DOB: 04/07/1946, 72 y.o.   MRN: [id number]   Subjective:    Patient ID: Michelle Hawkins, female    DOB: Jan 31, 1946, 72 y.o.   MRN: [id number]  HPI  Patient here for her physical exam.  She reports she is doing well.  Feels good.  Has not been watching her diet. Not exercising.  Discussed labs.  Discussed elevated a1c.  Discussed adding additional medication.  She declines.  Wants to work on diet and exercise.  Discussed that we had discussed this previously.  Continues to decline.  No chest pain.  No sob.  No acid reflux.  No abdominal pain.  Bowels moving.     Past Medical History:  Diagnosis Date  . Allergic rhinitis   . Breast cancer ([facility]) 1996   s/p left breast lumpectomy (lymph node dissection - 2/11 positive), chemotherapy  . Breast cancer ([facility]) 1995   right breast lumpectomy  . Diabetes mellitus   . Dysphagia   . GERD (gastroesophageal reflux disease)   . H/O ulcer disease    PUD  . Hypercholesterolemia   . Hypothyroidism    multinodular goiter  . Nephrolithiasis   . Scoliosis   . Skin cancer   . SVT (supraventricular tachycardia) ([facility])    Past Surgical History:  Procedure Laterality Date  . ABDOMINAL HYSTERECTOMY     partial  . BREAST BIOPSY Left 09/2012   benign  . BREAST LUMPECTOMY Left 1996   lumpectomy with chemo and rad tx for breast ca (lymph node dissection - 2/11 positive  . BREAST LUMPECTOMY Right 1995   lumpectomy only  . BREAST LUMPECTOMY WITH AXILLARY LYMPH NODE DISSECTION  1996   left  . LUMBAR LAMINECTOMY  3/07   L5-S1   Family History  Problem Relation Age of Onset  . Heart disease Father   . Hypertension Father   . Diabetes Father   . Alzheimer's disease Mother   . Diabetes Sister   . Diabetes Sister   . Diabetes Sister   . Diabetes Sister    Social History   Socioeconomic History  . Marital status: Married    Spouse name: Not on file  . Number of children: Not on file  . Years of education: Not on file   . Highest education level: Not on file  Occupational History  . Not on file  Social Needs  . Financial resource strain: Not on file  . Food insecurity:    Worry: Not on file    Inability: Not on file  . Transportation needs:    Medical: Not on file    Non-medical: Not on file  Tobacco Use  . Smoking status: Former Smoker    Packs/day: 0.50    Years: 40.00    Pack years: 20.00    Types: Cigarettes    Last attempt to quit: 04/14/2004    Years since quitting: 13.9  . Smokeless tobacco: Never Used  Substance and Sexual Activity  . Alcohol use: No    Alcohol/week: 0.0 standard drinks  . Drug use: No  . Sexual activity: Never  Lifestyle  . Physical activity:    Days per week: Not on file    Minutes per session: Not on file  . Stress: Not on file  Relationships  . Social connections:    Talks on phone: Not on file    Gets together: Not on file    Attends religious service: Not on file  Active member of club or organization: Not on file    Attends meetings of clubs or organizations: Not on file    Relationship status: Not on file  Other Topics Concern  . Not on file  Social History Narrative  . Not on file    Outpatient Encounter Medications as of 03/25/2018  Medication Sig  . ACCU-CHEK SOFTCLIX LANCETS lancets Check blood sugar twice daily Dx 250.00  . aspirin 81 MG tablet Take 81 mg by mouth 2 (two) times daily.   Marland Kitchen atorvastatin (LIPITOR) 20 MG tablet TAKE ONE TABLET EVERY DAY  . azelastine (ASTELIN) 0.1 % nasal spray Place 1 spray into both nostrils 2 (two) times daily. Use in each nostril as directed  . Biotin (BIOTIN 5000) 5 MG CAPS Take by mouth.  . Blood Glucose Monitoring Suppl (ONE TOUCH ULTRA SYSTEM KIT) w/Device KIT 1 kit by Does not apply route once.  . Blood Glucose Monitoring Suppl (ONE TOUCH ULTRA SYSTEM KIT) w/Device KIT 1 kit by Does not apply route once.  . calcium carbonate 200 MG capsule Take 250 mg by mouth 2 (two) times daily with a meal.  .  cholecalciferol (VITAMIN D) 400 UNITS TABS Take by mouth.  . escitalopram (LEXAPRO) 20 MG tablet Take 1 tablet (20 mg total) by mouth daily.  . fish oil-omega-3 fatty acids 1000 MG capsule Take 2 g by mouth daily.  Marland Kitchen glucose blood (ONE TOUCH ULTRA TEST) test strip CHECK BLOOD SUGAR TWICE DAILY  . losartan (COZAAR) 100 MG tablet TAKE ONE TABLET EVERY DAY  . metFORMIN (GLUCOPHAGE) 500 MG tablet TAKE TWO TABLETS TWICE DAILY  . metoprolol succinate (TOPROL-XL) 50 MG 24 hr tablet TAKE 1 TABLET BY MOUTH DAILY. TAKE WITH OR IMMEDIATELY FOLLOWING A MEAL.  . Multiple Vitamin (MULTIVITAMIN WITH MINERALS) TABS tablet Take 1 tablet by mouth daily.  Marland Kitchen omeprazole (PRILOSEC) 40 MG capsule Take 1 capsule (40 mg total) by mouth daily.  . Probiotic Product (PROBIOTIC DAILY PO) Take 1 tablet by mouth.  . TURMERIC PO Take by mouth daily.   . [DISCONTINUED] levothyroxine (SYNTHROID, LEVOTHROID) 25 MCG tablet Take 1 tablet (25 mcg total) by mouth daily.   No facility-administered encounter medications on file as of 03/25/2018.     Review of Systems  Constitutional: Negative for appetite change and unexpected weight change.  HENT: Negative for congestion and sinus pressure.   Eyes: Negative for pain and visual disturbance.  Respiratory: Negative for cough, chest tightness and shortness of breath.   Cardiovascular: Negative for chest pain, palpitations and leg swelling.  Gastrointestinal: Negative for abdominal pain, diarrhea, nausea and vomiting.  Genitourinary: Negative for difficulty urinating and dysuria.  Musculoskeletal: Negative for joint swelling and myalgias.  Skin: Negative for color change and rash.  Neurological: Negative for dizziness, light-headedness and headaches.  Hematological: Negative for adenopathy. Does not bruise/bleed easily.  Psychiatric/Behavioral: Negative for agitation and dysphoric mood.       Objective:     Blood pressure rechecked by me:  138/78  Physical Exam    Constitutional: She is oriented to person, place, and time. She appears well-developed and well-nourished. No distress.  HENT:  Nose: Nose normal.  Mouth/Throat: Oropharynx is clear and moist.  Eyes: Right eye exhibits no discharge. Left eye exhibits no discharge. No scleral icterus.  Neck: Neck supple. No thyromegaly present.  Cardiovascular: Normal rate and regular rhythm.  Pulmonary/Chest: Breath sounds normal. No accessory muscle usage. No tachypnea. No respiratory distress. She has no decreased breath sounds. She  has no wheezes. She has no rhonchi. Right breast exhibits no inverted nipple, no mass, no nipple discharge and no tenderness (no axillary adenopathy). Left breast exhibits no inverted nipple, no mass, no nipple discharge and no tenderness (no axilarry adenopathy).  Abdominal: Soft. Bowel sounds are normal. There is no tenderness.  Musculoskeletal: She exhibits no edema or tenderness.  Feet:  No lesions.  DP pulses palpable and equal bilaterally.  Sensation intact to light touch and pin prick.    Lymphadenopathy:    She has no cervical adenopathy.  Neurological: She is alert and oriented to person, place, and time.  Skin: No rash noted. No erythema.  Psychiatric: She has a normal mood and affect. Her behavior is normal.    BP 136/78 (BP Location: Right Arm, Patient Position: Sitting, Cuff Size: Normal)   Pulse 62   Temp 98.3 F (36.8 C) (Oral)   Resp 18   Ht _0  (1.626 m)   Wt 146 lb 6.4 oz (66.4 kg)   SpO2 97%   BMI 25.13 kg/m  Wt Readings from Last 3 Encounters:  03/25/18 146 lb 6.4 oz (66.4 kg)  11/13/17 144 lb 12.8 oz (65.7 kg)  08/01/17 148 lb 9.6 oz (67.4 kg)     Lab Results  Component Value Date   WBC 10.6 (H) 11/11/2017   HGB 12.8 11/11/2017   HCT 37.7 11/11/2017   PLT 356.0 11/11/2017   GLUCOSE 128 (H) 03/24/2018   CHOL 155 03/24/2018   TRIG 123.0 03/24/2018   HDL 48.10 03/24/2018   LDLCALC 83 03/24/2018   ALT 27 03/24/2018   AST 26 03/24/2018    NA 134 (L) 03/24/2018   K 4.1 03/24/2018   CL 97 03/24/2018   CREATININE 0.69 03/24/2018   BUN 11 03/24/2018   CO2 31 03/24/2018   TSH 1.34 03/24/2018   HGBA1C 7.6 (H) 03/24/2018   MICROALBUR 0.9 11/11/2017    Mm 3d Screen Breast Bilateral  Result Date: 11/28/2017 CLINICAL DATA:  Screening. EXAM: DIGITAL SCREENING BILATERAL MAMMOGRAM WITH TOMO AND CAD COMPARISON:  Previous exam(s). ACR Breast Density Category c: The breast tissue is heterogeneously dense, which may obscure small masses. FINDINGS: There are no findings suspicious for malignancy. Images were processed with CAD. IMPRESSION: No mammographic evidence of malignancy. A result letter of this screening mammogram will be mailed directly to the patient. RECOMMENDATION: Screening mammogram in one year. (Code:SM-B-01Y) BI-RADS CATEGORY  1: Negative. Electronically Signed   By: Marin Olp M.D.   On: 11/28/2017 15:44       Assessment & Plan:   Problem List Items Addressed This Visit    Anemia    Follow cbc.  Will check ferritin next lab.  Taking iron.        Relevant Orders   CBC with Differential/Platelet   Ferritin   IBC panel   Diabetes mellitus ([place])    a1c elevated.  Discussed with her today.  She is not watching her diet.  Discussed diet and exercise.  Discussed my desire to add additional medication.  She declines.  Wants to work on diet and exercise.  Follow met b and a1c.  Discussed importance of regular eye exams.        Relevant Orders   Hemoglobin H1T   Basic metabolic panel   GERD (gastroesophageal reflux disease)    Controlled on current regimen.        Health care maintenance    Physical today 03/25/18.  Mammogram 11/28/17 - birads I.  Colonoscopy 10/2012 - normal.  Recommended f/u in 10 years.        Hypercholesterolemia    On lipitor.  Low cholesterol diet and exercise.  Follow lipid panel and liver function tests.        Relevant Orders   Hepatic function panel   Lipid panel   Hypertension     Blood pressure under good control.  Continue same medication regimen.  Follow pressures.  Follow metabolic panel.        Hypothyroidism    On thyroid replacement.  Follow tsh.       Thrombocytosis ([place])    Follow cbc.        Other Visit Diagnoses    Estrogen deficiency    -  Primary   Relevant Orders   DG Bone Density   Need for influenza vaccination       Relevant Orders   Flu vaccine HIGH DOSE PF (Fluzone High dose) (Completed)       Einar Pheasant, MD

## 2018-03-27 ENCOUNTER — Other Ambulatory Visit: Payer: Self-pay | Admitting: Internal Medicine

## 2018-03-30 ENCOUNTER — Encounter: Payer: Self-pay | Admitting: Internal Medicine

## 2018-03-30 NOTE — Assessment & Plan Note (Signed)
Follow cbc.  Will check ferritin next lab.  Taking iron.

## 2018-03-30 NOTE — Assessment & Plan Note (Signed)
Blood pressure under good control.  Continue same medication regimen.  Follow pressures.  Follow metabolic panel.   

## 2018-03-30 NOTE — Assessment & Plan Note (Signed)
On thyroid replacement.  Follow tsh.  

## 2018-03-30 NOTE — Assessment & Plan Note (Signed)
Follow cbc.  

## 2018-03-30 NOTE — Assessment & Plan Note (Signed)
a1c elevated.  Discussed with her today.  She is not watching her diet.  Discussed diet and exercise.  Discussed my desire to add additional medication.  She declines.  Wants to work on diet and exercise.  Follow met b and a1c.  Discussed importance of regular eye exams.

## 2018-03-30 NOTE — Assessment & Plan Note (Signed)
On lipitor.  Low cholesterol diet and exercise.  Follow lipid panel and liver function tests.   

## 2018-03-30 NOTE — Assessment & Plan Note (Signed)
Controlled on current regimen.   

## 2018-04-08 DIAGNOSIS — E7800 Pure hypercholesterolemia, unspecified: Secondary | ICD-10-CM | POA: Diagnosis not present

## 2018-04-08 DIAGNOSIS — R0789 Other chest pain: Secondary | ICD-10-CM | POA: Diagnosis not present

## 2018-04-08 DIAGNOSIS — I4891 Unspecified atrial fibrillation: Secondary | ICD-10-CM | POA: Diagnosis not present

## 2018-04-08 DIAGNOSIS — I10 Essential (primary) hypertension: Secondary | ICD-10-CM | POA: Diagnosis not present

## 2018-04-15 DIAGNOSIS — M8588 Other specified disorders of bone density and structure, other site: Secondary | ICD-10-CM | POA: Diagnosis not present

## 2018-04-15 LAB — HM DEXA SCAN

## 2018-04-22 ENCOUNTER — Other Ambulatory Visit: Payer: Self-pay | Admitting: Internal Medicine

## 2018-04-24 DIAGNOSIS — R0789 Other chest pain: Secondary | ICD-10-CM | POA: Diagnosis not present

## 2018-04-30 DIAGNOSIS — H2513 Age-related nuclear cataract, bilateral: Secondary | ICD-10-CM | POA: Diagnosis not present

## 2018-04-30 LAB — HM DIABETES EYE EXAM

## 2018-05-15 ENCOUNTER — Other Ambulatory Visit: Payer: Self-pay | Admitting: Internal Medicine

## 2018-05-29 ENCOUNTER — Other Ambulatory Visit: Payer: Self-pay | Admitting: Internal Medicine

## 2018-06-03 ENCOUNTER — Telehealth: Payer: Self-pay

## 2018-06-03 NOTE — Telephone Encounter (Signed)
Reviewed sugars.  Overall sugars appear to be doing better.  Continue diet.  Continue to monitor.  We will follow.  Please just clarify with pt why she needs handicap placard.

## 2018-06-03 NOTE — Telephone Encounter (Signed)
Blood sugar readings and handicap renewal placed in quick sign for you

## 2018-06-04 NOTE — Telephone Encounter (Signed)
Signed and place in box.

## 2018-06-04 NOTE — Telephone Encounter (Signed)
Called pt and got message that call could not be completed as dialed

## 2018-06-04 NOTE — Telephone Encounter (Signed)
Patient wants handicap placard because she can not walk long distances. She is aware that you have reviewed her sugar readings and will continue to monitor them

## 2018-06-09 ENCOUNTER — Telehealth: Payer: Self-pay | Admitting: Internal Medicine

## 2018-06-09 ENCOUNTER — Other Ambulatory Visit: Payer: Self-pay | Admitting: Internal Medicine

## 2018-06-09 NOTE — Telephone Encounter (Signed)
Pt needs a new rx on glucose blood (ONE TOUCH ULTRA TEST) test strip sent to [facility]  Pt has been checking it more often and needs new rx

## 2018-06-11 NOTE — Telephone Encounter (Signed)
Tried to call pt, unable to reach

## 2018-06-12 ENCOUNTER — Other Ambulatory Visit: Payer: Self-pay

## 2018-06-12 MED ORDER — GLUCOSE BLOOD VI STRP: ORAL_STRIP | 3 refills | Status: DC

## 2018-06-12 NOTE — Telephone Encounter (Signed)
Increased script to TID, Patient had script that was sent in for BID but was testing more times per day. Spoke with [facility]. They are going to call [employer] for an override and call patient when script is ready for pick up. Advised to [facility] that patient will need rx today.

## 2018-06-26 ENCOUNTER — Other Ambulatory Visit: Payer: Self-pay | Admitting: Internal Medicine

## 2018-07-03 ENCOUNTER — Telehealth: Payer: Self-pay

## 2018-07-03 NOTE — Telephone Encounter (Signed)
Copied from [place] [phone]. Topic: General - Other >> Jul 03, 2018 11:22 AM Vernona Rieger wrote: Reason for CRM: Patient said she missed a call from [place]. She said she is unsure if this was an old message or if she needed to speak with her.

## 2018-07-11 NOTE — Telephone Encounter (Signed)
LMTCB

## 2018-07-15 NOTE — Telephone Encounter (Signed)
Called patient and discussed bone density results.

## 2018-07-16 ENCOUNTER — Encounter: Payer: Self-pay | Admitting: Family

## 2018-07-16 ENCOUNTER — Ambulatory Visit (INDEPENDENT_AMBULATORY_CARE_PROVIDER_SITE_OTHER): Payer: PPO | Admitting: Family

## 2018-07-16 VITALS — BP 126/66 | HR 72 | Temp 98.1°F | Resp 15 | Ht 64.0 in | Wt 144.8 lb

## 2018-07-16 DIAGNOSIS — H1033 Unspecified acute conjunctivitis, bilateral: Secondary | ICD-10-CM

## 2018-07-16 DIAGNOSIS — H66002 Acute suppurative otitis media without spontaneous rupture of ear drum, left ear: Secondary | ICD-10-CM

## 2018-07-16 MED ORDER — AMOXICILLIN-POT CLAVULANATE 875-125 MG PO TABS: 1.0000 | ORAL_TABLET | Freq: Two times a day (BID) | ORAL | 0 refills | Status: AC

## 2018-07-16 MED ORDER — ERYTHROMYCIN 5 MG/GM OP OINT: TOPICAL_OINTMENT | OPHTHALMIC | 0 refills | Status: DC

## 2018-07-16 MED ORDER — BENZONATATE 100 MG PO CAPS: 100.0000 mg | ORAL_CAPSULE | Freq: Two times a day (BID) | ORAL | 0 refills | Status: DC | PRN

## 2018-07-16 NOTE — Patient Instructions (Addendum)
Start augmentin, erthromycin ointment  Ensure to take probiotics while on antibiotics and also for 2 weeks after completion. It is important to re-colonize the gut with good bacteria and also to prevent any diarrheal infections associated with antibiotic use.   Let me know if not better   Otitis Media, Adult  Otitis media means that the middle ear is red and swollen (inflamed) and full of fluid. The condition usually goes away on its own. Follow these instructions at home:  Take over-the-counter and prescription medicines only as told by your doctor.  If you were prescribed an antibiotic medicine, take it as told by your doctor. Do not stop taking the antibiotic even if you start to feel better.  Keep all follow-up visits as told by your doctor. This is important. Contact a doctor if:  You have bleeding from your nose.  There is a lump on your neck.  You are not getting better in 5 days.  You feel worse instead of better. Get help right away if:  You have pain that is not helped with medicine.  You have swelling, redness, or pain around your ear.  You get a stiff neck.  You cannot move part of your face (paralyzed).  You notice that the bone behind your ear hurts when you touch it.  You get a very bad headache. Summary  Otitis media means that the middle ear is red, swollen, and full of fluid.  This condition usually goes away on its own. In some cases, treatment may be needed.  If you were prescribed an antibiotic medicine, take it as told by your doctor. This information is not intended to replace advice given to you by your health care provider. Make sure you discuss any questions you have with your health care provider. Document Released: 12/12/2007 Document Revised: 07/16/2016 Document Reviewed: 07/16/2016 Elsevier Interactive Patient Education  2019 [employer]  Bacterial Conjunctivitis, Adult Bacterial conjunctivitis is an infection of the clear membrane  that covers the white part of your eye and the inner surface of your eyelid (conjunctiva). When the blood vessels in your conjunctiva become inflamed, your eye becomes red or pink, and it will probably feel itchy. Bacterial conjunctivitis spreads very easily from person to person (is contagious). It also spreads easily from one eye to the other eye. What are the causes? This condition is caused by bacteria. You may get the infection if you come into close contact with:  A person who is infected with the bacteria.  Items that are contaminated with the bacteria, such as a face towel, contact lens solution, or eye makeup. What increases the risk? You are more likely to develop this condition if you:  Are exposed to other people who have the infection.  Wear contact lenses.  Have a sinus infection.  Have had a recent eye injury or surgery.  Have a weak body defense system (immune system).  Have a medical condition that causes dry eyes. What are the signs or symptoms? Symptoms of this condition include:  Thick, yellowish discharge from the eye. This may turn into a crust on the eyelid overnight and cause your eyelids to stick together.  Tearing or watery eyes.  Itchy eyes.  Burning feeling in your eyes.  Eye redness.  Swollen eyelids.  Blurred vision. How is this diagnosed? This condition is diagnosed based on your symptoms and medical history. Your health care provider may also take a sample of discharge from your eye to find the cause of  your infection. This is rarely done. How is this treated? This condition may be treated with:  Antibiotic eye drops or ointment to clear the infection more quickly and prevent the spread of infection to others.  Oral antibiotic medicines to treat infections that do not respond to drops or ointments or that last longer than 10 days.  Cool, wet cloths (cool compresses) placed on the eyes.  Artificial tears applied 2-6 times a day. Follow  these instructions at home: Medicines  Take or apply your antibiotic medicine as told by your health care provider. Do not stop taking or applying the antibiotic even if you start to feel better.  Take or apply over-the-counter and prescription medicines only as told by your health care provider.  Be very careful to avoid touching the edge of your eyelid with the eye-drop bottle or the ointment tube when you apply medicines to the affected eye. This will keep you from spreading the infection to your other eye or to other people. Managing discomfort  Gently wipe away any drainage from your eye with a warm, wet washcloth or a cotton ball.  Apply a clean, cool compress to your eye for 10-20 minutes, 3-4 times a day. General instructions  Do not wear contact lenses until the inflammation is gone and your health care provider says it is safe to wear them again. Ask your health care provider how to sterilize or replace your contact lenses before you use them again. Wear glasses until you can resume wearing contact lenses.  Avoid wearing eye makeup until the inflammation is gone. Throw away any old eye cosmetics that may be contaminated.  Change or wash your pillowcase every day.  Do not share towels or washcloths. This may spread the infection.  Wash your hands often with soap and water. Use paper towels to dry your hands.  Avoid touching or rubbing your eyes.  Do not drive or use heavy machinery if your vision is blurred. Contact a health care provider if:  You have a fever.  Your symptoms do not get better after 10 days. Get help right away if you have:  A fever and your symptoms suddenly get worse.  Severe pain when you move your eye.  Facial pain, redness, or swelling.  Sudden loss of vision. Summary  Bacterial conjunctivitis is an infection of the clear membrane that covers the white part of your eye and the inner surface of your eyelid (conjunctiva).  Bacterial  conjunctivitis spreads very easily from person to person (is contagious).  Wash your hands often with soap and water. Use paper towels to dry your hands.  Take or apply your antibiotic medicine as told by your health care provider. Do not stop taking or applying the antibiotic even if you start to feel better.  Contact a health care provider if you have a fever or your symptoms do not get better after 10 days. This information is not intended to replace advice given to you by your health care provider. Make sure you discuss any questions you have with your health care provider. Document Released: 06/25/2005 Document Revised: 01/29/2018 Document Reviewed: 01/29/2018 Elsevier Interactive Patient Education  2019 [employer].

## 2018-07-16 NOTE — Progress Notes (Signed)
Subjective:    Patient ID: Michelle Hawkins, female    DOB: July 11, 1945, 73 y.o.   MRN: [id number]  CC: Michelle Hawkins is a 73 y.o. female who presents today for an acute visit.    HPI: CC: productive cough, one week, unchanged.   Coughing throughout the day. Started mucinex D with loosening of congestion  Facial pain, eye drainage, ear pressure , particularly the left ear.   Tmax 101 a few days ago. Eyes had green pus and stuck together.  No vision changes. Doesn't wear contact lenses.    No sob, cp, wheezing, syncope.   Husband has similar symptoms; here today at visit.   H/o svt Former smoker  HISTORY:  Past Medical History:  Diagnosis Date  . Allergic rhinitis   . Breast cancer ([place]) 1996   s/p left breast lumpectomy (lymph node dissection - 2/11 positive), chemotherapy  . Breast cancer ([place]) 1995   right breast lumpectomy  . Diabetes mellitus   . Dysphagia   . GERD (gastroesophageal reflux disease)   . H/O ulcer disease    PUD  . Hypercholesterolemia   . Hypothyroidism    multinodular goiter  . Nephrolithiasis   . Scoliosis   . Skin cancer   . SVT (supraventricular tachycardia) (HCC)    Past Surgical History:  Procedure Laterality Date  . ABDOMINAL HYSTERECTOMY     partial  . BREAST BIOPSY Left 09/2012   benign  . BREAST LUMPECTOMY Left 1996   lumpectomy with chemo and rad tx for breast ca (lymph node dissection - 2/11 positive  . BREAST LUMPECTOMY Right 1995   lumpectomy only  . BREAST LUMPECTOMY WITH AXILLARY LYMPH NODE DISSECTION  1996   left  . LUMBAR LAMINECTOMY  3/07   L5-S1   Family History  Problem Relation Age of Onset  . Heart disease Father   . Hypertension Father   . Diabetes Father   . Alzheimer's disease Mother   . Diabetes Sister   . Diabetes Sister   . Diabetes Sister   . Diabetes Sister     Allergies: Patient has no known allergies. Current Outpatient Medications on File Prior to Visit  Medication Sig Dispense Refill  . ACCU-CHEK  SOFTCLIX LANCETS lancets Check blood sugar twice daily Dx 250.00 200 each 1  . aspirin 81 MG tablet Take 81 mg by mouth 2 (two) times daily.     Marland Kitchen atorvastatin (LIPITOR) 20 MG tablet TAKE ONE TABLET EVERY DAY 90 tablet 0  . azelastine (ASTELIN) 0.1 % nasal spray Place 1 spray into both nostrils 2 (two) times daily. Use in each nostril as directed 30 mL 1  . Biotin (BIOTIN 5000) 5 MG CAPS Take by mouth.    . Blood Glucose Monitoring Suppl (ONE TOUCH ULTRA SYSTEM KIT) w/Device KIT 1 kit by Does not apply route once. 1 each 0  . Blood Glucose Monitoring Suppl (ONE TOUCH ULTRA SYSTEM KIT) w/Device KIT 1 kit by Does not apply route once.    . calcium carbonate 200 MG capsule Take 250 mg by mouth 2 (two) times daily with a meal.    . cholecalciferol (VITAMIN D) 400 UNITS TABS Take by mouth.    . escitalopram (LEXAPRO) 20 MG tablet TAKE ONE TABLET EVERY DAY 90 tablet 1  . fish oil-omega-3 fatty acids 1000 MG capsule Take 2 g by mouth daily.    Marland Kitchen glucose blood (ONE TOUCH ULTRA TEST) test strip CHECK BLOOD SUGAR THREE TIMES DAILY Dx E11.9  300 each 3  . levothyroxine (SYNTHROID, LEVOTHROID) 25 MCG tablet TAKE 1 TABLET BY MOUTH DAILY 90 tablet 1  . losartan (COZAAR) 100 MG tablet TAKE ONE TABLET EVERY DAY 90 tablet 0  . metFORMIN (GLUCOPHAGE) 500 MG tablet TAKE TWO TABLETS TWICE DAILY 360 tablet 0  . metoprolol succinate (TOPROL-XL) 50 MG 24 hr tablet TAKE ONE TABLET EVERY DAY WITH OR IMMEDIATELY FOLLOWING A MEAL 90 tablet 1  . Multiple Vitamin (MULTIVITAMIN WITH MINERALS) TABS tablet Take 1 tablet by mouth daily.    . omeprazole (PRILOSEC) 40 MG capsule TAKE 1 CAPSULE EVERY DAY 90 capsule 1  . Probiotic Product (PROBIOTIC DAILY PO) Take 1 tablet by mouth.    . TURMERIC PO Take by mouth daily.      No current facility-administered medications on file prior to visit.     Social History   Tobacco Use  . Smoking status: Former Smoker    Packs/day: 0.50    Years: 40.00    Pack years: 20.00    Types:  Cigarettes    Last attempt to quit: 04/14/2004    Years since quitting: 14.2  . Smokeless tobacco: Never Used  Substance Use Topics  . Alcohol use: No    Alcohol/week: 0.0 standard drinks  . Drug use: No    Review of Systems  Constitutional: Negative for chills and fever.  HENT: Positive for congestion, ear pain (left) and sinus pain. Negative for ear discharge, sore throat and trouble swallowing.   Eyes: Positive for discharge and redness. Negative for photophobia, pain, itching and visual disturbance.  Respiratory: Positive for cough. Negative for shortness of breath and wheezing.   Cardiovascular: Negative for chest pain and palpitations.  Gastrointestinal: Negative for nausea and vomiting.      Objective:    BP 126/66 (BP Location: Right Arm, Patient Position: Sitting, Cuff Size: Normal)   Pulse 72   Temp 98.1 F (36.7 C) (Oral)   Resp 15   Ht 5' 4" (1.626 m)   Wt 144 lb 12.8 oz (65.7 kg)   SpO2 96%   BMI 24.85 kg/m    Physical Exam Vitals signs reviewed.  Constitutional:      Appearance: She is well-developed.  HENT:     Head: Normocephalic and atraumatic.     Right Ear: Hearing, tympanic membrane, ear canal and external ear normal. No decreased hearing noted. No drainage, swelling or tenderness. No middle ear effusion. No foreign body. Tympanic membrane is not erythematous or bulging.     Left Ear: Hearing, ear canal and external ear normal. No decreased hearing noted. No drainage, swelling or tenderness. A middle ear effusion is present. No foreign body. Tympanic membrane is erythematous and bulging.     Nose: Nose normal. No rhinorrhea.     Right Sinus: No maxillary sinus tenderness or frontal sinus tenderness.     Left Sinus: No maxillary sinus tenderness or frontal sinus tenderness.     Mouth/Throat:     Pharynx: Uvula midline. No oropharyngeal exudate or posterior oropharyngeal erythema.     Tonsils: No tonsillar abscesses.  Eyes:     General: Lids are  everted, no foreign bodies appreciated. No scleral icterus.       Right eye: No discharge.        Left eye: No discharge or hordeolum.     Conjunctiva/sclera: Conjunctivae normal.     Right eye: Right conjunctiva is not injected. No hemorrhage.    Left eye: Left conjunctiva is not injected.   No hemorrhage.    Pupils: Pupils are equal, round, and reactive to light.     Comments: No external eye lesions. Surrounding skin intact.  Right eye:  Moderate, Diffuse injection of the conjunctiva. Yellow discharge seen on eyelashes.  No white spots, opacity, or foreign body appreciated. No collection of blood or pus in the anterior chamber. No ciliary flush surrounding iris.   Left eye:  Mild, Diffuse injection of the conjunctiva. No white spots, opacity, or foreign body appreciated. No collection of blood or pus in the anterior chamber. No ciliary flush surrounding iris.   No photophobia or eye pain appreciated during exam.   Cardiovascular:     Rate and Rhythm: Regular rhythm.     Pulses: Normal pulses.     Heart sounds: Normal heart sounds.  Pulmonary:     Effort: Pulmonary effort is normal.     Breath sounds: Normal breath sounds. No wheezing, rhonchi or rales.  Lymphadenopathy:     Head:     Right side of head: No submental, submandibular, tonsillar, preauricular, posterior auricular or occipital adenopathy.     Left side of head: No submental, submandibular, tonsillar, preauricular, posterior auricular or occipital adenopathy.     Cervical: No cervical adenopathy.  Skin:    General: Skin is warm and dry.  Neurological:     Mental Status: She is alert.  Psychiatric:        Speech: Speech normal.        Behavior: Behavior normal.        Thought Content: Thought content normal.        Assessment & Plan:  1. Non-recurrent acute suppurative otitis media of left ear without spontaneous rupture of tympanic membrane Afebrile. Non toxic in appearance. She will let me know if not better -  amoxicillin-clavulanate (AUGMENTIN) 875-125 MG tablet; Take 1 tablet by mouth 2 (two) times daily for 7 days.  Dispense: 14 tablet; Refill: 0  2. Acute bacterial conjunctivitis of both eyes Will treat for bacterial conjunctivitis.  - erythromycin ophthalmic ointment; Use one half inch four times daily to affected eye (s) x 7 days.  Dispense: 3.5 g; Refill: 0     I am having Turquoise W. Hawbaker start on amoxicillin-clavulanate, benzonatate, and erythromycin. I am also having her maintain her Biotin, fish oil-omega-3 fatty acids, TURMERIC PO, calcium carbonate, aspirin, cholecalciferol, multivitamin with minerals, ACCU-CHEK SOFTCLIX LANCETS, ONE TOUCH ULTRA SYSTEM KIT, Probiotic Product (PROBIOTIC DAILY PO), ONE TOUCH ULTRA SYSTEM KIT, azelastine, omeprazole, escitalopram, losartan, metFORMIN, atorvastatin, metoprolol succinate, glucose blood, and levothyroxine.   Meds ordered this encounter  Medications  . amoxicillin-clavulanate (AUGMENTIN) 875-125 MG tablet    Sig: Take 1 tablet by mouth 2 (two) times daily for 7 days.    Dispense:  14 tablet    Refill:  0    Order Specific Question:   Supervising Provider    Answer:   TULLO, TERESA L [[id number]]  . benzonatate (TESSALON) 100 MG capsule    Sig: Take 1 capsule (100 mg total) by mouth 2 (two) times daily as needed for cough.    Dispense:  20 capsule    Refill:  0    Order Specific Question:   Supervising Provider    Answer:   TULLO, TERESA L [[id number]]  . erythromycin ophthalmic ointment    Sig: Use one half inch four times daily to affected eye (s) x 7 days.    Dispense:  3.5 g    Refill:  0      Order Specific Question:   Supervising Provider    Answer:   Crecencio Mc [[id number]]    Return precautions given.   Risks, benefits, and alternatives of the medications and treatment plan prescribed today were discussed, and patient expressed understanding.   Education regarding symptom management and diagnosis given to patient on AVS.  Continue to  follow with Einar Pheasant, MD for routine health maintenance.   Michelle Hawkins and I agreed with plan.   Mable Paris, FNP

## 2018-07-23 ENCOUNTER — Other Ambulatory Visit (INDEPENDENT_AMBULATORY_CARE_PROVIDER_SITE_OTHER): Payer: PPO

## 2018-07-23 ENCOUNTER — Other Ambulatory Visit: Payer: Self-pay | Admitting: Internal Medicine

## 2018-07-23 DIAGNOSIS — E7800 Pure hypercholesterolemia, unspecified: Secondary | ICD-10-CM

## 2018-07-23 DIAGNOSIS — E119 Type 2 diabetes mellitus without complications: Secondary | ICD-10-CM | POA: Diagnosis not present

## 2018-07-23 DIAGNOSIS — D649 Anemia, unspecified: Secondary | ICD-10-CM | POA: Diagnosis not present

## 2018-07-23 LAB — CBC WITH DIFFERENTIAL/PLATELET
BASOS ABS: 0.1 10*3/uL (ref 0.0–0.1)
Basophils Relative: 0.9 % (ref 0.0–3.0)
Eosinophils Absolute: 0.8 10*3/uL — ABNORMAL HIGH (ref 0.0–0.7)
Eosinophils Relative: 5.7 % — ABNORMAL HIGH (ref 0.0–5.0)
HCT: 35.6 % — ABNORMAL LOW (ref 36.0–46.0)
Hemoglobin: 11.9 g/dL — ABNORMAL LOW (ref 12.0–15.0)
Lymphocytes Relative: 30.8 % (ref 12.0–46.0)
Lymphs Abs: 4.1 10*3/uL — ABNORMAL HIGH (ref 0.7–4.0)
MCHC: 33.5 g/dL (ref 30.0–36.0)
MCV: 88 fl (ref 78.0–100.0)
Monocytes Absolute: 1.2 10*3/uL — ABNORMAL HIGH (ref 0.1–1.0)
Monocytes Relative: 9.2 % (ref 3.0–12.0)
Neutro Abs: 7.1 10*3/uL (ref 1.4–7.7)
Neutrophils Relative %: 53.4 % (ref 43.0–77.0)
Platelets: 473 10*3/uL — ABNORMAL HIGH (ref 150.0–400.0)
RBC: 4.05 Mil/uL (ref 3.87–5.11)
RDW: 13.4 % (ref 11.5–15.5)
WBC: 13.2 10*3/uL — ABNORMAL HIGH (ref 4.0–10.5)

## 2018-07-23 LAB — BASIC METABOLIC PANEL
BUN: 15 mg/dL (ref 6–23)
CALCIUM: 10 mg/dL (ref 8.4–10.5)
CO2: 30 mEq/L (ref 19–32)
Chloride: 96 mEq/L (ref 96–112)
Creatinine, Ser: 0.73 mg/dL (ref 0.40–1.20)
GFR: 82.99 mL/min (ref >60.00)
Glucose, Bld: 138 mg/dL — ABNORMAL HIGH (ref 70–99)
POTASSIUM: 4.5 meq/L (ref 3.5–5.1)
Sodium: 133 mEq/L — ABNORMAL LOW (ref 135–145)

## 2018-07-23 LAB — HEPATIC FUNCTION PANEL
ALT: 19 U/L (ref 0–35)
AST: 19 U/L (ref 0–37)
Albumin: 3.8 g/dL (ref 3.5–5.2)
Alkaline Phosphatase: 67 U/L (ref 39–117)
BILIRUBIN TOTAL: 0.3 mg/dL (ref 0.2–1.2)
Bilirubin, Direct: 0 mg/dL (ref 0.0–0.3)
Total Protein: 7.3 g/dL (ref 6.0–8.3)

## 2018-07-23 LAB — FERRITIN: Ferritin: 31.5 ng/mL (ref 10.0–291.0)

## 2018-07-23 LAB — LIPID PANEL
Cholesterol: 153 mg/dL (ref 0–200)
HDL: 45.6 mg/dL (ref >39.00)
LDL Cholesterol: 88 mg/dL (ref 0–99)
NONHDL: 107.52
Total CHOL/HDL Ratio: 3
Triglycerides: 97 mg/dL (ref 0.0–149.0)
VLDL: 19.4 mg/dL (ref 0.0–40.0)

## 2018-07-23 LAB — IBC PANEL
Iron: 71 ug/dL (ref 42–145)
SATURATION RATIOS: 17.7 % — AB (ref 20.0–50.0)
TRANSFERRIN: 287 mg/dL (ref 212.0–360.0)

## 2018-07-23 LAB — HEMOGLOBIN A1C: HEMOGLOBIN A1C: 8 % — AB (ref 4.6–6.5)

## 2018-07-25 ENCOUNTER — Ambulatory Visit (INDEPENDENT_AMBULATORY_CARE_PROVIDER_SITE_OTHER): Payer: PPO | Admitting: Internal Medicine

## 2018-07-25 ENCOUNTER — Encounter: Payer: Self-pay | Admitting: Internal Medicine

## 2018-07-25 VITALS — BP 122/60 | HR 50 | Temp 98.4°F | Resp 16 | Wt 147.0 lb

## 2018-07-25 DIAGNOSIS — E7800 Pure hypercholesterolemia, unspecified: Secondary | ICD-10-CM

## 2018-07-25 DIAGNOSIS — I10 Essential (primary) hypertension: Secondary | ICD-10-CM | POA: Diagnosis not present

## 2018-07-25 DIAGNOSIS — E871 Hypo-osmolality and hyponatremia: Secondary | ICD-10-CM | POA: Diagnosis not present

## 2018-07-25 DIAGNOSIS — E039 Hypothyroidism, unspecified: Secondary | ICD-10-CM

## 2018-07-25 DIAGNOSIS — K219 Gastro-esophageal reflux disease without esophagitis: Secondary | ICD-10-CM

## 2018-07-25 DIAGNOSIS — Z853 Personal history of malignant neoplasm of breast: Secondary | ICD-10-CM | POA: Diagnosis not present

## 2018-07-25 DIAGNOSIS — E119 Type 2 diabetes mellitus without complications: Secondary | ICD-10-CM | POA: Diagnosis not present

## 2018-07-25 DIAGNOSIS — D473 Essential (hemorrhagic) thrombocythemia: Secondary | ICD-10-CM

## 2018-07-25 DIAGNOSIS — J329 Chronic sinusitis, unspecified: Secondary | ICD-10-CM | POA: Diagnosis not present

## 2018-07-25 DIAGNOSIS — D649 Anemia, unspecified: Secondary | ICD-10-CM | POA: Diagnosis not present

## 2018-07-25 DIAGNOSIS — D75839 Thrombocytosis, unspecified: Secondary | ICD-10-CM

## 2018-07-25 MED ORDER — EMPAGLIFLOZIN 10 MG PO TABS: 10.0000 mg | ORAL_TABLET | Freq: Every day | ORAL | 2 refills | Status: DC

## 2018-07-25 MED ORDER — AMOXICILLIN-POT CLAVULANATE 875-125 MG PO TABS: 1.0000 | ORAL_TABLET | Freq: Two times a day (BID) | ORAL | 0 refills | Status: DC

## 2018-07-25 NOTE — Progress Notes (Signed)
Patient ID: Michelle Hawkins, female   DOB: [birth year], 73 y.o.   MRN: [id number]   Subjective:    Patient ID: Michelle Hawkins, female    DOB: September 26, 1945, 73 y.o.   MRN: [id number]  HPI  Patient here for a scheduled follow up.  She reports had fever, facial pain cough and congestion.  Was evaluated 07/16/18 as an acute visit.  Diagnosed with OM.  Treated with augmentin.  Left ear stopped up.  Feels like talking in a barrell.  No sinus pressure now. Feels better, but not still with symptoms.  Still with some congestion.  No nausea or vomiting.  No significant cough.  No vomiting.  Bowels moving.  Discussed recent labs.  Elevated a1c.  Discussed treatment options.  She has been resistant to treatment in the past.     Past Medical History:  Diagnosis Date  . Allergic rhinitis   . Breast cancer ([place]) 1996   s/p left breast lumpectomy (lymph node dissection - 2/11 positive), chemotherapy  . Breast cancer ([place]) 1995   right breast lumpectomy  . Diabetes mellitus   . Dysphagia   . GERD (gastroesophageal reflux disease)   . H/O ulcer disease    PUD  . Hypercholesterolemia   . Hypothyroidism    multinodular goiter  . Nephrolithiasis   . Scoliosis   . Skin cancer   . SVT (supraventricular tachycardia) ([facility])    Past Surgical History:  Procedure Laterality Date  . ABDOMINAL HYSTERECTOMY     partial  . BREAST BIOPSY Left 09/2012   benign  . BREAST LUMPECTOMY Left 1996   lumpectomy with chemo and rad tx for breast ca (lymph node dissection - 2/11 positive  . BREAST LUMPECTOMY Right 1995   lumpectomy only  . BREAST LUMPECTOMY WITH AXILLARY LYMPH NODE DISSECTION  1996   left  . LUMBAR LAMINECTOMY  3/07   L5-S1   Family History  Problem Relation Age of Onset  . Heart disease Father   . Hypertension Father   . Diabetes Father   . Alzheimer's disease Mother   . Diabetes Sister   . Diabetes Sister   . Diabetes Sister   . Diabetes Sister    Social History   Socioeconomic History  . Marital  status: Married    Spouse name: Not on file  . Number of children: Not on file  . Years of education: Not on file  . Highest education level: Not on file  Occupational History  . Not on file  Social Needs  . Financial resource strain: Not on file  . Food insecurity:    Worry: Not on file    Inability: Not on file  . Transportation needs:    Medical: Not on file    Non-medical: Not on file  Tobacco Use  . Smoking status: Former Smoker    Packs/day: 0.50    Years: 40.00    Pack years: 20.00    Types: Cigarettes    Last attempt to quit: 04/14/2004    Years since quitting: 14.2  . Smokeless tobacco: Never Used  Substance and Sexual Activity  . Alcohol use: No    Alcohol/week: 0.0 standard drinks  . Drug use: No  . Sexual activity: Never  Lifestyle  . Physical activity:    Days per week: Not on file    Minutes per session: Not on file  . Stress: Not on file  Relationships  . Social connections:    Talks on phone: Not on  file    Gets together: Not on file    Attends religious service: Not on file    Active member of club or organization: Not on file    Attends meetings of clubs or organizations: Not on file    Relationship status: Not on file  Other Topics Concern  . Not on file  Social History Narrative  . Not on file    Outpatient Encounter Medications as of 07/25/2018  Medication Sig  . ACCU-CHEK SOFTCLIX LANCETS lancets Check blood sugar twice daily Dx 250.00  . amoxicillin-clavulanate (AUGMENTIN) 875-125 MG tablet Take 1 tablet by mouth 2 (two) times daily.  Marland Kitchen aspirin 81 MG tablet Take 81 mg by mouth 2 (two) times daily.   Marland Kitchen atorvastatin (LIPITOR) 20 MG tablet TAKE ONE TABLET EVERY DAY  . Biotin (BIOTIN 5000) 5 MG CAPS Take by mouth.  . Blood Glucose Monitoring Suppl (ONE TOUCH ULTRA SYSTEM KIT) w/Device KIT 1 kit by Does not apply route once.  . Blood Glucose Monitoring Suppl (ONE TOUCH ULTRA SYSTEM KIT) w/Device KIT 1 kit by Does not apply route once.  .  calcium carbonate 200 MG capsule Take 250 mg by mouth 2 (two) times daily with a meal.  . cholecalciferol (VITAMIN D) 400 UNITS TABS Take by mouth.  . empagliflozin (JARDIANCE) 10 MG TABS tablet Take 10 mg by mouth daily.  Marland Kitchen erythromycin ophthalmic ointment Use one half inch four times daily to affected eye (s) x 7 days.  Marland Kitchen escitalopram (LEXAPRO) 20 MG tablet TAKE ONE TABLET EVERY DAY  . fish oil-omega-3 fatty acids 1000 MG capsule Take 2 g by mouth daily.  Marland Kitchen glucose blood (ONE TOUCH ULTRA TEST) test strip CHECK BLOOD SUGAR THREE TIMES DAILY Dx E11.9  . levothyroxine (SYNTHROID, LEVOTHROID) 25 MCG tablet TAKE 1 TABLET BY MOUTH DAILY  . losartan (COZAAR) 100 MG tablet TAKE ONE TABLET EVERY DAY  . metFORMIN (GLUCOPHAGE) 500 MG tablet TAKE TWO TABLETS TWICE DAILY  . metoprolol succinate (TOPROL-XL) 50 MG 24 hr tablet TAKE ONE TABLET EVERY DAY WITH OR IMMEDIATELY FOLLOWING A MEAL  . Multiple Vitamin (MULTIVITAMIN WITH MINERALS) TABS tablet Take 1 tablet by mouth daily.  Marland Kitchen omeprazole (PRILOSEC) 40 MG capsule TAKE 1 CAPSULE EVERY DAY  . Probiotic Product (PROBIOTIC DAILY PO) Take 1 tablet by mouth.  . TURMERIC PO Take by mouth daily.   . [DISCONTINUED] azelastine (ASTELIN) 0.1 % nasal spray Place 1 spray into both nostrils 2 (two) times daily. Use in each nostril as directed  . [DISCONTINUED] benzonatate (TESSALON) 100 MG capsule Take 1 capsule (100 mg total) by mouth 2 (two) times daily as needed for cough.   No facility-administered encounter medications on file as of 07/25/2018.     Review of Systems  Constitutional: Negative for appetite change, fever and unexpected weight change.  HENT: Positive for congestion and sinus pressure.   Respiratory: Negative for cough, chest tightness and shortness of breath.   Cardiovascular: Negative for chest pain, palpitations and leg swelling.  Gastrointestinal: Negative for abdominal pain, diarrhea, nausea and vomiting.  Genitourinary: Negative for  difficulty urinating and dysuria.  Musculoskeletal: Negative for joint swelling and myalgias.  Skin: Negative for color change and rash.  Neurological: Negative for dizziness, light-headedness and headaches.  Psychiatric/Behavioral: Negative for agitation and dysphoric mood.       Objective:    Physical Exam Constitutional:      General: She is not in acute distress.    Appearance: Normal appearance.  HENT:  Ears:     Comments: TMs without erythema.  No cerumen impaction.     Nose: No congestion.     Comments: Nares:  Slightly erythematous turbinates.      Mouth/Throat:     Pharynx: No oropharyngeal exudate or posterior oropharyngeal erythema.  Neck:     Musculoskeletal: Neck supple. No muscular tenderness.     Thyroid: No thyromegaly.  Cardiovascular:     Rate and Rhythm: Normal rate and regular rhythm.  Pulmonary:     Effort: No respiratory distress.     Breath sounds: Normal breath sounds. No wheezing.  Abdominal:     General: Bowel sounds are normal.     Palpations: Abdomen is soft.     Tenderness: There is no abdominal tenderness.  Musculoskeletal:        General: No swelling or tenderness.  Lymphadenopathy:     Cervical: No cervical adenopathy.  Skin:    Findings: No erythema or rash.  Neurological:     Mental Status: She is alert.  Psychiatric:        Mood and Affect: Mood normal.        Behavior: Behavior normal.     BP 122/60 (BP Location: Left Arm, Patient Position: Sitting, Cuff Size: Normal)   Pulse (!) 50   Temp 98.4 F (36.9 C) (Oral)   Resp 16   Wt 147 lb (66.7 kg)   SpO2 96%   BMI 25.23 kg/m  Wt Readings from Last 3 Encounters:  07/25/18 147 lb (66.7 kg)  07/16/18 144 lb 12.8 oz (65.7 kg)  03/25/18 146 lb 6.4 oz (66.4 kg)     Lab Results  Component Value Date   WBC 13.2 (H) 07/23/2018   HGB 11.9 (L) 07/23/2018   HCT 35.6 (L) 07/23/2018   PLT 473.0 (H) 07/23/2018   GLUCOSE 138 (H) 07/23/2018   CHOL 153 07/23/2018   TRIG 97.0  07/23/2018   HDL 45.60 07/23/2018   LDLCALC 88 07/23/2018   ALT 19 07/23/2018   AST 19 07/23/2018   NA 133 (L) 07/23/2018   K 4.5 07/23/2018   CL 96 07/23/2018   CREATININE 0.73 07/23/2018   BUN 15 07/23/2018   CO2 30 07/23/2018   TSH 1.34 03/24/2018   HGBA1C 8.0 (H) 07/23/2018   MICROALBUR 0.9 11/11/2017    Mm 3d Screen Breast Bilateral  Result Date: 11/28/2017 CLINICAL DATA:  Screening. EXAM: DIGITAL SCREENING BILATERAL MAMMOGRAM WITH TOMO AND CAD COMPARISON:  Previous exam(s). ACR Breast Density Category c: The breast tissue is heterogeneously dense, which may obscure small masses. FINDINGS: There are no findings suspicious for malignancy. Images were processed with CAD. IMPRESSION: No mammographic evidence of malignancy. A result letter of this screening mammogram will be mailed directly to the patient. RECOMMENDATION: Screening mammogram in one year. (Code:SM-B-01Y) BI-RADS CATEGORY  1: Negative. Electronically Signed   By: Marin Olp M.D.   On: 11/28/2017 15:44       Assessment & Plan:   Problem List Items Addressed This Visit    Anemia - Primary   Relevant Orders   Ferritin   Vitamin B12   IBC panel   Diabetes mellitus ([place])    Sugar elevated.  a1c 8.0.  Discussed with her today.  Discussed low carb diet and exercise.  Start jardiance 93m q day.  Follow sugars.  Check and record sugars and send in over the next few weeks.  Schedule appt for f/u with Catie.  Follow metabolic panel and aA2N  Relevant Medications   empagliflozin (JARDIANCE) 10 MG TABS tablet   GERD (gastroesophageal reflux disease)    Controlled on current regimen.  Follow.        History of breast cancer    Mammogram 11/28/17 - Birads I.       Hypercholesterolemia    On lipitor.  Low cholesterol diet and exercise.  Follow lipid panel and liver function tests.        Hypertension    Blood pressure under good control.  Continue same medication regimen.  Follow pressures.  Follow metabolic  panel.        Hyponatremia    Sodium stable.  Follow.        Hypothyroidism    On thyroid replacement.  Follow tsh.       Sinusitis    Symptoms better, but not resolved.  Extend out augmentin for another week.  Saline nasal spray and nasacort nasal spray as directed.  Follow.        Relevant Medications   amoxicillin-clavulanate (AUGMENTIN) 875-125 MG tablet   Thrombocytosis ([facility])    Slightly increased.  White count increased on recent check.  Being treated.  Recheck cbc to confirm normal.        Relevant Orders   CBC with Differential/Platelet       Einar Pheasant, MD

## 2018-07-27 ENCOUNTER — Encounter: Payer: Self-pay | Admitting: Internal Medicine

## 2018-07-27 DIAGNOSIS — J329 Chronic sinusitis, unspecified: Secondary | ICD-10-CM | POA: Insufficient documentation

## 2018-07-27 NOTE — Assessment & Plan Note (Signed)
Symptoms better, but not resolved.  Extend out augmentin for another week.  Saline nasal spray and nasacort nasal spray as directed.  Follow.

## 2018-07-27 NOTE — Assessment & Plan Note (Signed)
Slightly increased.  White count increased on recent check.  Being treated.  Recheck cbc to confirm normal.

## 2018-07-27 NOTE — Assessment & Plan Note (Signed)
Blood pressure under good control.  Continue same medication regimen.  Follow pressures.  Follow metabolic panel.   

## 2018-07-27 NOTE — Assessment & Plan Note (Signed)
Mammogram 11/28/17 - Birads I.

## 2018-07-27 NOTE — Assessment & Plan Note (Signed)
Controlled on current regimen.  Follow.  

## 2018-07-27 NOTE — Assessment & Plan Note (Signed)
On thyroid replacement.  Follow tsh.  

## 2018-07-27 NOTE — Assessment & Plan Note (Signed)
Sodium stable.  Follow.   

## 2018-07-27 NOTE — Assessment & Plan Note (Signed)
On lipitor.  Low cholesterol diet and exercise.  Follow lipid panel and liver function tests.   

## 2018-07-27 NOTE — Assessment & Plan Note (Signed)
Sugar elevated.  a1c 8.0.  Discussed with her today.  Discussed low carb diet and exercise.  Start jardiance 10mg  q day.  Follow sugars.  Check and record sugars and send in over the next few weeks.  Schedule appt for f/u with Catie.  Follow metabolic panel and W8S.

## 2018-08-18 ENCOUNTER — Other Ambulatory Visit (INDEPENDENT_AMBULATORY_CARE_PROVIDER_SITE_OTHER): Payer: PPO

## 2018-08-18 ENCOUNTER — Encounter: Payer: Self-pay | Admitting: Pharmacist

## 2018-08-18 ENCOUNTER — Ambulatory Visit (INDEPENDENT_AMBULATORY_CARE_PROVIDER_SITE_OTHER): Payer: PPO | Admitting: Pharmacist

## 2018-08-18 VITALS — BP 117/70 | HR 52 | Wt 145.7 lb

## 2018-08-18 DIAGNOSIS — D473 Essential (hemorrhagic) thrombocythemia: Secondary | ICD-10-CM | POA: Diagnosis not present

## 2018-08-18 DIAGNOSIS — D649 Anemia, unspecified: Secondary | ICD-10-CM | POA: Diagnosis not present

## 2018-08-18 DIAGNOSIS — E119 Type 2 diabetes mellitus without complications: Secondary | ICD-10-CM | POA: Diagnosis not present

## 2018-08-18 DIAGNOSIS — D75839 Thrombocytosis, unspecified: Secondary | ICD-10-CM

## 2018-08-18 DIAGNOSIS — I10 Essential (primary) hypertension: Secondary | ICD-10-CM

## 2018-08-18 DIAGNOSIS — E7800 Pure hypercholesterolemia, unspecified: Secondary | ICD-10-CM

## 2018-08-18 LAB — CBC WITH DIFFERENTIAL/PLATELET
BASOS ABS: 0.1 10*3/uL (ref 0.0–0.1)
Basophils Relative: 1 % (ref 0.0–3.0)
Eosinophils Absolute: 0.8 10*3/uL — ABNORMAL HIGH (ref 0.0–0.7)
Eosinophils Relative: 7.5 % — ABNORMAL HIGH (ref 0.0–5.0)
HCT: 37.8 % (ref 36.0–46.0)
Hemoglobin: 12.5 g/dL (ref 12.0–15.0)
Lymphocytes Relative: 27.9 % (ref 12.0–46.0)
Lymphs Abs: 2.9 10*3/uL (ref 0.7–4.0)
MCHC: 33 g/dL (ref 30.0–36.0)
MCV: 89 fl (ref 78.0–100.0)
MONOS PCT: 8.8 % (ref 3.0–12.0)
Monocytes Absolute: 0.9 10*3/uL (ref 0.1–1.0)
NEUTROS ABS: 5.7 10*3/uL (ref 1.4–7.7)
Neutrophils Relative %: 54.8 % (ref 43.0–77.0)
Platelets: 344 10*3/uL (ref 150.0–400.0)
RBC: 4.24 Mil/uL (ref 3.87–5.11)
RDW: 13.6 % (ref 11.5–15.5)
WBC: 10.4 10*3/uL (ref 4.0–10.5)

## 2018-08-18 LAB — FERRITIN: Ferritin: 12.6 ng/mL (ref 10.0–291.0)

## 2018-08-18 LAB — VITAMIN B12: Vitamin B-12: 1150 pg/mL — ABNORMAL HIGH (ref 211–911)

## 2018-08-18 LAB — IBC PANEL
Iron: 59 ug/dL (ref 42–145)
Saturation Ratios: 12.7 % — ABNORMAL LOW (ref 20.0–50.0)
Transferrin: 331 mg/dL (ref 212.0–360.0)

## 2018-08-18 MED ORDER — METFORMIN HCL ER 500 MG PO TB24: 1000.0000 mg | ORAL_TABLET | Freq: Two times a day (BID) | ORAL | 3 refills | Status: DC

## 2018-08-18 NOTE — Assessment & Plan Note (Signed)
#  ASCVD risk - primary prevention in patient aged 74-75 with DM; patient well controlled on moderate intensity statin - Continue atorvastatin 20 mg daily.

## 2018-08-18 NOTE — Assessment & Plan Note (Signed)
#  Diabetes - Currently uncontrolled, though improved per SMBG results, most recent A1c 8/0% on 07/23/2018, worsened from 7.6% on 03/24/2018; Goal A1c <7%. - Educated patient on true hypoglycemia, encouraged her to not be too concerned about sugars <100. Educated that she is not on any hypoglycemia-causing medications. Reassured that her blood sugar control at this time is excellent.  - Changed to extended release metformin, in case some stomach upset is attributed to this medication.  - Continue Jardiance 10 mg daily. Encouraged to continue to remain well hydrated.  - Next A1C anticipated 10/22/2018.

## 2018-08-18 NOTE — Progress Notes (Addendum)
S:     Chief Complaint  Patient presents with  . Medication Management    Diabetes    Patient arrives in good spirits, ambulating without assistance.  Presents for diabetes evaluation, education, and management at the request of Dr. Nicki Reaper at last appointment on 07/25/2018- at that time, Vania Rea was initiated.  She notes that she has been tolerating the Jardiance well. Denies any genitourinary infections. She does note some stomach upset and a general "dizzy" feeling occasionally, but it is not consistently when moving from sitting to standing or sitting up too quickly. She does note that she is concerned about "low blood sugars", and that she worries when her sugars are <80. She will eat a glucose tablet if her sugars prior to bed are <100. She notes that she is extremely concerned about low blood sugars d/t a family history of her sisters having severe hypoglycemia, though she notes they are on insulin therapy.   Patient reports Diabetes was diagnosed at least 20 years ago.   Insurance coverage/medication affordability: [employer] Advantage Plan 1  Patient reports adherence with medications.  Current diabetes medications include: metformin 1000 mg BID, Jardiance 10 mg daily Current hypertension medications include: losartan 100 mg daily, metoprolol succinate 50 mg daily Current hyperlipidemia medications include: atorvastatin 20 mg daily, omega-3-fatty acids 2 g daily   Patient reports hypoglycemic s/sx including hands shaking when BG is in the 70s-80s. She notes she has NEVER seen an episode of blood sugar less than 70.  Patient reported dietary habits: Eats 2-3 meals/day Breakfast: 1 & 1/2 small bagel with honey; sometimes sausage/eggs or bacon/eggs; occasionally cereal; water then coffee Lunch: Light meal; yogurt, cottage cheese doubles; few crackers; occasionally piece of cheese; occasionally snacks on peanuts  Dinner: Varies, goes out to eat; eats leftovers; avoids breads    Drinks: Water, at least 1 diet pepsi, caffeine free   Patient reported exercise habits: Has not been doing a lot a recently d/t being sick, reports issues with her leg    O:  Physical Exam Constitutional:      Appearance: Normal appearance. She is normal weight.  Neurological:     Mental Status: She is alert.    Review of Systems  All other systems reviewed and are negative.  Lab Results  Component Value Date   HGBA1C 8.0 (H) 07/23/2018   Vitals:   08/18/18 1020  BP: 117/70  Pulse: (!) 52    Basic Metabolic Panel BMP Latest Ref Rng & Units 07/23/2018 03/24/2018 11/11/2017  Glucose 70 - 99 mg/dL 138(H) 128(H) 141(H)  BUN 6 - 23 mg/dL 15 11 12   Creatinine 0.40 - 1.20 mg/dL 0.73 0.69 0.62  Sodium 135 - 145 mEq/L 133(L) 134(L) 133(L)  Potassium 3.5 - 5.1 mEq/L 4.5 4.1 4.9  Chloride 96 - 112 mEq/L 96 97 96  CO2 19 - 32 mEq/L 30 31 29   Calcium 8.4 - 10.5 mg/dL 10.0 9.3 9.8    Lipid Panel     Component Value Date/Time   CHOL 153 07/23/2018 0829   TRIG 97.0 07/23/2018 0829   HDL 45.60 07/23/2018 0829   CHOLHDL 3 07/23/2018 0829   VLDL 19.4 07/23/2018 0829   LDLCALC 88 07/23/2018 0829    Fasting SMBG: 90s-130s 2 hour post-prandial/random SMBG: All <180  Clinical ASCVD: No  The 10-year ASCVD risk score Mikey Bussing DC Jr., et al., 2013) is: 25.5%   Values used to calculate the score:     Age: 73 years  Sex: Female     Is Non-Hispanic African American: No     Diabetic: Yes     Tobacco smoker: No     Systolic Blood Pressure: 815 mmHg     Is BP treated: Yes     HDL Cholesterol: 45.6 mg/dL     Total Cholesterol: 153 mg/dL    A/P: #Diabetes - Currently uncontrolled, though improved per SMBG results, most recent A1c 8/0% on 07/23/2018, worsened from 7.6% on 03/24/2018; Goal A1c <7%. - Educated patient on true hypoglycemia, encouraged her to not be too concerned about sugars <100. Educated that she is not on any hypoglycemia-causing medications. Reassured that her blood  sugar control at this time is excellent.  - Changed to extended release metformin, in case some stomach upset is attributed to this medication.  - Continue Jardiance 10 mg daily. Encouraged to continue to remain well hydrated.  - Next A1C anticipated 10/22/2018.   #Hypertension currently controlled. Goal BP <140/90. Denies s/sx orthostatic hypotension. - Continue losartan 100 mg daily and metoprolol succinate 50 mg daily. Moving forward, continue to monitor HR and consider dose reduction of metoprolol if it remains <60 moving forward.   #ASCVD risk - primary prevention in patient aged 47-75 with DM; patient well controlled on moderate intensity statin - Continue atorvastatin 20 mg daily.  Written patient instructions provided.  Total time in face to face counseling 30 minutes.    Follow up in Pharmacist Clinic Visit 4-6 weeks.  De Hollingshead, PharmD, CPP PGY2 Ambulatory Care Pharmacy Resident Phone: [phone]   Reviewed above information.  Agree with assessment and plan.    Dr Nicki Reaper

## 2018-08-18 NOTE — Patient Instructions (Addendum)
It was great to see you today!  We are not going to make any immediate changes, since your sugar readings are excellent. I will send in a prescription for the extended release metformin, but you can use up the immediate release metformin that you have at home. Continue Jardiance 10 mg daily. Make sure you are staying well hydrated.  Continue checking your blood sugar: 1) Fasting, before eating breakfast. The goal fasting blood sugar is less than 130 mg/dL 2) 2 hours after the largest meal of your day. The goal 2-hour after meal blood sugar is less than 180 mg/dL  Next time you need calcium, try picking up calcium citrate instead of calcium carbonate. Carbonate may not be completed absorbed with the omeprazole.    Please feel free to contact clinic with any questions or concerns. Schedule follow up with me in ~4-6 weeks.   Catie Darnelle Maffucci, PharmD

## 2018-08-18 NOTE — Assessment & Plan Note (Signed)
#  Hypertension currently controlled. Goal BP <140/90. Denies s/sx orthostatic hypotension. - Continue losartan 100 mg daily and metoprolol succinate 50 mg daily. Moving forward, continue to monitor HR and consider dose reduction of metoprolol if it remains <60 moving forward.

## 2018-08-19 ENCOUNTER — Encounter: Payer: Self-pay | Admitting: Internal Medicine

## 2018-08-21 ENCOUNTER — Other Ambulatory Visit: Payer: Self-pay | Admitting: Internal Medicine

## 2018-08-30 ENCOUNTER — Other Ambulatory Visit: Payer: Self-pay | Admitting: Internal Medicine

## 2018-09-16 DIAGNOSIS — M25561 Pain in right knee: Secondary | ICD-10-CM | POA: Diagnosis not present

## 2018-09-20 ENCOUNTER — Encounter: Payer: Self-pay | Admitting: Pharmacist

## 2018-09-22 ENCOUNTER — Ambulatory Visit: Payer: PPO | Admitting: Pharmacist

## 2018-09-22 ENCOUNTER — Telehealth: Payer: Self-pay | Admitting: Pharmacist

## 2018-09-22 NOTE — Telephone Encounter (Signed)
  S:     Chief Complaint  Patient presents with  . Medication Management    Diabetes    Patient contacted by telephone for diabets for diabetes evaluation, education, and management at the request of Dr. Nicki Reaper at last appointment on 07/25/2018- at that time, Vania Rea was initiated. Last seen in Pharmacy Clinic on 08/18/2018.  Today, she reports that she is extremely pleased with her blood sugars. She denies any s/sx genitourinary infections or dehydration, and notes that she is remaining well hydrated. She notes that she switched to extended release metformin last week, and denies "feeling any different".   Insurance coverage/medication affordability: [employer] Advantage Plan 1  Patient reports adherence with medications.  Current diabetes medications include: metformin XR 1000 mg BID, Jardiance 10 mg daily Current hypertension medications include: losartan 100 mg daily, metoprolol succinate 50 mg daily Current hyperlipidemic medications include: atorvastatin 20 mg daily, omega-3-fatty acids  Patient denies hypoglycemic s/sx. Denies any blood sugars <80, and only had 1 blood sugar <90.   O:   Lab Results  Component Value Date   HGBA1C 8.0 (H) 07/23/2018     Basic Metabolic Panel BMP Latest Ref Rng & Units 07/23/2018 03/24/2018 11/11/2017  Glucose 70 - 99 mg/dL 138(H) 128(H) 141(H)  BUN 6 - 23 mg/dL 15 11 12   Creatinine 0.40 - 1.20 mg/dL 0.73 0.69 0.62  Sodium 135 - 145 mEq/L 133(L) 134(L) 133(L)  Potassium 3.5 - 5.1 mEq/L 4.5 4.1 4.9  Chloride 96 - 112 mEq/L 96 97 96  CO2 19 - 32 mEq/L 30 31 29   Calcium 8.4 - 10.5 mg/dL 10.0 9.3 9.8     Lipid Panel     Component Value Date/Time   CHOL 153 07/23/2018 0829   TRIG 97.0 07/23/2018 0829   HDL 45.60 07/23/2018 0829   CHOLHDL 3 07/23/2018 0829   VLDL 19.4 07/23/2018 0829   LDLCALC 88 07/23/2018 0829    Self-Monitored Blood Glucose Results Fasting SMBG: 87-139, though 139 was outlier, typically no higher than 110. 2 hour  post-prandial/random SMBG: 120-130s  Clinical ASCVD: No  The 10-year ASCVD risk score Mikey Bussing DC Jr., et al., 2013) is: 25.5%   Values used to calculate the score:     Age: 73 years     Sex: Female     Is Non-Hispanic African American: No     Diabetic: Yes     Tobacco smoker: No     Systolic Blood Pressure: 700 mmHg     Is BP treated: Yes     HDL Cholesterol: 45.6 mg/dL     Total Cholesterol: 153 mg/dL    A/P: #Diabetes - Currently controlled per SMBG results. Goal A1c <7%. Patient reports tolerability of current regimen.  - Continue metformin XR 1000 mg BID and Jardiance 10 mg daily. Encouraged hydration and continued focus on appropriate dietary choices. - Next A1C anticipated 10/2018. Patient has f/u scheduled with PCP Dr. Nicki Reaper on 11/05/2018.   Total time in telephonic counseling 10 minutes.    Follow up in PCP visit in 1 month, Pharmacy Clinic as needed pending A1c results.   De Hollingshead, PharmD, [place] PGY2 Ambulatory Care Pharmacy Resident Phone: [phone]

## 2018-09-22 NOTE — Telephone Encounter (Signed)
Reviewed information.  Agree with assessment and plan.    Dr Lipa Knauff 

## 2018-10-22 ENCOUNTER — Other Ambulatory Visit: Payer: Self-pay | Admitting: Internal Medicine

## 2018-10-28 ENCOUNTER — Other Ambulatory Visit: Payer: Self-pay | Admitting: Internal Medicine

## 2018-10-28 ENCOUNTER — Telehealth: Payer: Self-pay | Admitting: Internal Medicine

## 2018-10-28 DIAGNOSIS — Z1231 Encounter for screening mammogram for malignant neoplasm of breast: Secondary | ICD-10-CM

## 2018-10-28 DIAGNOSIS — E119 Type 2 diabetes mellitus without complications: Secondary | ICD-10-CM

## 2018-10-28 DIAGNOSIS — I10 Essential (primary) hypertension: Secondary | ICD-10-CM

## 2018-10-28 DIAGNOSIS — D649 Anemia, unspecified: Secondary | ICD-10-CM

## 2018-10-28 DIAGNOSIS — E7800 Pure hypercholesterolemia, unspecified: Secondary | ICD-10-CM

## 2018-10-28 NOTE — Telephone Encounter (Signed)
Pt would like to have labs done before appt on 04/29. Order needed please and thank you!

## 2018-10-28 NOTE — Telephone Encounter (Signed)
Fasting labs scheduled.  

## 2018-10-28 NOTE — Telephone Encounter (Signed)
Ok to schedule lab appt.  I have ordered fasting labs.  (last labs were just f/u cbc and iron).  Thanks

## 2018-10-28 NOTE — Telephone Encounter (Signed)
Last labs were done in 08/2018. Does she need labs yet?

## 2018-10-31 ENCOUNTER — Other Ambulatory Visit (INDEPENDENT_AMBULATORY_CARE_PROVIDER_SITE_OTHER): Payer: PPO

## 2018-10-31 ENCOUNTER — Other Ambulatory Visit: Payer: Self-pay

## 2018-10-31 DIAGNOSIS — E7800 Pure hypercholesterolemia, unspecified: Secondary | ICD-10-CM | POA: Diagnosis not present

## 2018-10-31 DIAGNOSIS — D649 Anemia, unspecified: Secondary | ICD-10-CM | POA: Diagnosis not present

## 2018-10-31 DIAGNOSIS — E119 Type 2 diabetes mellitus without complications: Secondary | ICD-10-CM

## 2018-10-31 LAB — HEPATIC FUNCTION PANEL
ALT: 21 U/L (ref 0–35)
AST: 19 U/L (ref 0–37)
Albumin: 4.3 g/dL (ref 3.5–5.2)
Alkaline Phosphatase: 68 U/L (ref 39–117)
Bilirubin, Direct: 0.1 mg/dL (ref 0.0–0.3)
Total Bilirubin: 0.4 mg/dL (ref 0.2–1.2)
Total Protein: 7 g/dL (ref 6.0–8.3)

## 2018-10-31 LAB — CBC WITH DIFFERENTIAL/PLATELET
Basophils Absolute: 0.1 10*3/uL (ref 0.0–0.1)
Basophils Relative: 1.1 % (ref 0.0–3.0)
Eosinophils Absolute: 0.6 10*3/uL (ref 0.0–0.7)
Eosinophils Relative: 6 % — ABNORMAL HIGH (ref 0.0–5.0)
HCT: 39.6 % (ref 36.0–46.0)
Hemoglobin: 13.1 g/dL (ref 12.0–15.0)
Lymphocytes Relative: 28.5 % (ref 12.0–46.0)
Lymphs Abs: 2.9 10*3/uL (ref 0.7–4.0)
MCHC: 33.2 g/dL (ref 30.0–36.0)
MCV: 88 fl (ref 78.0–100.0)
Monocytes Absolute: 1 10*3/uL (ref 0.1–1.0)
Monocytes Relative: 10.1 % (ref 3.0–12.0)
Neutro Abs: 5.6 10*3/uL (ref 1.4–7.7)
Neutrophils Relative %: 54.3 % (ref 43.0–77.0)
Platelets: 374 10*3/uL (ref 150.0–400.0)
RBC: 4.5 Mil/uL (ref 3.87–5.11)
RDW: 14.3 % (ref 11.5–15.5)
WBC: 10.3 10*3/uL (ref 4.0–10.5)

## 2018-10-31 LAB — BASIC METABOLIC PANEL
BUN: 21 mg/dL (ref 6–23)
CO2: 30 mEq/L (ref 19–32)
Calcium: 9.8 mg/dL (ref 8.4–10.5)
Chloride: 97 mEq/L (ref 96–112)
Creatinine, Ser: 0.74 mg/dL (ref 0.40–1.20)
GFR: 76.8 mL/min (ref >60.00)
Glucose, Bld: 112 mg/dL — ABNORMAL HIGH (ref 70–99)
Potassium: 4.9 mEq/L (ref 3.5–5.1)
Sodium: 135 mEq/L (ref 135–145)

## 2018-10-31 LAB — MICROALBUMIN / CREATININE URINE RATIO
Creatinine,U: 150.2 mg/dL
Microalb Creat Ratio: 1.2 mg/g (ref 0.0–30.0)
Microalb, Ur: 1.9 mg/dL (ref 0.0–1.9)

## 2018-10-31 LAB — LIPID PANEL
Cholesterol: 149 mg/dL (ref 0–200)
HDL: 60.3 mg/dL (ref >39.00)
LDL Cholesterol: 75 mg/dL (ref 0–99)
NonHDL: 89.06
Total CHOL/HDL Ratio: 2
Triglycerides: 72 mg/dL (ref 0.0–149.0)
VLDL: 14.4 mg/dL (ref 0.0–40.0)

## 2018-10-31 LAB — FERRITIN: Ferritin: 12.1 ng/mL (ref 10.0–291.0)

## 2018-10-31 LAB — HEMOGLOBIN A1C: Hgb A1c MFr Bld: 7.3 % — ABNORMAL HIGH (ref 4.6–6.5)

## 2018-11-02 ENCOUNTER — Encounter: Payer: Self-pay | Admitting: Internal Medicine

## 2018-11-05 ENCOUNTER — Encounter: Payer: Self-pay | Admitting: Internal Medicine

## 2018-11-05 ENCOUNTER — Other Ambulatory Visit: Payer: Self-pay

## 2018-11-05 ENCOUNTER — Ambulatory Visit (INDEPENDENT_AMBULATORY_CARE_PROVIDER_SITE_OTHER): Payer: PPO | Admitting: Internal Medicine

## 2018-11-05 DIAGNOSIS — E119 Type 2 diabetes mellitus without complications: Secondary | ICD-10-CM

## 2018-11-05 DIAGNOSIS — E039 Hypothyroidism, unspecified: Secondary | ICD-10-CM

## 2018-11-05 DIAGNOSIS — E7800 Pure hypercholesterolemia, unspecified: Secondary | ICD-10-CM | POA: Diagnosis not present

## 2018-11-05 DIAGNOSIS — D473 Essential (hemorrhagic) thrombocythemia: Secondary | ICD-10-CM | POA: Diagnosis not present

## 2018-11-05 DIAGNOSIS — K219 Gastro-esophageal reflux disease without esophagitis: Secondary | ICD-10-CM

## 2018-11-05 DIAGNOSIS — D649 Anemia, unspecified: Secondary | ICD-10-CM | POA: Diagnosis not present

## 2018-11-05 DIAGNOSIS — I10 Essential (primary) hypertension: Secondary | ICD-10-CM

## 2018-11-05 DIAGNOSIS — D75839 Thrombocytosis, unspecified: Secondary | ICD-10-CM

## 2018-11-05 NOTE — Progress Notes (Signed)
Patient ID: Michelle Hawkins, female   DOB: 04/27/1946, 73 y.o.   MRN: [id number] Virtual Visit via Video Note  This visit type was conducted due to national recommendations for restrictions regarding the COVID-19 pandemic (e.g. social distancing).  This format is felt to be most appropriate for this patient at this time.  All issues noted in this document were discussed and addressed.  No physical exam was performed (except for noted visual exam findings with Video Visits).   I connected with Benetta Spar on 11/05/18 at 10:00 AM EDT by a video enabled telemedicine application and verified that I am speaking with the correct person using two identifiers. Location patient: home Location provider: work Persons participating in the virtual visit: patient, provider  I discussed the limitations, risks, security and privacy concerns of performing an evaluation and management service by video and the availability of in person appointments. The patient expressed understanding and agreed to proceed.   Reason for visit: scheduled follow up.   HPI: She reports she is doing well.  Staying in.  No known COVID exposure.  No fever.  No cough or chest congestion.  Does have some allergies.  Controlling.  Does not feel needs anything more at this time.  Doing well on her current medication regimen.  Sugars much improved.  Outside readings reviewed.  AM sugars averaging 100-120 and pm sugars averaging 100-130.  Trying to watch her diet.  Trying to stay active.  Has low weight.  Weight 135 pounds.  Feels better.  No chest pain. No sob. No acid reflux.  No abdominal pain.  Bowels moving.  No urine change.  Discussed recent labs.  a1c decreased 7.3.  LDL 75.  S/p right knee injection.  Doing better.  Overall feels good.  Handling stress.    ROS: See pertinent positives and negatives per HPI.  Past Medical History:  Diagnosis Date  . Allergic rhinitis   . Breast cancer ([place]) 1996   s/p left breast lumpectomy (lymph node  dissection - 2/11 positive), chemotherapy  . Breast cancer ([place]) 1995   right breast lumpectomy  . Diabetes mellitus   . Dysphagia   . GERD (gastroesophageal reflux disease)   . H/O ulcer disease    PUD  . Hypercholesterolemia   . Hypothyroidism    multinodular goiter  . Nephrolithiasis   . Scoliosis   . Skin cancer   . SVT (supraventricular tachycardia) (HCC)     Past Surgical History:  Procedure Laterality Date  . ABDOMINAL HYSTERECTOMY     partial  . BREAST BIOPSY Left 09/2012   benign  . BREAST LUMPECTOMY Left 1996   lumpectomy with chemo and rad tx for breast ca (lymph node dissection - 2/11 positive  . BREAST LUMPECTOMY Right 1995   lumpectomy only  . BREAST LUMPECTOMY WITH AXILLARY LYMPH NODE DISSECTION  1996   left  . LUMBAR LAMINECTOMY  3/07   L5-S1    Family History  Problem Relation Age of Onset  . Heart disease Father   . Hypertension Father   . Diabetes Father   . Alzheimer's disease Mother   . Diabetes Sister   . Diabetes Sister   . Diabetes Sister   . Diabetes Sister     SOCIAL HX: reviewed.   Current Outpatient Medications:  .  ACCU-CHEK SOFTCLIX LANCETS lancets, Check blood sugar twice daily Dx 250.00, Disp: 200 each, Rfl: 1 .  aspirin 81 MG tablet, Take 81 mg by mouth 2 (two) times daily. ,  Disp: , Rfl:  .  atorvastatin (LIPITOR) 20 MG tablet, TAKE ONE TABLET EVERY DAY, Disp: 90 tablet, Rfl: 0 .  Biotin (BIOTIN 5000) 5 MG CAPS, Take by mouth., Disp: , Rfl:  .  Blood Glucose Monitoring Suppl (ONE TOUCH ULTRA SYSTEM KIT) w/Device KIT, 1 kit by Does not apply route once., Disp: , Rfl:  .  calcium citrate (CALCITRATE - DOSED IN MG ELEMENTAL CALCIUM) 950 MG tablet, Take 200 mg of elemental calcium by mouth daily., Disp: , Rfl:  .  cholecalciferol (VITAMIN D) 400 UNITS TABS, Take by mouth., Disp: , Rfl:  .  escitalopram (LEXAPRO) 20 MG tablet, TAKE ONE TABLET EVERY DAY, Disp: 90 tablet, Rfl: 1 .  fish oil-omega-3 fatty acids 1000 MG capsule, Take 2  g by mouth daily., Disp: , Rfl:  .  glucose blood (ONE TOUCH ULTRA TEST) test strip, CHECK BLOOD SUGAR THREE TIMES DAILY Dx E11.9, Disp: 300 each, Rfl: 3 .  JARDIANCE 10 MG TABS tablet, TAKE ONE TABLET EVERY DAY, Disp: 90 tablet, Rfl: 1 .  levothyroxine (SYNTHROID, LEVOTHROID) 25 MCG tablet, TAKE 1 TABLET BY MOUTH DAILY, Disp: 90 tablet, Rfl: 1 .  losartan (COZAAR) 100 MG tablet, TAKE ONE TABLET EVERY DAY, Disp: 90 tablet, Rfl: 0 .  metFORMIN (GLUCOPHAGE XR) 500 MG 24 hr tablet, Take 2 tablets (1,000 mg total) by mouth 2 (two) times daily., Disp: 360 tablet, Rfl: 3 .  metoprolol succinate (TOPROL-XL) 50 MG 24 hr tablet, TAKE ONE TABLET EVERY DAY WITH OR IMMEDIATELY FOLLOWING A MEAL, Disp: 90 tablet, Rfl: 1 .  Multiple Vitamin (MULTIVITAMIN WITH MINERALS) TABS tablet, Take 1 tablet by mouth daily., Disp: , Rfl:  .  omeprazole (PRILOSEC) 40 MG capsule, TAKE 1 CAPSULE EVERY DAY, Disp: 90 capsule, Rfl: 1 .  Probiotic Product (PROBIOTIC DAILY PO), Take 1 tablet by mouth., Disp: , Rfl:  .  TURMERIC PO, Take by mouth daily. , Disp: , Rfl:   EXAM:  VITALS per patient if applicable:  Weight 480 pounds.   GENERAL: alert, oriented, appears well and in no acute distress  HEENT: atraumatic, conjunttiva clear, no obvious abnormalities on inspection of external nose and ears  NECK: normal movements of the head and neck  LUNGS: on inspection no signs of respiratory distress, breathing rate appears normal, no obvious gross SOB, gasping or wheezing  CV: no obvious cyanosis  PSYCH/NEURO: pleasant and cooperative, no obvious depression or anxiety, speech and thought processing grossly intact  ASSESSMENT AND PLAN:  Discussed the following assessment and plan:  Anemia, unspecified type  Type 2 diabetes mellitus without complication, without long-term current use of insulin (HCC)  Gastroesophageal reflux disease without esophagitis  Hypercholesterolemia  Essential hypertension  Hypothyroidism,  unspecified type  Thrombocytosis (HCC)  Anemia Follow cbc.  Hgb just checked 13.1.    Diabetes mellitus Doing well on current regimen.  Sugars improved.  Has adjusted diet.  Follow met b and a1c.    GERD (gastroesophageal reflux disease) Controlled on current medication regimen.    Hypercholesterolemia Continue lipitor.  Low cholesterol diet and exercise.  Follow lipid panel and liver function tests.    Hypertension Blood pressure has been well controlled.  Continue current medication regimen.  Follow pressures.  Follow metabolic panel.   Hypothyroidism On thyroid replacement.  Follow tsh.   Thrombocytosis Platelet count just checked wnl.  Follow.      I discussed the assessment and treatment plan with the patient. The patient was provided an opportunity to ask questions  and all were answered. The patient agreed with the plan and demonstrated an understanding of the instructions.   The patient was advised to call back or seek an in-person evaluation if the symptoms worsen or if the condition fails to improve as anticipated.    Einar Pheasant, MD

## 2018-11-09 ENCOUNTER — Encounter: Payer: Self-pay | Admitting: Internal Medicine

## 2018-11-09 NOTE — Assessment & Plan Note (Signed)
Follow cbc.  Hgb just checked 13.1.

## 2018-11-09 NOTE — Assessment & Plan Note (Signed)
On thyroid replacement.  Follow tsh.  

## 2018-11-09 NOTE — Assessment & Plan Note (Signed)
Continue lipitor.  Low cholesterol diet and exercise.  Follow lipid panel and liver function tests.   

## 2018-11-09 NOTE — Assessment & Plan Note (Signed)
Controlled on current medication regimen.

## 2018-11-09 NOTE — Assessment & Plan Note (Signed)
Doing well on current regimen.  Sugars improved.  Has adjusted diet.  Follow met b and a1c.

## 2018-11-09 NOTE — Assessment & Plan Note (Signed)
Blood pressure has been well controlled.  Continue current medication regimen.  Follow pressures.  Follow metabolic panel.

## 2018-11-09 NOTE — Assessment & Plan Note (Signed)
Platelet count just checked - wnl.  Follow.  

## 2018-11-19 ENCOUNTER — Other Ambulatory Visit: Payer: Self-pay | Admitting: Internal Medicine

## 2018-12-01 ENCOUNTER — Other Ambulatory Visit: Payer: Self-pay | Admitting: Internal Medicine

## 2018-12-10 ENCOUNTER — Other Ambulatory Visit: Payer: Self-pay | Admitting: Internal Medicine

## 2018-12-22 ENCOUNTER — Other Ambulatory Visit: Payer: Self-pay | Admitting: Internal Medicine

## 2018-12-22 ENCOUNTER — Ambulatory Visit
Admission: RE | Admit: 2018-12-22 | Discharge: 2018-12-22 | Disposition: A | Discharge status: Home / Self Care | Payer: PPO | Source: Ambulatory Visit | Attending: Internal Medicine | Admitting: Internal Medicine

## 2018-12-22 ENCOUNTER — Other Ambulatory Visit: Payer: Self-pay

## 2018-12-22 DIAGNOSIS — N6489 Other specified disorders of breast: Secondary | ICD-10-CM

## 2018-12-22 DIAGNOSIS — R928 Other abnormal and inconclusive findings on diagnostic imaging of breast: Secondary | ICD-10-CM

## 2018-12-22 DIAGNOSIS — Z1231 Encounter for screening mammogram for malignant neoplasm of breast: Secondary | ICD-10-CM | POA: Insufficient documentation

## 2018-12-26 ENCOUNTER — Other Ambulatory Visit: Payer: Self-pay | Admitting: Internal Medicine

## 2018-12-31 ENCOUNTER — Other Ambulatory Visit: Payer: Self-pay

## 2018-12-31 ENCOUNTER — Ambulatory Visit
Admission: RE | Admit: 2018-12-31 | Discharge: 2018-12-31 | Disposition: A | Discharge status: Home / Self Care | Payer: PPO | Source: Ambulatory Visit | Attending: Internal Medicine | Admitting: Internal Medicine

## 2018-12-31 DIAGNOSIS — R928 Other abnormal and inconclusive findings on diagnostic imaging of breast: Secondary | ICD-10-CM | POA: Diagnosis not present

## 2018-12-31 DIAGNOSIS — N6489 Other specified disorders of breast: Secondary | ICD-10-CM | POA: Insufficient documentation

## 2018-12-31 DIAGNOSIS — N6459 Other signs and symptoms in breast: Secondary | ICD-10-CM | POA: Diagnosis not present

## 2018-12-31 DIAGNOSIS — R922 Inconclusive mammogram: Secondary | ICD-10-CM | POA: Diagnosis not present

## 2019-01-15 ENCOUNTER — Ambulatory Visit: Payer: Self-pay | Admitting: Pharmacist

## 2019-01-15 DIAGNOSIS — E7800 Pure hypercholesterolemia, unspecified: Secondary | ICD-10-CM

## 2019-01-15 DIAGNOSIS — K219 Gastro-esophageal reflux disease without esophagitis: Secondary | ICD-10-CM

## 2019-01-15 DIAGNOSIS — E119 Type 2 diabetes mellitus without complications: Secondary | ICD-10-CM

## 2019-01-15 DIAGNOSIS — I10 Essential (primary) hypertension: Secondary | ICD-10-CM

## 2019-01-15 DIAGNOSIS — Z853 Personal history of malignant neoplasm of breast: Secondary | ICD-10-CM

## 2019-01-15 NOTE — Progress Notes (Addendum)
  Chronic Care Management   Note  01/15/2019 Name: Michelle Hawkins MRN: [id number] DOB: July 18, 1945  Attempted to contact patient to discuss chronic care management services. Left HIPAA compliant message for her to return my call at her convenience.    Follow up plan: - If I do not hear back, will attempt outreach again in the next 2-4 weeks  Catie Darnelle Maffucci, PharmD, [place] Pharmacist [place] [phone]  Reviewed above information.    Dr Nicki Reaper

## 2019-02-02 ENCOUNTER — Ambulatory Visit: Payer: PPO | Admitting: Pharmacist

## 2019-02-02 DIAGNOSIS — E039 Hypothyroidism, unspecified: Secondary | ICD-10-CM

## 2019-02-02 DIAGNOSIS — E119 Type 2 diabetes mellitus without complications: Secondary | ICD-10-CM

## 2019-02-02 NOTE — Patient Instructions (Signed)
Visit Information  Goals Addressed            This Visit's Progress     Patient Stated   . "I want to stay in good health" (pt-stated)       Current Barriers:  . Diabetes: uncontrolled though improved; most recent A1c 7.3%  . Current antihyperglycemic regimen: metformin XR 1000 mg BID, Jardiance 10 mg daily.  o Denies any s/sx genitourinary infections with Jardiance, though she does note hair loss somewhat coinciding with the start of Jardiance therapy. No listed incidence of this in Jardiance labeling. She also notes that her sisters have hair loss due to metformin. She notes much anxiety about changes in her physical appearance with age (wrinkles, etc). Does note that she hasn't had any hair loss in the past 3 days.  o Notes ~ 10 lbs weight loss; last office weight was 145, but notes that her weight is ~133 lbs recently. Her personal goal weight is 135 lbs. o Also notes cost concerns with Jardiance, and is worried about going into the Medicare Coverage Gap  . Current blood glucose readings: fasting 100s-110s, bedtime readings 80-110s; notes that she takes 1 glucose tablets before bed if reading is in the 80s.  . Cardiovascular risk reduction: o Current hypertensive regimen: losartan 100 mg daily, metoprolol succinate 50 mg daily; BP well controlled <130/80 at most recent office visits o Current hyperlipidemia regimen: atorvastatin 20 mg daily, omega 3 fatty acids 2 g daily; LDL well controlled at 75, TG well controlled at 72 on last check  Pharmacist Clinical Goal(s):  Marland Kitchen Over the next 90 days, patient with work with PharmD and primary care provider to address optimized medication management for diabetes  Interventions: . Comprehensive medication review performed . Discussed that hair loss is not a documented side effect of Jardiance, and that any hair loss reported with metformin is generally more related to low B12 levels, but her most recent check in 08/2018 was high normal. Discussed  that hair loss may be related to thyroid hormone levels, however, TSH levels were normal on last check in 08/2018. Will let Dr. Nicki Reaper know about patient's concerns.  . Discussed financial criteria for patient assistance with Jardiance through [employer]; she notes that she isn't sure of her household income, but she will discuss with her husband and let me know. If <300% FLP, will pursue Jardiance patient assistance  . Reviewed outside med fill histories; patient up to date on fills for metformin, Jardiance, losartan, and atorvastatin.  Patient Self Care Activities:  . Patient will check blood glucose BID, document, and provide at future appointments . Patient will take medications as prescribed . Patient will contact provider with any episodes of hypoglycemia . Patient will report any questions or concerns to provider   Initial goal documentation        Ms. Taliaferro was given information about Chronic Care Management services today including:  1. CCM service includes personalized support from designated clinical staff supervised by her physician, including individualized plan of care and coordination with other care providers 2. 24/7 contact phone numbers for assistance for urgent and routine care needs. 3. Service will only be billed when office clinical staff spend 20 minutes or more in a month to coordinate care. 4. Only one practitioner may furnish and bill the service in a calendar month. 5. The patient may stop CCM services at any time (effective at the end of the month) by phone call to the office staff. 6. The  patient will be responsible for cost sharing (co-pay) of up to 20% of the service fee (after annual deductible is met).  Patient agreed to services and verbal consent obtained.   The patient verbalized understanding of instructions provided today and declined a print copy of patient instruction materials.   Plan: - Will outreach patient in 4-6 weeks for continued  medication management support   Catie Darnelle Maffucci, PharmD, [place] Pharmacist [facility] [place] [phone]

## 2019-02-02 NOTE — Chronic Care Management (AMB) (Addendum)
Chronic Care Management   Note  02/02/2019 Name: Michelle Hawkins MRN: [id number] DOB: 1946/03/30   Subjective:  Michelle Hawkins is a 73 y.o. year old female who is a primary care patient of Einar Pheasant, MD. The CCM team was consulted for assistance with chronic disease management and care coordination needs.    Received referral for assistance in managing diabetes.   Michelle Hawkins was given information about Chronic Care Management services today including:  1. CCM service includes personalized support from designated clinical staff supervised by her physician, including individualized plan of care and coordination with other care providers 2. 24/7 contact phone numbers for assistance for urgent and routine care needs. 3. Service will only be billed when office clinical staff spend 20 minutes or more in a month to coordinate care. 4. Only one practitioner may furnish and bill the service in a calendar month. 5. The patient may stop CCM services at any time (effective at the end of the month) by phone call to the office staff. 6. The patient will be responsible for cost sharing (co-pay) of up to 20% of the service fee (after annual deductible is met).  Patient agreed to services and verbal consent obtained.   Review of patient status, including review of consultants reports, laboratory and other test data, was performed as part of comprehensive evaluation and provision of chronic care management services.   Objective:  Lab Results  Component Value Date   CREATININE 0.74 10/31/2018   CREATININE 0.73 07/23/2018   CREATININE 0.69 03/24/2018    Lab Results  Component Value Date   HGBA1C 7.3 (H) 10/31/2018       Component Value Date/Time   CHOL 149 10/31/2018 0902   TRIG 72.0 10/31/2018 0902   HDL 60.30 10/31/2018 0902   CHOLHDL 2 10/31/2018 0902   VLDL 14.4 10/31/2018 0902   LDLCALC 75 10/31/2018 0902    Clinical ASCVD: No  The 10-year ASCVD risk score Mikey Bussing DC Jr., et al., 2013)  is: 25.3%   Values used to calculate the score:     Age: 75 years     Sex: Female     Is Non-Hispanic African American: No     Diabetic: Yes     Tobacco smoker: No     Systolic Blood Pressure: 235 mmHg     Is BP treated: Yes     HDL Cholesterol: 60.3 mg/dL     Total Cholesterol: 149 mg/dL    BP Readings from Last 3 Encounters:  08/18/18 117/70  07/25/18 122/60  07/16/18 126/66    No Known Allergies  Medications Reviewed Today    Reviewed by De Hollingshead, [place] (Pharmacist) on 02/02/19 at 1012  Med List Status: <None>  Medication Order Taking? Sig Documenting Provider Last Dose Status Informant  ACCU-CHEK SOFTCLIX LANCETS lancets [id number]  Check blood sugar twice daily Dx 250.00 Einar Pheasant, MD  Active   aspirin 81 MG tablet [id number]  Take 81 mg by mouth 2 (two) times daily.  [provider]  Active            Med Note Mayo Ao Aug 18, 2018  9:48 AM) Takes 2 QPM  atorvastatin (LIPITOR) 20 MG tablet [id number]  TAKE ONE TABLET BY MOUTH EVERY DAY Einar Pheasant, MD  Active   Biotin (BIOTIN 5000) 5 MG CAPS [id number] Yes Take by mouth. [provider] Taking Active   Blood Glucose Monitoring Suppl (ONE TOUCH ULTRA SYSTEM KIT) w/Device KIT  [id number]  1 kit by Does not apply route once. [provider]  Active Self  calcium citrate (CALCITRATE - DOSED IN MG ELEMENTAL CALCIUM) 950 MG tablet [id number]  Take 200 mg of elemental calcium by mouth daily. [provider]  Active   cholecalciferol (VITAMIN D) 400 UNITS TABS [id number]  Take by mouth. [provider]  Active   escitalopram (LEXAPRO) 20 MG tablet [id number]  TAKE ONE TABLET EVERY DAY Einar Pheasant, MD  Active   fish oil-omega-3 fatty acids 1000 MG capsule [id number]  Take 2 g by mouth daily. [provider]  Active   glucose blood (ONE TOUCH ULTRA TEST) test strip [id number]  CHECK BLOOD SUGAR THREE TIMES DAILY Dx E11.9 Einar Pheasant, MD  Active    JARDIANCE 10 MG TABS tablet [id number] Yes TAKE ONE TABLET EVERY DAY Einar Pheasant, MD Taking Active   levothyroxine (SYNTHROID) 25 MCG tablet [id number] Yes TAKE 1 TABLET EVERY DAY ON EMPTY STOMACHWITH A GLASS OF WATER AT LEAST 30-60 MINBEFORE BREAKFAST Einar Pheasant, MD Taking Active   losartan (COZAAR) 100 MG tablet [id number]  TAKE 1 TABLET BY MOUTH DAILY Einar Pheasant, MD  Active   metFORMIN (GLUCOPHAGE XR) 500 MG 24 hr tablet [id number] Yes Take 2 tablets (1,000 mg total) by mouth 2 (two) times daily. Einar Pheasant, MD Taking Active   metoprolol succinate (TOPROL-XL) 50 MG 24 hr tablet [id number]  TAKE 1 TABLET BY MOUTH DAILY WITH OR FOLLOWING A MEAL Einar Pheasant, MD  Active   Multiple Vitamin (MULTIVITAMIN WITH MINERALS) TABS tablet [id number]  Take 1 tablet by mouth daily. [provider]  Active   omeprazole (PRILOSEC) 40 MG capsule [id number]  TAKE 1 CAPSULE EVERY DAY Einar Pheasant, MD  Active   Probiotic Product (PROBIOTIC DAILY PO) [id number]  Take 1 tablet by mouth. [provider]  Active Self  TURMERIC PO [id number]  Take by mouth daily.  [provider]  Active            Assessment:   Goals Addressed            This Visit's Progress     Patient Stated   . "I want to stay in good health" (pt-stated)       Current Barriers:  . Diabetes: uncontrolled though improved; most recent A1c 7.3%  . Current antihyperglycemic regimen: metformin XR 1000 mg BID, Jardiance 10 mg daily.  o Denies any s/sx genitourinary infections with Jardiance, though she does note hair loss somewhat coinciding with the start of Jardiance therapy. No listed incidence of this in Jardiance labeling. She also notes that her sisters have hair loss due to metformin. She notes much anxiety about changes in her physical appearance with age (wrinkles, etc). Does note that she hasn't had any hair loss in the past 3 days.  o Notes ~ 10 lbs weight loss; last office weight was 145,  but notes that her weight is ~133 lbs recently. Her personal goal weight is 135 lbs. o Also notes cost concerns with Jardiance, and is worried about going into the Medicare Coverage Gap  . Current blood glucose readings: fasting 100s-110s, bedtime readings 80-110s; notes that she takes 1 glucose tablets before bed if reading is in the 80s.  . Cardiovascular risk reduction: o Current hypertensive regimen: losartan 100 mg daily, metoprolol succinate 50 mg daily; BP well controlled <130/80 at most recent office visits o Current hyperlipidemia regimen: atorvastatin 20 mg daily, omega 3 fatty acids 2 g daily; LDL well  controlled at 75, TG well controlled at 72 on last check  Pharmacist Clinical Goal(s):  Marland Kitchen Over the next 90 days, patient with work with PharmD and primary care provider to address optimized medication management for diabetes  Interventions: . Comprehensive medication review performed . Discussed that hair loss is not a documented side effect of Jardiance, and that any hair loss reported with metformin is generally more related to low B12 levels, but her most recent check in 08/2018 was high normal. Discussed that hair loss may be related to thyroid hormone levels, however, TSH levels were normal on last check in 08/2018. Will let Dr. Nicki Reaper know about patient's concerns.  . Discussed financial criteria for patient assistance with Jardiance through [employer]; she notes that she isn't sure of her household income, but she will discuss with her husband and let me know. If <300% FLP, will pursue Jardiance patient assistance  . Reviewed outside med fill histories; patient up to date on fills for metformin, Jardiance, losartan, and atorvastatin.  Patient Self Care Activities:  . Patient will check blood glucose BID, document, and provide at future appointments . Patient will take medications as prescribed . Patient will contact provider with any episodes of hypoglycemia . Patient will  report any questions or concerns to provider   Initial goal documentation        Plan: - Will outreach patient in 4-6 weeks for continued medication management support   Catie Darnelle Maffucci, PharmD, [place] Pharmacist [place] [phone]   Reviewed above information.  Agree with assessment and plan.  Will d/w pt regarding hair loss at her next appt.  May need dermatology referral given labs ok.    Dr Nicki Reaper

## 2019-02-05 ENCOUNTER — Other Ambulatory Visit: Payer: Self-pay

## 2019-02-18 ENCOUNTER — Telehealth: Payer: Self-pay

## 2019-02-18 NOTE — Telephone Encounter (Signed)
Ok

## 2019-02-18 NOTE — Telephone Encounter (Signed)
Patient would like to move appt up with Catie if possible. She has stopped taking her Jardiance as of today. She would like to discuss another medication since she is losing her hair. No other acute issues. Advised I would send message to Catie to see if we can move appt up and to Dr Nicki Reaper as an Juluis Rainier

## 2019-02-18 NOTE — Telephone Encounter (Signed)
Called and spoke to patient.  Pt said that Joellen Jersey, PharmD put her on Jardiance. Pt said that her hair is falling out.   Pt said that she has spoken to [facility] about the side effects before.   Pt wants to make Dr. Nicki Reaper aware.

## 2019-02-18 NOTE — Telephone Encounter (Signed)
Copied from [place] [id number]. Topic: General - Call Back - No Documentation >> Feb 18, 2019 11:10 AM Erick Blinks wrote: Call back request from Nurse regarding Jardiance complications.  Best contact: [phone]

## 2019-02-26 ENCOUNTER — Other Ambulatory Visit: Payer: Self-pay | Admitting: Internal Medicine

## 2019-02-26 ENCOUNTER — Ambulatory Visit: Payer: PPO | Admitting: Pharmacist

## 2019-02-26 DIAGNOSIS — D75839 Thrombocytosis, unspecified: Secondary | ICD-10-CM

## 2019-02-26 DIAGNOSIS — E119 Type 2 diabetes mellitus without complications: Secondary | ICD-10-CM

## 2019-02-26 DIAGNOSIS — I10 Essential (primary) hypertension: Secondary | ICD-10-CM

## 2019-02-26 DIAGNOSIS — E7800 Pure hypercholesterolemia, unspecified: Secondary | ICD-10-CM

## 2019-02-26 DIAGNOSIS — D649 Anemia, unspecified: Secondary | ICD-10-CM

## 2019-02-26 DIAGNOSIS — D473 Essential (hemorrhagic) thrombocythemia: Secondary | ICD-10-CM

## 2019-02-26 MED ORDER — GLUCOSE BLOOD VI STRP: ORAL_STRIP | 3 refills | Status: DC

## 2019-02-26 NOTE — Patient Instructions (Signed)
Visit Information  Goals Addressed            This Visit's Progress     Patient Stated   . "I want to stay in good health" (pt-stated)       Current Barriers:  . Diabetes: uncontrolled though improved; most recent A1c 7.3%, DUE . Current antihyperglycemic regimen: metformin XR 1000 mg BID o Notes off Jardiance ~ 1.5 weeks; notes some improvement in hair loss  . Current blood glucose readings:  Date Fasting Bedtime   16-Aug 117 95  17-Jul 114 92  18-Jul 95 104  19-Aug 118 102  20-Aug 101   Average 109 98   . Meal Patterns: o Breakfast: bagels + honey; sugar free blue berry preserves; other times eats a fried egg; occasionally 2 pieces cinnamon toast o Lunch: cheese crackers + small V8 juice;  . Cardiovascular risk reduction: o Current hypertensive regimen: losartan 100 mg daily, metoprolol succinate 50 mg daily; BP well controlled <130/80 at most recent office visits o Current hyperlipidemia regimen: atorvastatin 20 mg daily, omega 3 fatty acids 2 g daily; LDL well controlled at 75, TG well controlled at 72 on last check  Pharmacist Clinical Goal(s):  Marland Kitchen Over the next 90 days, patient with work with PharmD and primary care provider to address optimized medication management for diabetes  Interventions: . Due to controlled sugar results and patient preference, will d/c Jardiance.  . Continue metformin 1000 mg BID . Extensive discussion regarding dietary choices. Appears patient's breakfast is highest carbohydrate meal; encouraged patient to check occasional post-breakfast sugars to ensure at goal, and modify breakfast choices if not . Sent updated prescription for test strips to allow for testing up to 3 times daily.  . Patient is nearly due for follow up and labwork with Dr. Nicki Reaper (last note mentions to schedule after 03/26/2019). Will collaborate with Dr. Nicki Reaper and clinic staff for necessary lab work and scheduling  Patient Self Care Activities:  . Patient will check blood  glucose TID, document, and provide at future appointments . Patient will take medications as prescribed . Patient will contact provider with any episodes of hypoglycemia . Patient will report any questions or concerns to provider   Please see past updates related to this goal by clicking on the "Past Updates" button in the selected goal         The patient verbalized understanding of instructions provided today and declined a print copy of patient instruction materials.     Plan:  - Will outreach patient in ~6-8 weeks, after next appointment with Dr. Rondall Allegra, PharmD, CPP Clinical Pharmacist [place] [phone]

## 2019-02-26 NOTE — Progress Notes (Signed)
Order placed for fasting labs.   

## 2019-02-26 NOTE — Chronic Care Management (AMB) (Signed)
Chronic Care Management   Follow Up Note   02/26/2019 Name: Michelle Hawkins MRN: [id number] DOB: 08-27-1945  Referred by: Einar Pheasant, MD Reason for referral : Chronic Care Management (Medication Management)   Michelle Hawkins is a 73 y.o. year old female who is a primary care patient of Einar Pheasant, MD. The CCM team was consulted for assistance with chronic disease management and care coordination needs.    Review of patient status, including review of consultants reports, relevant laboratory and other test results, and collaboration with appropriate care team members and the patient's provider was performed as part of comprehensive patient evaluation and provision of chronic care management services.    Outpatient Encounter Medications as of 02/26/2019  Medication Sig Note  . ACCU-CHEK SOFTCLIX LANCETS lancets Check blood sugar twice daily Dx 250.00   . aspirin 81 MG tablet Take 81 mg by mouth 2 (two) times daily.  08/18/2018: Takes 2 QPM  . atorvastatin (LIPITOR) 20 MG tablet TAKE ONE TABLET BY MOUTH EVERY DAY   . Biotin (BIOTIN 5000) 5 MG CAPS Take by mouth.   . Blood Glucose Monitoring Suppl (ONE TOUCH ULTRA SYSTEM KIT) w/Device KIT 1 kit by Does not apply route once.   . calcium citrate (CALCITRATE - DOSED IN MG ELEMENTAL CALCIUM) 950 MG tablet Take 200 mg of elemental calcium by mouth daily.   . cholecalciferol (VITAMIN D) 400 UNITS TABS Take by mouth.   . escitalopram (LEXAPRO) 20 MG tablet TAKE ONE TABLET EVERY DAY   . fish oil-omega-3 fatty acids 1000 MG capsule Take 2 g by mouth daily.   Marland Kitchen glucose blood (ONE TOUCH ULTRA TEST) test strip CHECK BLOOD SUGAR UP TO THREE TIMES DAILY Dx E11.9   . levothyroxine (SYNTHROID) 25 MCG tablet TAKE 1 TABLET EVERY DAY ON EMPTY STOMACHWITH A GLASS OF WATER AT LEAST 30-60 MINBEFORE BREAKFAST   . losartan (COZAAR) 100 MG tablet TAKE 1 TABLET BY MOUTH DAILY   . metFORMIN (GLUCOPHAGE XR) 500 MG 24 hr tablet Take 2 tablets (1,000 mg total) by mouth  2 (two) times daily.   . metoprolol succinate (TOPROL-XL) 50 MG 24 hr tablet TAKE 1 TABLET BY MOUTH DAILY WITH OR FOLLOWING A MEAL   . Multiple Vitamin (MULTIVITAMIN WITH MINERALS) TABS tablet Take 1 tablet by mouth daily.   Marland Kitchen omeprazole (PRILOSEC) 40 MG capsule TAKE 1 CAPSULE EVERY DAY   . Probiotic Product (PROBIOTIC DAILY PO) Take 1 tablet by mouth.   . TURMERIC PO Take by mouth daily.    . [DISCONTINUED] glucose blood (ONE TOUCH ULTRA TEST) test strip CHECK BLOOD SUGAR THREE TIMES DAILY Dx E11.9 (Patient taking differently: CHECK BLOOD SUGAR UP TO THREE TIMES DAILY Dx E11.9)   . [DISCONTINUED] JARDIANCE 10 MG TABS tablet TAKE ONE TABLET EVERY DAY    No facility-administered encounter medications on file as of 02/26/2019.      Goals Addressed            This Visit's Progress     Patient Stated   . "I want to stay in good health" (pt-stated)       Current Barriers:  . Diabetes: uncontrolled though improved; most recent A1c 7.3%, DUE . Current antihyperglycemic regimen: metformin XR 1000 mg BID o Notes off Jardiance ~ 1.5 weeks; notes some improvement in hair loss  . Current blood glucose readings:  Date Fasting Bedtime   16-Aug 117 95  17-Jul 114 92  18-Jul 95 104  19-Aug 118 102  20-Aug  101   Average 109 98   . Meal Patterns: o Breakfast: bagels + honey; sugar free blue berry preserves; other times eats a fried egg; occasionally 2 pieces cinnamon toast o Lunch: cheese crackers + small V8 juice;  . Cardiovascular risk reduction: o Current hypertensive regimen: losartan 100 mg daily, metoprolol succinate 50 mg daily; BP well controlled <130/80 at most recent office visits o Current hyperlipidemia regimen: atorvastatin 20 mg daily, omega 3 fatty acids 2 g daily; LDL well controlled at 75, TG well controlled at 72 on last check  Pharmacist Clinical Goal(s):  Marland Kitchen Over the next 90 days, patient with work with PharmD and primary care provider to address optimized medication  management for diabetes  Interventions: . Due to controlled sugar results and patient preference, will d/c Jardiance.  . Continue metformin 1000 mg BID . Extensive discussion regarding dietary choices. Appears patient's breakfast is highest carbohydrate meal; encouraged patient to check occasional post-breakfast sugars to ensure at goal, and modify breakfast choices if not . Sent updated prescription for test strips to allow for testing up to 3 times daily.  . Patient is nearly due for follow up and labwork with Dr. Nicki Reaper (last note mentions to schedule after 03/26/2019). Will collaborate with Dr. Nicki Reaper and clinic staff for necessary lab work and scheduling  Patient Self Care Activities:  . Patient will check blood glucose TID, document, and provide at future appointments . Patient will take medications as prescribed . Patient will contact provider with any episodes of hypoglycemia . Patient will report any questions or concerns to provider   Please see past updates related to this goal by clicking on the "Past Updates" button in the selected goal         Plan:  - Will outreach patient in ~6-8 weeks, after next appointment with Dr. Rondall Allegra, PharmD, CPP Clinical Pharmacist [place] [phone]

## 2019-02-26 NOTE — Progress Notes (Signed)
Patient ID: Michelle Hawkins, female   DOB: October 15, 1945, 72 y.o.   MRN: [id number] Reviewed above information.  Agree with assessment and plan.  Schedule physical after 03/26/19.  Schedule fasting labs.    Dr Nicki Reaper

## 2019-03-03 ENCOUNTER — Other Ambulatory Visit: Payer: PPO

## 2019-03-09 ENCOUNTER — Other Ambulatory Visit: Payer: Self-pay | Admitting: Internal Medicine

## 2019-03-09 ENCOUNTER — Telehealth: Payer: PPO

## 2019-03-26 ENCOUNTER — Telehealth: Payer: Self-pay | Admitting: Internal Medicine

## 2019-03-26 ENCOUNTER — Other Ambulatory Visit: Payer: Self-pay

## 2019-03-26 ENCOUNTER — Other Ambulatory Visit (INDEPENDENT_AMBULATORY_CARE_PROVIDER_SITE_OTHER): Payer: PPO

## 2019-03-26 DIAGNOSIS — D473 Essential (hemorrhagic) thrombocythemia: Secondary | ICD-10-CM

## 2019-03-26 DIAGNOSIS — E119 Type 2 diabetes mellitus without complications: Secondary | ICD-10-CM

## 2019-03-26 DIAGNOSIS — D649 Anemia, unspecified: Secondary | ICD-10-CM

## 2019-03-26 DIAGNOSIS — E7800 Pure hypercholesterolemia, unspecified: Secondary | ICD-10-CM

## 2019-03-26 DIAGNOSIS — I10 Essential (primary) hypertension: Secondary | ICD-10-CM

## 2019-03-26 DIAGNOSIS — D75839 Thrombocytosis, unspecified: Secondary | ICD-10-CM

## 2019-03-26 LAB — BASIC METABOLIC PANEL
BUN: 15 mg/dL (ref 6–23)
CO2: 30 mEq/L (ref 19–32)
Calcium: 9.6 mg/dL (ref 8.4–10.5)
Chloride: 97 mEq/L (ref 96–112)
Creatinine, Ser: 0.67 mg/dL (ref 0.40–1.20)
GFR: 86.04 mL/min (ref >60.00)
Glucose, Bld: 102 mg/dL — ABNORMAL HIGH (ref 70–99)
Potassium: 4.3 mEq/L (ref 3.5–5.1)
Sodium: 133 mEq/L — ABNORMAL LOW (ref 135–145)

## 2019-03-26 LAB — CBC WITH DIFFERENTIAL/PLATELET
Basophils Absolute: 0.1 10*3/uL (ref 0.0–0.1)
Basophils Relative: 1.3 % (ref 0.0–3.0)
Eosinophils Absolute: 0.5 10*3/uL (ref 0.0–0.7)
Eosinophils Relative: 5.1 % — ABNORMAL HIGH (ref 0.0–5.0)
HCT: 36.5 % (ref 36.0–46.0)
Hemoglobin: 11.8 g/dL — ABNORMAL LOW (ref 12.0–15.0)
Lymphocytes Relative: 27.4 % (ref 12.0–46.0)
Lymphs Abs: 2.7 10*3/uL (ref 0.7–4.0)
MCHC: 32.2 g/dL (ref 30.0–36.0)
MCV: 87.8 fl (ref 78.0–100.0)
Monocytes Absolute: 1.1 10*3/uL — ABNORMAL HIGH (ref 0.1–1.0)
Monocytes Relative: 11.5 % (ref 3.0–12.0)
Neutro Abs: 5.5 10*3/uL (ref 1.4–7.7)
Neutrophils Relative %: 54.7 % (ref 43.0–77.0)
Platelets: 333 10*3/uL (ref 150.0–400.0)
RBC: 4.16 Mil/uL (ref 3.87–5.11)
RDW: 15.1 % (ref 11.5–15.5)
WBC: 10 10*3/uL (ref 4.0–10.5)

## 2019-03-26 LAB — LIPID PANEL
Cholesterol: 149 mg/dL (ref 0–200)
HDL: 53.5 mg/dL (ref >39.00)
LDL Cholesterol: 79 mg/dL (ref 0–99)
NonHDL: 95.84
Total CHOL/HDL Ratio: 3
Triglycerides: 86 mg/dL (ref 0.0–149.0)
VLDL: 17.2 mg/dL (ref 0.0–40.0)

## 2019-03-26 LAB — FERRITIN: Ferritin: 12.4 ng/mL (ref 10.0–291.0)

## 2019-03-26 LAB — HEMOGLOBIN A1C: Hgb A1c MFr Bld: 7.2 % — ABNORMAL HIGH (ref 4.6–6.5)

## 2019-03-26 LAB — HEPATIC FUNCTION PANEL
ALT: 17 U/L (ref 0–35)
AST: 20 U/L (ref 0–37)
Albumin: 4.2 g/dL (ref 3.5–5.2)
Alkaline Phosphatase: 63 U/L (ref 39–117)
Bilirubin, Direct: 0.1 mg/dL (ref 0.0–0.3)
Total Bilirubin: 0.4 mg/dL (ref 0.2–1.2)
Total Protein: 6.8 g/dL (ref 6.0–8.3)

## 2019-03-26 NOTE — Telephone Encounter (Signed)
-----   Message from De Hollingshead, [facility] sent at 03/26/2019  4:00 PM EDT ----- Just saw the A1c.   If there isn't much room to move w/ dietary modifications, I think a DPP4 would be enough to get her to goal. I think I remember her being concerned about losing weight, so I would probably avoid a GLP1 if she doesn't want weight loss.   Catie

## 2019-03-30 ENCOUNTER — Encounter: Payer: Self-pay | Admitting: *Deleted

## 2019-04-06 ENCOUNTER — Ambulatory Visit: Payer: PPO | Admitting: Pharmacist

## 2019-04-06 ENCOUNTER — Telehealth: Payer: Self-pay | Admitting: Pharmacist

## 2019-04-06 DIAGNOSIS — E119 Type 2 diabetes mellitus without complications: Secondary | ICD-10-CM

## 2019-04-06 DIAGNOSIS — D509 Iron deficiency anemia, unspecified: Secondary | ICD-10-CM

## 2019-04-06 NOTE — Progress Notes (Signed)
Reviewed.  Agree with plan   Dr Terrance Usery 

## 2019-04-06 NOTE — Telephone Encounter (Signed)
Order placed for GI referral.. someone should be contacting her with appt date and time.

## 2019-04-06 NOTE — Chronic Care Management (AMB) (Signed)
Chronic Care Management   Follow Up Note   04/06/2019 Name: Michelle Hawkins MRN: [id number] DOB: 1946/03/27  Referred by: Einar Pheasant, MD Reason for referral : Chronic Care Management (Medication Management)   Michelle Hawkins is a 73 y.o. year old female who is a primary care patient of Einar Pheasant, MD. The CCM team was consulted for assistance with chronic disease management and care coordination needs.    Contacted patient for medication management review today.   Review of patient status, including review of consultants reports, relevant laboratory and other test results, and collaboration with appropriate care team members and the patient's provider was performed as part of comprehensive patient evaluation and provision of chronic care management services.    SDOH (Social Determinants of Health) screening performed today: Financial Strain  Stress. See Care Plan for related entries.   Advanced Directives Status: N See Care Plan and Vynca application for related entries.  Outpatient Encounter Medications as of 04/06/2019  Medication Sig Note  . metFORMIN (GLUCOPHAGE XR) 500 MG 24 hr tablet Take 2 tablets (1,000 mg total) by mouth 2 (two) times daily.   Marland Kitchen ACCU-CHEK SOFTCLIX LANCETS lancets Check blood sugar twice daily Dx 250.00   . aspirin 81 MG tablet Take 81 mg by mouth 2 (two) times daily.  08/18/2018: Takes 2 QPM  . atorvastatin (LIPITOR) 20 MG tablet TAKE ONE TABLET BY MOUTH EVERY DAY   . Biotin (BIOTIN 5000) 5 MG CAPS Take by mouth.   . Blood Glucose Monitoring Suppl (ONE TOUCH ULTRA SYSTEM KIT) w/Device KIT 1 kit by Does not apply route once.   . calcium citrate (CALCITRATE - DOSED IN MG ELEMENTAL CALCIUM) 950 MG tablet Take 200 mg of elemental calcium by mouth daily.   . cholecalciferol (VITAMIN D) 400 UNITS TABS Take by mouth.   . escitalopram (LEXAPRO) 20 MG tablet TAKE ONE TABLET EVERY DAY   . fish oil-omega-3 fatty acids 1000 MG capsule Take 2 g by mouth daily.   Marland Kitchen  glucose blood (ONE TOUCH ULTRA TEST) test strip CHECK BLOOD SUGAR UP TO THREE TIMES DAILY Dx E11.9   . levothyroxine (SYNTHROID) 25 MCG tablet TAKE 1 TABLET EVERY DAY ON EMPTY STOMACHWITH A GLASS OF WATER AT LEAST 30-60 MINBEFORE BREAKFAST   . losartan (COZAAR) 100 MG tablet TAKE 1 TABLET BY MOUTH DAILY   . metoprolol succinate (TOPROL-XL) 50 MG 24 hr tablet TAKE 1 TABLET BY MOUTH DAILY WITH OR FOLLOWING A MEAL   . Multiple Vitamin (MULTIVITAMIN WITH MINERALS) TABS tablet Take 1 tablet by mouth daily.   Marland Kitchen omeprazole (PRILOSEC) 40 MG capsule TAKE 1 CAPSULE EVERY DAY   . Probiotic Product (PROBIOTIC DAILY PO) Take 1 tablet by mouth.   . TURMERIC PO Take by mouth daily.     No facility-administered encounter medications on file as of 04/06/2019.      Goals Addressed            This Visit's Progress     Patient Stated   . "I want to stay in good health" (pt-stated)       Current Barriers:  . Diabetes: uncontrolled though improved; most recent A1c 7.2% . Current antihyperglycemic regimen: metformin XR 1000 mg BID o Did not tolerate Jardiance d/t reported hair falling out . Current blood glucose readings:  o Fasting: Hesitant to review today d/t feeling poorly about "multiple birthdays recently" resulting in lots of sweets . Meal Patterns: o Breakfast: Bagel (both halves), honey, sugar free preserves, sometimes  cream cheese; fried eggs; flour + water fried + sugar free syrup;  o Hesitant to review further meals . Cardiovascular risk reduction: o Current hypertensive regimen: losartan 100 mg daily, metoprolol succinate 50 mg daily; BP well controlled <130/80 at most recent office visits o Current hyperlipidemia regimen: atorvastatin 20 mg daily, omega 3 fatty acids 2 g daily; LDL well controlled at 75, TG well controlled at 72 on last check  Pharmacist Clinical Goal(s):  Marland Kitchen Over the next 90 days, patient with work with PharmD and primary care provider to address optimized medication  management for diabetes  Interventions: . Reviewed recent lab results. Patient hesitant about seeing GI doctor. Will collaborate with Dr. Nicki Reaper  . Reviewed A1c result. Patient somewhat disappointed in results. Reports feeling tired of having to focus on diabetes and tired of feeling stagnant. Notes that larger portion sizes and eating are her biggest problem.  . Originally planned to discuss DPP4 tx, but given patient's report of appetite contributing to biggest issues, discussed GLP1, particularly Trulicity. Patient does not want to lose any weight, so would choose Trulicity over other agents d/t least amount of weight loss. Patient hesitant of injections, but Trulicity has the easiest administration.  . Shared decision making with patient to focus on dietary/lifestyle modifications for the next 3 months, then re-evaluate with next A1c in December.   Patient Self Care Activities:  . Patient will check blood glucose TID, document, and provide at future appointments . Patient will take medications as prescribed . Patient will contact provider with any episodes of hypoglycemia . Patient will report any questions or concerns to provider   Please see past updates related to this goal by clicking on the "Past Updates" button in the selected goal         Plan:  - Will outreach patient in the next 4-5 weeks for continued medication management  Catie Darnelle Maffucci, PharmD, [place] Pharmacist [place] [phone]

## 2019-04-06 NOTE — Telephone Encounter (Signed)
Patient hesitant about seeing a GI physician, but amenable. She doesn't have any preferences of a particular physician

## 2019-04-06 NOTE — Patient Instructions (Signed)
Visit Information  Goals Addressed            This Visit's Progress     Patient Stated   . "I want to stay in good health" (pt-stated)       Current Barriers:  . Diabetes: uncontrolled though improved; most recent A1c 7.2% . Current antihyperglycemic regimen: metformin XR 1000 mg BID o Did not tolerate Jardiance d/t reported hair falling out . Current blood glucose readings:  o Fasting: Hesitant to review today d/t feeling poorly about "multiple birthdays recently" resulting in lots of sweets . Meal Patterns: o Breakfast: Bagel (both halves), honey, sugar free preserves, sometimes cream cheese; fried eggs; flour + water fried + sugar free syrup;  o Hesitant to review further meals . Cardiovascular risk reduction: o Current hypertensive regimen: losartan 100 mg daily, metoprolol succinate 50 mg daily; BP well controlled <130/80 at most recent office visits o Current hyperlipidemia regimen: atorvastatin 20 mg daily, omega 3 fatty acids 2 g daily; LDL well controlled at 75, TG well controlled at 72 on last check  Pharmacist Clinical Goal(s):  Marland Kitchen Over the next 90 days, patient with work with PharmD and primary care provider to address optimized medication management for diabetes  Interventions: . Reviewed recent lab results. Patient hesitant about seeing GI doctor. Will collaborate with Dr. Nicki Reaper  . Reviewed A1c result. Patient somewhat disappointed in results. Reports feeling tired of having to focus on diabetes and tired of feeling stagnant. Notes that larger portion sizes and eating are her biggest problem.  . Originally planned to discuss DPP4 tx, but given patient's report of appetite contributing to biggest issues, discussed GLP1, particularly Trulicity. Patient does not want to lose any weight, so would choose Trulicity over other agents d/t least amount of weight loss. Patient hesitant of injections, but Trulicity has the easiest administration.  . Shared decision making with  patient to focus on dietary/lifestyle modifications for the next 3 months, then re-evaluate with next A1c in December.   Patient Self Care Activities:  . Patient will check blood glucose TID, document, and provide at future appointments . Patient will take medications as prescribed . Patient will contact provider with any episodes of hypoglycemia . Patient will report any questions or concerns to provider   Please see past updates related to this goal by clicking on the "Past Updates" button in the selected goal         The patient verbalized understanding of instructions provided today and declined a print copy of patient instruction materials.   Plan:  - Will outreach patient in the next 4-5 weeks for continued medication management  Catie Darnelle Maffucci, PharmD, [place] Pharmacist [place] [phone]

## 2019-04-07 NOTE — Telephone Encounter (Signed)
Patient is aware. She would like an appointment with Dr Alice Reichert if possible.

## 2019-04-17 DIAGNOSIS — Z85828 Personal history of other malignant neoplasm of skin: Secondary | ICD-10-CM | POA: Diagnosis not present

## 2019-04-17 DIAGNOSIS — D485 Neoplasm of uncertain behavior of skin: Secondary | ICD-10-CM | POA: Diagnosis not present

## 2019-04-17 DIAGNOSIS — Z08 Encounter for follow-up examination after completed treatment for malignant neoplasm: Secondary | ICD-10-CM | POA: Diagnosis not present

## 2019-04-17 DIAGNOSIS — L309 Dermatitis, unspecified: Secondary | ICD-10-CM | POA: Diagnosis not present

## 2019-04-21 DIAGNOSIS — K5900 Constipation, unspecified: Secondary | ICD-10-CM | POA: Diagnosis not present

## 2019-04-21 DIAGNOSIS — K219 Gastro-esophageal reflux disease without esophagitis: Secondary | ICD-10-CM | POA: Diagnosis not present

## 2019-04-21 DIAGNOSIS — D509 Iron deficiency anemia, unspecified: Secondary | ICD-10-CM | POA: Diagnosis not present

## 2019-04-30 ENCOUNTER — Ambulatory Visit (INDEPENDENT_AMBULATORY_CARE_PROVIDER_SITE_OTHER): Payer: PPO | Admitting: Pharmacist

## 2019-04-30 DIAGNOSIS — E119 Type 2 diabetes mellitus without complications: Secondary | ICD-10-CM

## 2019-04-30 NOTE — Chronic Care Management (AMB) (Signed)
Chronic Care Management   Follow Up Note   04/30/2019 Name: Michelle Hawkins MRN: [id number] DOB: 1945-08-31  Referred by: Einar Pheasant, MD Reason for referral : Chronic Care Management (Medication Management)   Michelle Hawkins is a 73 y.o. year old female who is a primary care patient of Einar Pheasant, MD. The CCM team was consulted for assistance with chronic disease management and care coordination needs.    Contacted patient for medication management support.   Review of patient status, including review of consultants reports, relevant laboratory and other test results, and collaboration with appropriate care team members and the patient's provider was performed as part of comprehensive patient evaluation and provision of chronic care management services.    SDOH (Social Determinants of Health) screening performed today: Stress. See Care Plan for related entries.   Advanced Directives Status: N See Care Plan and Vynca application for related entries.  Outpatient Encounter Medications as of 04/30/2019  Medication Sig Note  . ACCU-CHEK SOFTCLIX LANCETS lancets Check blood sugar twice daily Dx 250.00   . aspirin 81 MG tablet Take 81 mg by mouth 2 (two) times daily.  08/18/2018: Takes 2 QPM  . atorvastatin (LIPITOR) 20 MG tablet TAKE ONE TABLET BY MOUTH EVERY DAY   . Biotin (BIOTIN 5000) 5 MG CAPS Take by mouth.   . Blood Glucose Monitoring Suppl (ONE TOUCH ULTRA SYSTEM KIT) w/Device KIT 1 kit by Does not apply route once.   . calcium citrate (CALCITRATE - DOSED IN MG ELEMENTAL CALCIUM) 950 MG tablet Take 200 mg of elemental calcium by mouth daily.   . cholecalciferol (VITAMIN D) 400 UNITS TABS Take by mouth.   . escitalopram (LEXAPRO) 20 MG tablet TAKE ONE TABLET EVERY DAY   . fish oil-omega-3 fatty acids 1000 MG capsule Take 2 g by mouth daily.   Marland Kitchen glucose blood (ONE TOUCH ULTRA TEST) test strip CHECK BLOOD SUGAR UP TO THREE TIMES DAILY Dx E11.9   . Iron-Vitamin C 65-125 MG TABS Take  1 tablet by mouth.   . levothyroxine (SYNTHROID) 25 MCG tablet TAKE 1 TABLET EVERY DAY ON EMPTY STOMACHWITH A GLASS OF WATER AT LEAST 30-60 MINBEFORE BREAKFAST   . losartan (COZAAR) 100 MG tablet TAKE 1 TABLET BY MOUTH DAILY   . metFORMIN (GLUCOPHAGE XR) 500 MG 24 hr tablet Take 2 tablets (1,000 mg total) by mouth 2 (two) times daily.   . metoprolol succinate (TOPROL-XL) 50 MG 24 hr tablet TAKE 1 TABLET BY MOUTH DAILY WITH OR FOLLOWING A MEAL   . Multiple Vitamin (MULTIVITAMIN WITH MINERALS) TABS tablet Take 1 tablet by mouth daily.   Marland Kitchen omeprazole (PRILOSEC) 40 MG capsule TAKE 1 CAPSULE EVERY DAY   . Probiotic Product (PROBIOTIC DAILY PO) Take 1 tablet by mouth.   . TURMERIC PO Take by mouth daily.     No facility-administered encounter medications on file as of 04/30/2019.      Goals Addressed            This Visit's Progress     Patient Stated   . "I want to stay in good health" (pt-stated)       Current Barriers:  . Diabetes: uncontrolled though improved; most recent A1c 7.2% o Notes that she saw [facility] about iron deficiency anemia. Patient elected oral iron replacement, was started on Vitron (iron + Vit C daily); Patient is to follow up with labwork in December prior to appointment with Dr. Nicki Reaper . Current antihyperglycemic regimen: metformin XR 1000  mg BID o Did not tolerate Jardiance d/t reported hair falling out . Reports 1-2 episodes of symptomatic hypoglycemia BG to 70; notes that she hadn't taken her medications that day; notes she felt very shaky, poor overall . Current blood glucose readings:  o Reports fastings remain well controlled, 100-120s . Cardiovascular risk reduction: o Current hypertensive regimen: losartan 100 mg daily, metoprolol succinate 50 mg daily; BP well controlled <130/80 at most recent office visits; has not been checking at home o Current hyperlipidemia regimen: atorvastatin 20 mg daily, omega 3 fatty acids 2 g daily; LDL well controlled at 75, TG well  controlled at 72 on last check  Pharmacist Clinical Goal(s):  Marland Kitchen Over the next 90 days, patient will work with PharmD and primary care provider to address optimized medication management for diabetes  Interventions: . Comprehensive medication review performed, medication list updated in electronic medical record . Praised patient for continued maintenance of A1c . Discussed Rule of 15 in treating hypoglycemic episodes. Discussed that a reading of 70 is not dangerously low, but that treating with some glucose tabs is appropriate to feel better. Discussed that metformin does not carry risk of causing hypoglycemia . D/t symptomatic hypoglycemia with BG in 70s, consider targeting a more relaxed A1c goal of <7.5% . Discussed GI appointment. Patient will continue to take Vitron supplement as prescribed until follow up  Patient Self Care Activities:  . Patient will check blood glucose TID, document, and provide at future appointments . Patient will take medications as prescribed . Patient will contact provider with any episodes of hypoglycemia . Patient will report any questions or concerns to provider   Please see past updates related to this goal by clicking on the "Past Updates" button in the selected goal          Plan:  - Scheduled outreach with patient in ~3 weeks for medication management support  Catie Darnelle Maffucci, PharmD, [place] Pharmacist [facility] [phone]

## 2019-04-30 NOTE — Patient Instructions (Signed)
Visit Information  Goals Addressed            This Visit's Progress     Patient Stated   . "I want to stay in good health" (pt-stated)       Current Barriers:  . Diabetes: uncontrolled though improved; most recent A1c 7.2% o Notes that she saw [facility] about iron deficiency anemia. Patient elected oral iron replacement, was started on Vitron (iron + Vit C daily); Patient is to follow up with labwork in December prior to appointment with Dr. Nicki Reaper . Current antihyperglycemic regimen: metformin XR 1000 mg BID o Did not tolerate Jardiance d/t reported hair falling out . Reports 1-2 episodes of symptomatic hypoglycemia BG to 70; notes that she hadn't taken her medications that day; notes she felt very shaky, poor overall . Current blood glucose readings:  o Reports fastings remain well controlled, 100-120s . Cardiovascular risk reduction: o Current hypertensive regimen: losartan 100 mg daily, metoprolol succinate 50 mg daily; BP well controlled <130/80 at most recent office visits; has not been checking at home o Current hyperlipidemia regimen: atorvastatin 20 mg daily, omega 3 fatty acids 2 g daily; LDL well controlled at 75, TG well controlled at 72 on last check  Pharmacist Clinical Goal(s):  Marland Kitchen Over the next 90 days, patient will work with PharmD and primary care provider to address optimized medication management for diabetes  Interventions: . Comprehensive medication review performed, medication list updated in electronic medical record . Praised patient for continued maintenance of A1c . Discussed Rule of 15 in treating hypoglycemic episodes. Discussed that a reading of 70 is not dangerously low, but that treating with some glucose tabs is appropriate to feel better. Discussed that metformin does not carry risk of causing hypoglycemia . D/t symptomatic hypoglycemia with BG in 70s, consider targeting a more relaxed A1c goal of <7.5% . Discussed GI appointment. Patient will continue to  take Vitron supplement as prescribed until follow up  Patient Self Care Activities:  . Patient will check blood glucose TID, document, and provide at future appointments . Patient will take medications as prescribed . Patient will contact provider with any episodes of hypoglycemia . Patient will report any questions or concerns to provider   Please see past updates related to this goal by clicking on the "Past Updates" button in the selected goal         The patient verbalized understanding of instructions provided today and declined a print copy of patient instruction materials.   Plan:  - Scheduled outreach with patient in ~3 weeks for medication management support  Catie Darnelle Maffucci, PharmD, [place] Pharmacist [facility] [place] [phone]

## 2019-05-01 NOTE — Progress Notes (Signed)
Reviewed information.  Agree with plan.  F/u regarding low sugars.    Dr Nicki Reaper

## 2019-05-06 DIAGNOSIS — E119 Type 2 diabetes mellitus without complications: Secondary | ICD-10-CM | POA: Diagnosis not present

## 2019-05-06 LAB — HM DIABETES EYE EXAM

## 2019-05-11 ENCOUNTER — Encounter: Payer: PPO | Admitting: Internal Medicine

## 2019-05-18 ENCOUNTER — Other Ambulatory Visit: Payer: Self-pay | Admitting: Internal Medicine

## 2019-05-26 DIAGNOSIS — K5909 Other constipation: Secondary | ICD-10-CM | POA: Diagnosis not present

## 2019-05-26 DIAGNOSIS — R194 Change in bowel habit: Secondary | ICD-10-CM | POA: Diagnosis not present

## 2019-05-27 ENCOUNTER — Other Ambulatory Visit: Payer: Self-pay | Admitting: General Surgery

## 2019-05-28 ENCOUNTER — Encounter: Payer: Self-pay | Admitting: Internal Medicine

## 2019-05-28 ENCOUNTER — Ambulatory Visit: Payer: Self-pay | Admitting: Pharmacist

## 2019-05-28 ENCOUNTER — Telehealth: Payer: PPO

## 2019-05-28 NOTE — Progress Notes (Signed)
Reviewed.  Agree with plan   Dr Amay Mijangos 

## 2019-05-28 NOTE — Chronic Care Management (AMB) (Signed)
  Chronic Care Management   Note  05/28/2019 Name: Michelle Hawkins MRN: [id number] DOB: [birth year]  Michelle Hawkins is a 73 y.o. year old female who is a primary care patient of Einar Pheasant, MD. The CCM team was consulted for assistance with chronic disease management and care coordination needs.    Attempted to contact patient for medication management review. Left HIPAA compliant message for patient to return my call at her convenience.   Follow up plan: - If I don't hear back, will outreach again in ~6-8 weeks, as patient as f/u with Dr. Nicki Reaper in ~4 weeks  Catie Darnelle Maffucci, PharmD, [place] Pharmacist [place] [phone]

## 2019-05-29 ENCOUNTER — Other Ambulatory Visit
Admission: RE | Admit: 2019-05-29 | Discharge: 2019-05-29 | Disposition: A | Discharge status: Home / Self Care | Payer: PPO | Source: Ambulatory Visit | Attending: General Surgery | Admitting: General Surgery

## 2019-05-29 ENCOUNTER — Other Ambulatory Visit: Payer: Self-pay

## 2019-05-29 DIAGNOSIS — Z20828 Contact with and (suspected) exposure to other viral communicable diseases: Secondary | ICD-10-CM | POA: Insufficient documentation

## 2019-05-29 DIAGNOSIS — Z01812 Encounter for preprocedural laboratory examination: Secondary | ICD-10-CM | POA: Insufficient documentation

## 2019-05-29 LAB — SARS CORONAVIRUS 2 (TAT 6-24 HRS): SARS Coronavirus 2: NEGATIVE

## 2019-06-01 ENCOUNTER — Other Ambulatory Visit: Payer: Self-pay | Admitting: Internal Medicine

## 2019-06-01 DIAGNOSIS — E119 Type 2 diabetes mellitus without complications: Secondary | ICD-10-CM

## 2019-06-03 ENCOUNTER — Ambulatory Visit: Payer: PPO | Admitting: Anesthesiology

## 2019-06-03 ENCOUNTER — Encounter
Admission: RE | Disposition: A | Discharge status: Home / Self Care | Payer: Self-pay | Source: Home / Self Care | Attending: General Surgery

## 2019-06-03 ENCOUNTER — Other Ambulatory Visit: Payer: Self-pay

## 2019-06-03 ENCOUNTER — Encounter: Payer: Self-pay | Admitting: Anesthesiology

## 2019-06-03 ENCOUNTER — Ambulatory Visit
Admission: RE | Admit: 2019-06-03 | Discharge: 2019-06-03 | Disposition: A | Discharge status: Home / Self Care | Payer: PPO | Attending: General Surgery | Admitting: General Surgery

## 2019-06-03 DIAGNOSIS — F329 Major depressive disorder, single episode, unspecified: Secondary | ICD-10-CM | POA: Diagnosis not present

## 2019-06-03 DIAGNOSIS — F419 Anxiety disorder, unspecified: Secondary | ICD-10-CM | POA: Insufficient documentation

## 2019-06-03 DIAGNOSIS — E119 Type 2 diabetes mellitus without complications: Secondary | ICD-10-CM | POA: Insufficient documentation

## 2019-06-03 DIAGNOSIS — K2960 Other gastritis without bleeding: Secondary | ICD-10-CM | POA: Diagnosis not present

## 2019-06-03 DIAGNOSIS — Z79890 Hormone replacement therapy (postmenopausal): Secondary | ICD-10-CM | POA: Diagnosis not present

## 2019-06-03 DIAGNOSIS — I10 Essential (primary) hypertension: Secondary | ICD-10-CM | POA: Insufficient documentation

## 2019-06-03 DIAGNOSIS — Z7984 Long term (current) use of oral hypoglycemic drugs: Secondary | ICD-10-CM | POA: Insufficient documentation

## 2019-06-03 DIAGNOSIS — Z7982 Long term (current) use of aspirin: Secondary | ICD-10-CM | POA: Diagnosis not present

## 2019-06-03 DIAGNOSIS — K2950 Unspecified chronic gastritis without bleeding: Secondary | ICD-10-CM | POA: Insufficient documentation

## 2019-06-03 DIAGNOSIS — Z87891 Personal history of nicotine dependence: Secondary | ICD-10-CM | POA: Insufficient documentation

## 2019-06-03 DIAGNOSIS — D509 Iron deficiency anemia, unspecified: Secondary | ICD-10-CM | POA: Diagnosis not present

## 2019-06-03 DIAGNOSIS — E039 Hypothyroidism, unspecified: Secondary | ICD-10-CM | POA: Insufficient documentation

## 2019-06-03 DIAGNOSIS — E7800 Pure hypercholesterolemia, unspecified: Secondary | ICD-10-CM | POA: Diagnosis not present

## 2019-06-03 DIAGNOSIS — Z853 Personal history of malignant neoplasm of breast: Secondary | ICD-10-CM | POA: Insufficient documentation

## 2019-06-03 DIAGNOSIS — D649 Anemia, unspecified: Secondary | ICD-10-CM | POA: Diagnosis not present

## 2019-06-03 DIAGNOSIS — Z79899 Other long term (current) drug therapy: Secondary | ICD-10-CM | POA: Diagnosis not present

## 2019-06-03 DIAGNOSIS — R194 Change in bowel habit: Secondary | ICD-10-CM | POA: Insufficient documentation

## 2019-06-03 DIAGNOSIS — K2970 Gastritis, unspecified, without bleeding: Secondary | ICD-10-CM | POA: Diagnosis not present

## 2019-06-03 DIAGNOSIS — K219 Gastro-esophageal reflux disease without esophagitis: Secondary | ICD-10-CM | POA: Diagnosis not present

## 2019-06-03 HISTORY — DX: Depression, unspecified: F32.A

## 2019-06-03 HISTORY — PX: ESOPHAGOGASTRODUODENOSCOPY: SHX5428

## 2019-06-03 HISTORY — DX: Anxiety disorder, unspecified: F41.9

## 2019-06-03 HISTORY — DX: Essential (primary) hypertension: I10

## 2019-06-03 HISTORY — PX: COLONOSCOPY WITH PROPOFOL: SHX5780

## 2019-06-03 LAB — GLUCOSE, CAPILLARY: Glucose-Capillary: 122 mg/dL — ABNORMAL HIGH (ref 70–99)

## 2019-06-03 SURGERY — COLONOSCOPY WITH PROPOFOL
Anesthesia: General

## 2019-06-03 MED ORDER — GLYCOPYRROLATE 0.2 MG/ML IJ SOLN
INTRAMUSCULAR | Status: DC | PRN
  Administered 2019-06-03: 0.2 mg via INTRAVENOUS

## 2019-06-03 MED ORDER — EPHEDRINE SULFATE 50 MG/ML IJ SOLN
INTRAMUSCULAR | Status: DC | PRN
  Administered 2019-06-03: 10 mg via INTRAVENOUS

## 2019-06-03 MED ORDER — PROPOFOL 500 MG/50ML IV EMUL
INTRAVENOUS | Status: AC
  Filled 2019-06-03: qty 50

## 2019-06-03 MED ORDER — PROPOFOL 500 MG/50ML IV EMUL
INTRAVENOUS | Status: DC | PRN
  Administered 2019-06-03: 120 ug/kg/min via INTRAVENOUS

## 2019-06-03 MED ORDER — MIDAZOLAM HCL 2 MG/2ML IJ SOLN
INTRAMUSCULAR | Status: AC
  Filled 2019-06-03: qty 2

## 2019-06-03 MED ORDER — SODIUM CHLORIDE 0.9 % IV SOLN
INTRAVENOUS | Status: DC
  Administered 2019-06-03: 08:00:00 via INTRAVENOUS

## 2019-06-03 MED ORDER — PHENYLEPHRINE HCL (PRESSORS) 10 MG/ML IV SOLN
INTRAVENOUS | Status: DC | PRN
  Administered 2019-06-03: 100 ug via INTRAVENOUS

## 2019-06-03 MED ORDER — LIDOCAINE 2% (20 MG/ML) 5 ML SYRINGE
INTRAMUSCULAR | Status: DC | PRN
  Administered 2019-06-03: 50 mg via INTRAVENOUS

## 2019-06-03 MED ORDER — LIDOCAINE HCL (PF) 2 % IJ SOLN
INTRAMUSCULAR | Status: AC
  Filled 2019-06-03: qty 10

## 2019-06-03 MED ORDER — GLYCOPYRROLATE 0.2 MG/ML IJ SOLN
INTRAMUSCULAR | Status: AC
  Filled 2019-06-03: qty 1

## 2019-06-03 MED ORDER — PROPOFOL 10 MG/ML IV BOLUS
INTRAVENOUS | Status: DC | PRN
  Administered 2019-06-03 (×2): 50 mg via INTRAVENOUS

## 2019-06-03 MED ORDER — MIDAZOLAM HCL 5 MG/5ML IJ SOLN
INTRAMUSCULAR | Status: DC | PRN
  Administered 2019-06-03: 2 mg via INTRAVENOUS

## 2019-06-03 NOTE — Op Note (Signed)
[facility] Patient Name: Michelle Hawkins Procedure Date: 06/03/2019 7:34 AM MRN: [id number] Account #: [id number] Date of Birth: 1945/11/12 Admit Type: Outpatient Age: 73 Room: [facility] ENDO ROOM 1 Gender: Female Note Status: Finalized Procedure:             Colonoscopy Indications:           Iron deficiency anemia Providers:             Robert Bellow, MD Medicines:             Monitored Anesthesia Care Complications:         No immediate complications. Procedure:             Pre-Anesthesia Assessment:                        - Prior to the procedure, a History and Physical was                         performed, and patient medications, allergies and                         sensitivities were reviewed. The patient's tolerance                         of previous anesthesia was reviewed.                        - The risks and benefits of the procedure and the                         sedation options and risks were discussed with the                         patient. All questions were answered and informed                         consent was obtained.                        After obtaining informed consent, the colonoscope was                         passed under direct vision. Throughout the procedure,                         the patient's blood pressure, pulse, and oxygen                         saturations were monitored continuously. The                         Colonoscope was introduced through the anus and                         advanced to the the cecum, identified by appendiceal                         orifice and ileocecal valve. The Colonoscope was  introduced through the and advanced to the. The                         colonoscopy was performed without difficulty. The                         patient tolerated the procedure well. The quality of                         the bowel preparation was excellent. Findings:   The entire examined colon appeared normal on direct and retroflexion       views. Impression:            - The entire examined colon is normal on direct and                         retroflexion views.                        - No specimens collected. Recommendation:        - Discharge patient to home (via wheelchair).                        - Return to endoscopist in 2 weeks. Procedure Code(s):     --- Professional ---                        [phone], Colonoscopy, flexible; diagnostic, including                         collection of specimen(s) by brushing or washing, when                         performed (separate procedure) Diagnosis Code(s):     --- Professional ---                        D50.9, Iron deficiency anemia, unspecified CPT copyright 2019 American Medical Association. All rights reserved. The codes documented in this report are preliminary and upon coder review may  be revised to meet current compliance requirements. Robert Bellow, MD 06/03/2019 8:40:31 AM This report has been signed electronically. Number of Addenda: 0 Note Initiated On: 06/03/2019 7:34 AM Scope Withdrawal Time: 0 hours 9 minutes 10 seconds  Total Procedure Duration: 0 hours 17 minutes 57 seconds       [facility]

## 2019-06-03 NOTE — Transfer of Care (Signed)
Immediate Anesthesia Transfer of Care Note  Patient: Michelle Hawkins  Procedure(s) Performed: COLONOSCOPY WITH PROPOFOL (N/A ) ESOPHAGOGASTRODUODENOSCOPY (EGD) (N/A )  Patient Location: Endoscopy Unit  Anesthesia Type:General  Level of Consciousness: awake  Airway & Oxygen Therapy: Patient Spontanous Breathing  Post-op Assessment: Report given to RN and Post -op Vital signs reviewed and stable  Post vital signs: Reviewed  Last Vitals:  Vitals Value Taken Time  BP 124/63 06/03/19 0844  Temp 36.6 C 06/03/19 0841  Pulse 88 06/03/19 0845  Resp 13 06/03/19 0845  SpO2 99 % 06/03/19 0845  Vitals shown include unvalidated device data.  Last Pain:  Vitals:   06/03/19 0841  TempSrc: Temporal  PainSc: 0-No pain         Complications: No apparent anesthesia complications

## 2019-06-03 NOTE — Anesthesia Preprocedure Evaluation (Signed)
Anesthesia Evaluation  Patient identified by MRN, date of birth, ID band Patient awake    Reviewed: Allergy & Precautions, NPO status , Patient's Chart, lab work & pertinent test results, reviewed documented beta blocker date and time   Airway Mallampati: III       Dental   Pulmonary former smoker,    Pulmonary exam normal        Cardiovascular hypertension, Pt. on home beta blockers and Pt. on medications Normal cardiovascular exam     Neuro/Psych negative neurological ROS  negative psych ROS   GI/Hepatic Neg liver ROS, GERD  ,  Endo/Other  diabetes, Well Controlled, Type 2Hypothyroidism   Renal/GU   negative genitourinary   Musculoskeletal   Abdominal Normal abdominal exam  (+)   Peds negative pediatric ROS (+)  Hematology  (+) anemia ,   Anesthesia Other Findings Past Medical History: No date: Allergic rhinitis 1996: Breast cancer ([place])     Comment:  s/p left breast lumpectomy (lymph node dissection - 2/11              positive), chemotherapy 1995: Breast cancer ([place])     Comment:  right breast lumpectomy No date: Diabetes mellitus No date: Dysphagia No date: GERD (gastroesophageal reflux disease) No date: H/O ulcer disease     Comment:  PUD No date: Hypercholesterolemia No date: Hypothyroidism     Comment:  multinodular goiter No date: Nephrolithiasis No date: Scoliosis No date: Skin cancer No date: SVT (supraventricular tachycardia) (HCC)  Reproductive/Obstetrics                             Anesthesia Physical Anesthesia Plan  ASA: II  Anesthesia Plan: General   Post-op Pain Management:    Induction: Intravenous  PONV Risk Score and Plan:   Airway Management Planned: Nasal Cannula  Additional Equipment:   Intra-op Plan:   Post-operative Plan:   Informed Consent: I have reviewed the patients History and Physical, chart, labs and discussed the procedure  including the risks, benefits and alternatives for the proposed anesthesia with the patient or authorized representative who has indicated his/her understanding and acceptance.     Dental advisory given  Plan Discussed with: CRNA and Surgeon  Anesthesia Plan Comments:         Anesthesia Quick Evaluation

## 2019-06-03 NOTE — Anesthesia Post-op Follow-up Note (Signed)
Anesthesia QCDR form completed.        

## 2019-06-03 NOTE — H&P (Signed)
Michelle Hawkins [id number] December 21, 1945     HPI:  73 y/o woman with a one year history of change in bowel habits with a six month history of increaseing difficulty with defecation. Recently noted anemia. For EGD/ colonoscopy.  Last study in 2014 was negative.   Medications Prior to Admission  Medication Sig Dispense Refill Last Dose  . Biotin (BIOTIN 5000) 5 MG CAPS Take by mouth.   Past Week at Unknown time  . Blood Glucose Monitoring Suppl (ONE TOUCH ULTRA SYSTEM KIT) w/Device KIT 1 kit by Does not apply route once.   06/02/2019 at Unknown time  . calcium citrate (CALCITRATE - DOSED IN MG ELEMENTAL CALCIUM) 950 MG tablet Take 200 mg of elemental calcium by mouth daily.   Past Week at Unknown time  . cholecalciferol (VITAMIN D) 400 UNITS TABS Take by mouth.   Past Week at Unknown time  . escitalopram (LEXAPRO) 20 MG tablet TAKE ONE TABLET EVERY DAY 90 tablet 1 Past Week at Unknown time  . fish oil-omega-3 fatty acids 1000 MG capsule Take 2 g by mouth daily.   Past Week at Unknown time  . glucose blood (ONE TOUCH ULTRA TEST) test strip CHECK BLOOD SUGAR UP TO THREE TIMES DAILY Dx E11.9 300 each 3 06/02/2019 at Unknown time  . Iron-Vitamin C 65-125 MG TABS Take 1 tablet by mouth.   Past Week at Unknown time  . levothyroxine (SYNTHROID) 25 MCG tablet TAKE 1 TABLET EVERY DAY ON EMPTY STOMACHWITH A GLASS OF WATER AT LEAST 30-60 MINBEFORE BREAKFAST 90 tablet 1 06/02/2019 at Unknown time  . losartan (COZAAR) 100 MG tablet TAKE 1 TABLET BY MOUTH DAILY 90 tablet 1 06/02/2019 at Unknown time  . metFORMIN (GLUCOPHAGE-XR) 500 MG 24 hr tablet TAKE TWO TABLETS TWICE DAILY 360 tablet 3 06/02/2019 at Unknown time  . metoprolol succinate (TOPROL-XL) 50 MG 24 hr tablet TAKE 1 TABLET BY MOUTH DAILY WITH OR FOLLOWING A MEAL 90 tablet 1 06/02/2019 at Unknown time  . Multiple Vitamin (MULTIVITAMIN WITH MINERALS) TABS tablet Take 1 tablet by mouth daily.   Past Week at Unknown time  . omeprazole (PRILOSEC) 40 MG capsule  TAKE 1 CAPSULE EVERY DAY 90 capsule 1 Past Week at Unknown time  . Probiotic Product (PROBIOTIC DAILY PO) Take 1 tablet by mouth.   Past Week at Unknown time  . TURMERIC PO Take by mouth daily.    Past Week at Unknown time  . ACCU-CHEK SOFTCLIX LANCETS lancets Check blood sugar twice daily Dx 250.00 200 each 1   . aspirin 81 MG tablet Take 81 mg by mouth 2 (two) times daily.    06/01/2019  . atorvastatin (LIPITOR) 20 MG tablet TAKE ONE TABLET EVERY DAY 90 tablet 2 06/02/2019   No Known Allergies Past Medical History:  Diagnosis Date  . Allergic rhinitis   . Anxiety   . Breast cancer ([place]) 1996   s/p left breast lumpectomy (lymph node dissection - 2/11 positive), chemotherapy  . Breast cancer ([place]) 1995   right breast lumpectomy  . Depression   . Diabetes mellitus   . Dysphagia   . GERD (gastroesophageal reflux disease)   . H/O ulcer disease    PUD  . Hypercholesterolemia   . Hypertension   . Hypothyroidism    multinodular goiter  . Nephrolithiasis   . Scoliosis   . Skin cancer   . SVT (supraventricular tachycardia) (HCC)    Past Surgical History:  Procedure Laterality Date  . ABDOMINAL HYSTERECTOMY  partial  . BREAST BIOPSY Left 09/2012   benign  . BREAST LUMPECTOMY Left 1996   lumpectomy with chemo and rad tx for breast ca (lymph node dissection - 2/11 positive  . BREAST LUMPECTOMY Right 1995   lumpectomy only  . BREAST LUMPECTOMY WITH AXILLARY LYMPH NODE DISSECTION  1996   left  . LUMBAR LAMINECTOMY  3/07   L5-S1   Social History   Socioeconomic History  . Marital status: Married    Spouse name: Not on file  . Number of children: Not on file  . Years of education: Not on file  . Highest education level: Not on file  Occupational History  . Not on file  Social Needs  . Financial resource strain: Not on file  . Food insecurity    Worry: Not on file    Inability: Not on file  . Transportation needs    Medical: Not on file    Non-medical: Not on file   Tobacco Use  . Smoking status: Former Smoker    Packs/day: 0.50    Years: 40.00    Pack years: 20.00    Types: Cigarettes    Quit date: 04/14/2004    Years since quitting: 15.1  . Smokeless tobacco: Never Used  Substance and Sexual Activity  . Alcohol use: No    Alcohol/week: 0.0 standard drinks  . Drug use: No  . Sexual activity: Never  Lifestyle  . Physical activity    Days per week: Not on file    Minutes per session: Not on file  . Stress: Not on file  Relationships  . Social Herbalist on phone: Not on file    Gets together: Not on file    Attends religious service: Not on file    Active member of club or organization: Not on file    Attends meetings of clubs or organizations: Not on file    Relationship status: Not on file  . Intimate partner violence    Fear of current or ex partner: Not on file    Emotionally abused: Not on file    Physically abused: Not on file    Forced sexual activity: Not on file  Other Topics Concern  . Not on file  Social History Narrative  . Not on file   Social History   Social History Narrative  . Not on file     ROS: Negative.     PE: HEENT: Negative. Lungs: Clear. Cardio: RR.   Assessment/Plan:  Proceed with planned endoscopy. Forest Gleason [facility] 06/03/2019

## 2019-06-03 NOTE — Op Note (Signed)
[facility] Gastroenterology Patient Name: Michelle Hawkins Procedure Date: 06/03/2019 7:35 AM MRN: [id number] Account #: [id number] Date of Birth: April 19, 1946 Admit Type: Outpatient Age: 73 Room: [facility] ENDO ROOM 1 Gender: Female Note Status: Finalized Procedure:             Upper GI endoscopy Indications:           Iron deficiency anemia Providers:             Robert Bellow, MD Medicines:             Monitored Anesthesia Care Complications:         No immediate complications. Procedure:             Pre-Anesthesia Assessment:                        - Prior to the procedure, a History and Physical was                         performed, and patient medications, allergies and                         sensitivities were reviewed. The patient's tolerance                         of previous anesthesia was reviewed.                        - The risks and benefits of the procedure and the                         sedation options and risks were discussed with the                         patient. All questions were answered and informed                         consent was obtained.                        After obtaining informed consent, the endoscope was                         passed under direct vision. Throughout the procedure,                         the patient's blood pressure, pulse, and oxygen                         saturations were monitored continuously. The Endoscope                         was introduced through the mouth, and advanced to the                         third part of duodenum. The upper GI endoscopy was                         accomplished without difficulty. The patient tolerated  the procedure well. Findings:      The esophagus was normal.      The examined duodenum was normal.      Diffuse mild inflammation characterized by erythema was found in the       gastric antrum. Biopsies were taken with a cold forceps for  histology. Impression:            - Normal esophagus.                        - Normal examined duodenum.                        - Chronic gastritis. Biopsied. Recommendation:        - Perform a colonoscopy today. Procedure Code(s):     --- Professional ---                        [phone], Esophagogastroduodenoscopy, flexible,                         transoral; with biopsy, single or multiple Diagnosis Code(s):     --- Professional ---                        K29.50, Unspecified chronic gastritis without bleeding                        D50.9, Iron deficiency anemia, unspecified CPT copyright 2019 American Medical Association. All rights reserved. The codes documented in this report are preliminary and upon coder review may  be revised to meet current compliance requirements. Robert Bellow, MD 06/03/2019 8:14:58 AM This report has been signed electronically. Number of Addenda: 0 Note Initiated On: 06/03/2019 7:35 AM      [facility]

## 2019-06-05 LAB — SURGICAL PATHOLOGY

## 2019-06-08 ENCOUNTER — Encounter: Payer: Self-pay | Admitting: General Surgery

## 2019-06-08 NOTE — Anesthesia Postprocedure Evaluation (Signed)
Anesthesia Post Note  Patient: Michelle Hawkins  Procedure(s) Performed: COLONOSCOPY WITH PROPOFOL (N/A ) ESOPHAGOGASTRODUODENOSCOPY (EGD) (N/A )  Patient location during evaluation: Endoscopy Anesthesia Type: General Level of consciousness: awake and alert and oriented Pain management: pain level controlled Vital Signs Assessment: post-procedure vital signs reviewed and stable Respiratory status: spontaneous breathing Cardiovascular status: blood pressure returned to baseline Anesthetic complications: no     Last Vitals:  Vitals:   06/03/19 0844 06/03/19 0911  BP: 124/63 (!) 152/63  Pulse: 93   Resp: 15   Temp:    SpO2: 100%     Last Pain:  Vitals:   06/05/19 0938  TempSrc:   PainSc: 0-No pain                 [name]

## 2019-06-10 ENCOUNTER — Telehealth: Payer: Self-pay | Admitting: *Deleted

## 2019-06-10 NOTE — Telephone Encounter (Signed)
Copied from [place] [id number]. Topic: Appointment Scheduling - Scheduling Inquiry for Clinic >> Jun 10, 2019 12:10 PM Reyne Dumas L wrote: Reason for CRM:   Pt calling to get labs scheduled prior to her CPE.  No active orders in.

## 2019-06-10 NOTE — Telephone Encounter (Signed)
She just had fasting labs 03/26/19.  If we do them prior to her 06/23/19 appt, this will be before 3 months.  I am not sure that insurance will pay if less than 3 months.  We can place order for future labs at her appt.

## 2019-06-10 NOTE — Telephone Encounter (Signed)
Does pt need to have labs done prior to her physical with you on 06/23/2019?

## 2019-06-12 NOTE — Telephone Encounter (Signed)
Patient called back. She is fine waiting until her appt on 06/23/19 to have her labs drawn.

## 2019-06-19 ENCOUNTER — Telehealth: Payer: Self-pay | Admitting: Internal Medicine

## 2019-06-19 DIAGNOSIS — D649 Anemia, unspecified: Secondary | ICD-10-CM

## 2019-06-19 DIAGNOSIS — I10 Essential (primary) hypertension: Secondary | ICD-10-CM

## 2019-06-19 DIAGNOSIS — E119 Type 2 diabetes mellitus without complications: Secondary | ICD-10-CM

## 2019-06-19 DIAGNOSIS — E7800 Pure hypercholesterolemia, unspecified: Secondary | ICD-10-CM

## 2019-06-19 NOTE — Telephone Encounter (Signed)
I have scheduled her for labs and ordered routine fasting labs. Just wanted you to double check and make sure you didn't need anything else ordered.

## 2019-06-19 NOTE — Telephone Encounter (Signed)
Pt is wanting to have labs done before her appt.. there are no lab orders in

## 2019-06-20 ENCOUNTER — Other Ambulatory Visit: Payer: Self-pay | Admitting: Internal Medicine

## 2019-06-20 DIAGNOSIS — D649 Anemia, unspecified: Secondary | ICD-10-CM

## 2019-06-20 NOTE — Telephone Encounter (Signed)
Reviewed.  Ferritin added.

## 2019-06-20 NOTE — Progress Notes (Signed)
Order placed for ferritin

## 2019-06-23 ENCOUNTER — Other Ambulatory Visit: Payer: Self-pay | Admitting: Internal Medicine

## 2019-06-23 ENCOUNTER — Ambulatory Visit: Payer: PPO | Admitting: Internal Medicine

## 2019-07-08 ENCOUNTER — Other Ambulatory Visit: Payer: Self-pay | Admitting: Internal Medicine

## 2019-07-16 ENCOUNTER — Other Ambulatory Visit: Payer: Self-pay

## 2019-07-16 ENCOUNTER — Other Ambulatory Visit (INDEPENDENT_AMBULATORY_CARE_PROVIDER_SITE_OTHER): Payer: PPO

## 2019-07-16 DIAGNOSIS — E119 Type 2 diabetes mellitus without complications: Secondary | ICD-10-CM | POA: Diagnosis not present

## 2019-07-16 DIAGNOSIS — E7800 Pure hypercholesterolemia, unspecified: Secondary | ICD-10-CM | POA: Diagnosis not present

## 2019-07-16 DIAGNOSIS — D649 Anemia, unspecified: Secondary | ICD-10-CM | POA: Diagnosis not present

## 2019-07-16 DIAGNOSIS — I10 Essential (primary) hypertension: Secondary | ICD-10-CM

## 2019-07-16 LAB — CBC WITH DIFFERENTIAL/PLATELET
Basophils Absolute: 0.1 10*3/uL (ref 0.0–0.1)
Basophils Relative: 1 % (ref 0.0–3.0)
Eosinophils Absolute: 0.5 10*3/uL (ref 0.0–0.7)
Eosinophils Relative: 5 % (ref 0.0–5.0)
HCT: 36.7 % (ref 36.0–46.0)
Hemoglobin: 12.1 g/dL (ref 12.0–15.0)
Lymphocytes Relative: 27.9 % (ref 12.0–46.0)
Lymphs Abs: 2.6 10*3/uL (ref 0.7–4.0)
MCHC: 33 g/dL (ref 30.0–36.0)
MCV: 89.9 fl (ref 78.0–100.0)
Monocytes Absolute: 0.9 10*3/uL (ref 0.1–1.0)
Monocytes Relative: 10.2 % (ref 3.0–12.0)
Neutro Abs: 5.2 10*3/uL (ref 1.4–7.7)
Neutrophils Relative %: 55.9 % (ref 43.0–77.0)
Platelets: 349 10*3/uL (ref 150.0–400.0)
RBC: 4.08 Mil/uL (ref 3.87–5.11)
RDW: 14.1 % (ref 11.5–15.5)
WBC: 9.3 10*3/uL (ref 4.0–10.5)

## 2019-07-16 LAB — BASIC METABOLIC PANEL
BUN: 17 mg/dL (ref 6–23)
CO2: 28 mEq/L (ref 19–32)
Calcium: 9.8 mg/dL (ref 8.4–10.5)
Chloride: 94 mEq/L — ABNORMAL LOW (ref 96–112)
Creatinine, Ser: 0.73 mg/dL (ref 0.40–1.20)
GFR: 77.87 mL/min (ref >60.00)
Glucose, Bld: 120 mg/dL — ABNORMAL HIGH (ref 70–99)
Potassium: 4.2 mEq/L (ref 3.5–5.1)
Sodium: 131 mEq/L — ABNORMAL LOW (ref 135–145)

## 2019-07-16 LAB — LIPID PANEL
Cholesterol: 155 mg/dL (ref 0–200)
HDL: 53.8 mg/dL (ref >39.00)
LDL Cholesterol: 81 mg/dL (ref 0–99)
NonHDL: 101.36
Total CHOL/HDL Ratio: 3
Triglycerides: 101 mg/dL (ref 0.0–149.0)
VLDL: 20.2 mg/dL (ref 0.0–40.0)

## 2019-07-16 LAB — HEPATIC FUNCTION PANEL
ALT: 17 U/L (ref 0–35)
AST: 20 U/L (ref 0–37)
Albumin: 4.3 g/dL (ref 3.5–5.2)
Alkaline Phosphatase: 53 U/L (ref 39–117)
Bilirubin, Direct: 0.1 mg/dL (ref 0.0–0.3)
Total Bilirubin: 0.4 mg/dL (ref 0.2–1.2)
Total Protein: 7.3 g/dL (ref 6.0–8.3)

## 2019-07-16 LAB — HEMOGLOBIN A1C: Hgb A1c MFr Bld: 6.7 % — ABNORMAL HIGH (ref 4.6–6.5)

## 2019-07-16 LAB — FERRITIN: Ferritin: 17.5 ng/mL (ref 10.0–291.0)

## 2019-07-16 LAB — TSH: TSH: 1.34 u[IU]/mL (ref 0.35–4.50)

## 2019-07-17 ENCOUNTER — Other Ambulatory Visit: Payer: Self-pay | Admitting: Internal Medicine

## 2019-07-17 DIAGNOSIS — E7800 Pure hypercholesterolemia, unspecified: Secondary | ICD-10-CM

## 2019-07-17 NOTE — Progress Notes (Signed)
Order placed for liver panel f/u

## 2019-07-22 ENCOUNTER — Other Ambulatory Visit: Payer: Self-pay | Admitting: Internal Medicine

## 2019-07-22 DIAGNOSIS — E871 Hypo-osmolality and hyponatremia: Secondary | ICD-10-CM

## 2019-07-22 NOTE — Progress Notes (Signed)
Order placed for f/u sodium.  ?

## 2019-07-24 ENCOUNTER — Other Ambulatory Visit: Payer: Self-pay

## 2019-07-27 ENCOUNTER — Ambulatory Visit (INDEPENDENT_AMBULATORY_CARE_PROVIDER_SITE_OTHER): Payer: PPO | Admitting: Pharmacist

## 2019-07-27 ENCOUNTER — Other Ambulatory Visit: Payer: Self-pay

## 2019-07-27 ENCOUNTER — Other Ambulatory Visit (INDEPENDENT_AMBULATORY_CARE_PROVIDER_SITE_OTHER): Payer: PPO

## 2019-07-27 DIAGNOSIS — E7800 Pure hypercholesterolemia, unspecified: Secondary | ICD-10-CM

## 2019-07-27 DIAGNOSIS — I10 Essential (primary) hypertension: Secondary | ICD-10-CM | POA: Diagnosis not present

## 2019-07-27 DIAGNOSIS — E119 Type 2 diabetes mellitus without complications: Secondary | ICD-10-CM | POA: Diagnosis not present

## 2019-07-27 DIAGNOSIS — E871 Hypo-osmolality and hyponatremia: Secondary | ICD-10-CM | POA: Diagnosis not present

## 2019-07-27 NOTE — Chronic Care Management (AMB) (Signed)
Chronic Care Management   Follow Up Note   07/27/2019 Name: Michelle Hawkins MRN: [id number] DOB: June 22, 1946  Referred by: Einar Pheasant, MD Reason for referral : Chronic Care Management (Medication Management)   Michelle Hawkins is a 74 y.o. year old female who is a primary care patient of Einar Pheasant, MD. The CCM team was consulted for assistance with chronic disease management and care coordination needs.    Contacted patient for medication management follow up.   Review of patient status, including review of consultants reports, relevant laboratory and other test results, and collaboration with appropriate care team members and the patient's provider was performed as part of comprehensive patient evaluation and provision of chronic care management services.    SDOH (Social Determinants of Health) screening performed today: Physical Activity. See Care Plan for related entries.   Outpatient Encounter Medications as of 07/27/2019  Medication Sig Note  . ACCU-CHEK SOFTCLIX LANCETS lancets Check blood sugar twice daily Dx 250.00   . aspirin 81 MG tablet Take 81 mg by mouth 2 (two) times daily.  08/18/2018: Takes 2 QPM  . atorvastatin (LIPITOR) 20 MG tablet TAKE ONE TABLET EVERY DAY   . Blood Glucose Monitoring Suppl (ONE TOUCH ULTRA SYSTEM KIT) w/Device KIT 1 kit by Does not apply route once.   . calcium citrate (CALCITRATE - DOSED IN MG ELEMENTAL CALCIUM) 950 MG tablet Take 200 mg of elemental calcium by mouth daily.   . cholecalciferol (VITAMIN D) 400 UNITS TABS Take by mouth.   . escitalopram (LEXAPRO) 20 MG tablet TAKE ONE TABLET EVERY DAY   . fish oil-omega-3 fatty acids 1000 MG capsule Take 2 g by mouth daily.   . Iron-Vitamin C 65-125 MG TABS Take 1 tablet by mouth.   . levothyroxine (SYNTHROID) 25 MCG tablet TAKE 1 TABLET EVERY DAY ON EMPTY STOMACHWITH A GLASS OF WATER AT LEAST 30-60 MINBEFORE BREAKFAST   . losartan (COZAAR) 100 MG tablet TAKE 1 TABLET BY MOUTH DAILY   . metFORMIN  (GLUCOPHAGE-XR) 500 MG 24 hr tablet TAKE TWO TABLETS TWICE DAILY   . metoprolol succinate (TOPROL-XL) 50 MG 24 hr tablet TAKE 1 TABLET BY MOUTH DAILY WITH OR FOLLOWING A MEAL   . Multiple Vitamin (MULTIVITAMIN WITH MINERALS) TABS tablet Take 1 tablet by mouth daily.   Marland Kitchen omeprazole (PRILOSEC) 40 MG capsule TAKE 1 CAPSULE BY MOUTH EVERY DAY   . Probiotic Product (PROBIOTIC DAILY PO) Take 1 tablet by mouth.   . TURMERIC PO Take by mouth daily.    . Biotin (BIOTIN 5000) 5 MG CAPS Take by mouth.   Marland Kitchen glucose blood (ONE TOUCH ULTRA TEST) test strip CHECK BLOOD SUGAR UP TO THREE TIMES DAILY Dx E11.9    No facility-administered encounter medications on file as of 07/27/2019.     Objective:   Goals Addressed            This Visit's Progress     Patient Stated   . "I want to stay in good health" (pt-stated)       Current Barriers:  . Diabetes: improved, most recent A1c 6.7% o Concerned about recent labwork showing hyponatremia. Is scheduled for f/u lab work today.  . Current antihyperglycemic regimen: metformin XR 1000 mg BID o Did not tolerate Jardiance d/t reported hair falling out . Cardiovascular risk reduction: o Current hypertensive regimen: losartan 100 mg daily, metoprolol succinate 50 mg daily; BP controlled at recent office visits; up to date on fill hx o Current hyperlipidemia regimen:  atorvastatin 20 mg daily, omega 3 fatty acids 2 g daily; LDL well controlled at 81, TG well controlled at 101 on last check  Pharmacist Clinical Goal(s):  Marland Kitchen Over the next 90 days, patient will work with PharmD and primary care provider to address optimized medication management for diabetes  Interventions: . Comprehensive medication review performed, medication list updated in electronic medical record . Celebrated attaining goal A1c with patient. She notes she is unsure how she did it, as she "still ate sweets" over the holidays. Discussed importance of maintaining appropriate portion sizes and  physical activity, rather than denying herself foods that she likes . Reviewed that Dr. Nicki Reaper was reordering sodium level, and that we will see what recommendations she has based on today's lab work  Patient Self Care Activities:  . Patient will check blood glucose TID, document, and provide at future appointments . Patient will take medications as prescribed . Patient will contact provider with any episodes of hypoglycemia . Patient will report any questions or concerns to provider   Please see past updates related to this goal by clicking on the "Past Updates" button in the selected goal         Plan: - Scheduled f/u call 09/14/19  Catie Darnelle Maffucci, PharmD, BCACP, [place] Pharmacist [place] [phone]

## 2019-07-27 NOTE — Progress Notes (Signed)
Reviewed information.  Agree with assessment and plan.    Dr Tearsa Kowalewski 

## 2019-07-27 NOTE — Patient Instructions (Signed)
Visit Information  Goals Addressed            This Visit's Progress     Patient Stated   . "I want to stay in good health" (pt-stated)       Current Barriers:  . Diabetes: improved, most recent A1c 6.7% o Concerned about recent labwork showing hyponatremia. Is scheduled for f/u lab work today.  . Current antihyperglycemic regimen: metformin XR 1000 mg BID o Did not tolerate Jardiance d/t reported hair falling out . Cardiovascular risk reduction: o Current hypertensive regimen: losartan 100 mg daily, metoprolol succinate 50 mg daily; BP controlled at recent office visits; up to date on fill hx o Current hyperlipidemia regimen: atorvastatin 20 mg daily, omega 3 fatty acids 2 g daily; LDL well controlled at 81, TG well controlled at 101 on last check  Pharmacist Clinical Goal(s):  Marland Kitchen Over the next 90 days, patient will work with PharmD and primary care provider to address optimized medication management for diabetes  Interventions: . Comprehensive medication review performed, medication list updated in electronic medical record . Celebrated attaining goal A1c with patient. She notes she is unsure how she did it, as she "still ate sweets" over the holidays. Discussed importance of maintaining appropriate portion sizes and physical activity, rather than denying herself foods that she likes . Reviewed that Dr. Nicki Reaper was reordering sodium level, and that we will see what recommendations she has based on today's lab work  Patient Self Care Activities:  . Patient will check blood glucose TID, document, and provide at future appointments . Patient will take medications as prescribed . Patient will contact provider with any episodes of hypoglycemia . Patient will report any questions or concerns to provider   Please see past updates related to this goal by clicking on the "Past Updates" button in the selected goal         The patient verbalized understanding of instructions provided today  and declined a print copy of patient instruction materials.  Plan: - Scheduled f/u call 09/14/19  Catie Darnelle Maffucci, PharmD, BCACP, CPP Clinical Pharmacist [place] [phone]

## 2019-07-28 LAB — HEPATIC FUNCTION PANEL
ALT: 19 U/L (ref 0–35)
AST: 20 U/L (ref 0–37)
Albumin: 4.2 g/dL (ref 3.5–5.2)
Alkaline Phosphatase: 67 U/L (ref 39–117)
Bilirubin, Direct: 0.1 mg/dL (ref 0.0–0.3)
Total Bilirubin: 0.4 mg/dL (ref 0.2–1.2)
Total Protein: 6.9 g/dL (ref 6.0–8.3)

## 2019-07-28 LAB — SODIUM: Sodium: 128 mEq/L — ABNORMAL LOW (ref 135–145)

## 2019-07-29 ENCOUNTER — Other Ambulatory Visit
Admission: RE | Admit: 2019-07-29 | Discharge: 2019-07-29 | Disposition: A | Discharge status: Home / Self Care | Payer: PPO | Source: Ambulatory Visit | Attending: Internal Medicine | Admitting: Internal Medicine

## 2019-07-29 ENCOUNTER — Other Ambulatory Visit: Payer: Self-pay | Admitting: Internal Medicine

## 2019-07-29 DIAGNOSIS — E871 Hypo-osmolality and hyponatremia: Secondary | ICD-10-CM

## 2019-07-29 LAB — BASIC METABOLIC PANEL
Anion gap: 10 (ref 5–15)
BUN: 19 mg/dL (ref 8–23)
CO2: 27 mmol/L (ref 22–32)
Calcium: 9.9 mg/dL (ref 8.9–10.3)
Chloride: 97 mmol/L — ABNORMAL LOW (ref 98–111)
Creatinine, Ser: 0.74 mg/dL (ref 0.44–1.00)
GFR calc Af Amer: >60 mL/min (ref >60)
GFR calc non Af Amer: >60 mL/min (ref >60)
Glucose, Bld: 158 mg/dL — ABNORMAL HIGH (ref 70–99)
Potassium: 4 mmol/L (ref 3.5–5.1)
Sodium: 134 mmol/L — ABNORMAL LOW (ref 135–145)

## 2019-07-29 LAB — OSMOLALITY: Osmolality: 287 mOsm/kg (ref 275–295)

## 2019-07-29 LAB — SODIUM, URINE, RANDOM: Sodium, Ur: 68 mmol/L

## 2019-07-29 LAB — OSMOLALITY, URINE: Osmolality, Ur: 656 mOsm/kg (ref 300–900)

## 2019-07-29 NOTE — Progress Notes (Signed)
Orders placed for labs

## 2019-08-11 ENCOUNTER — Ambulatory Visit (INDEPENDENT_AMBULATORY_CARE_PROVIDER_SITE_OTHER): Payer: PPO | Admitting: Internal Medicine

## 2019-08-11 ENCOUNTER — Encounter: Payer: Self-pay | Admitting: Internal Medicine

## 2019-08-11 DIAGNOSIS — D649 Anemia, unspecified: Secondary | ICD-10-CM

## 2019-08-11 DIAGNOSIS — I10 Essential (primary) hypertension: Secondary | ICD-10-CM | POA: Diagnosis not present

## 2019-08-11 DIAGNOSIS — E871 Hypo-osmolality and hyponatremia: Secondary | ICD-10-CM

## 2019-08-11 DIAGNOSIS — Z853 Personal history of malignant neoplasm of breast: Secondary | ICD-10-CM

## 2019-08-11 DIAGNOSIS — D473 Essential (hemorrhagic) thrombocythemia: Secondary | ICD-10-CM

## 2019-08-11 DIAGNOSIS — E119 Type 2 diabetes mellitus without complications: Secondary | ICD-10-CM | POA: Diagnosis not present

## 2019-08-11 DIAGNOSIS — E7800 Pure hypercholesterolemia, unspecified: Secondary | ICD-10-CM | POA: Diagnosis not present

## 2019-08-11 DIAGNOSIS — E039 Hypothyroidism, unspecified: Secondary | ICD-10-CM | POA: Diagnosis not present

## 2019-08-11 DIAGNOSIS — K219 Gastro-esophageal reflux disease without esophagitis: Secondary | ICD-10-CM | POA: Diagnosis not present

## 2019-08-11 DIAGNOSIS — D75839 Thrombocytosis, unspecified: Secondary | ICD-10-CM

## 2019-08-11 NOTE — Progress Notes (Signed)
Patient ID: Michelle Hawkins, female   DOB: [birth year], 74 y.o.   MRN: [id number]   Virtual Visit via telephone Note  This visit type was conducted due to national recommendations for restrictions regarding the COVID-19 pandemic (e.g. social distancing).  This format is felt to be most appropriate for this patient at this time.  All issues noted in this document were discussed and addressed.  No physical exam was performed (except for noted visual exam findings with Video Visits).   I connected with Benetta Spar by telephone and verified that I am speaking with the correct person using two identifiers. Location patient: home Location provider: work  Persons participating in the telephone visit: patient, provider  The limitations, risks, security and privacy concerns of performing an evaluation and management service by telephone and the availability of in person appointments have been discussed. The patient expressed understanding and agreed to proceed.   Reason for visit: scheduled follow up.   HPI: She reports she is doing well.  Feels good.  Staying in due to covid restrictions.  No chest pain.  Breathing stable.  Some am allergy/sinus issues.  Uses saline nasal spray.  No significant chest congestion or cough.  No acid reflux reported.  No abdominal pain.  The last 3-4 weeks, bowels more normal.  a1c on recent check 6.7.  Discussed diet and exercise.  She mentioned obtaining a Libre meter. Recent lab revealed decrease sodium.  Last check improved - 134.  No headache or light headedness reported.     ROS: See pertinent positives and negatives per HPI.  Past Medical History:  Diagnosis Date  . Allergic rhinitis   . Anxiety   . Breast cancer ([place]) 1996   s/p left breast lumpectomy (lymph node dissection - 2/11 positive), chemotherapy  . Breast cancer ([place]) 1995   right breast lumpectomy  . Depression   . Diabetes mellitus   . Dysphagia   . GERD (gastroesophageal reflux disease)   . H/O  ulcer disease    PUD  . Hypercholesterolemia   . Hypertension   . Hypothyroidism    multinodular goiter  . Nephrolithiasis   . Scoliosis   . Skin cancer   . SVT (supraventricular tachycardia) ([facility])     Past Surgical History:  Procedure Laterality Date  . ABDOMINAL HYSTERECTOMY     partial  . BREAST BIOPSY Left 09/2012   benign  . BREAST LUMPECTOMY Left 1996   lumpectomy with chemo and rad tx for breast ca (lymph node dissection - 2/11 positive  . BREAST LUMPECTOMY Right 1995   lumpectomy only  . BREAST LUMPECTOMY WITH AXILLARY LYMPH NODE DISSECTION  1996   left  . COLONOSCOPY WITH PROPOFOL N/A 06/03/2019   Procedure: COLONOSCOPY WITH PROPOFOL;  Surgeon: Robert Bellow, MD;  Location: [facility] ENDOSCOPY;  Service: Endoscopy;  Laterality: N/A;  . ESOPHAGOGASTRODUODENOSCOPY N/A 06/03/2019   Procedure: ESOPHAGOGASTRODUODENOSCOPY (EGD);  Surgeon: Robert Bellow, MD;  Location: [facility] ENDOSCOPY;  Service: Endoscopy;  Laterality: N/A;  . LUMBAR LAMINECTOMY  3/07   L5-S1    Family History  Problem Relation Age of Onset  . Heart disease Father   . Hypertension Father   . Diabetes Father   . Alzheimer's disease Mother   . Diabetes Sister   . Diabetes Sister   . Diabetes Sister   . Diabetes Sister     SOCIAL HX: reviewed.    Current Outpatient Medications:  .  ACCU-CHEK SOFTCLIX LANCETS lancets, Check blood sugar twice daily Dx  250.00, Disp: 200 each, Rfl: 1 .  aspirin 81 MG tablet, Take 81 mg by mouth 2 (two) times daily. , Disp: , Rfl:  .  atorvastatin (LIPITOR) 20 MG tablet, TAKE ONE TABLET EVERY DAY, Disp: 90 tablet, Rfl: 2 .  Biotin (BIOTIN 5000) 5 MG CAPS, Take by mouth., Disp: , Rfl:  .  Blood Glucose Monitoring Suppl (ONE TOUCH ULTRA SYSTEM KIT) w/Device KIT, 1 kit by Does not apply route once., Disp: , Rfl:  .  calcium citrate (CALCITRATE - DOSED IN MG ELEMENTAL CALCIUM) 950 MG tablet, Take 200 mg of elemental calcium by mouth daily., Disp: , Rfl:  .   cholecalciferol (VITAMIN D) 400 UNITS TABS, Take by mouth., Disp: , Rfl:  .  escitalopram (LEXAPRO) 20 MG tablet, TAKE ONE TABLET EVERY DAY, Disp: 90 tablet, Rfl: 1 .  ferrous sulfate 325 (65 FE) MG EC tablet, Take 325 mg by mouth daily with breakfast., Disp: , Rfl:  .  fish oil-omega-3 fatty acids 1000 MG capsule, Take 2 g by mouth daily., Disp: , Rfl:  .  glucose blood (ONE TOUCH ULTRA TEST) test strip, CHECK BLOOD SUGAR UP TO THREE TIMES DAILY Dx E11.9, Disp: 300 each, Rfl: 3 .  levothyroxine (SYNTHROID) 25 MCG tablet, TAKE 1 TABLET EVERY DAY ON EMPTY STOMACHWITH A GLASS OF WATER AT LEAST 30-60 MINBEFORE BREAKFAST, Disp: 90 tablet, Rfl: 1 .  losartan (COZAAR) 100 MG tablet, TAKE 1 TABLET BY MOUTH DAILY, Disp: 90 tablet, Rfl: 1 .  metFORMIN (GLUCOPHAGE-XR) 500 MG 24 hr tablet, TAKE TWO TABLETS TWICE DAILY, Disp: 360 tablet, Rfl: 3 .  metoprolol succinate (TOPROL-XL) 50 MG 24 hr tablet, TAKE 1 TABLET BY MOUTH DAILY WITH OR FOLLOWING A MEAL, Disp: 90 tablet, Rfl: 1 .  Multiple Vitamin (MULTIVITAMIN WITH MINERALS) TABS tablet, Take 1 tablet by mouth daily., Disp: , Rfl:  .  omeprazole (PRILOSEC) 40 MG capsule, TAKE 1 CAPSULE BY MOUTH EVERY DAY, Disp: 90 capsule, Rfl: 1 .  Probiotic Product (PROBIOTIC DAILY PO), Take 1 tablet by mouth., Disp: , Rfl:  .  TURMERIC PO, Take by mouth daily. , Disp: , Rfl:   EXAM:  GENERAL: alert.  Sounds to be in no acute distress.  Answering questions appropriately.   PSYCH/NEURO: pleasant and cooperative, no obvious depression or anxiety, speech and thought processing grossly intact  ASSESSMENT AND PLAN:  Discussed the following assessment and plan:  Anemia hgb wnl 10/2018.  Follow cbc.   Diabetes mellitus Low carb diet and exercise.  Last a1c 6.7.  Follow met b and a1c.    GERD (gastroesophageal reflux disease) EGD 05/2019 - reactive gastritis.  Symptoms controlled on omeprazole.    History of breast cancer Mammogram 12/22/18 - Birads 0.  F/u left breast  mammogram - Birads II.  Recommended yearly f/u mammogram.    Hypercholesterolemia On lipitor.  Low cholesterol diet and exercise.  Follow lipid panel and liver function tests.    Hypertension Blood pressure has been under reasonable control.  Continue current medication regimen.  See if can spot check.  Follow pressures.  Follow metabolic panel.    Hyponatremia Last sodium 134.  Follow.    Hypothyroidism On thyroid replacement.  Follow tsh.   Thrombocytosis Last platelet count wnl - 10/2018.  Follow.     Orders Placed This Encounter  Procedures  . CBC with Differential/Platelet    Standing Status:   Future    Standing Expiration Date:   08/15/2020  . Hemoglobin A1c  Standing Status:   Future    Standing Expiration Date:   08/15/2020  . Hepatic function panel    Standing Status:   Future    Standing Expiration Date:   08/15/2020  . Lipid panel    Standing Status:   Future    Standing Expiration Date:   08/15/2020  . Basic metabolic panel    Standing Status:   Future    Standing Expiration Date:   08/15/2020  . Microalbumin / creatinine urine ratio    Standing Status:   Future    Standing Expiration Date:   08/15/2020     I discussed the assessment and treatment plan with the patient. The patient was provided an opportunity to ask questions and all were answered. The patient agreed with the plan and demonstrated an understanding of the instructions.   The patient was advised to call back or seek an in-person evaluation if the symptoms worsen or if the condition fails to improve as anticipated.  I provided 22 minutes of non-face-to-face time during this encounter.   Einar Pheasant, MD

## 2019-08-16 ENCOUNTER — Telehealth: Payer: Self-pay | Admitting: Internal Medicine

## 2019-08-16 ENCOUNTER — Encounter: Payer: Self-pay | Admitting: Internal Medicine

## 2019-08-16 NOTE — Assessment & Plan Note (Signed)
Low carb diet and exercise.  Last a1c 6.7.  Follow met b and a1c.

## 2019-08-16 NOTE — Assessment & Plan Note (Signed)
EGD 05/2019 - reactive gastritis.  Symptoms controlled on omeprazole.

## 2019-08-16 NOTE — Assessment & Plan Note (Signed)
Blood pressure has been under reasonable control.  Continue current medication regimen.  See if can spot check.  Follow pressures.  Follow metabolic panel.

## 2019-08-16 NOTE — Assessment & Plan Note (Signed)
Last sodium 134.  Follow.

## 2019-08-16 NOTE — Assessment & Plan Note (Signed)
Last platelet count wnl - 10/2018.  Follow.

## 2019-08-16 NOTE — Assessment & Plan Note (Signed)
Mammogram 12/22/18 - Birads 0.  F/u left breast mammogram - Birads II.  Recommended yearly f/u mammogram.

## 2019-08-16 NOTE — Telephone Encounter (Signed)
Pt was interested in getting Libre glucose sensor.  I was not sure if insurance would cover since not on insulin.  Let me know what I need to do.  Thank you for your help.   Einar Pheasant

## 2019-08-16 NOTE — Assessment & Plan Note (Signed)
On lipitor.  Low cholesterol diet and exercise.  Follow lipid panel and liver function tests.   

## 2019-08-16 NOTE — Assessment & Plan Note (Signed)
hgb wnl 10/2018.  Follow cbc.

## 2019-08-16 NOTE — Assessment & Plan Note (Signed)
On thyroid replacement.  Follow tsh.  

## 2019-08-17 ENCOUNTER — Ambulatory Visit (INDEPENDENT_AMBULATORY_CARE_PROVIDER_SITE_OTHER): Payer: PPO | Admitting: Pharmacist

## 2019-08-17 DIAGNOSIS — E119 Type 2 diabetes mellitus without complications: Secondary | ICD-10-CM

## 2019-08-17 MED ORDER — FREESTYLE LIBRE 14 DAY SENSOR MISC: 1.0000 | 3 refills | Status: AC

## 2019-08-17 MED ORDER — FREESTYLE LIBRE 14 DAY READER DEVI: 1.0000 | 3 refills | Status: AC

## 2019-08-17 NOTE — Telephone Encounter (Signed)
I have had some instances where her insurance will cover w/o meeting the insulin criteria. Will outreach her to discuss.

## 2019-08-17 NOTE — Patient Instructions (Signed)
Visit Information  Goals Addressed            This Visit's Progress     Patient Stated   . "I want to stay in good health" (pt-stated)       Current Barriers:  . Diabetes: improved, most recent A1c 6.7% o Requested FreeStyle Libre CGM at last PCP visit  . Current antihyperglycemic regimen: metformin XR 1000 mg BID o Did not tolerate Jardiance d/t reported hair falling out . Cardiovascular risk reduction: o Current hypertensive regimen: losartan 100 mg daily, metoprolol succinate 50 mg daily; BP controlled at recent office visits; up to date on fill hx o Current hyperlipidemia regimen: atorvastatin 20 mg daily, omega 3 fatty acids 2 g daily; LDL well controlled at 81, TG well controlled at 101 on last check  Pharmacist Clinical Goal(s):  Marland Kitchen Over the next 90 days, patient will work with PharmD and primary care provider to address optimized medication management for diabetes  Interventions: . Placed order for FreeStyle Libre CGM reader and sensor. Patient may elect to use her phone app as the reader - LibreLink.  Kandice Robinsons the pharmacy. Prescription did run through for $0. As we are not able to do face to face visits right now, I asked that the pharmacist help patient place her first sensor and set up app. She noted that she would do so. I will also collaborate with office staff to see if a nurse visit for assistance in setting up and placing would be an option, given pandemic restrictions.   Patient Self Care Activities:  . Patient will check blood glucose TID, document, and provide at future appointments . Patient will take medications as prescribed . Patient will contact provider with any episodes of hypoglycemia . Patient will report any questions or concerns to provider   Please see past updates related to this goal by clicking on the "Past Updates" button in the selected goal         The patient verbalized understanding of instructions provided today and declined a print  copy of patient instruction materials.   Plan:  - Will collaborate w/ patient and clinical staff as above  Catie Darnelle Maffucci, PharmD, [place], [place] Pharmacist [place] [phone]

## 2019-08-17 NOTE — Chronic Care Management (AMB) (Signed)
Chronic Care Management   Follow Up Note   08/17/2019 Name: Michelle Hawkins MRN: [id number] DOB: 1945/09/28  Referred by: Einar Pheasant, MD Reason for referral : Chronic Care Management (Medication Management)   Michelle Hawkins is a 74 y.o. year old female who is a primary care patient of Einar Pheasant, MD. The CCM team was consulted for assistance with chronic disease management and care coordination needs.    Received request from provider for medication monitoring access.   Review of patient status, including review of consultants reports, relevant laboratory and other test results, and collaboration with appropriate care team members and the patient's provider was performed as part of comprehensive patient evaluation and provision of chronic care management services.    SDOH (Social Determinants of Health) screening performed today: None. See Care Plan for related entries.   Outpatient Encounter Medications as of 08/17/2019  Medication Sig Note  . ACCU-CHEK SOFTCLIX LANCETS lancets Check blood sugar twice daily Dx 250.00   . aspirin 81 MG tablet Take 81 mg by mouth 2 (two) times daily.  08/18/2018: Takes 2 QPM  . atorvastatin (LIPITOR) 20 MG tablet TAKE ONE TABLET EVERY DAY   . Biotin (BIOTIN 5000) 5 MG CAPS Take by mouth.   . Blood Glucose Monitoring Suppl (ONE TOUCH ULTRA SYSTEM KIT) w/Device KIT 1 kit by Does not apply route once.   . calcium citrate (CALCITRATE - DOSED IN MG ELEMENTAL CALCIUM) 950 MG tablet Take 200 mg of elemental calcium by mouth daily.   . cholecalciferol (VITAMIN D) 400 UNITS TABS Take by mouth.   . Continuous Blood Gluc Receiver (FREESTYLE LIBRE 14 DAY READER) DEVI Inject 1 Device into the skin every 14 (fourteen) days. Place 1 sensor every 14 days. Use to check sugar at least 4 times daily   . Continuous Blood Gluc Sensor (FREESTYLE LIBRE 14 DAY SENSOR) MISC Inject 1 Device into the skin every 14 (fourteen) days.   Marland Kitchen escitalopram (LEXAPRO) 20 MG tablet TAKE ONE  TABLET EVERY DAY   . ferrous sulfate 325 (65 FE) MG EC tablet Take 325 mg by mouth daily with breakfast.   . fish oil-omega-3 fatty acids 1000 MG capsule Take 2 g by mouth daily.   Marland Kitchen glucose blood (ONE TOUCH ULTRA TEST) test strip CHECK BLOOD SUGAR UP TO THREE TIMES DAILY Dx E11.9   . levothyroxine (SYNTHROID) 25 MCG tablet TAKE 1 TABLET EVERY DAY ON EMPTY STOMACHWITH A GLASS OF WATER AT LEAST 30-60 MINBEFORE BREAKFAST   . losartan (COZAAR) 100 MG tablet TAKE 1 TABLET BY MOUTH DAILY   . metFORMIN (GLUCOPHAGE-XR) 500 MG 24 hr tablet TAKE TWO TABLETS TWICE DAILY   . metoprolol succinate (TOPROL-XL) 50 MG 24 hr tablet TAKE 1 TABLET BY MOUTH DAILY WITH OR FOLLOWING A MEAL   . Multiple Vitamin (MULTIVITAMIN WITH MINERALS) TABS tablet Take 1 tablet by mouth daily.   Marland Kitchen omeprazole (PRILOSEC) 40 MG capsule TAKE 1 CAPSULE BY MOUTH EVERY DAY   . Probiotic Product (PROBIOTIC DAILY PO) Take 1 tablet by mouth.   . TURMERIC PO Take by mouth daily.     No facility-administered encounter medications on file as of 08/17/2019.     Objective:   Goals Addressed            This Visit's Progress     Patient Stated   . "I want to stay in good health" (pt-stated)       Current Barriers:  . Diabetes: improved, most recent A1c 6.7%  o Requested FreeStyle Libre CGM at last PCP visit  . Current antihyperglycemic regimen: metformin XR 1000 mg BID o Did not tolerate Jardiance d/t reported hair falling out . Cardiovascular risk reduction: o Current hypertensive regimen: losartan 100 mg daily, metoprolol succinate 50 mg daily; BP controlled at recent office visits; up to date on fill hx o Current hyperlipidemia regimen: atorvastatin 20 mg daily, omega 3 fatty acids 2 g daily; LDL well controlled at 81, TG well controlled at 101 on last check  Pharmacist Clinical Goal(s):  Marland Kitchen Over the next 90 days, patient will work with PharmD and primary care provider to address optimized medication management for  diabetes  Interventions: . Placed order for FreeStyle Libre CGM reader and sensor. Patient may elect to use her phone app as the reader - LibreLink.  Kandice Robinsons the pharmacy. Prescription did run through for $0. As we are not able to do face to face visits right now, I asked that the pharmacist help patient place her first sensor and set up app. She noted that she would do so. I will also collaborate with office staff to see if a nurse visit for assistance in setting up and placing would be an option, given pandemic restrictions.   Patient Self Care Activities:  . Patient will check blood glucose TID, document, and provide at future appointments . Patient will take medications as prescribed . Patient will contact provider with any episodes of hypoglycemia . Patient will report any questions or concerns to provider   Please see past updates related to this goal by clicking on the "Past Updates" button in the selected goal          Plan:  - Will collaborate w/ patient and clinical staff as above  Catie Darnelle Maffucci, PharmD, [place], [place] Pharmacist [facility] [phone]

## 2019-08-17 NOTE — Telephone Encounter (Signed)
Thank you :)

## 2019-08-18 NOTE — Progress Notes (Signed)
Reviewed information.  Agree with plan.    Dr Marketia Stallsmith 

## 2019-09-07 ENCOUNTER — Other Ambulatory Visit: Payer: Self-pay | Admitting: Internal Medicine

## 2019-09-14 ENCOUNTER — Telehealth: Payer: PPO

## 2019-09-14 ENCOUNTER — Ambulatory Visit: Payer: Self-pay | Admitting: Pharmacist

## 2019-09-14 NOTE — Chronic Care Management (AMB) (Signed)
  Chronic Care Management   Note  09/14/2019 Name: KALAHNI SPRATT MRN: [id number] DOB: February 16, 1946  SATURN HABERLAND is a 74 y.o. year old female who is a primary care patient of Einar Pheasant, MD. The CCM team was consulted for assistance with chronic disease management and care coordination needs.   Attempted to contact patient for medication management review. Left HIPAA compliant message for her to return my call at his convenience.   Follow up plan: - Will collaborate w/ Care Guide to outreach for scheduling  Catie Darnelle Maffucci, PharmD, [place], [place] Pharmacist [place] [phone]

## 2019-09-15 ENCOUNTER — Telehealth: Payer: Self-pay | Admitting: Internal Medicine

## 2019-09-15 NOTE — Progress Notes (Signed)
Reviewed.    Dr Lawrence Roldan 

## 2019-09-15 NOTE — Chronic Care Management (AMB) (Signed)
  Care Management   Note  09/15/2019 Name: Michelle Hawkins MRN: [id number] DOB: [birth year]  Michelle Hawkins is a 74 y.o. year old female who is a primary care patient of Einar Pheasant, MD and is actively engaged with the care management team. I reached out to [name] by phone today to assist with re-scheduling a follow up visit with the Pharmacist  Follow up plan: Patient declines further follow up and engagement by the care management team. Appropriate care team members and provider have been notified via electronic communication.   Noreene Larsson, [place], [place], [place] [address] Direct Dial: [phone] [email or web address] Website: [email or web address]

## 2019-09-29 ENCOUNTER — Other Ambulatory Visit: Payer: Self-pay | Admitting: Internal Medicine

## 2019-10-20 ENCOUNTER — Telehealth: Payer: Self-pay | Admitting: Internal Medicine

## 2019-10-20 NOTE — Telephone Encounter (Signed)
Left message for patient to call back and schedule Medicare Annual Wellness Visit (AWV) either virtually or audio only.  Last AWV 09/20/16; please schedule at anytime with Denisa O'Brien-Blaney at [facility].

## 2019-11-12 ENCOUNTER — Other Ambulatory Visit: Payer: Self-pay | Admitting: Internal Medicine

## 2019-11-30 ENCOUNTER — Other Ambulatory Visit: Payer: PPO

## 2019-12-02 ENCOUNTER — Encounter: Payer: PPO | Admitting: Internal Medicine

## 2019-12-02 ENCOUNTER — Other Ambulatory Visit: Payer: Self-pay | Admitting: Internal Medicine

## 2019-12-02 DIAGNOSIS — Z1231 Encounter for screening mammogram for malignant neoplasm of breast: Secondary | ICD-10-CM

## 2019-12-08 ENCOUNTER — Other Ambulatory Visit: Payer: Self-pay | Admitting: Internal Medicine

## 2020-01-04 ENCOUNTER — Ambulatory Visit: Payer: PPO

## 2020-01-04 ENCOUNTER — Other Ambulatory Visit: Payer: Self-pay

## 2020-01-04 ENCOUNTER — Other Ambulatory Visit (INDEPENDENT_AMBULATORY_CARE_PROVIDER_SITE_OTHER): Payer: PPO

## 2020-01-04 DIAGNOSIS — E7800 Pure hypercholesterolemia, unspecified: Secondary | ICD-10-CM | POA: Diagnosis not present

## 2020-01-04 DIAGNOSIS — I10 Essential (primary) hypertension: Secondary | ICD-10-CM

## 2020-01-04 DIAGNOSIS — E119 Type 2 diabetes mellitus without complications: Secondary | ICD-10-CM | POA: Diagnosis not present

## 2020-01-04 LAB — CBC WITH DIFFERENTIAL/PLATELET
Basophils Absolute: 0.1 10*3/uL (ref 0.0–0.1)
Basophils Relative: 0.9 % (ref 0.0–3.0)
Eosinophils Absolute: 0.6 10*3/uL (ref 0.0–0.7)
Eosinophils Relative: 6.6 % — ABNORMAL HIGH (ref 0.0–5.0)
HCT: 34.6 % — ABNORMAL LOW (ref 36.0–46.0)
Hemoglobin: 11.7 g/dL — ABNORMAL LOW (ref 12.0–15.0)
Lymphocytes Relative: 30.9 % (ref 12.0–46.0)
Lymphs Abs: 2.8 10*3/uL (ref 0.7–4.0)
MCHC: 33.7 g/dL (ref 30.0–36.0)
MCV: 90.4 fl (ref 78.0–100.0)
Monocytes Absolute: 0.9 10*3/uL (ref 0.1–1.0)
Monocytes Relative: 9.5 % (ref 3.0–12.0)
Neutro Abs: 4.8 10*3/uL (ref 1.4–7.7)
Neutrophils Relative %: 52.1 % (ref 43.0–77.0)
Platelets: 319 10*3/uL (ref 150.0–400.0)
RBC: 3.82 Mil/uL — ABNORMAL LOW (ref 3.87–5.11)
RDW: 13.6 % (ref 11.5–15.5)
WBC: 9.2 10*3/uL (ref 4.0–10.5)

## 2020-01-04 LAB — BASIC METABOLIC PANEL
BUN: 10 mg/dL (ref 6–23)
CO2: 29 mEq/L (ref 19–32)
Calcium: 9.6 mg/dL (ref 8.4–10.5)
Chloride: 98 mEq/L (ref 96–112)
Creatinine, Ser: 0.68 mg/dL (ref 0.40–1.20)
GFR: 84.4 mL/min (ref >60.00)
Glucose, Bld: 119 mg/dL — ABNORMAL HIGH (ref 70–99)
Potassium: 4.6 mEq/L (ref 3.5–5.1)
Sodium: 134 mEq/L — ABNORMAL LOW (ref 135–145)

## 2020-01-04 LAB — HEPATIC FUNCTION PANEL
ALT: 21 U/L (ref 0–35)
AST: 18 U/L (ref 0–37)
Albumin: 4.2 g/dL (ref 3.5–5.2)
Alkaline Phosphatase: 56 U/L (ref 39–117)
Bilirubin, Direct: 0.1 mg/dL (ref 0.0–0.3)
Total Bilirubin: 0.4 mg/dL (ref 0.2–1.2)
Total Protein: 6.7 g/dL (ref 6.0–8.3)

## 2020-01-04 LAB — LIPID PANEL
Cholesterol: 140 mg/dL (ref 0–200)
HDL: 47.9 mg/dL (ref >39.00)
LDL Cholesterol: 72 mg/dL (ref 0–99)
NonHDL: 91.73
Total CHOL/HDL Ratio: 3
Triglycerides: 99 mg/dL (ref 0.0–149.0)
VLDL: 19.8 mg/dL (ref 0.0–40.0)

## 2020-01-04 LAB — MICROALBUMIN / CREATININE URINE RATIO
Creatinine,U: 65.3 mg/dL
Microalb Creat Ratio: 1.1 mg/g (ref 0.0–30.0)
Microalb, Ur: 0.7 mg/dL (ref 0.0–1.9)

## 2020-01-04 LAB — HEMOGLOBIN A1C: Hgb A1c MFr Bld: 6.9 % — ABNORMAL HIGH (ref 4.6–6.5)

## 2020-01-05 ENCOUNTER — Other Ambulatory Visit: Payer: Self-pay | Admitting: Internal Medicine

## 2020-01-05 ENCOUNTER — Other Ambulatory Visit (INDEPENDENT_AMBULATORY_CARE_PROVIDER_SITE_OTHER): Payer: PPO

## 2020-01-05 DIAGNOSIS — D649 Anemia, unspecified: Secondary | ICD-10-CM | POA: Diagnosis not present

## 2020-01-05 LAB — IBC + FERRITIN
Ferritin: 21.2 ng/mL (ref 10.0–291.0)
Iron: 81 ug/dL (ref 42–145)
Saturation Ratios: 17.7 % — ABNORMAL LOW (ref 20.0–50.0)
Transferrin: 327 mg/dL (ref 212.0–360.0)

## 2020-01-05 LAB — VITAMIN B12: Vitamin B-12: 569 pg/mL (ref 211–911)

## 2020-01-05 NOTE — Progress Notes (Signed)
Order placed for add on labs.   °

## 2020-01-06 ENCOUNTER — Other Ambulatory Visit: Payer: Self-pay

## 2020-01-06 ENCOUNTER — Ambulatory Visit
Admission: RE | Admit: 2020-01-06 | Discharge: 2020-01-06 | Disposition: A | Discharge status: Home / Self Care | Payer: PPO | Source: Ambulatory Visit | Attending: Internal Medicine | Admitting: Internal Medicine

## 2020-01-06 ENCOUNTER — Ambulatory Visit (INDEPENDENT_AMBULATORY_CARE_PROVIDER_SITE_OTHER): Payer: PPO | Admitting: Internal Medicine

## 2020-01-06 DIAGNOSIS — K219 Gastro-esophageal reflux disease without esophagitis: Secondary | ICD-10-CM

## 2020-01-06 DIAGNOSIS — E039 Hypothyroidism, unspecified: Secondary | ICD-10-CM | POA: Diagnosis not present

## 2020-01-06 DIAGNOSIS — R2681 Unsteadiness on feet: Secondary | ICD-10-CM | POA: Diagnosis not present

## 2020-01-06 DIAGNOSIS — D75839 Thrombocytosis, unspecified: Secondary | ICD-10-CM

## 2020-01-06 DIAGNOSIS — D473 Essential (hemorrhagic) thrombocythemia: Secondary | ICD-10-CM | POA: Diagnosis not present

## 2020-01-06 DIAGNOSIS — Z0000 Encounter for general adult medical examination without abnormal findings: Secondary | ICD-10-CM

## 2020-01-06 DIAGNOSIS — Z1231 Encounter for screening mammogram for malignant neoplasm of breast: Secondary | ICD-10-CM | POA: Diagnosis not present

## 2020-01-06 DIAGNOSIS — Z853 Personal history of malignant neoplasm of breast: Secondary | ICD-10-CM | POA: Diagnosis not present

## 2020-01-06 DIAGNOSIS — E7800 Pure hypercholesterolemia, unspecified: Secondary | ICD-10-CM

## 2020-01-06 DIAGNOSIS — D649 Anemia, unspecified: Secondary | ICD-10-CM | POA: Diagnosis not present

## 2020-01-06 DIAGNOSIS — E1165 Type 2 diabetes mellitus with hyperglycemia: Secondary | ICD-10-CM

## 2020-01-06 DIAGNOSIS — W19XXXA Unspecified fall, initial encounter: Secondary | ICD-10-CM

## 2020-01-06 DIAGNOSIS — I10 Essential (primary) hypertension: Secondary | ICD-10-CM | POA: Diagnosis not present

## 2020-01-06 HISTORY — DX: Personal history of irradiation: Z92.3

## 2020-01-06 HISTORY — DX: Personal history of antineoplastic chemotherapy: Z92.21

## 2020-01-06 NOTE — Assessment & Plan Note (Addendum)
Physical today 01/06/20.  Mammogram 12/22/18 - Birads I.  Scheduled for mammogram today.  Colonoscopy 10/2012 - normal.  Recommended f/u in 10 years.

## 2020-01-06 NOTE — Progress Notes (Signed)
Subjective:    Patient ID: Michelle Hawkins, female    DOB: 10-18-1945, 74 y.o.   MRN: [id number]  HPI This visit occurred during the SARS-CoV-2 public health emergency.  Safety protocols were in place, including screening questions prior to the visit, additional usage of staff PPE, and extensive cleaning of exam room while observing appropriate contact time as indicated for disinfecting solutions.  Patient here for her physical exam.  She reports she is doing well.  Feels good.  Recently went on a trip to [place] and [place].  Tries to stay active.  No chest pain or sob reported.  No abdominal pain or bowel change reported.  Did fall last night.  Hit her right knee, right elbow and forehead.  No headache.  No dizziness or light headedness.  No residual injury or problems.  Some unsteadiness.  Discussed therapy for balance and gait.  a1c 6.9.  Sugars doing ok.    Past Medical History:  Diagnosis Date  . Allergic rhinitis   . Anxiety   . Breast cancer ([place]) 1996   s/p left breast lumpectomy (lymph node dissection - 2/11 positive), chemotherapy  . Breast cancer ([place]) 1995   right breast lumpectomy  . Depression   . Diabetes mellitus   . Dysphagia   . GERD (gastroesophageal reflux disease)   . H/O ulcer disease    PUD  . Hypercholesterolemia   . Hypertension   . Hypothyroidism    multinodular goiter  . Nephrolithiasis   . Personal history of chemotherapy   . Personal history of radiation therapy   . Scoliosis   . Skin cancer   . SVT (supraventricular tachycardia) (HCC)    Past Surgical History:  Procedure Laterality Date  . ABDOMINAL HYSTERECTOMY     partial  . BREAST BIOPSY Left 09/2012   benign  . BREAST LUMPECTOMY Left 1996   lumpectomy with chemo and rad tx for breast ca (lymph node dissection - 2/11 positive  . BREAST LUMPECTOMY Right 1995   lumpectomy only  . BREAST LUMPECTOMY WITH AXILLARY LYMPH NODE DISSECTION  1996   left  . COLONOSCOPY WITH PROPOFOL N/A  06/03/2019   Procedure: COLONOSCOPY WITH PROPOFOL;  Surgeon: Robert Bellow, MD;  Location: [facility] ENDOSCOPY;  Service: Endoscopy;  Laterality: N/A;  . ESOPHAGOGASTRODUODENOSCOPY N/A 06/03/2019   Procedure: ESOPHAGOGASTRODUODENOSCOPY (EGD);  Surgeon: Robert Bellow, MD;  Location: [facility] ENDOSCOPY;  Service: Endoscopy;  Laterality: N/A;  . LUMBAR LAMINECTOMY  3/07   L5-S1   Family History  Problem Relation Age of Onset  . Heart disease Father   . Hypertension Father   . Diabetes Father   . Alzheimer's disease Mother   . Diabetes Sister   . Diabetes Sister   . Diabetes Sister   . Diabetes Sister    Social History   Socioeconomic History  . Marital status: Married    Spouse name: Not on file  . Number of children: Not on file  . Years of education: Not on file  . Highest education level: Not on file  Occupational History  . Not on file  Tobacco Use  . Smoking status: Former Smoker    Packs/day: 0.50    Years: 40.00    Pack years: 20.00    Types: Cigarettes    Quit date: 04/14/2004    Years since quitting: 15.7  . Smokeless tobacco: Never Used  Vaping Use  . Vaping Use: Never used  Substance and Sexual Activity  . Alcohol use:  No    Alcohol/week: 0.0 standard drinks  . Drug use: No  . Sexual activity: Never  Other Topics Concern  . Not on file  Social History Narrative  . Not on file   Social Determinants of Health   Financial Resource Strain:   . Difficulty of Paying Living Expenses:   Food Insecurity:   . Worried About Charity fundraiser in the Last Year:   . Arboriculturist in the Last Year:   Transportation Needs:   . Film/video editor (Medical):   Marland Kitchen Lack of Transportation (Non-Medical):   Physical Activity:   . Days of Exercise per Week:   . Minutes of Exercise per Session:   Stress:   . Feeling of Stress :   Social Connections:   . Frequency of Communication with Friends and Family:   . Frequency of Social Gatherings with Friends and  Family:   . Attends Religious Services:   . Active Member of Clubs or Organizations:   . Attends Archivist Meetings:   Marland Kitchen Marital Status:     Outpatient Encounter Medications as of 01/06/2020  Medication Sig  . ACCU-CHEK SOFTCLIX LANCETS lancets Check blood sugar twice daily Dx 250.00  . aspirin 81 MG tablet Take 81 mg by mouth 2 (two) times daily.   Marland Kitchen atorvastatin (LIPITOR) 20 MG tablet TAKE ONE TABLET EVERY DAY  . Biotin (BIOTIN 5000) 5 MG CAPS Take by mouth.  . Blood Glucose Monitoring Suppl (ONE TOUCH ULTRA SYSTEM KIT) w/Device KIT 1 kit by Does not apply route once.  . calcium citrate (CALCITRATE - DOSED IN MG ELEMENTAL CALCIUM) 950 MG tablet Take 200 mg of elemental calcium by mouth daily.  . cholecalciferol (VITAMIN D) 400 UNITS TABS Take by mouth.  . Continuous Blood Gluc Receiver (FREESTYLE LIBRE 14 DAY READER) DEVI Inject 1 Device into the skin every 14 (fourteen) days. Place 1 sensor every 14 days. Use to check sugar at least 4 times daily  . Continuous Blood Gluc Sensor (FREESTYLE LIBRE 14 DAY SENSOR) MISC Inject 1 Device into the skin every 14 (fourteen) days.  Marland Kitchen escitalopram (LEXAPRO) 20 MG tablet TAKE ONE TABLET EVERY DAY  . ferrous sulfate 325 (65 FE) MG EC tablet Take 325 mg by mouth daily with breakfast.  . fish oil-omega-3 fatty acids 1000 MG capsule Take 2 g by mouth daily.  Marland Kitchen glucose blood (ONE TOUCH ULTRA TEST) test strip CHECK BLOOD SUGAR UP TO THREE TIMES DAILY Dx E11.9  . levothyroxine (SYNTHROID) 25 MCG tablet TAKE 1 TABLET EVERY DAY ON EMPTY STOMACHWITH A GLASS OF WATER AT LEAST 30-60 MINBEFORE BREAKFAST  . losartan (COZAAR) 100 MG tablet TAKE 1 TABLET BY MOUTH DAILY  . metFORMIN (GLUCOPHAGE-XR) 500 MG 24 hr tablet TAKE TWO TABLETS TWICE DAILY  . metoprolol succinate (TOPROL-XL) 50 MG 24 hr tablet TAKE 1 TABLET BY MOUTH DAILY WITH OR FOLLOWING A MEAL  . Multiple Vitamin (MULTIVITAMIN WITH MINERALS) TABS tablet Take 1 tablet by mouth daily.  Marland Kitchen omeprazole  (PRILOSEC) 40 MG capsule TAKE 1 CAPSULE EVERY DAY  . Probiotic Product (PROBIOTIC DAILY PO) Take 1 tablet by mouth.  . TURMERIC PO Take by mouth daily.    No facility-administered encounter medications on file as of 01/06/2020.    Review of Systems  Constitutional: Negative for appetite change and unexpected weight change.  HENT: Negative for congestion and sinus pressure.   Eyes: Negative for pain and visual disturbance.  Respiratory: Negative for cough, chest  tightness and shortness of breath.   Cardiovascular: Negative for chest pain, palpitations and leg swelling.  Gastrointestinal: Negative for abdominal pain, diarrhea, nausea and vomiting.  Genitourinary: Negative for difficulty urinating and dysuria.  Musculoskeletal: Negative for joint swelling and myalgias.  Skin: Negative for color change and rash.  Neurological: Negative for dizziness, light-headedness and headaches.  Hematological: Negative for adenopathy. Does not bruise/bleed easily.  Psychiatric/Behavioral: Negative for agitation and dysphoric mood.       Objective:    Physical Exam Constitutional:      General: She is not in acute distress.    Appearance: Normal appearance. She is well-developed.  HENT:     Head: Normocephalic and atraumatic.     Right Ear: External ear normal.     Left Ear: External ear normal.  Eyes:     General: No scleral icterus.       Right eye: No discharge.        Left eye: No discharge.     Conjunctiva/sclera: Conjunctivae normal.  Neck:     Thyroid: No thyromegaly.  Cardiovascular:     Rate and Rhythm: Normal rate and regular rhythm.  Pulmonary:     Effort: No tachypnea, accessory muscle usage or respiratory distress.     Breath sounds: Normal breath sounds. No decreased breath sounds or wheezing.  Chest:     Breasts:        Right: No inverted nipple, mass, nipple discharge or tenderness (no axillary adenopathy).        Left: No inverted nipple, mass, nipple discharge or  tenderness (no axilarry adenopathy).  Abdominal:     General: Bowel sounds are normal.     Palpations: Abdomen is soft.     Tenderness: There is no abdominal tenderness.  Musculoskeletal:        General: No swelling or tenderness.     Cervical back: Neck supple. No tenderness.  Lymphadenopathy:     Cervical: No cervical adenopathy.  Skin:    Findings: No erythema or rash.  Neurological:     Mental Status: She is alert and oriented to person, place, and time.  Psychiatric:        Mood and Affect: Mood normal.        Behavior: Behavior normal.     BP 124/62   Pulse 62   Temp (!) 97.5 F (36.4 C)   Resp 16   Ht '5\' 4"'  (1.626 m)   Wt 140 lb (63.5 kg)   SpO2 97%   BMI 24.03 kg/m  Wt Readings from Last 3 Encounters:  01/06/20 140 lb (63.5 kg)  08/11/19 136 lb (61.7 kg)  06/03/19 132 lb (59.9 kg)     Lab Results  Component Value Date   WBC 9.2 01/04/2020   HGB 11.7 (L) 01/04/2020   HCT 34.6 (L) 01/04/2020   PLT 319.0 01/04/2020   GLUCOSE 119 (H) 01/04/2020   CHOL 140 01/04/2020   TRIG 99.0 01/04/2020   HDL 47.90 01/04/2020   LDLCALC 72 01/04/2020   ALT 21 01/04/2020   AST 18 01/04/2020   NA 134 (L) 01/04/2020   K 4.6 01/04/2020   CL 98 01/04/2020   CREATININE 0.68 01/04/2020   BUN 10 01/04/2020   CO2 29 01/04/2020   TSH 1.34 07/16/2019   HGBA1C 6.9 (H) 01/04/2020   MICROALBUR 0.7 01/04/2020       Assessment & Plan:   Problem List Items Addressed This Visit    Anemia    Follow cbc.  Relevant Orders   CBC with Differential/Platelet   IBC + Ferritin   Diabetes mellitus ([place])    Low carb diet and exercise.  a1c 6.9.  Continue metformin.  Follow met b and a1c.       Fall    No headache or dizziness.  No residual problems from the fall. Follow.        GERD (gastroesophageal reflux disease)    EGD 05/2019 - reactive gastritis.  Controlled on omeprazole.       Health care maintenance    Physical today 01/06/20.  Mammogram 12/22/18 - Birads I.   Scheduled for mammogram today.  Colonoscopy 10/2012 - normal.  Recommended f/u in 10 years.        History of breast cancer    Mammogram today.        Hypercholesterolemia    On lipitor.  Low cholesterol diet and exercise.  Follow lipid panel and liver function tests.        Hypertension    Blood pressure on recheck 118/64.  Continue losartan and metoprolol.  Follow pressures.  Follow metabolic panel.        Hypothyroidism    On thyroid replacement.  Follow tsh.        Thrombocytosis ([place])    Follow cbc.       Unsteadiness    Discussed PT - for evaluation and treatment - balance/gait.        Relevant Orders   Ambulatory referral to Physical Therapy       Einar Pheasant, MD

## 2020-01-11 ENCOUNTER — Telehealth: Payer: Self-pay | Admitting: Internal Medicine

## 2020-01-11 ENCOUNTER — Encounter: Payer: Self-pay | Admitting: Internal Medicine

## 2020-01-11 DIAGNOSIS — W19XXXA Unspecified fall, initial encounter: Secondary | ICD-10-CM | POA: Insufficient documentation

## 2020-01-11 DIAGNOSIS — R2681 Unsteadiness on feet: Secondary | ICD-10-CM | POA: Insufficient documentation

## 2020-01-11 NOTE — Telephone Encounter (Signed)
Notify pt that after review,  I would like to recheck her cbc and and iron in the next 6-8 weeks.  Also, send her hemoccult cards (ifob) if she is agreeable.

## 2020-01-11 NOTE — Assessment & Plan Note (Signed)
On lipitor.  Low cholesterol diet and exercise.  Follow lipid panel and liver function tests.   

## 2020-01-11 NOTE — Assessment & Plan Note (Signed)
Low carb diet and exercise.  a1c 6.9.  Continue metformin.  Follow met b and a1c.

## 2020-01-11 NOTE — Assessment & Plan Note (Signed)
Follow cbc.  

## 2020-01-11 NOTE — Assessment & Plan Note (Signed)
Discussed PT - for evaluation and treatment - balance/gait.

## 2020-01-11 NOTE — Assessment & Plan Note (Signed)
No headache or dizziness.  No residual problems from the fall. Follow.

## 2020-01-11 NOTE — Assessment & Plan Note (Signed)
Mammogram today.

## 2020-01-11 NOTE — Assessment & Plan Note (Signed)
On thyroid replacement.  Follow tsh.  

## 2020-01-11 NOTE — Assessment & Plan Note (Signed)
EGD 05/2019 - reactive gastritis.  Controlled on omeprazole.

## 2020-01-11 NOTE — Assessment & Plan Note (Signed)
Blood pressure on recheck 118/64.  Continue losartan and metoprolol.  Follow pressures.  Follow metabolic panel.

## 2020-01-12 ENCOUNTER — Other Ambulatory Visit: Payer: Self-pay

## 2020-01-12 DIAGNOSIS — D649 Anemia, unspecified: Secondary | ICD-10-CM

## 2020-01-12 DIAGNOSIS — D619 Aplastic anemia, unspecified: Secondary | ICD-10-CM

## 2020-01-12 NOTE — Telephone Encounter (Signed)
Patient scheduled for labs and would like to pick up ifob when she comes in for follow up labs.

## 2020-02-12 ENCOUNTER — Other Ambulatory Visit: Payer: Self-pay | Admitting: Internal Medicine

## 2020-02-29 ENCOUNTER — Ambulatory Visit: Payer: Self-pay | Attending: Internal Medicine

## 2020-02-29 DIAGNOSIS — Z23 Encounter for immunization: Secondary | ICD-10-CM

## 2020-02-29 NOTE — Progress Notes (Signed)
   Covid-19 Vaccination Clinic  Name:  Michelle Hawkins    MRN: [id number] DOB: Aug 12, 1945  02/29/2020  [name] was observed post Covid-19 immunization for 15 minutes without incident. She was provided with Vaccine Information Sheet and instruction to access the V-Safe system.   [name] was instructed to call 911 with any severe reactions post vaccine: Marland Kitchen Difficulty breathing  . Swelling of face and throat  . A fast heartbeat  . A bad rash all over body  . Dizziness and weakness

## 2020-03-01 ENCOUNTER — Other Ambulatory Visit (INDEPENDENT_AMBULATORY_CARE_PROVIDER_SITE_OTHER): Payer: PPO

## 2020-03-01 ENCOUNTER — Other Ambulatory Visit: Payer: Self-pay

## 2020-03-01 DIAGNOSIS — D649 Anemia, unspecified: Secondary | ICD-10-CM

## 2020-03-01 NOTE — Addendum Note (Signed)
Addended by: Leeanne Rio on: 03/01/2020 03:14 PM   Modules accepted: Orders

## 2020-03-01 NOTE — Addendum Note (Signed)
Addended by: Leeanne Rio on: 03/01/2020 11:56 AM   Modules accepted: Orders

## 2020-03-02 LAB — CBC WITH DIFFERENTIAL/PLATELET
Basophils Absolute: 0.1 10*3/uL (ref 0.0–0.1)
Basophils Relative: 1.1 % (ref 0.0–3.0)
Eosinophils Absolute: 0.4 10*3/uL (ref 0.0–0.7)
Eosinophils Relative: 3.4 % (ref 0.0–5.0)
HCT: 33.5 % — ABNORMAL LOW (ref 36.0–46.0)
Hemoglobin: 11.1 g/dL — ABNORMAL LOW (ref 12.0–15.0)
Lymphocytes Relative: 22.8 % (ref 12.0–46.0)
Lymphs Abs: 2.8 10*3/uL (ref 0.7–4.0)
MCHC: 33.1 g/dL (ref 30.0–36.0)
MCV: 90.2 fl (ref 78.0–100.0)
Monocytes Absolute: 1.2 10*3/uL — ABNORMAL HIGH (ref 0.1–1.0)
Monocytes Relative: 9.8 % (ref 3.0–12.0)
Neutro Abs: 7.6 10*3/uL (ref 1.4–7.7)
Neutrophils Relative %: 62.9 % (ref 43.0–77.0)
Platelets: 321 10*3/uL (ref 150.0–400.0)
RBC: 3.71 Mil/uL — ABNORMAL LOW (ref 3.87–5.11)
RDW: 13.5 % (ref 11.5–15.5)
WBC: 12.2 10*3/uL — ABNORMAL HIGH (ref 4.0–10.5)

## 2020-03-02 LAB — IBC + FERRITIN
Ferritin: 21 ng/mL (ref 10.0–291.0)
Iron: 77 ug/dL (ref 42–145)
Saturation Ratios: 16.9 % — ABNORMAL LOW (ref 20.0–50.0)
Transferrin: 325 mg/dL (ref 212.0–360.0)

## 2020-03-04 ENCOUNTER — Other Ambulatory Visit (INDEPENDENT_AMBULATORY_CARE_PROVIDER_SITE_OTHER): Payer: PPO

## 2020-03-04 DIAGNOSIS — D649 Anemia, unspecified: Secondary | ICD-10-CM | POA: Diagnosis not present

## 2020-03-04 LAB — FECAL OCCULT BLOOD, IMMUNOCHEMICAL: Fecal Occult Bld: NEGATIVE

## 2020-03-17 ENCOUNTER — Other Ambulatory Visit: Payer: Self-pay | Admitting: Internal Medicine

## 2020-03-24 DIAGNOSIS — E119 Type 2 diabetes mellitus without complications: Secondary | ICD-10-CM | POA: Diagnosis not present

## 2020-03-24 LAB — HM DIABETES EYE EXAM

## 2020-03-31 DIAGNOSIS — D509 Iron deficiency anemia, unspecified: Secondary | ICD-10-CM | POA: Diagnosis not present

## 2020-03-31 DIAGNOSIS — K5900 Constipation, unspecified: Secondary | ICD-10-CM | POA: Diagnosis not present

## 2020-03-31 DIAGNOSIS — K219 Gastro-esophageal reflux disease without esophagitis: Secondary | ICD-10-CM | POA: Diagnosis not present

## 2020-04-01 ENCOUNTER — Other Ambulatory Visit: Payer: Self-pay | Admitting: Internal Medicine

## 2020-04-22 DIAGNOSIS — D2261 Melanocytic nevi of right upper limb, including shoulder: Secondary | ICD-10-CM | POA: Diagnosis not present

## 2020-04-22 DIAGNOSIS — Z85828 Personal history of other malignant neoplasm of skin: Secondary | ICD-10-CM | POA: Diagnosis not present

## 2020-04-22 DIAGNOSIS — D2262 Melanocytic nevi of left upper limb, including shoulder: Secondary | ICD-10-CM | POA: Diagnosis not present

## 2020-04-22 DIAGNOSIS — D2271 Melanocytic nevi of right lower limb, including hip: Secondary | ICD-10-CM | POA: Diagnosis not present

## 2020-04-22 DIAGNOSIS — L570 Actinic keratosis: Secondary | ICD-10-CM | POA: Diagnosis not present

## 2020-04-22 DIAGNOSIS — X32XXXA Exposure to sunlight, initial encounter: Secondary | ICD-10-CM | POA: Diagnosis not present

## 2020-04-22 DIAGNOSIS — D2272 Melanocytic nevi of left lower limb, including hip: Secondary | ICD-10-CM | POA: Diagnosis not present

## 2020-05-06 DIAGNOSIS — L728 Other follicular cysts of the skin and subcutaneous tissue: Secondary | ICD-10-CM | POA: Diagnosis not present

## 2020-05-06 DIAGNOSIS — L720 Epidermal cyst: Secondary | ICD-10-CM | POA: Diagnosis not present

## 2020-05-06 DIAGNOSIS — R208 Other disturbances of skin sensation: Secondary | ICD-10-CM | POA: Diagnosis not present

## 2020-05-09 ENCOUNTER — Other Ambulatory Visit: Payer: PPO

## 2020-05-10 ENCOUNTER — Other Ambulatory Visit: Payer: Self-pay | Admitting: Internal Medicine

## 2020-05-11 ENCOUNTER — Ambulatory Visit: Payer: PPO | Admitting: Internal Medicine

## 2020-05-25 DIAGNOSIS — M25572 Pain in left ankle and joints of left foot: Secondary | ICD-10-CM | POA: Diagnosis not present

## 2020-05-25 DIAGNOSIS — M25561 Pain in right knee: Secondary | ICD-10-CM | POA: Diagnosis not present

## 2020-05-25 DIAGNOSIS — G8929 Other chronic pain: Secondary | ICD-10-CM | POA: Diagnosis not present

## 2020-06-13 ENCOUNTER — Other Ambulatory Visit: Payer: Self-pay | Admitting: Internal Medicine

## 2020-06-17 ENCOUNTER — Telehealth: Payer: Self-pay | Admitting: Internal Medicine

## 2020-06-17 NOTE — Telephone Encounter (Signed)
Patient called wanted to come in to [facility] having cold and hot spell she fail  On thursday

## 2020-06-20 DIAGNOSIS — I517 Cardiomegaly: Secondary | ICD-10-CM | POA: Diagnosis not present

## 2020-06-20 DIAGNOSIS — R0781 Pleurodynia: Secondary | ICD-10-CM | POA: Diagnosis not present

## 2020-06-20 DIAGNOSIS — R921 Mammographic calcification found on diagnostic imaging of breast: Secondary | ICD-10-CM | POA: Diagnosis not present

## 2020-06-20 DIAGNOSIS — J9811 Atelectasis: Secondary | ICD-10-CM | POA: Diagnosis not present

## 2020-06-20 DIAGNOSIS — S299XXA Unspecified injury of thorax, initial encounter: Secondary | ICD-10-CM | POA: Diagnosis not present

## 2020-06-20 NOTE — Telephone Encounter (Signed)
LM to call back.

## 2020-06-20 NOTE — Telephone Encounter (Signed)
Reviewed message.  Per triage, was sent to urgent care for evaluation.  Please confirm doing ok and is being seen.

## 2020-06-20 NOTE — Telephone Encounter (Signed)
Please triage this

## 2020-06-20 NOTE — Telephone Encounter (Signed)
Patient stated she has had cough and congestion last week and she fell and hit her nightstand while trying to get into bed. Patient is no l;longer having coughing and congestion sx.. She landed under her arm and ribs on end table. She stated she is having pain and wanted an xray. Informed patient that she would need to be evaluated. Due to no availability here in clinic, instructed patient to go to [facility]. She stated she will go.   Patient was very upset stating that she can never see her Doctor when she has something going on.

## 2020-06-21 NOTE — Telephone Encounter (Signed)
Confirmed patient doing ok. She stated that she needs to est care somewhere that she can be seen when she needs to be seen. Patient is going to est care at [facility].

## 2020-06-28 ENCOUNTER — Telehealth: Payer: Self-pay | Admitting: Internal Medicine

## 2020-06-28 DIAGNOSIS — E119 Type 2 diabetes mellitus without complications: Secondary | ICD-10-CM

## 2020-06-28 MED ORDER — GLUCOSE BLOOD VI STRP: ORAL_STRIP | 3 refills | Status: AC

## 2020-06-28 MED ORDER — ACCU-CHEK SOFTCLIX LANCETS MISC: 1 refills | Status: AC

## 2020-06-28 NOTE — Telephone Encounter (Signed)
Patient called in fro refill for glucose blood (ONE TOUCH ULTRA TEST) test strip ,ACCU-CHEK SOFTCLIX LANCETS lancets

## 2020-07-13 ENCOUNTER — Telehealth: Payer: Self-pay | Admitting: Internal Medicine

## 2020-07-13 NOTE — Telephone Encounter (Signed)
No longer a patient of Dr. Lorin Picket . Removed provider as PCP.

## 2020-07-13 NOTE — Telephone Encounter (Signed)
-----   Message from Bobbye Charleston sent at 07/11/2020 12:42 PM EST ----- Regarding: REMOVE PCP Happy New Year Erie Noe!  Could you please remove Dr. Lorin Picket as pt's PCP. She no longer goes to [facility] for her care  Thanks, Revonda Standard

## 2020-07-15 ENCOUNTER — Other Ambulatory Visit: Payer: PPO

## 2020-07-18 ENCOUNTER — Ambulatory Visit: Payer: PPO | Admitting: Internal Medicine

## 2020-07-21 ENCOUNTER — Other Ambulatory Visit: Payer: Self-pay | Admitting: Neurosurgery

## 2020-07-21 DIAGNOSIS — M545 Low back pain, unspecified: Secondary | ICD-10-CM

## 2020-07-21 DIAGNOSIS — G8929 Other chronic pain: Secondary | ICD-10-CM

## 2020-07-27 ENCOUNTER — Ambulatory Visit
Admission: RE | Admit: 2020-07-27 | Discharge: 2020-07-27 | Disposition: A | Discharge status: Home / Self Care | Payer: Medicare HMO | Source: Ambulatory Visit | Attending: Neurosurgery | Admitting: Neurosurgery

## 2020-07-27 ENCOUNTER — Other Ambulatory Visit: Payer: Self-pay

## 2020-07-27 DIAGNOSIS — M545 Low back pain, unspecified: Secondary | ICD-10-CM | POA: Diagnosis present

## 2020-07-27 DIAGNOSIS — G8929 Other chronic pain: Secondary | ICD-10-CM | POA: Insufficient documentation

## 2020-09-27 ENCOUNTER — Ambulatory Visit: Admit: 2020-09-27 | Payer: Medicare HMO | Admitting: Ophthalmology

## 2020-09-27 SURGERY — PHACOEMULSIFICATION, CATARACT, WITH IOL INSERTION
Anesthesia: Topical | Laterality: Left

## 2020-10-11 ENCOUNTER — Ambulatory Visit: Admit: 2020-10-11 | Payer: Medicare HMO | Admitting: Ophthalmology

## 2020-10-11 SURGERY — PHACOEMULSIFICATION, CATARACT, WITH IOL INSERTION
Anesthesia: Topical | Laterality: Right

## 2020-11-08 ENCOUNTER — Other Ambulatory Visit: Payer: Self-pay

## 2020-11-08 ENCOUNTER — Encounter: Payer: Self-pay | Admitting: Ophthalmology

## 2020-11-10 NOTE — Discharge Instructions (Signed)

## 2020-11-24 ENCOUNTER — Other Ambulatory Visit: Payer: Self-pay | Admitting: Internal Medicine

## 2020-11-24 DIAGNOSIS — Z1231 Encounter for screening mammogram for malignant neoplasm of breast: Secondary | ICD-10-CM

## 2020-11-27 NOTE — Anesthesia Preprocedure Evaluation (Addendum)
Anesthesia Evaluation  Patient identified by MRN, date of birth, ID band Patient awake    Reviewed: Allergy & Precautions, NPO status , Patient's Chart, lab work & pertinent test results  History of Anesthesia Complications Negative for: history of anesthetic complications  Airway Mallampati: IV   Neck ROM: Full    Dental no notable dental hx.    Pulmonary former smoker (quit 2005),    Pulmonary exam normal breath sounds clear to auscultation       Cardiovascular hypertension, Normal cardiovascular exam Rhythm:Regular Rate:Normal     Neuro/Psych PSYCHIATRIC DISORDERS Anxiety Depression negative neurological ROS     GI/Hepatic PUD, GERD  ,  Endo/Other  diabetes, Type 2Hypothyroidism   Renal/GU      Musculoskeletal   Abdominal   Peds  Hematology  (+) Blood dyscrasia, anemia , Breast CA   Anesthesia Other Findings   Reproductive/Obstetrics                            Anesthesia Physical Anesthesia Plan  ASA: II  Anesthesia Plan: MAC   Post-op Pain Management:    Induction: Intravenous  PONV Risk Score and Plan: 2 and TIVA, Midazolam and Treatment may vary due to age or medical condition  Airway Management Planned: Nasal Cannula  Additional Equipment:   Intra-op Plan:   Post-operative Plan:   Informed Consent: I have reviewed the patients History and Physical, chart, labs and discussed the procedure including the risks, benefits and alternatives for the proposed anesthesia with the patient or authorized representative who has indicated his/her understanding and acceptance.       Plan Discussed with: CRNA  Anesthesia Plan Comments:        Anesthesia Quick Evaluation

## 2020-11-29 ENCOUNTER — Ambulatory Visit: Payer: Medicare HMO | Admitting: Anesthesiology

## 2020-11-29 ENCOUNTER — Encounter: Payer: Self-pay | Admitting: Ophthalmology

## 2020-11-29 ENCOUNTER — Other Ambulatory Visit: Payer: Self-pay

## 2020-11-29 ENCOUNTER — Encounter
Admission: RE | Disposition: A | Discharge status: Home / Self Care | Payer: Self-pay | Source: Home / Self Care | Attending: Ophthalmology

## 2020-11-29 ENCOUNTER — Ambulatory Visit
Admission: RE | Admit: 2020-11-29 | Discharge: 2020-11-29 | Disposition: A | Discharge status: Home / Self Care | Payer: Medicare HMO | Attending: Ophthalmology | Admitting: Ophthalmology

## 2020-11-29 DIAGNOSIS — E1136 Type 2 diabetes mellitus with diabetic cataract: Secondary | ICD-10-CM | POA: Diagnosis present

## 2020-11-29 DIAGNOSIS — Z79899 Other long term (current) drug therapy: Secondary | ICD-10-CM | POA: Insufficient documentation

## 2020-11-29 DIAGNOSIS — Z7982 Long term (current) use of aspirin: Secondary | ICD-10-CM | POA: Insufficient documentation

## 2020-11-29 DIAGNOSIS — Z853 Personal history of malignant neoplasm of breast: Secondary | ICD-10-CM | POA: Insufficient documentation

## 2020-11-29 DIAGNOSIS — Z9221 Personal history of antineoplastic chemotherapy: Secondary | ICD-10-CM | POA: Insufficient documentation

## 2020-11-29 DIAGNOSIS — Z79890 Hormone replacement therapy (postmenopausal): Secondary | ICD-10-CM | POA: Diagnosis not present

## 2020-11-29 DIAGNOSIS — Z87891 Personal history of nicotine dependence: Secondary | ICD-10-CM | POA: Insufficient documentation

## 2020-11-29 DIAGNOSIS — Z923 Personal history of irradiation: Secondary | ICD-10-CM | POA: Insufficient documentation

## 2020-11-29 DIAGNOSIS — Z833 Family history of diabetes mellitus: Secondary | ICD-10-CM | POA: Diagnosis not present

## 2020-11-29 DIAGNOSIS — Z7984 Long term (current) use of oral hypoglycemic drugs: Secondary | ICD-10-CM | POA: Diagnosis not present

## 2020-11-29 DIAGNOSIS — H2512 Age-related nuclear cataract, left eye: Secondary | ICD-10-CM | POA: Insufficient documentation

## 2020-11-29 HISTORY — DX: Family history of other specified conditions: Z84.89

## 2020-11-29 HISTORY — DX: Other complications of anesthesia, initial encounter: T88.59XA

## 2020-11-29 HISTORY — PX: CATARACT EXTRACTION W/PHACO: SHX586

## 2020-11-29 LAB — GLUCOSE, CAPILLARY
Glucose-Capillary: 126 mg/dL — ABNORMAL HIGH (ref 70–99)
Glucose-Capillary: 129 mg/dL — ABNORMAL HIGH (ref 70–99)

## 2020-11-29 SURGERY — PHACOEMULSIFICATION, CATARACT, WITH IOL INSERTION
Anesthesia: Monitor Anesthesia Care | Site: Eye | Laterality: Left

## 2020-11-29 MED ORDER — BRIMONIDINE TARTRATE-TIMOLOL 0.2-0.5 % OP SOLN
OPHTHALMIC | Status: DC | PRN
  Administered 2020-11-29: 1 [drp] via OPHTHALMIC

## 2020-11-29 MED ORDER — LIDOCAINE HCL (PF) 2 % IJ SOLN
INTRAOCULAR | Status: DC | PRN
  Administered 2020-11-29: 1 mL

## 2020-11-29 MED ORDER — ARMC OPHTHALMIC DILATING DROPS: 1.0000 "application " | OPHTHALMIC | Status: DC | PRN

## 2020-11-29 MED ORDER — EPINEPHRINE PF 1 MG/ML IJ SOLN
INTRAOCULAR | Status: DC | PRN
  Administered 2020-11-29: 55 mL via OPHTHALMIC

## 2020-11-29 MED ORDER — TETRACAINE HCL 0.5 % OP SOLN
1.0000 [drp] | OPHTHALMIC | Status: DC | PRN
  Administered 2020-11-29 (×3): 1 [drp] via OPHTHALMIC

## 2020-11-29 MED ORDER — NA CHONDROIT SULF-NA HYALURON 40-17 MG/ML IO SOLN
INTRAOCULAR | Status: DC | PRN
  Administered 2020-11-29: 1 mL via INTRAOCULAR

## 2020-11-29 MED ORDER — ARMC OPHTHALMIC DILATING DROPS
1.0000 "application " | OPHTHALMIC | Status: DC | PRN
  Administered 2020-11-29 (×3): 1 via OPHTHALMIC

## 2020-11-29 MED ORDER — MIDAZOLAM HCL 2 MG/2ML IJ SOLN
INTRAMUSCULAR | Status: DC | PRN
  Administered 2020-11-29: 1 mg via INTRAVENOUS

## 2020-11-29 MED ORDER — FENTANYL CITRATE (PF) 100 MCG/2ML IJ SOLN
INTRAMUSCULAR | Status: DC | PRN
  Administered 2020-11-29: 50 ug via INTRAVENOUS

## 2020-11-29 MED ORDER — MOXIFLOXACIN HCL 0.5 % OP SOLN
OPHTHALMIC | Status: DC | PRN
  Administered 2020-11-29: 0.2 mL via OPHTHALMIC

## 2020-11-29 MED ORDER — LACTATED RINGERS IV SOLN: INTRAVENOUS | Status: DC

## 2020-11-29 MED ORDER — TETRACAINE HCL 0.5 % OP SOLN: 1.0000 [drp] | OPHTHALMIC | Status: DC | PRN

## 2020-11-29 SURGICAL SUPPLY — 20 items
CANNULA ANT/CHMB 27G (MISCELLANEOUS) ×2 IMPLANT
CANNULA ANT/CHMB 27GA (MISCELLANEOUS) ×4 IMPLANT
GLOVE BIOGEL M 8.0 STRL (GLOVE) ×1 IMPLANT
GLOVE SURG TRIUMPH 8.0 PF LTX (GLOVE) ×2 IMPLANT
GOWN STRL REUS W/ TWL LRG LVL3 (GOWN DISPOSABLE) ×2 IMPLANT
GOWN STRL REUS W/TWL LRG LVL3 (GOWN DISPOSABLE) ×4
LENS IOL ACRSF VT TRC 315 22.5 ×1 IMPLANT
LENS IOL ACRYSOF VIVITY 22.5 ×2 IMPLANT
LENS IOL VIVITY 315 22.5 ×1 IMPLANT
MARKER SKIN DUAL TIP RULER LAB (MISCELLANEOUS) ×2 IMPLANT
NDL FILTER BLUNT 18X1 1/2 (NEEDLE) ×1 IMPLANT
NEEDLE FILTER BLUNT 18X 1/2SAF (NEEDLE) ×1
NEEDLE FILTER BLUNT 18X1 1/2 (NEEDLE) ×1 IMPLANT
PACK EYE AFTER SURG (MISCELLANEOUS) ×2 IMPLANT
PACK OPTHALMIC (MISCELLANEOUS) ×2 IMPLANT
PACK PORFILIO (MISCELLANEOUS) ×2 IMPLANT
SYR 3ML LL SCALE MARK (SYRINGE) ×2 IMPLANT
SYR TB 1ML LUER SLIP (SYRINGE) ×2 IMPLANT
WATER STERILE IRR 250ML POUR (IV SOLUTION) ×2 IMPLANT
WIPE NON LINTING 3.25X3.25 (MISCELLANEOUS) ×2 IMPLANT

## 2020-11-29 NOTE — H&P (Signed)
[place]   Primary Care Physician:  Gladstone Lighter, MD Ophthalmologist: Dr. George Ina  Pre-Procedure History & Physical: HPI:  Michelle Hawkins is a 75 y.o. female here for cataract surgery.   Past Medical History:  Diagnosis Date  . Allergic rhinitis   . Anxiety   . Breast cancer ([place]) 1996   s/p left breast lumpectomy (lymph node dissection - 2/11 positive), chemotherapy  . Breast cancer ([place]) 1995   right breast lumpectomy  . Complication of anesthesia    slow to wake  . Depression   . Diabetes mellitus   . Dysphagia   . Family history of adverse reaction to anesthesia    Mother - slow to wake  . GERD (gastroesophageal reflux disease)   . H/O ulcer disease    PUD  . Hypercholesterolemia   . Hypertension   . Hypothyroidism    multinodular goiter  . Nephrolithiasis   . Personal history of chemotherapy   . Personal history of radiation therapy   . Scoliosis   . Skin cancer   . SVT (supraventricular tachycardia) (HCC)     Past Surgical History:  Procedure Laterality Date  . ABDOMINAL HYSTERECTOMY     partial  . BREAST BIOPSY Left 09/2012   benign  . BREAST LUMPECTOMY Left 1996   lumpectomy with chemo and rad tx for breast ca (lymph node dissection - 2/11 positive  . BREAST LUMPECTOMY Right 1995   lumpectomy only  . BREAST LUMPECTOMY WITH AXILLARY LYMPH NODE DISSECTION  1996   left  . COLONOSCOPY WITH PROPOFOL N/A 06/03/2019   Procedure: COLONOSCOPY WITH PROPOFOL;  Surgeon: Robert Bellow, MD;  Location: [facility] ENDOSCOPY;  Service: Endoscopy;  Laterality: N/A;  . ESOPHAGOGASTRODUODENOSCOPY N/A 06/03/2019   Procedure: ESOPHAGOGASTRODUODENOSCOPY (EGD);  Surgeon: Robert Bellow, MD;  Location: [facility] ENDOSCOPY;  Service: Endoscopy;  Laterality: N/A;  . LUMBAR LAMINECTOMY  3/07   L5-S1    Prior to Admission medications   Medication Sig Start Date End Date Taking? Authorizing Provider  aspirin 81 MG tablet Take 81 mg by mouth 2 (two) times daily.     Yes [provider]  atorvastatin (LIPITOR) 20 MG tablet TAKE ONE TABLET EVERY DAY 02/12/20  Yes Einar Pheasant, MD  Biotin 5 MG CAPS Take by mouth.   Yes [provider]  Blood Glucose Monitoring Suppl (ONE TOUCH ULTRA SYSTEM KIT) w/Device KIT 1 kit by Does not apply route once.   Yes [provider]  calcium citrate (CALCITRATE - DOSED IN MG ELEMENTAL CALCIUM) 950 MG tablet Take 200 mg of elemental calcium by mouth daily.   Yes [provider]  cholecalciferol (VITAMIN D) 400 UNITS TABS Take by mouth.   Yes [provider]  escitalopram (LEXAPRO) 20 MG tablet TAKE ONE TABLET EVERY DAY 04/01/20  Yes Einar Pheasant, MD  ferrous sulfate 325 (65 FE) MG EC tablet Take 325 mg by mouth daily with breakfast.   Yes [provider]  fish oil-omega-3 fatty acids 1000 MG capsule Take 2 g by mouth daily.   Yes [provider]  levothyroxine (SYNTHROID) 25 MCG tablet TAKE 1 TABLET EVERY DAY ON EMPTY STOMACHWITH A GLASS OF WATER AT LEAST 30-60 MINBEFORE BREAKFAST 06/13/20  Yes Einar Pheasant, MD  losartan (COZAAR) 100 MG tablet TAKE 1 TABLET BY MOUTH DAILY 05/10/20  Yes Einar Pheasant, MD  metFORMIN (GLUCOPHAGE-XR) 500 MG 24 hr tablet TAKE TWO TABLETS TWICE DAILY 06/01/19  Yes Einar Pheasant, MD  metoprolol succinate (TOPROL-XL) 50  MG 24 hr tablet TAKE 1 TABLET BY MOUTH DAILY WITH OR FOLLOWING A MEAL 06/13/20  Yes Einar Pheasant, MD  Multiple Vitamin (MULTIVITAMIN WITH MINERALS) TABS tablet Take 1 tablet by mouth daily.   Yes [provider]  omeprazole (PRILOSEC) 40 MG capsule TAKE 1 CAPSULE EVERY DAY 04/01/20  Yes Einar Pheasant, MD  Probiotic Product (PROBIOTIC DAILY PO) Take 1 tablet by mouth.   Yes [provider]  TURMERIC PO Take by mouth daily.    Yes [provider]  Accu-Chek Softclix Lancets lancets Check blood sugar twice daily Dx 250.00 06/28/20   Einar Pheasant, MD  Continuous Blood Gluc Receiver (FREESTYLE  LIBRE 14 DAY READER) DEVI Inject 1 Device into the skin every 14 (fourteen) days. Place 1 sensor every 14 days. Use to check sugar at least 4 times daily 08/17/19   Einar Pheasant, MD  Continuous Blood Gluc Sensor (FREESTYLE LIBRE 14 DAY SENSOR) MISC Inject 1 Device into the skin every 14 (fourteen) days. 08/17/19   Einar Pheasant, MD  glucose blood (ONE TOUCH ULTRA TEST) test strip CHECK BLOOD SUGAR UP TO THREE TIMES DAILY Dx E11.9 06/28/20   Einar Pheasant, MD    Allergies as of 10/11/2020  . (No Known Allergies)    Family History  Problem Relation Age of Onset  . Heart disease Father   . Hypertension Father   . Diabetes Father   . Alzheimer's disease Mother   . Diabetes Sister   . Diabetes Sister   . Diabetes Sister   . Diabetes Sister     Social History   Socioeconomic History  . Marital status: Widowed    Spouse name: Not on file  . Number of children: Not on file  . Years of education: Not on file  . Highest education level: Not on file  Occupational History  . Not on file  Tobacco Use  . Smoking status: Former Smoker    Packs/day: 0.50    Years: 40.00    Pack years: 20.00    Types: Cigarettes    Quit date: 04/14/2004    Years since quitting: 16.6  . Smokeless tobacco: Never Used  Vaping Use  . Vaping Use: Never used  Substance and Sexual Activity  . Alcohol use: No    Alcohol/week: 0.0 standard drinks  . Drug use: No  . Sexual activity: Never  Other Topics Concern  . Not on file  Social History Narrative  . Not on file   Social Determinants of Health   Financial Resource Strain: Not on file  Food Insecurity: Not on file  Transportation Needs: Not on file  Physical Activity: Not on file  Stress: Not on file  Social Connections: Not on file  Intimate Partner Violence: Not on file    Review of Systems: See HPI, otherwise negative ROS  Physical Exam: BP (!) 157/62   Pulse (!) 56   Temp (!) 97 F (36.1 C) (Temporal)   Resp 18   Ht '5\' 4"'  (1.626  m)   Wt 62.6 kg   SpO2 96%   BMI 23.69 kg/m  General:   Alert,  pleasant and cooperative in NAD Head:  Normocephalic and atraumatic. Respiratory:  Normal work of breathing. Cardiovascular:  RRR  Impression/Plan: Michelle Hawkins is here for cataract surgery.  Risks, benefits, limitations, and alternatives regarding cataract surgery have been reviewed with the patient.  Questions have been answered.  All parties agreeable.   Birder Robson, MD  11/29/2020, 7:56 AM

## 2020-11-29 NOTE — Anesthesia Procedure Notes (Signed)
Procedure Name: MAC Date/Time: 11/29/2020 8:05 AM Performed by: Cameron Ali, CRNA Pre-anesthesia Checklist: Patient identified, Emergency Drugs available, Suction available, Timeout performed and Patient being monitored Patient Re-evaluated:Patient Re-evaluated prior to induction Oxygen Delivery Method: Nasal cannula Placement Confirmation: positive ETCO2

## 2020-11-29 NOTE — Transfer of Care (Signed)
Immediate Anesthesia Transfer of Care Note  Patient: Michelle Hawkins  Procedure(s) Performed: CATARACT EXTRACTION PHACO AND INTRAOCULAR LENS PLACEMENT (IOC) LEFT DIABETIC VIVITY TORIC (Left Eye)  Patient Location: PACU  Anesthesia Type: MAC  Level of Consciousness: awake, alert  and patient cooperative  Airway and Oxygen Therapy: Patient Spontanous Breathing and Patient connected to supplemental oxygen  Post-op Assessment: Post-op Vital signs reviewed, Patient's Cardiovascular Status Stable, Respiratory Function Stable, Patent Airway and No signs of Nausea or vomiting  Post-op Vital Signs: Reviewed and stable  Complications: No complications documented.

## 2020-11-29 NOTE — Anesthesia Postprocedure Evaluation (Signed)
Anesthesia Post Note  Patient: Michelle Hawkins  Procedure(s) Performed: CATARACT EXTRACTION PHACO AND INTRAOCULAR LENS PLACEMENT (IOC) LEFT DIABETIC VIVITY TORIC (Left Eye)     Patient location during evaluation: PACU Anesthesia Type: MAC Level of consciousness: awake and alert, oriented and patient cooperative Pain management: pain level controlled Vital Signs Assessment: post-procedure vital signs reviewed and stable Respiratory status: spontaneous breathing, nonlabored ventilation and respiratory function stable Cardiovascular status: blood pressure returned to baseline and stable Postop Assessment: adequate PO intake Anesthetic complications: no   No complications documented.  Darrin Nipper

## 2020-11-29 NOTE — Op Note (Signed)
PREOPERATIVE DIAGNOSIS:  Nuclear sclerotic cataract of the left eye.   POSTOPERATIVE DIAGNOSIS:  Nuclear sclerotic cataract of the left eye.   OPERATIVE PROCEDURE: Procedure(s): CATARACT EXTRACTION PHACO AND INTRAOCULAR LENS PLACEMENT (IOC) LEFT DIABETIC VIVITY TORIC   SURGEON:  Birder Robson, MD.   ANESTHESIA: 1.      Managed anesthesia care. 2.     0.28ml os Shugarcaine was instilled following the paracentesis [email or web address]@   COMPLICATIONS:  None.   TECHNIQUE:   Stop and chop    DESCRIPTION OF PROCEDURE:  The patient was examined and consented in the preoperative holding area where the aforementioned topical anesthesia was applied to the left eye.  The patient was brought back to the Operating Room where he was sat upright on the gurney and given a target to fixate upon while the eye was marked at the 3:00 and 9:00 position.  The patient was then reclined on the operating table.  The eye was prepped and draped in the usual sterile ophthalmic fashion and a lid speculum was placed. A paracentesis was created with the side port blade and the anterior chamber was filled with viscoelastic. A near clear corneal incision was performed with the steel keratome. A continuous curvilinear capsulorrhexis was performed with a cystotome followed by the capsulorrhexis forceps. Hydrodissection and hydrodelineation were carried out with BSS on a blunt cannula. The lens was removed in a stop and chop technique and the remaining cortical material was removed with the irrigation-aspiration handpiece. The eye was inflated with viscoelastic and the DFT lens was placed in the eye and rotated to within a few degrees of the predetermined orientation.  The remaining viscoelastic was removed from the eye.  The Sinskey hook was used to rotate the toric lens into its final resting place at 162 degrees.  0.1 ml of Vigamox was placed in the anterior chamber. The eye was inflated to a physiologic pressure and found to be  watertight.  The eye was dressed with Vigamox. The patient was given protective glasses to wear throughout the day and a shield with which to sleep tonight. The patient was also given drops with which to begin a drop regimen today and will follow-up with me in one day. Implant Name Type Inv. Item Serial No. Manufacturer Lot No. LRB No. Used Action  LENS IOL ACRYSOF VIVITY 22.5 - [id number]  LENS IOL ACRYSOF VIVITY 22.5 [id number] ALCON  Left 1 Implanted   Procedure(s) with comments: CATARACT EXTRACTION PHACO AND INTRAOCULAR LENS PLACEMENT (IOC) LEFT DIABETIC VIVITY TORIC (Left) - Diabetic - oral meds 6.88 00:40.7  Electronically signed: Birder Robson 5/24/20228:24 AM

## 2020-11-30 ENCOUNTER — Encounter: Payer: Self-pay | Admitting: Ophthalmology

## 2020-12-08 NOTE — Discharge Instructions (Signed)

## 2020-12-10 NOTE — Anesthesia Preprocedure Evaluation (Addendum)
Anesthesia Evaluation  Patient identified by MRN, date of birth, ID band Patient awake    Reviewed: Allergy & Precautions, NPO status , Patient's Chart, lab work & pertinent test results  History of Anesthesia Complications Negative for: history of anesthetic complications  Airway Mallampati: IV   Neck ROM: Full    Dental no notable dental hx.    Pulmonary former smoker,    Pulmonary exam normal breath sounds clear to auscultation       Cardiovascular hypertension, Normal cardiovascular exam Rhythm:Regular Rate:Normal     Neuro/Psych PSYCHIATRIC DISORDERS Anxiety Depression negative neurological ROS     GI/Hepatic PUD, GERD  ,  Endo/Other  diabetes, Type 2Hypothyroidism   Renal/GU      Musculoskeletal   Abdominal   Peds  Hematology  (+) Blood dyscrasia, anemia , Breast CA   Anesthesia Other Findings   Reproductive/Obstetrics                             Anesthesia Physical  Anesthesia Plan  ASA: II  Anesthesia Plan: MAC   Post-op Pain Management:    Induction: Intravenous  PONV Risk Score and Plan: 2 and TIVA, Midazolam and Treatment may vary due to age or medical condition  Airway Management Planned: Nasal Cannula  Additional Equipment:   Intra-op Plan:   Post-operative Plan:   Informed Consent: I have reviewed the patients History and Physical, chart, labs and discussed the procedure including the risks, benefits and alternatives for the proposed anesthesia with the patient or authorized representative who has indicated his/her understanding and acceptance.       Plan Discussed with: CRNA  Anesthesia Plan Comments:        Anesthesia Quick Evaluation

## 2020-12-13 ENCOUNTER — Ambulatory Visit: Payer: Medicare HMO | Admitting: Anesthesiology

## 2020-12-13 ENCOUNTER — Other Ambulatory Visit: Payer: Self-pay

## 2020-12-13 ENCOUNTER — Encounter: Payer: Self-pay | Admitting: Ophthalmology

## 2020-12-13 ENCOUNTER — Ambulatory Visit
Admission: RE | Admit: 2020-12-13 | Discharge: 2020-12-13 | Disposition: A | Discharge status: Home / Self Care | Payer: Medicare HMO | Attending: Ophthalmology | Admitting: Ophthalmology

## 2020-12-13 ENCOUNTER — Encounter
Admission: RE | Disposition: A | Discharge status: Home / Self Care | Payer: Self-pay | Source: Home / Self Care | Attending: Ophthalmology

## 2020-12-13 DIAGNOSIS — Z7982 Long term (current) use of aspirin: Secondary | ICD-10-CM | POA: Diagnosis not present

## 2020-12-13 DIAGNOSIS — Z79899 Other long term (current) drug therapy: Secondary | ICD-10-CM | POA: Insufficient documentation

## 2020-12-13 DIAGNOSIS — H2511 Age-related nuclear cataract, right eye: Secondary | ICD-10-CM | POA: Diagnosis present

## 2020-12-13 DIAGNOSIS — E1136 Type 2 diabetes mellitus with diabetic cataract: Secondary | ICD-10-CM | POA: Diagnosis not present

## 2020-12-13 DIAGNOSIS — Z961 Presence of intraocular lens: Secondary | ICD-10-CM | POA: Diagnosis not present

## 2020-12-13 DIAGNOSIS — Z7984 Long term (current) use of oral hypoglycemic drugs: Secondary | ICD-10-CM | POA: Insufficient documentation

## 2020-12-13 DIAGNOSIS — Z79890 Hormone replacement therapy (postmenopausal): Secondary | ICD-10-CM | POA: Diagnosis not present

## 2020-12-13 DIAGNOSIS — Z87891 Personal history of nicotine dependence: Secondary | ICD-10-CM | POA: Insufficient documentation

## 2020-12-13 DIAGNOSIS — Z9842 Cataract extraction status, left eye: Secondary | ICD-10-CM | POA: Diagnosis not present

## 2020-12-13 HISTORY — PX: CATARACT EXTRACTION W/PHACO: SHX586

## 2020-12-13 LAB — GLUCOSE, CAPILLARY
Glucose-Capillary: 128 mg/dL — ABNORMAL HIGH (ref 70–99)
Glucose-Capillary: 143 mg/dL — ABNORMAL HIGH (ref 70–99)

## 2020-12-13 SURGERY — PHACOEMULSIFICATION, CATARACT, WITH IOL INSERTION
Anesthesia: Monitor Anesthesia Care | Site: Eye | Laterality: Right

## 2020-12-13 MED ORDER — NA CHONDROIT SULF-NA HYALURON 40-17 MG/ML IO SOLN
INTRAOCULAR | Status: DC | PRN
  Administered 2020-12-13: 1 mL via INTRAOCULAR

## 2020-12-13 MED ORDER — ARMC OPHTHALMIC DILATING DROPS
1.0000 "application " | OPHTHALMIC | Status: DC | PRN
  Administered 2020-12-13 (×3): 1 via OPHTHALMIC

## 2020-12-13 MED ORDER — ONDANSETRON HCL 4 MG/2ML IJ SOLN: 4.0000 mg | Freq: Once | INTRAMUSCULAR | Status: DC | PRN

## 2020-12-13 MED ORDER — MOXIFLOXACIN HCL 0.5 % OP SOLN
OPHTHALMIC | Status: DC | PRN
  Administered 2020-12-13: 0.2 mL via OPHTHALMIC

## 2020-12-13 MED ORDER — BRIMONIDINE TARTRATE-TIMOLOL 0.2-0.5 % OP SOLN
OPHTHALMIC | Status: DC | PRN
  Administered 2020-12-13: 1 [drp] via OPHTHALMIC

## 2020-12-13 MED ORDER — MIDAZOLAM HCL 2 MG/2ML IJ SOLN
INTRAMUSCULAR | Status: DC | PRN
  Administered 2020-12-13: 1 mg via INTRAVENOUS

## 2020-12-13 MED ORDER — ACETAMINOPHEN 325 MG PO TABS: 650.0000 mg | ORAL_TABLET | Freq: Once | ORAL | Status: DC | PRN

## 2020-12-13 MED ORDER — TETRACAINE HCL 0.5 % OP SOLN
1.0000 [drp] | OPHTHALMIC | Status: DC | PRN
  Administered 2020-12-13 (×3): 1 [drp] via OPHTHALMIC

## 2020-12-13 MED ORDER — LIDOCAINE HCL (PF) 2 % IJ SOLN
INTRAOCULAR | Status: DC | PRN
  Administered 2020-12-13: 1 mL

## 2020-12-13 MED ORDER — ACETAMINOPHEN 160 MG/5ML PO SOLN: 325.0000 mg | ORAL | Status: DC | PRN

## 2020-12-13 MED ORDER — LACTATED RINGERS IV SOLN: INTRAVENOUS | Status: DC

## 2020-12-13 MED ORDER — FENTANYL CITRATE (PF) 100 MCG/2ML IJ SOLN
INTRAMUSCULAR | Status: DC | PRN
  Administered 2020-12-13: 50 ug via INTRAVENOUS

## 2020-12-13 MED ORDER — EPINEPHRINE PF 1 MG/ML IJ SOLN
INTRAOCULAR | Status: DC | PRN
  Administered 2020-12-13: 50 mL via OPHTHALMIC

## 2020-12-13 SURGICAL SUPPLY — 19 items
CANNULA ANT/CHMB 27G (MISCELLANEOUS) ×2 IMPLANT
CANNULA ANT/CHMB 27GA (MISCELLANEOUS) ×6 IMPLANT
GLOVE SURG TRIUMPH 8.0 PF LTX (GLOVE) ×3 IMPLANT
GOWN STRL REUS W/ TWL LRG LVL3 (GOWN DISPOSABLE) ×2 IMPLANT
GOWN STRL REUS W/TWL LRG LVL3 (GOWN DISPOSABLE) ×6
LENS IOL ACRSF VT TRC 315 22.0 IMPLANT
LENS IOL ACRYSOF VIVITY 22.0 ×3 IMPLANT
LENS IOL VIVITY 315 22.0 ×1 IMPLANT
MARKER SKIN DUAL TIP RULER LAB (MISCELLANEOUS) ×3 IMPLANT
NDL FILTER BLUNT 18X1 1/2 (NEEDLE) ×1 IMPLANT
NEEDLE FILTER BLUNT 18X 1/2SAF (NEEDLE) ×2
NEEDLE FILTER BLUNT 18X1 1/2 (NEEDLE) ×1 IMPLANT
PACK EYE AFTER SURG (MISCELLANEOUS) ×3 IMPLANT
PACK OPTHALMIC (MISCELLANEOUS) ×3 IMPLANT
PACK PORFILIO (MISCELLANEOUS) ×3 IMPLANT
SYR 3ML LL SCALE MARK (SYRINGE) ×3 IMPLANT
SYR TB 1ML LUER SLIP (SYRINGE) ×3 IMPLANT
WATER STERILE IRR 250ML POUR (IV SOLUTION) ×3 IMPLANT
WIPE NON LINTING 3.25X3.25 (MISCELLANEOUS) ×3 IMPLANT

## 2020-12-13 NOTE — H&P (Signed)
[place]   Primary Care Physician:  Gladstone Lighter, MD Ophthalmologist: Dr. George Ina  Pre-Procedure History & Physical: HPI:  Michelle Hawkins is a 75 y.o. female here for cataract surgery.   Past Medical History:  Diagnosis Date  . Allergic rhinitis   . Anxiety   . Breast cancer ([place]) 1996   s/p left breast lumpectomy (lymph node dissection - 2/11 positive), chemotherapy  . Breast cancer ([place]) 1995   right breast lumpectomy  . Complication of anesthesia    slow to wake  . Depression   . Diabetes mellitus   . Dysphagia   . Family history of adverse reaction to anesthesia    Mother - slow to wake  . GERD (gastroesophageal reflux disease)   . H/O ulcer disease    PUD  . Hypercholesterolemia   . Hypertension   . Hypothyroidism    multinodular goiter  . Nephrolithiasis   . Personal history of chemotherapy   . Personal history of radiation therapy   . Scoliosis   . Skin cancer   . SVT (supraventricular tachycardia) (HCC)     Past Surgical History:  Procedure Laterality Date  . ABDOMINAL HYSTERECTOMY     partial  . BREAST BIOPSY Left 09/2012   benign  . BREAST LUMPECTOMY Left 1996   lumpectomy with chemo and rad tx for breast ca (lymph node dissection - 2/11 positive  . BREAST LUMPECTOMY Right 1995   lumpectomy only  . BREAST LUMPECTOMY WITH AXILLARY LYMPH NODE DISSECTION  1996   left  . CATARACT EXTRACTION W/PHACO Left 11/29/2020   Procedure: CATARACT EXTRACTION PHACO AND INTRAOCULAR LENS PLACEMENT ([place]) LEFT DIABETIC VIVITY TORIC;  Surgeon: Birder Robson, MD;  Location: [place];  Service: Ophthalmology;  Laterality: Left;  Diabetic - oral meds 6.88 00:40.7  . COLONOSCOPY WITH PROPOFOL N/A 06/03/2019   Procedure: COLONOSCOPY WITH PROPOFOL;  Surgeon: Robert Bellow, MD;  Location: [facility] ENDOSCOPY;  Service: Endoscopy;  Laterality: N/A;  . ESOPHAGOGASTRODUODENOSCOPY N/A 06/03/2019   Procedure: ESOPHAGOGASTRODUODENOSCOPY (EGD);  Surgeon:  Robert Bellow, MD;  Location: [facility] ENDOSCOPY;  Service: Endoscopy;  Laterality: N/A;  . LUMBAR LAMINECTOMY  3/07   L5-S1    Prior to Admission medications   Medication Sig Start Date End Date Taking? Authorizing Provider  aspirin 81 MG tablet Take 81 mg by mouth 2 (two) times daily.    Yes [provider]  atorvastatin (LIPITOR) 20 MG tablet TAKE ONE TABLET EVERY DAY 02/12/20  Yes Einar Pheasant, MD  Biotin 5 MG CAPS Take by mouth.   Yes [provider]  calcium citrate (CALCITRATE - DOSED IN MG ELEMENTAL CALCIUM) 950 MG tablet Take 200 mg of elemental calcium by mouth daily.   Yes [provider]  cholecalciferol (VITAMIN D) 400 UNITS TABS Take by mouth.   Yes [provider]  escitalopram (LEXAPRO) 20 MG tablet TAKE ONE TABLET EVERY DAY 04/01/20  Yes Einar Pheasant, MD  ferrous sulfate 325 (65 FE) MG EC tablet Take 325 mg by mouth daily with breakfast.   Yes [provider]  fish oil-omega-3 fatty acids 1000 MG capsule Take 2 g by mouth daily.   Yes [provider]  levothyroxine (SYNTHROID) 25 MCG tablet TAKE 1 TABLET EVERY DAY ON EMPTY STOMACHWITH A GLASS OF WATER AT LEAST 30-60 MINBEFORE BREAKFAST 06/13/20  Yes Einar Pheasant, MD  losartan (COZAAR) 100 MG tablet TAKE 1 TABLET BY MOUTH DAILY 05/10/20  Yes Einar Pheasant, MD  metFORMIN (GLUCOPHAGE-XR) 500 MG  24 hr tablet TAKE TWO TABLETS TWICE DAILY 06/01/19  Yes Einar Pheasant, MD  metoprolol succinate (TOPROL-XL) 50 MG 24 hr tablet TAKE 1 TABLET BY MOUTH DAILY WITH OR FOLLOWING A MEAL 06/13/20  Yes Einar Pheasant, MD  Multiple Vitamin (MULTIVITAMIN WITH MINERALS) TABS tablet Take 1 tablet by mouth daily.   Yes [provider]  omeprazole (PRILOSEC) 40 MG capsule TAKE 1 CAPSULE EVERY DAY 04/01/20  Yes Einar Pheasant, MD  Probiotic Product (PROBIOTIC DAILY PO) Take 1 tablet by mouth.   Yes [provider]  TURMERIC PO Take by mouth daily.    Yes [provider]  Accu-Chek Softclix Lancets lancets Check blood sugar twice daily Dx 250.00 06/28/20   Einar Pheasant, MD  Blood Glucose Monitoring Suppl (ONE TOUCH ULTRA SYSTEM KIT) w/Device KIT 1 kit by Does not apply route once.    [provider]  Continuous Blood Gluc Receiver (FREESTYLE LIBRE 14 DAY READER) DEVI Inject 1 Device into the skin every 14 (fourteen) days. Place 1 sensor every 14 days. Use to check sugar at least 4 times daily 08/17/19   Einar Pheasant, MD  Continuous Blood Gluc Sensor (FREESTYLE LIBRE 14 DAY SENSOR) MISC Inject 1 Device into the skin every 14 (fourteen) days. 08/17/19   Einar Pheasant, MD  glucose blood (ONE TOUCH ULTRA TEST) test strip CHECK BLOOD SUGAR UP TO THREE TIMES DAILY Dx E11.9 06/28/20   Einar Pheasant, MD    Allergies as of 10/11/2020  . (No Known Allergies)    Family History  Problem Relation Age of Onset  . Heart disease Father   . Hypertension Father   . Diabetes Father   . Alzheimer's disease Mother   . Diabetes Sister   . Diabetes Sister   . Diabetes Sister   . Diabetes Sister     Social History   Socioeconomic History  . Marital status: Widowed    Spouse name: Not on file  . Number of children: Not on file  . Years of education: Not on file  . Highest education level: Not on file  Occupational History  . Not on file  Tobacco Use  . Smoking status: Former Smoker    Packs/day: 0.50    Years: 40.00    Pack years: 20.00    Types: Cigarettes    Quit date: 04/14/2004    Years since quitting: 16.6  . Smokeless tobacco: Never Used  Vaping Use  . Vaping Use: Never used  Substance and Sexual Activity  . Alcohol use: No    Alcohol/week: 0.0 standard drinks  . Drug use: No  . Sexual activity: Never  Other Topics Concern  . Not on file  Social History Narrative  . Not on file   Social Determinants of Health   Financial Resource Strain: Not on file  Food Insecurity: Not on file  Transportation Needs: Not on file   Physical Activity: Not on file  Stress: Not on file  Social Connections: Not on file  Intimate Partner Violence: Not on file    Review of Systems: See HPI, otherwise negative ROS  Physical Exam: BP (!) 139/53   Pulse (!) 56   Temp (!) 96.4 F (35.8 C) (Temporal)   Resp 16   Ht _0  (1.626 m)   Wt 63 kg   SpO2 97%   BMI 23.86 kg/m  General:   Alert,  pleasant and cooperative in NAD Head:  Normocephalic and atraumatic. Respiratory:  Normal work of breathing. Cardiovascular:  RRR  Impression/Plan: Michelle Hawkins is here for cataract surgery.  Risks, benefits, limitations, and alternatives regarding cataract surgery have been reviewed with the patient.  Questions have been answered.  All parties agreeable.   Birder Robson, MD  12/13/2020, 7:17 AM

## 2020-12-13 NOTE — Anesthesia Postprocedure Evaluation (Signed)
Anesthesia Post Note  Patient: BLAYKLEE MABLE  Procedure(s) Performed: CATARACT EXTRACTION PHACO AND INTRAOCULAR LENS PLACEMENT (IOC) RIGHT DIABETIC VIVITY TORIC 5.44 00:40.3 (Right Eye)     Patient location during evaluation: PACU Anesthesia Type: MAC Level of consciousness: awake and alert, oriented and patient cooperative Pain management: pain level controlled Vital Signs Assessment: post-procedure vital signs reviewed and stable Respiratory status: spontaneous breathing, nonlabored ventilation and respiratory function stable Cardiovascular status: blood pressure returned to baseline and stable Postop Assessment: adequate PO intake Anesthetic complications: no   No complications documented.  Darrin Nipper

## 2020-12-13 NOTE — Op Note (Signed)
PREOPERATIVE DIAGNOSIS:  Nuclear sclerotic cataract of the right eye.   POSTOPERATIVE DIAGNOSIS:  Nuclear sclerotic cataract of the right eye.   OPERATIVE PROCEDURE: Procedure(s): CATARACT EXTRACTION PHACO AND INTRAOCULAR LENS PLACEMENT (IOC) RIGHT DIABETIC VIVITY TORIC 5.44 00:40.3   SURGEON:  Birder Robson, MD.   ANESTHESIA: 1.      Managed anesthesia care. 2.     0.16ml of Shugarcaine was instilled following the paracentesis  Anesthesiologist: Darrin Nipper, MD CRNA: Dionne Bucy, CRNA  COMPLICATIONS:  None.   TECHNIQUE:   Stop and chop    DESCRIPTION OF PROCEDURE:  The patient was examined and consented in the preoperative holding area where the aforementioned topical anesthesia was applied to the right eye.  The patient was brought back to the Operating Room where he was sat upright on the gurney and given a target to fixate upon while the eye was marked at the 3:00 and 9:00 position.  The patient was then reclined on the operating table.  The eye was prepped and draped in the usual sterile ophthalmic fashion and a lid speculum was placed. A paracentesis was created with the side port blade and the anterior chamber was filled with viscoelastic. A near clear corneal incision was performed with the steel keratome. A continuous curvilinear capsulorrhexis was performed with a cystotome followed by the capsulorrhexis forceps. Hydrodissection and hydrodelineation were carried out with BSS on a blunt cannula. The lens was removed in a stop and chop technique and the remaining cortical material was removed with the irrigation-aspiration handpiece. The eye was inflated with viscoelastic and the [id number]  lens  was placed in the eye and rotated to within a few degrees of the predetermined orientation.  The remaining viscoelastic was removed from the eye.  The Sinskey hook was used to rotate the toric lens into its final resting place at 007 degrees.  0. The eye was inflated to a physiologic  pressure and found to be watertight. 0.24ml of Vigamox was placed in the anterior chamber.  The eye was dressed with Vigamox. The patient was given protective glasses to wear throughout the day and a shield with which to sleep tonight. The patient was also given drops with which to begin a drop regimen today and will follow-up with me in one day. Implant Name Type Inv. Item Serial No. Manufacturer Lot No. LRB No. Used Action  LENS IOL ACRYSOF VIVITY 22.0 - [id number]  LENS IOL ACRYSOF VIVITY 22.0 [id number] [employer]CON  Right 1 Implanted   Procedure(s) with comments: CATARACT EXTRACTION PHACO AND INTRAOCULAR LENS PLACEMENT (IOC) RIGHT DIABETIC VIVITY TORIC 5.44 00:40.3 (Right) - Diabetic - oral meds  Electronically signed: Birder Robson 12/13/2020 7:56 AM

## 2020-12-13 NOTE — Transfer of Care (Signed)
Immediate Anesthesia Transfer of Care Note  Patient: Michelle Hawkins  Procedure(s) Performed: CATARACT EXTRACTION PHACO AND INTRAOCULAR LENS PLACEMENT (IOC) RIGHT DIABETIC VIVITY TORIC 5.44 00:40.3 (Right Eye)  Patient Location: PACU  Anesthesia Type: MAC  Level of Consciousness: awake, alert  and patient cooperative  Airway and Oxygen Therapy: Patient Spontanous Breathing and Patient connected to supplemental oxygen  Post-op Assessment: Post-op Vital signs reviewed, Patient's Cardiovascular Status Stable, Respiratory Function Stable, Patent Airway and No signs of Nausea or vomiting  Post-op Vital Signs: Reviewed and stable  Complications: No complications documented.

## 2020-12-13 NOTE — Anesthesia Procedure Notes (Signed)
Procedure Name: MAC Date/Time: 12/13/2020 7:30 AM Performed by: Dionne Bucy, CRNA Pre-anesthesia Checklist: Patient identified, Emergency Drugs available, Suction available, Patient being monitored and Timeout performed Patient Re-evaluated:Patient Re-evaluated prior to induction Oxygen Delivery Method: Nasal cannula Placement Confirmation: positive ETCO2

## 2020-12-14 ENCOUNTER — Encounter: Payer: Self-pay | Admitting: Ophthalmology

## 2021-01-17 ENCOUNTER — Ambulatory Visit
Admission: RE | Admit: 2021-01-17 | Discharge: 2021-01-17 | Disposition: A | Discharge status: Home / Self Care | Payer: Medicare HMO | Source: Ambulatory Visit | Attending: Internal Medicine | Admitting: Internal Medicine

## 2021-01-17 ENCOUNTER — Other Ambulatory Visit: Payer: Self-pay

## 2021-01-17 DIAGNOSIS — Z1231 Encounter for screening mammogram for malignant neoplasm of breast: Secondary | ICD-10-CM

## 2021-07-24 IMAGING — MG DIGITAL SCREENING BILAT W/ TOMO W/ CAD
8 series · 8 of 24 positions shown · non-contrast
Comparison: Previous exam(s).

CLINICAL DATA: Screening.

EXAM:
DIGITAL SCREENING BILATERAL MAMMOGRAM WITH TOMO AND CAD

[L MLO synth-2D]
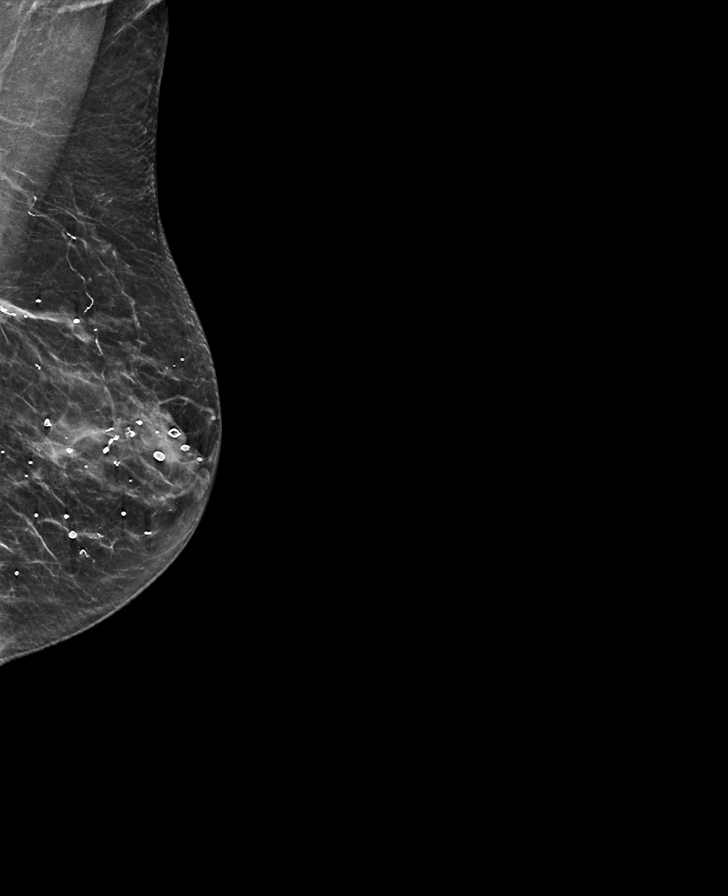

[R MLO synth-2D]
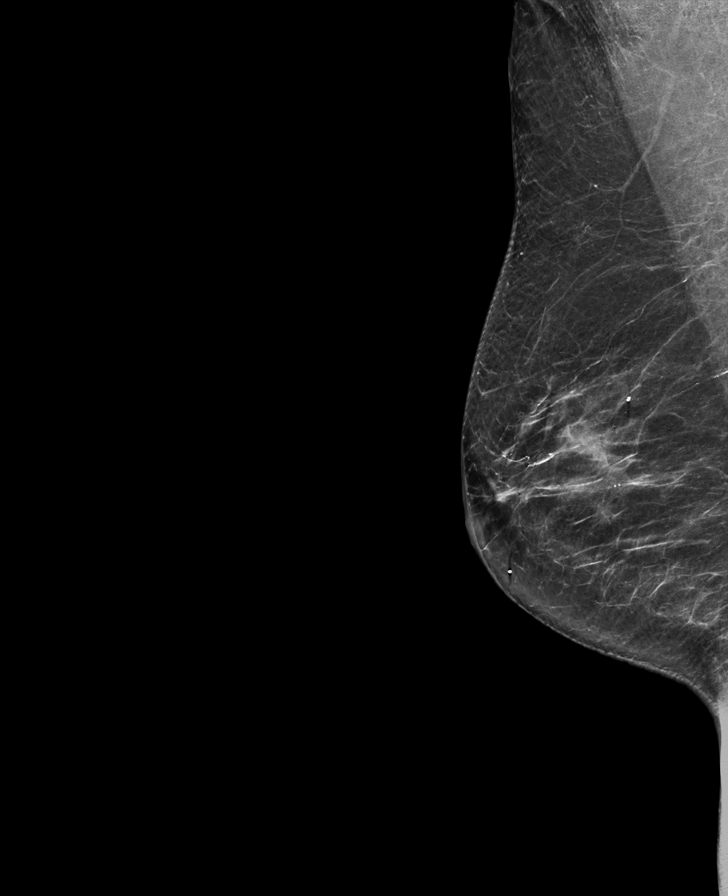

[R CC synth-2D]
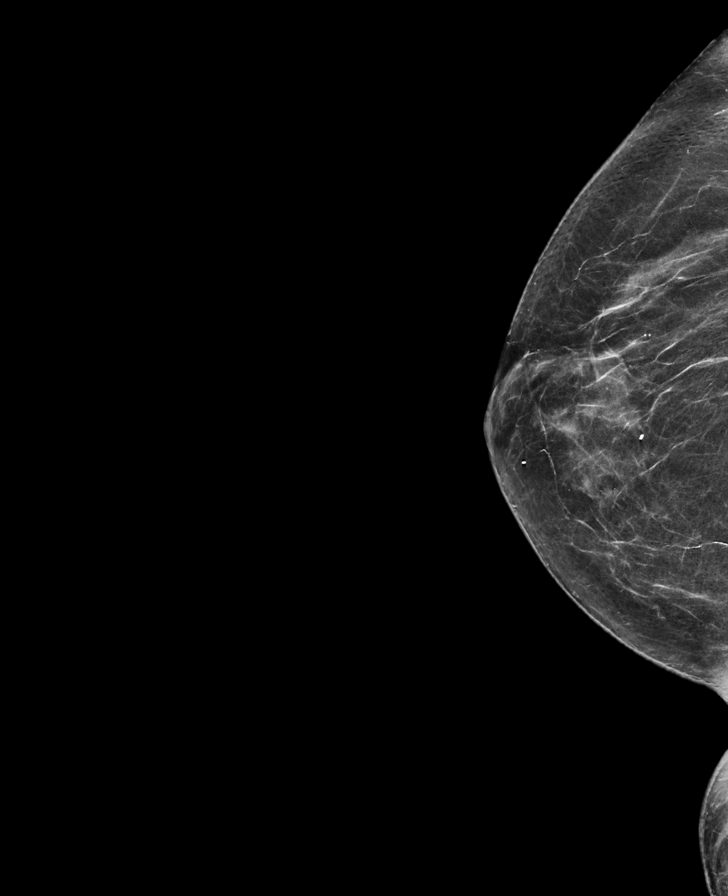

[L CC synth-2D]
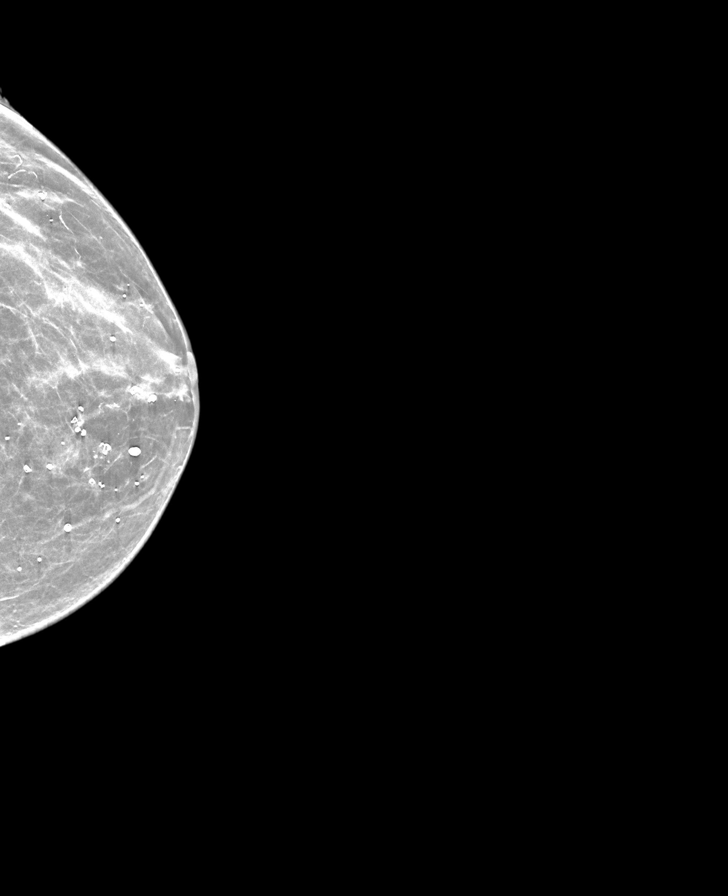

[L CC tomo · tomo slice 24/47.0]
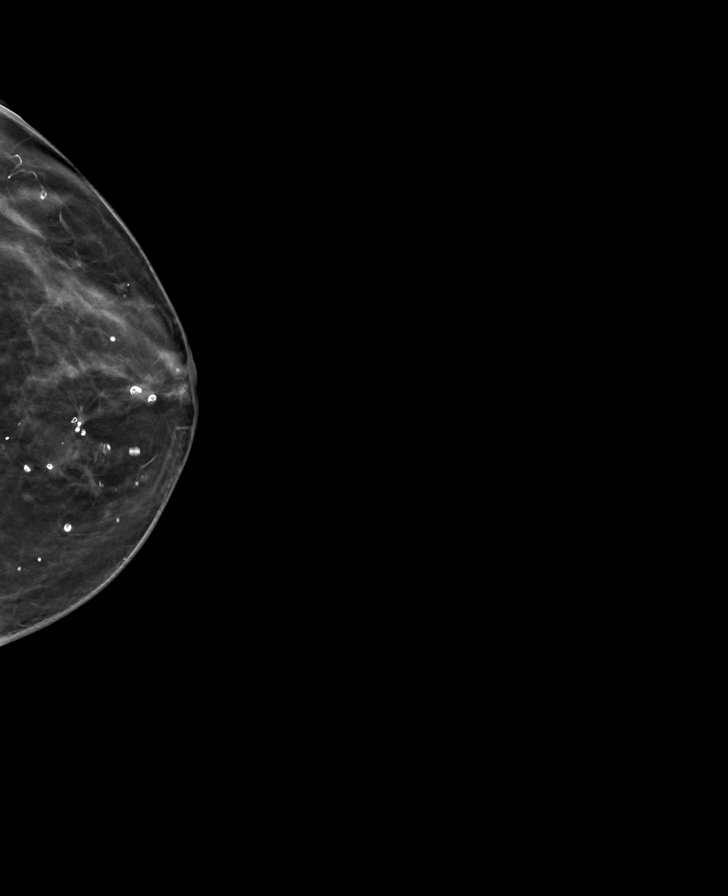

[R CC tomo · tomo slice 31/62.0]
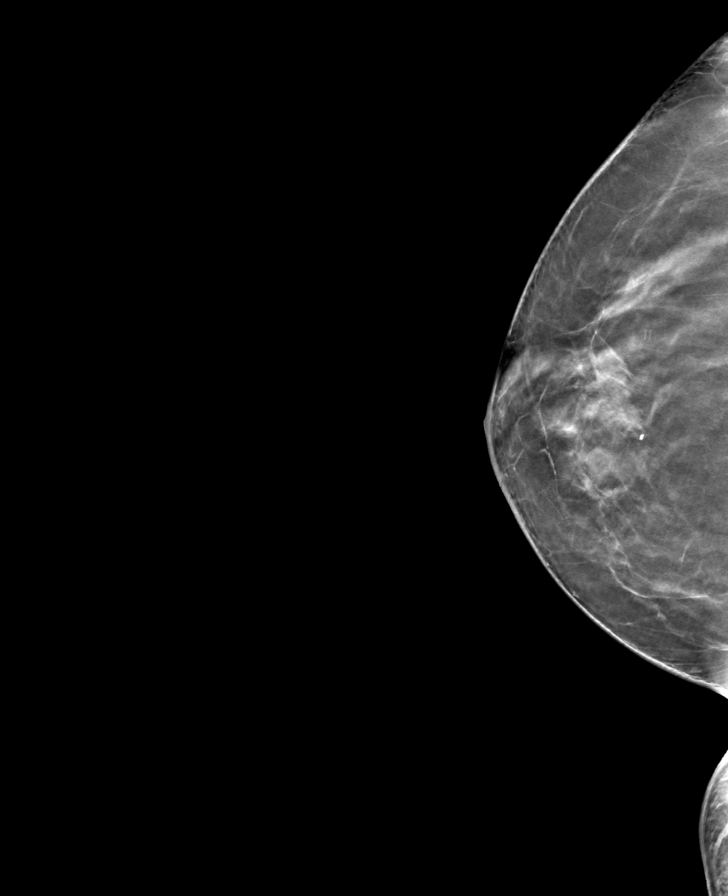

[L MLO tomo · tomo slice 30/59.0]
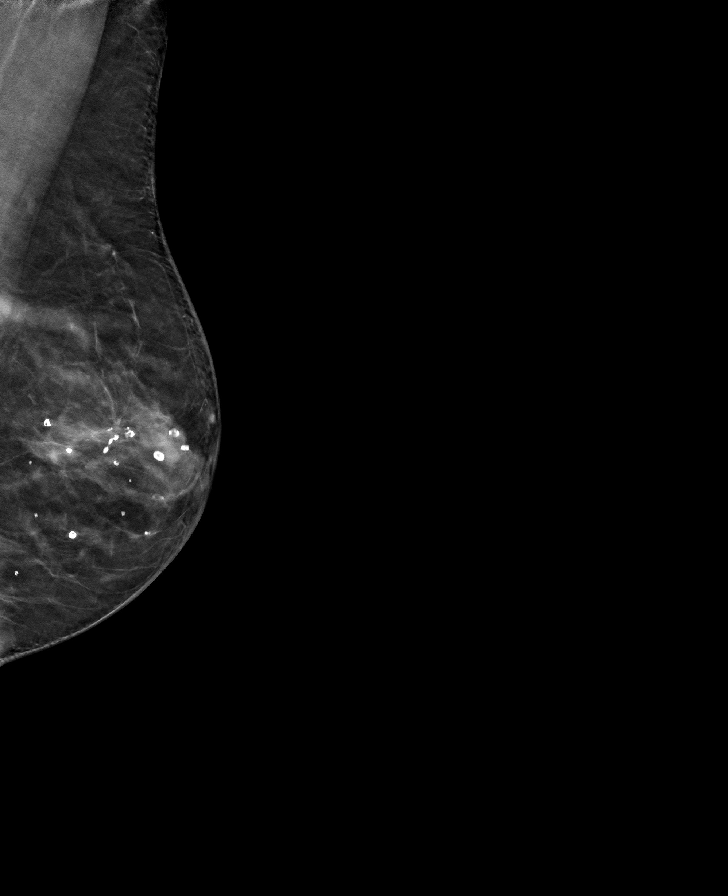

[R MLO tomo · tomo slice 34/67.0]
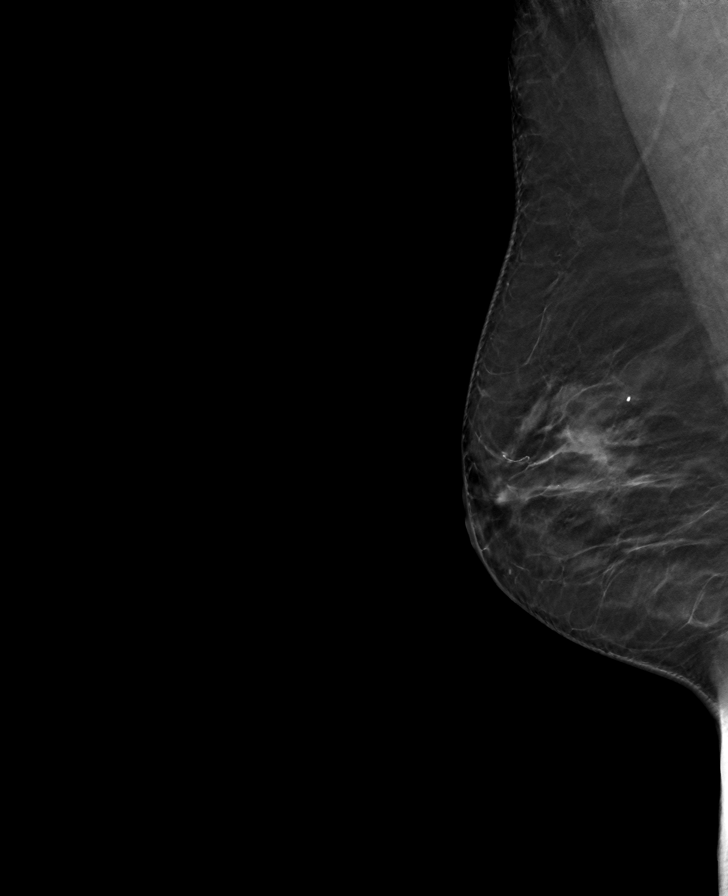

[8 of 24 positions shown; findings below may reference images not displayed]

ACR Breast Density Category c: The breast tissue is heterogeneously
dense, which may obscure small masses.
FINDINGS: There are no findings suspicious for malignancy. Images were
processed with CAD.
IMPRESSION: No mammographic evidence of malignancy. A result letter of this
screening mammogram will be mailed directly to the patient.

RECOMMENDATION:
Screening mammogram in one year. (Code:[id number])

BI-RADS CATEGORY  1: Negative.

## 2021-10-09 ENCOUNTER — Other Ambulatory Visit: Payer: Self-pay | Admitting: Internal Medicine

## 2021-10-09 DIAGNOSIS — E119 Type 2 diabetes mellitus without complications: Secondary | ICD-10-CM

## 2021-12-19 ENCOUNTER — Other Ambulatory Visit: Payer: Self-pay | Admitting: Internal Medicine

## 2021-12-19 DIAGNOSIS — Z1231 Encounter for screening mammogram for malignant neoplasm of breast: Secondary | ICD-10-CM

## 2022-01-22 ENCOUNTER — Ambulatory Visit
Admission: RE | Admit: 2022-01-22 | Discharge: 2022-01-22 | Disposition: A | Discharge status: Home / Self Care | Payer: Medicare HMO | Source: Ambulatory Visit | Attending: Internal Medicine | Admitting: Internal Medicine

## 2022-01-22 DIAGNOSIS — Z1231 Encounter for screening mammogram for malignant neoplasm of breast: Secondary | ICD-10-CM | POA: Insufficient documentation

## 2023-01-01 ENCOUNTER — Other Ambulatory Visit: Payer: Self-pay

## 2023-01-01 DIAGNOSIS — Z1231 Encounter for screening mammogram for malignant neoplasm of breast: Secondary | ICD-10-CM

## 2023-01-30 ENCOUNTER — Ambulatory Visit
Admission: RE | Admit: 2023-01-30 | Discharge: 2023-01-30 | Disposition: A | Discharge status: Home / Self Care | Payer: Medicare HMO | Source: Ambulatory Visit | Attending: Internal Medicine | Admitting: Internal Medicine

## 2023-01-30 DIAGNOSIS — Z1231 Encounter for screening mammogram for malignant neoplasm of breast: Secondary | ICD-10-CM | POA: Diagnosis not present

## 2023-03-15 ENCOUNTER — Other Ambulatory Visit: Payer: Self-pay | Admitting: Nephrology

## 2023-03-15 DIAGNOSIS — E871 Hypo-osmolality and hyponatremia: Secondary | ICD-10-CM

## 2023-04-19 ENCOUNTER — Ambulatory Visit
Admission: RE | Admit: 2023-04-19 | Discharge: 2023-04-19 | Disposition: A | Discharge status: Home / Self Care | Payer: Medicare HMO | Attending: Nephrology | Admitting: Nephrology

## 2023-04-19 ENCOUNTER — Ambulatory Visit
Admission: RE | Admit: 2023-04-19 | Discharge: 2023-04-19 | Disposition: A | Discharge status: Home / Self Care | Payer: Medicare HMO | Source: Ambulatory Visit | Attending: Nephrology | Admitting: Nephrology

## 2023-04-19 DIAGNOSIS — E871 Hypo-osmolality and hyponatremia: Secondary | ICD-10-CM | POA: Diagnosis present

## 2023-10-26 ENCOUNTER — Other Ambulatory Visit: Payer: Self-pay

## 2023-10-26 ENCOUNTER — Observation Stay

## 2023-10-26 ENCOUNTER — Observation Stay
Admission: EM | Admit: 2023-10-26 | Discharge: 2023-10-29 | Disposition: A | Discharge status: Home / Self Care | Attending: Internal Medicine | Admitting: Internal Medicine

## 2023-10-26 ENCOUNTER — Emergency Department

## 2023-10-26 DIAGNOSIS — E871 Hypo-osmolality and hyponatremia: Secondary | ICD-10-CM | POA: Insufficient documentation

## 2023-10-26 DIAGNOSIS — Z7984 Long term (current) use of oral hypoglycemic drugs: Secondary | ICD-10-CM | POA: Diagnosis not present

## 2023-10-26 DIAGNOSIS — E1165 Type 2 diabetes mellitus with hyperglycemia: Secondary | ICD-10-CM

## 2023-10-26 DIAGNOSIS — Z1152 Encounter for screening for COVID-19: Secondary | ICD-10-CM | POA: Insufficient documentation

## 2023-10-26 DIAGNOSIS — R531 Weakness: Secondary | ICD-10-CM | POA: Diagnosis not present

## 2023-10-26 DIAGNOSIS — E119 Type 2 diabetes mellitus without complications: Secondary | ICD-10-CM | POA: Insufficient documentation

## 2023-10-26 DIAGNOSIS — Z7901 Long term (current) use of anticoagulants: Secondary | ICD-10-CM | POA: Diagnosis not present

## 2023-10-26 DIAGNOSIS — J3489 Other specified disorders of nose and nasal sinuses: Secondary | ICD-10-CM | POA: Diagnosis not present

## 2023-10-26 DIAGNOSIS — D649 Anemia, unspecified: Secondary | ICD-10-CM | POA: Insufficient documentation

## 2023-10-26 DIAGNOSIS — I272 Pulmonary hypertension, unspecified: Secondary | ICD-10-CM | POA: Insufficient documentation

## 2023-10-26 DIAGNOSIS — E785 Hyperlipidemia, unspecified: Secondary | ICD-10-CM | POA: Diagnosis not present

## 2023-10-26 DIAGNOSIS — R0902 Hypoxemia: Principal | ICD-10-CM

## 2023-10-26 DIAGNOSIS — E039 Hypothyroidism, unspecified: Secondary | ICD-10-CM | POA: Diagnosis not present

## 2023-10-26 DIAGNOSIS — Z87891 Personal history of nicotine dependence: Secondary | ICD-10-CM | POA: Insufficient documentation

## 2023-10-26 DIAGNOSIS — R0602 Shortness of breath: Secondary | ICD-10-CM | POA: Diagnosis present

## 2023-10-26 DIAGNOSIS — R079 Chest pain, unspecified: Secondary | ICD-10-CM | POA: Diagnosis not present

## 2023-10-26 DIAGNOSIS — Z79899 Other long term (current) drug therapy: Secondary | ICD-10-CM | POA: Insufficient documentation

## 2023-10-26 LAB — BASIC METABOLIC PANEL WITH GFR
Anion gap: 8 (ref 5–15)
BUN: 21 mg/dL (ref 8–23)
CO2: 21 mmol/L — ABNORMAL LOW (ref 22–32)
Calcium: 9 mg/dL (ref 8.9–10.3)
Chloride: 97 mmol/L — ABNORMAL LOW (ref 98–111)
Creatinine, Ser: 0.82 mg/dL (ref 0.44–1.00)
GFR, Estimated: >60 mL/min (ref >60)
Glucose, Bld: 166 mg/dL — ABNORMAL HIGH (ref 70–99)
Potassium: 4 mmol/L (ref 3.5–5.1)
Sodium: 126 mmol/L — ABNORMAL LOW (ref 135–145)

## 2023-10-26 LAB — CBC
HCT: 31.1 % — ABNORMAL LOW (ref 36.0–46.0)
HCT: 30 % — ABNORMAL LOW (ref 36.0–46.0)
Hemoglobin: 10.4 g/dL — ABNORMAL LOW (ref 12.0–15.0)
Hemoglobin: 9.7 g/dL — ABNORMAL LOW (ref 12.0–15.0)
MCH: 28.3 pg (ref 26.0–34.0)
MCH: 27.6 pg (ref 26.0–34.0)
MCHC: 33.4 g/dL (ref 30.0–36.0)
MCHC: 32.3 g/dL (ref 30.0–36.0)
MCV: 84.5 fL (ref 80.0–100.0)
MCV: 85.2 fL (ref 80.0–100.0)
Platelets: 264 10*3/uL (ref 150–400)
Platelets: 316 10*3/uL (ref 150–400)
RBC: 3.68 MIL/uL — ABNORMAL LOW (ref 3.87–5.11)
RBC: 3.52 MIL/uL — ABNORMAL LOW (ref 3.87–5.11)
RDW: 14.2 % (ref 11.5–15.5)
RDW: 14.2 % (ref 11.5–15.5)
WBC: 14.8 10*3/uL — ABNORMAL HIGH (ref 4.0–10.5)
WBC: 13.7 10*3/uL — ABNORMAL HIGH (ref 4.0–10.5)
nRBC: 0 % (ref 0.0–0.2)
nRBC: 0 % (ref 0.0–0.2)

## 2023-10-26 LAB — CREATININE, SERUM
Creatinine, Ser: 0.83 mg/dL (ref 0.44–1.00)
GFR, Estimated: >60 mL/min (ref >60)

## 2023-10-26 LAB — GLUCOSE, CAPILLARY: Glucose-Capillary: 159 mg/dL — ABNORMAL HIGH (ref 70–99)

## 2023-10-26 LAB — HEPATIC FUNCTION PANEL
ALT: 21 U/L (ref 0–44)
AST: 28 U/L (ref 15–41)
Albumin: 3.4 g/dL — ABNORMAL LOW (ref 3.5–5.0)
Alkaline Phosphatase: 61 U/L (ref 38–126)
Bilirubin, Direct: 0.1 mg/dL (ref 0.0–0.2)
Indirect Bilirubin: 0.8 mg/dL (ref 0.3–0.9)
Total Bilirubin: 0.9 mg/dL (ref 0.0–1.2)
Total Protein: 7.1 g/dL (ref 6.5–8.1)

## 2023-10-26 LAB — RESP PANEL BY RT-PCR (RSV, FLU A&B, COVID)  RVPGX2
Influenza A by PCR: NEGATIVE
Influenza B by PCR: NEGATIVE
Resp Syncytial Virus by PCR: NEGATIVE
SARS Coronavirus 2 by RT PCR: NEGATIVE

## 2023-10-26 LAB — TROPONIN I (HIGH SENSITIVITY)
Troponin I (High Sensitivity): 7 ng/L (ref <18)
Troponin I (High Sensitivity): 6 ng/L (ref <18)

## 2023-10-26 LAB — BRAIN NATRIURETIC PEPTIDE: B Natriuretic Peptide: 423.4 pg/mL — ABNORMAL HIGH (ref 0.0–100.0)

## 2023-10-26 MED ORDER — ATORVASTATIN CALCIUM 20 MG PO TABS
20.0000 mg | ORAL_TABLET | Freq: Every day | ORAL | Status: DC
  Administered 2023-10-27 – 2023-10-29 (×3): 20 mg via ORAL
  Filled 2023-10-26 (×3): qty 1

## 2023-10-26 MED ORDER — IOHEXOL 350 MG/ML SOLN
100.0000 mL | Freq: Once | INTRAVENOUS | Status: AC | PRN
  Administered 2023-10-26: 100 mL via INTRAVENOUS

## 2023-10-26 MED ORDER — ACETAMINOPHEN 650 MG RE SUPP: 650.0000 mg | Freq: Four times a day (QID) | RECTAL | Status: DC | PRN

## 2023-10-26 MED ORDER — ALBUTEROL SULFATE (2.5 MG/3ML) 0.083% IN NEBU: 2.5000 mg | INHALATION_SOLUTION | RESPIRATORY_TRACT | Status: DC | PRN

## 2023-10-26 MED ORDER — ASPIRIN 81 MG PO TBEC
81.0000 mg | DELAYED_RELEASE_TABLET | Freq: Two times a day (BID) | ORAL | Status: DC
  Administered 2023-10-27 – 2023-10-28 (×3): 81 mg via ORAL
  Filled 2023-10-26 (×3): qty 1

## 2023-10-26 MED ORDER — ESCITALOPRAM OXALATE 10 MG PO TABS
20.0000 mg | ORAL_TABLET | Freq: Every day | ORAL | Status: DC
  Administered 2023-10-27 – 2023-10-29 (×3): 20 mg via ORAL
  Filled 2023-10-26 (×3): qty 2

## 2023-10-26 MED ORDER — BISACODYL 5 MG PO TBEC: 5.0000 mg | DELAYED_RELEASE_TABLET | Freq: Every day | ORAL | Status: DC | PRN

## 2023-10-26 MED ORDER — PANTOPRAZOLE SODIUM 40 MG PO TBEC
40.0000 mg | DELAYED_RELEASE_TABLET | Freq: Every day | ORAL | Status: DC
  Administered 2023-10-27 – 2023-10-29 (×3): 40 mg via ORAL
  Filled 2023-10-26 (×3): qty 1

## 2023-10-26 MED ORDER — POLYETHYLENE GLYCOL 3350 17 G PO PACK
17.0000 g | PACK | Freq: Every day | ORAL | Status: DC
  Administered 2023-10-27: 17 g via ORAL
  Filled 2023-10-26 (×5): qty 1

## 2023-10-26 MED ORDER — ENOXAPARIN SODIUM 40 MG/0.4ML IJ SOSY
40.0000 mg | PREFILLED_SYRINGE | INTRAMUSCULAR | Status: DC
  Administered 2023-10-26 – 2023-10-28 (×3): 40 mg via SUBCUTANEOUS
  Filled 2023-10-26 (×3): qty 0.4

## 2023-10-26 MED ORDER — ONDANSETRON HCL 4 MG/2ML IJ SOLN: 4.0000 mg | Freq: Four times a day (QID) | INTRAMUSCULAR | Status: DC | PRN

## 2023-10-26 MED ORDER — INSULIN ASPART 100 UNIT/ML IJ SOLN
0.0000 [IU] | Freq: Three times a day (TID) | INTRAMUSCULAR | Status: DC
  Administered 2023-10-27: 3 [IU] via SUBCUTANEOUS
  Filled 2023-10-26 (×2): qty 1

## 2023-10-26 MED ORDER — ONDANSETRON HCL 4 MG PO TABS: 4.0000 mg | ORAL_TABLET | Freq: Four times a day (QID) | ORAL | Status: DC | PRN

## 2023-10-26 MED ORDER — ACETAMINOPHEN 325 MG PO TABS
650.0000 mg | ORAL_TABLET | Freq: Four times a day (QID) | ORAL | Status: DC | PRN
  Administered 2023-10-26 – 2023-10-29 (×3): 650 mg via ORAL
  Filled 2023-10-26 (×3): qty 2

## 2023-10-26 MED ORDER — LEVOTHYROXINE SODIUM 50 MCG PO TABS
25.0000 ug | ORAL_TABLET | Freq: Every day | ORAL | Status: DC
  Administered 2023-10-27 – 2023-10-29 (×3): 25 ug via ORAL
  Filled 2023-10-26 (×3): qty 1

## 2023-10-26 NOTE — H&P (Signed)
 History and Physical   Michelle Hawkins RUE:[id number] DOB: [birth year] DOA: 10/26/2023 PCP: Rex Castor, MD  Chief Complaint: SOB Historian: patient  HPI:  Michelle Hawkins is a 78 y.o. female with a PMH significant for HTN, HLD, GERD, diabetes, history of breast cancer, hypothyroidism, anemia. At baseline, they live independently and are self-sufficient with ADLs.  They presented from [place]  to the ED on 10/26/2023 with family members.  She states that she woke up this morning with uncomfortable feeling in her throat area.  She associates this with having increase in her seasonal allergies recently.  She took 2 Tylenol 's for treatment without any relief.  Also endorses symptoms consistent with postnasal drainage, sinus congestion.  Does not take any allergy medications.  The discomfort in her throat area persisted throughout the day and she noticed that she was having increased dyspnea while at rest.  Exertion worsened her shortness of breath.  She endorses pleural discomfort with deep inhalation.  Denies cough. This afternoon, she was in the car for 3 hours and a ride from [place]  and denies that it made her symptoms worse and denies any lower extremity swelling.  She does not take blood thinners.  In the ED, it was found that they had new oxygen  requirement of 3 L in order to maintain saturations greater than 92%, heart rate in the 50 to 60s, normal respiratory rate, blood pressure in 110s over 50s.  Significant findings included: Na+ 126, K+ 4.0, creatinine 0.82, glucose 166.  WBC 14.8.  Troponin 7>6.  BNP 423.4  They were initially treated with oxygen .   Patient was admitted to medicine service for further workup and management of hypoxia as outlined in detail below.  Assessment/Plan Principal Problem:   SOB (shortness of breath) Active Problems:   Chest pain   Hypoxia   Hypoxia-no oxygen  use at baseline.  Requiring 3 L nasal cannula to maintain oxygen  saturations greater  than 92% currently. Etiology not confirmed but has symptoms that could be consistent with viral pulmonary infection as well as imaging findings consistent with cardiac in nature.  I suspect primarily cardiac but ordered respiratory viral panel.  Mild leukocytosis of 14.8.  Denies any history of COPD, emphysema.  Has 20-pack-year smoking history and quit approximately 19 years ago.  CTA chest negative for acute PE.  Lower extremity DVT evaluation negative.  However, Doppler did show pulsatile venous waveforms bilaterally which is typically seen with right heart dysfunction and tricuspid regurgitation.  CTA showed dilated central pulmonary arteries suggestive of pulmonary hypertension.  Also showing groundglass opacities with a predominantly perihilar distribution left worse than right.  Euvolemic on exam with clear sounding lungs. - Follow-up respiratory viral panel - Echo - Wean to room air as tolerated - Tomorrow, ambulate with pulse ox - Initiate empiric treatment of CAP and can be discontinued if suspicion for infection is low  Pulmonary hypertension-as suggested by findings on CTA chest.  Last echo in chart is from 2014.  Denies history of heart failure.  Follows with [facility] clinic cardiology.  Last known echo was in 2014.  At that time, EF 60 to 65% with normal left ventricular function and grade 1 DD.  Trivial regurgitation of MV, TV.  Pulmonary arteries had normal range systolic pressure at that time. Troponin 7>6.  BNP 423.4 - Echo ordered - Hold off on additional blood pressure management until evaluating ventricular function on echo  Hyponatremia-appears to have mild hyponatremia chronically but is worsened on admission.  Na+  126.  Endorses poor p.o. intake today which likely contributed. - Tolerating p.o. so will encourage p.o. fluid intake and recheck sodium in the morning  Type II DM-on metformin  twice daily - Hold metformin  in setting of contrast study done - CBG monitoring - Add on  hemoglobin A1c  Hypothyroidism-continue home levothyroxine   Past Medical History:  Diagnosis Date   Allergic rhinitis    Anxiety    Breast cancer (HCC) 1996   s/p left breast lumpectomy (lymph node dissection - 2/11 positive), chemotherapy   Breast cancer (HCC) 1995   right breast lumpectomy   Complication of anesthesia    slow to wake   Depression    Diabetes mellitus    Dysphagia    Family history of adverse reaction to anesthesia    Mother - slow to wake   GERD (gastroesophageal reflux disease)    H/O ulcer disease    PUD   Hypercholesterolemia    Hypertension    Hypothyroidism    multinodular goiter   Nephrolithiasis    Personal history of chemotherapy    Personal history of radiation therapy    Scoliosis    Skin cancer    SVT (supraventricular tachycardia) (HCC)     Past Surgical History:  Procedure Laterality Date   ABDOMINAL HYSTERECTOMY     partial   BREAST BIOPSY Left 09/2012   benign   BREAST LUMPECTOMY Left 1996   lumpectomy with chemo and rad tx for breast ca (lymph node dissection - 2/11 positive   BREAST LUMPECTOMY Right 1995   lumpectomy only   BREAST LUMPECTOMY WITH AXILLARY LYMPH NODE DISSECTION  1996   left   CATARACT EXTRACTION W/PHACO Left 11/29/2020   Procedure: CATARACT EXTRACTION PHACO AND INTRAOCULAR LENS PLACEMENT (IOC) LEFT DIABETIC VIVITY TORIC;  Surgeon: Clair Crews, MD;  Location: [facility];  Service: Ophthalmology;  Laterality: Left;  Diabetic - oral meds 6.88 00:40.7   CATARACT EXTRACTION W/PHACO Right 12/13/2020   Procedure: CATARACT EXTRACTION PHACO AND INTRAOCULAR LENS PLACEMENT (IOC) RIGHT DIABETIC VIVITY TORIC 5.44 00:40.3;  Surgeon: Clair Crews, MD;  Location: [facility];  Service: Ophthalmology;  Laterality: Right;  Diabetic - oral meds   COLONOSCOPY WITH PROPOFOL  N/A 06/03/2019   Procedure: COLONOSCOPY WITH PROPOFOL ;  Surgeon: [place] Skeeter, MD;  Location: [facility] ENDOSCOPY;  Service: Endoscopy;   Laterality: N/A;   ESOPHAGOGASTRODUODENOSCOPY N/A 06/03/2019   Procedure: ESOPHAGOGASTRODUODENOSCOPY (EGD);  Surgeon: [place] Skeeter, MD;  Location: [facility] ENDOSCOPY;  Service: Endoscopy;  Laterality: N/A;   LUMBAR LAMINECTOMY  3/07   L5-S1     reports that she quit smoking about 19 years ago. Her smoking use included cigarettes. She started smoking about 59 years ago. She has a 20 pack-year smoking history. She has never used smokeless tobacco. She reports that she does not drink alcohol and does not use drugs.  No Known Allergies  Family History  Problem Relation Age of Onset   Heart disease Father    Hypertension Father    Diabetes Father    Alzheimer's disease Mother    Diabetes Sister    Diabetes Sister    Diabetes Sister    Diabetes Sister     Prior to Admission medications   Medication Sig Start Date End Date Taking? Authorizing Provider  Accu-Chek Softclix Lancets lancets Check blood sugar twice daily Dx 250.00 06/28/20   Dellar Fenton, MD  aspirin  81 MG tablet Take 81 mg by mouth 2 (two) times daily.     [provider]  atorvastatin  (LIPITOR) 20 MG tablet TAKE ONE TABLET EVERY DAY 02/12/20   Dellar Fenton, MD  Biotin 5 MG CAPS Take by mouth.    [provider]  Blood Glucose Monitoring Suppl (ONE TOUCH ULTRA SYSTEM KIT) w/Device KIT 1 kit by Does not apply route once.    [provider]  calcium  citrate (CALCITRATE - DOSED IN MG ELEMENTAL CALCIUM ) 950 MG tablet Take 200 mg of elemental calcium  by mouth daily.    [provider]  cholecalciferol (VITAMIN D) 400 UNITS TABS Take by mouth.    [provider]  Continuous Blood Gluc Receiver (FREESTYLE LIBRE 14 DAY READER) DEVI Inject 1 Device into the skin every 14 (fourteen) days. Place 1 sensor every 14 days. Use to check sugar at least 4 times daily 08/17/19   Dellar Fenton, MD  Continuous Blood Gluc Sensor (FREESTYLE LIBRE 14 DAY SENSOR) MISC Inject 1 Device into the skin  every 14 (fourteen) days. 08/17/19   Dellar Fenton, MD  escitalopram  (LEXAPRO ) 20 MG tablet TAKE ONE TABLET EVERY DAY 04/01/20   Scott, Charlene, MD  ferrous sulfate 325 (65 FE) MG EC tablet Take 325 mg by mouth daily with breakfast.    [provider]  fish oil-omega-3 fatty acids 1000 MG capsule Take 2 g by mouth daily.    [provider]  glucose blood (ONE TOUCH ULTRA TEST) test strip CHECK BLOOD SUGAR UP TO THREE TIMES DAILY Dx E11.9 06/28/20   Dellar Fenton, MD  levothyroxine  (SYNTHROID ) 25 MCG tablet TAKE 1 TABLET EVERY DAY ON EMPTY STOMACHWITH A GLASS OF WATER AT LEAST 30-60 MINBEFORE BREAKFAST 06/13/20   Dellar Fenton, MD  losartan  (COZAAR ) 100 MG tablet TAKE 1 TABLET BY MOUTH DAILY 05/10/20   Dellar Fenton, MD  metFORMIN  (GLUCOPHAGE -XR) 500 MG 24 hr tablet TAKE TWO TABLETS TWICE DAILY 06/01/19   Dellar Fenton, MD  metoprolol  succinate (TOPROL -XL) 50 MG 24 hr tablet TAKE 1 TABLET BY MOUTH DAILY WITH OR FOLLOWING A MEAL 06/13/20   Dellar Fenton, MD  Multiple Vitamin (MULTIVITAMIN WITH MINERALS) TABS tablet Take 1 tablet by mouth daily.    [provider]  omeprazole  (PRILOSEC) 40 MG capsule TAKE 1 CAPSULE EVERY DAY 04/01/20   Dellar Fenton, MD  Probiotic Product (PROBIOTIC DAILY PO) Take 1 tablet by mouth.    [provider]  TURMERIC PO Take by mouth daily.     [provider]   I have personally, briefly reviewed patient's prior medical records in [facility]  Objective: Blood pressure (!) 113/53, pulse (!) 56, temperature 98.7 F (37.1 C), temperature source Oral, resp. rate 16, SpO2 94%.   Constitutional: NAD, calm, comfortable HEENT: lids and conjunctivae normal. MMM. Posterior pharynx clear of any exudate or lesions.  No plaques, erythema, swelling of pharynx. Neck: Bilateral nontender lymphadenopathy appreciated on exam Respiratory: CTAB, no wheezing, no crackles. Normal respiratory effort. No accessory muscle use.   Cardiovascular: RRR, no murmurs / rubs / gallops. No extremity edema. 2+ pedal pulses. no clubbing / cyanosis.  Musculoskeletal: No joint deformity upper and lower extremities. Normal muscle tone.  Skin: dry, intact, normal color, normal temperature on exposed skin Neurologic: Alert and oriented x 3. Normal speech. Grossly non-focal exam. PERRL Psychiatric: Normal mood. Congruent affect.  Labs on Admission: I have personally reviewed admission labs and imaging studies  CBC    Component Value Date/Time   WBC 13.7 (H) 10/26/2023 2033   RBC 3.52 (L) 10/26/2023 2033   HGB 9.7 (  L) 10/26/2023 2033   HCT 30.0 (L) 10/26/2023 2033   PLT 316 10/26/2023 2033   MCV 85.2 10/26/2023 2033   MCH 27.6 10/26/2023 2033   MCHC 32.3 10/26/2023 2033   RDW 14.2 10/26/2023 2033   LYMPHSABS 2.8 03/01/2020 1510   MONOABS 1.2 (H) 03/01/2020 1510   EOSABS 0.4 03/01/2020 1510   BASOSABS 0.1 03/01/2020 1510   CMP     Component Value Date/Time   NA 126 (L) 10/26/2023 1828   K 4.0 10/26/2023 1828   CL 97 (L) 10/26/2023 1828   CO2 21 (L) 10/26/2023 1828   GLUCOSE 166 (H) 10/26/2023 1828   BUN 21 10/26/2023 1828   CREATININE 0.83 10/26/2023 2033   CALCIUM  9.0 10/26/2023 1828   PROT 7.1 10/26/2023 1828   ALBUMIN 3.4 (L) 10/26/2023 1828   AST 28 10/26/2023 1828   ALT 21 10/26/2023 1828   ALKPHOS 61 10/26/2023 1828   BILITOT 0.9 10/26/2023 1828   GFRNONAA >60 10/26/2023 2033   GFRAA >60 07/29/2019 1234    Radiological Exams on Admission: US  Venous Img Lower Bilateral (DVT) Result Date: 10/26/2023 CLINICAL DATA:  [id number] with shortness of breath. EXAM: BILATERAL LOWER EXTREMITY VENOUS DOPPLER ULTRASOUND TECHNIQUE: Gray-scale sonography with graded compression, as well as color Doppler and duplex ultrasound were performed to evaluate the lower extremity deep venous systems from the level of the common femoral vein and including the common femoral, femoral, profunda femoral, popliteal and calf veins  including the posterior tibial, peroneal and gastrocnemius veins when visible. The superficial great saphenous vein was also interrogated. Spectral Doppler was utilized to evaluate flow at rest and with distal augmentation maneuvers in the common femoral, femoral and popliteal veins. COMPARISON:  None Available. FINDINGS: RIGHT LOWER EXTREMITY Common Femoral Vein: No evidence of thrombus. Normal compressibility, respiratory phasicity and response to augmentation. Saphenofemoral Junction: No evidence of thrombus. Normal compressibility and flow on color Doppler imaging. Profunda Femoral Vein: No evidence of thrombus. Normal compressibility and flow on color Doppler imaging. Femoral Vein: No evidence of thrombus. Normal compressibility, respiratory phasicity and response to augmentation. Popliteal Vein: No evidence of thrombus. Normal compressibility, respiratory phasicity and response to augmentation. Calf Veins: No evidence of thrombus. Normal compressibility and flow on color Doppler imaging. Superficial Great Saphenous Vein: No evidence of thrombus. Normal compressibility. Venous Reflux:  None. Other Findings:  Pulsatile venous waveforms. LEFT LOWER EXTREMITY Common Femoral Vein: No evidence of thrombus. Normal compressibility, respiratory phasicity and response to augmentation. Saphenofemoral Junction: No evidence of thrombus. Normal compressibility and flow on color Doppler imaging. Profunda Femoral Vein: No evidence of thrombus. Normal compressibility and flow on color Doppler imaging. Femoral Vein: No evidence of thrombus. Normal compressibility, respiratory phasicity and response to augmentation. Popliteal Vein: No evidence of thrombus. Normal compressibility, respiratory phasicity and response to augmentation. Calf Veins: No evidence of thrombus. Normal compressibility and flow on color Doppler imaging. Superficial Great Saphenous Vein: No evidence of thrombus. Normal compressibility. Venous Reflux:  None.  Other Findings:  Pulsatile venous waveforms. IMPRESSION: 1. No evidence of deep venous thrombosis in either lower extremity. 2. Pulsatile venous waveforms bilaterally. This may be seen with right heart dysfunction and tricuspid regurgitation. Electronically Signed   By: Denman Fischer M.D.   On: 10/26/2023 21:39   CT Angio Chest PE W/Cm &/Or Wo Cm Result Date: 10/26/2023 CLINICAL DATA:  Pulmonary embolism (PE) suspected, high prob EXAM: CT ANGIOGRAPHY CHEST WITH CONTRAST TECHNIQUE: Multidetector CT imaging of the chest was performed using the standard protocol during  bolus administration of intravenous contrast. Multiplanar CT image reconstructions and MIPs were obtained to evaluate the vascular anatomy. RADIATION DOSE REDUCTION: This exam was performed according to the departmental dose-optimization program which includes automated exposure control, adjustment of the mA and/or kV according to patient size and/or use of iterative reconstruction technique. CONTRAST:  OMNIPAQUE  IOHEXOL  350 MG/ML SOLN COMPARISON:  05/16/2021 by report only FINDINGS: Cardiovascular: Heart size normal. No pericardial effusion. Dilated central pulmonary arteries suggesting pulmonary hypertension. Satisfactory opacification of pulmonary arteries noted, and there is no evidence of pulmonary emboli. Scattered coronary calcifications. Adequate contrast opacification of the thoracic aorta with no evidence of dissection, aneurysm, or stenosis. There is classic 3-vessel brachiocephalic arch anatomy without proximal stenosis. Calcified plaque in the arch and descending thoracic aorta without displaced intimal calcifications. Mediastinum/Nodes: Enlarged 1.2 cm anterior mediastinal lymph node. No other adenopathy identified. Surgical clips left axilla. Lungs/Pleura: No pleural effusion. No pneumothorax. Somewhat geographic ground-glass opacities with a predominantly perihilar distribution, left worse than right. Upper Abdomen: 9 mm  calcified gallstone. 8 mm calcified granuloma in the posterior right hepatic lobe. No acute findings. Musculoskeletal: Mild thoracolumbar scoliosis with spondylitic change near the thoracolumbar junction. No acute findings. Review of the MIP images confirms the above findings. IMPRESSION: 1. Negative for acute PE or thoracic aortic dissection. 2. Dilated central pulmonary arteries suggesting pulmonary hypertension. 3. Geographic ground-glass opacities with a predominantly perihilar distribution, left worse than right. 4. Cholelithiasis. 5. Coronary and aortic Atherosclerosis (ICD10-I70.0). Electronically Signed   By: Nicoletta Barrier M.D.   On: 10/26/2023 19:41   DG Chest 1 View Result Date: 10/26/2023 CLINICAL DATA:  [id number] Chest pain [id number] EXAM: CHEST  1 VIEW COMPARISON:  04/19/2023 FINDINGS: Mild alveolar opacities in the left lower lung. Right lung clear. No pneumothorax. Heart size and mediastinal contours are within normal limits. Aortic Atherosclerosis (ICD10-170.0). No effusion. Left axillary clips. IMPRESSION: Mild left lower lung opacities. Electronically Signed   By: Nicoletta Barrier M.D.   On: 10/26/2023 19:32    EKG: Independently reviewed.  NSR HR 67  DVT prophylaxis: enoxaparin  (LOVENOX ) injection 40 mg Start: 10/26/23 2200 SCDs Start: 10/26/23 2005   Code Status: Full Family Communication: Daughters at bedside Disposition Plan: Admit to [employer] Consults called: None  Ree Candy, DO [facility]  10/26/2023, 10:49 PM    To contact the appropriate [facility] Attending or Consulting provider: Check [email or web address] for coverage from 7pm-7am

## 2023-10-26 NOTE — ED Provider Notes (Signed)
 [facility] Provider Note    Event Date/Time   First MD Initiated Contact with Patient 10/26/23 1806     (approximate)   History   Chest Pain   HPI  Michelle Hawkins is a 78 y.o. female history of hypertension, hyperlipidemia, diabetes, remote history of breast cancer, not on current treatment or chemo, presenting with shortness of breath as well as chest pain.  Started this morning.  Patient states that she noticed some upper sternal aching that go down to the substernal region.  Is not tearing in nature, no nausea, vomiting.  Has intermittent cough no fever, no leg swelling.  No history of CHF.  Patient was a longtime smoker but stopped 20 years ago.  No history of blood clots, not on any hormones, no recent surgeries.  Did travel up from [place]  today.  Daughter states that patient was having the symptoms even on the ride up to [place] .  She is not on any oxygen  at baseline, no history of COPD or asthma.  Independent history obtained from daughter.  On independent review, she is seen by primary care doctor in February of this year, has history of hypothyroidism, history of diabetes, last A1c was 7 in October 2024.     Physical Exam   Triage Vital Signs: ED Triage Vitals  Encounter Vitals Group     BP 10/26/23 1804 (!) 135/58     Systolic BP Percentile --      Diastolic BP Percentile --      Pulse Rate 10/26/23 1804 62     Resp 10/26/23 1804 20     Temp 10/26/23 1804 98.7 F (37.1 C)     Temp Source 10/26/23 1804 Oral     SpO2 10/26/23 1804 92 %     Weight --      Height --      Head Circumference --      Peak Flow --      Pain Score 10/26/23 1803 7     Pain Loc --      Pain Education --      Exclude from Growth Chart --     Most recent vital signs: Vitals:   10/26/23 1804 10/26/23 1900  BP: (!) 135/58 (!) 113/53  Pulse: 62 (!) 56  Resp: 20 16  Temp: 98.7 F (37.1 C)   SpO2: 92% 94%     General: Awake, no distress.   CV:  Good peripheral perfusion.  Resp:  Normal effort.  No tachypnea or increased work of breathing Abd:  No distention.  Soft, nontender Other:  No unilateral counseling or tenderness, no lower extremity edema.   ED Results / Procedures / Treatments   Labs (all labs ordered are listed, but only abnormal results are displayed) Labs Reviewed  BASIC METABOLIC PANEL WITH GFR - Abnormal; Notable for the following components:      Result Value   Sodium 126 (*)    Chloride 97 (*)    CO2 21 (*)    Glucose, Bld 166 (*)    All other components within normal limits  CBC - Abnormal; Notable for the following components:   WBC 14.8 (*)    RBC 3.68 (*)    Hemoglobin 10.4 (*)    HCT 31.1 (*)    All other components within normal limits  HEPATIC FUNCTION PANEL - Abnormal; Notable for the following components:   Albumin 3.4 (*)    All other components within  normal limits  RESP PANEL BY RT-PCR (RSV, FLU A&B, COVID)  [id number]  BRAIN NATRIURETIC PEPTIDE  TROPONIN I (HIGH SENSITIVITY)  TROPONIN I (HIGH SENSITIVITY)     EKG  Sinus rhythm with occasional PVCs, rate of 67, normal QRS, normal QTc, no ischemic ST elevation, T wave flattening in aVL, V2, no prior to compare   RADIOLOGY CT PE study on my independent interpretation without obvious PE   PROCEDURES:  Critical Care performed: Yes, see critical care procedure note(s)  .Critical Care  Performed by: Shane Darling, MD Authorized by: Shane Darling, MD   Critical care provider statement:    Critical care time (minutes):  40   Critical care was necessary to treat or prevent imminent or life-threatening deterioration of the following conditions:  Respiratory failure   Critical care was time spent personally by me on the following activities:  Development of treatment plan with patient or surrogate, discussions with consultants, evaluation of patient's response to treatment, examination of patient, ordering and review of laboratory  studies, ordering and review of radiographic studies, ordering and performing treatments and interventions, pulse oximetry, re-evaluation of patient's condition and review of old charts    MEDICATIONS ORDERED IN ED: Medications  iohexol  (OMNIPAQUE ) 350 MG/ML injection 100 mL (100 mLs Intravenous Contrast Given 10/26/23 1918)     IMPRESSION / MDM / ASSESSMENT AND PLAN / ED COURSE  I reviewed the triage vital signs and the nursing notes.                              Differential diagnosis includes, but is not limited to, PE, viral illness, pneumonia, ACS, consider CHF but patient does not have lower any lower extremity edema.  Will get labs, EKG, troponin, chest x-ray, CT PE study.  She will likely need to be admitted for further management.  Shared decision making done with daughter and patient and they are agreeable with the plan.  Patient's presentation is most consistent with acute presentation with potential threat to life or bodily function.  Independent review of labs and imaging below.  Given her hypoxia, groundglass opacities as well as findings of pulmonary hypertension and CT PE study, she will need to be admitted for further management.  Consult hospitalist was agreeable plan for admission will evaluate the patient.  She is admitted.  Clinical Course as of 10/26/23 1959  Sat Oct 26, 2023  1814 When I went to assess patient, she was satting 88% persistently with a good waveform, placed her on 2 L nasal cannula, oxygen  sat went up to 91%, up to 3 L and now 94%. [TT]  1943 CT Angio Chest PE W/Cm &/Or Wo Cm IMPRESSION: 1. Negative for acute PE or thoracic aortic dissection. 2. Dilated central pulmonary arteries suggesting pulmonary hypertension. 3. Geographic ground-glass opacities with a predominantly perihilar distribution, left worse than right. 4. Cholelithiasis. 5. Coronary and aortic Atherosclerosis (ICD10-I70.0)   [TT]  1946 Independent review of labs, LFTs are normal,  she is some hyponatremia, creatinine is normal, leukocytosis noted, troponin is negative, respiratory viral panel is negative. [TT]    Clinical Course User Index [TT] Drenda Gentle, Richard Champion, MD     FINAL CLINICAL IMPRESSION(S) / ED DIAGNOSES   Final diagnoses:  Chest pain, unspecified type  Hypoxia  Shortness of breath     Rx / DC Orders   ED Discharge Orders     None  Note:  This document was prepared using Dragon voice recognition software and may include unintentional dictation errors.    Shane Darling, MD 10/26/23 [phone]

## 2023-10-26 NOTE — ED Triage Notes (Signed)
 Pt to ED via POV from home. Pt reports started having neck pain that radiates down her chest. Pt also reports SOB. Pt recently traveling from [facility]. Pt denies blood thinners.

## 2023-10-26 NOTE — ED Notes (Signed)
Hospitalist at bedside with pt at this time.

## 2023-10-26 NOTE — ED Notes (Signed)
 Food tray and drink provided per pt request.

## 2023-10-27 ENCOUNTER — Observation Stay
Admit: 2023-10-27 | Discharge: 2023-10-27 | Disposition: A | Discharge status: Home / Self Care | Attending: Student in an Organized Health Care Education/Training Program | Admitting: Student in an Organized Health Care Education/Training Program

## 2023-10-27 DIAGNOSIS — I272 Pulmonary hypertension, unspecified: Secondary | ICD-10-CM

## 2023-10-27 DIAGNOSIS — R0789 Other chest pain: Secondary | ICD-10-CM

## 2023-10-27 DIAGNOSIS — R0902 Hypoxemia: Secondary | ICD-10-CM | POA: Diagnosis not present

## 2023-10-27 DIAGNOSIS — E119 Type 2 diabetes mellitus without complications: Secondary | ICD-10-CM

## 2023-10-27 DIAGNOSIS — R0602 Shortness of breath: Secondary | ICD-10-CM | POA: Diagnosis not present

## 2023-10-27 LAB — BASIC METABOLIC PANEL WITH GFR
Anion gap: 5 (ref 5–15)
BUN: 20 mg/dL (ref 8–23)
CO2: 23 mmol/L (ref 22–32)
Calcium: 8.8 mg/dL — ABNORMAL LOW (ref 8.9–10.3)
Chloride: 102 mmol/L (ref 98–111)
Creatinine, Ser: 0.87 mg/dL (ref 0.44–1.00)
GFR, Estimated: >60 mL/min (ref >60)
Glucose, Bld: 189 mg/dL — ABNORMAL HIGH (ref 70–99)
Potassium: 3.9 mmol/L (ref 3.5–5.1)
Sodium: 130 mmol/L — ABNORMAL LOW (ref 135–145)

## 2023-10-27 LAB — RESPIRATORY PANEL BY PCR

## 2023-10-27 LAB — CBC
HCT: 29.6 % — ABNORMAL LOW (ref 36.0–46.0)
Hemoglobin: 9.6 g/dL — ABNORMAL LOW (ref 12.0–15.0)
MCH: 27.7 pg (ref 26.0–34.0)
MCHC: 32.4 g/dL (ref 30.0–36.0)
MCV: 85.3 fL (ref 80.0–100.0)
Platelets: UNDETERMINED 10*3/uL (ref 150–400)
RBC: 3.47 MIL/uL — ABNORMAL LOW (ref 3.87–5.11)
RDW: 14.2 % (ref 11.5–15.5)
WBC: 13.8 10*3/uL — ABNORMAL HIGH (ref 4.0–10.5)
nRBC: 0 % (ref 0.0–0.2)

## 2023-10-27 LAB — HEMOGLOBIN A1C
Hgb A1c MFr Bld: 6.8 % — ABNORMAL HIGH (ref 4.8–5.6)
Mean Plasma Glucose: 148.46 mg/dL

## 2023-10-27 LAB — GLUCOSE, CAPILLARY
Glucose-Capillary: 134 mg/dL — ABNORMAL HIGH (ref 70–99)
Glucose-Capillary: 136 mg/dL — ABNORMAL HIGH (ref 70–99)
Glucose-Capillary: 246 mg/dL — ABNORMAL HIGH (ref 70–99)

## 2023-10-27 MED ORDER — LORATADINE 10 MG PO TABS
10.0000 mg | ORAL_TABLET | Freq: Every day | ORAL | Status: DC
Start: 2023-10-27 — End: 2023-10-29
  Administered 2023-10-27 – 2023-10-29 (×3): 10 mg via ORAL
  Filled 2023-10-27 (×3): qty 1

## 2023-10-27 NOTE — Plan of Care (Signed)
  Problem: Pain Managment: Goal: General experience of comfort will improve and/or be controlled Outcome: Progressing   Problem: Safety: Goal: Ability to remain free from injury will improve Outcome: Progressing   Problem: Skin Integrity: Goal: Risk for impaired skin integrity will decrease Outcome: Progressing

## 2023-10-27 NOTE — Progress Notes (Signed)
 Progress Note   Patient: Michelle Hawkins FAO:[id number] DOB: 05-Jul-1946 DOA: 10/26/2023     0 DOS: the patient was seen and examined on 10/27/2023   Brief hospital course: Michelle Hawkins is a 78 y.o. female with a PMH significant for HTN, HLD, GERD, diabetes, history of breast cancer, hypothyroidism, anemia presented to emergency department for evaluation of heaviness of chest, pleuritic chest discomfort, shortness of breath admitted to [facility] service.  Assessment and Plan: Hypoxia Pulmonary hypertension Patient was on 3 L supplemental oxygen  which is gradually weaned off. Will need ambulatory oxygen  evaluation. Has 20-pack-year smoking history, CTA chest negative. CT showed findings suggestive of pulmonary hypertension, groundglass opacities. BNP 423, echocardiogram ordered. She will need cardiology evaluation for further management.  Hyponatremia Patient has chronically low sodium. Encourage p.o. oral intake. Trend sodium.  Type 2 diabetes mellitus A1c 6.  8. Continue Accu-Cheks, sliding scale insulin . Hold metformin .  Hypothyroidism Continue home dose levothyroxine .     Out of bed to chair. Incentive spirometry. Nursing supportive care. Fall, aspiration precautions. Diet:  Diet Orders (From admission, onward)     Start     Ordered   10/26/23 2005  Diet regular Room service appropriate? Yes; Fluid consistency: Thin  Diet effective now       Question Answer Comment  Room service appropriate? Yes   Fluid consistency: Thin      10/26/23 2006           DVT prophylaxis: enoxaparin  (LOVENOX ) injection 40 mg Start: 10/26/23 2200 SCDs Start: 10/26/23 2005  Level of care: Med-Surg   Code Status: Full Code  Subjective: Patient is seen and examined today morning.  She feels her chest discomfort is improved.  Did not get out of bed.  Eating fair.  Physical Exam: Vitals:   10/26/23 2030 10/26/23 2100 10/26/23 2143 10/27/23 0413  BP: (!) 119/58 (!) 133/57 (!) 133/58 (!)  109/58  Pulse: (!) 52 65 64 (!) 59  Resp: 13 16 16 20   Temp:   98.3 F (36.8 C) 98.3 F (36.8 C)  TempSrc:   Oral Oral  SpO2: 96% (!) 87% 92% 91%  Weight:      Height:        General - Elderly Caucasian female, no apparent distress HEENT - PERRLA, EOMI, atraumatic head, non tender sinuses. Lung - Clear, basal rales, no rhonchi, wheezes. Heart - S1, S2 heard, no murmurs, rubs, no pedal edema. Abdomen - Soft, non tender, bowel sounds good Neuro - Alert, awake and oriented x 3, non focal exam. Skin - Warm and dry.  Data Reviewed:      Latest Ref Rng & Units 10/27/2023    4:49 AM 10/26/2023    8:33 PM 10/26/2023    6:28 PM  CBC  WBC 4.0 - 10.5 K/uL 13.8  13.7  14.8   Hemoglobin 12.0 - 15.0 g/dL 9.6  9.7  69.6   Hematocrit 36.0 - 46.0 % 29.6  30.0  31.1   Platelets 150 - 400 K/uL PLATELET CLUMPS NOTED ON SMEAR, UNABLE TO ESTIMATE  316  264       Latest Ref Rng & Units 10/27/2023    4:49 AM 10/26/2023    8:33 PM 10/26/2023    6:28 PM  BMP  Glucose 70 - 99 mg/dL 295   284   BUN 8 - 23 mg/dL 20   21   Creatinine 1.32 - 1.00 mg/dL 4.40  1.02  7.25   Sodium 135 - 145 mmol/L 130  126   Potassium 3.5 - 5.1 mmol/L 3.9   4.0   Chloride 98 - 111 mmol/L 102   97   CO2 22 - 32 mmol/L 23   21   Calcium  8.9 - 10.3 mg/dL 8.8   9.0    US  Venous Img Lower Bilateral (DVT) Result Date: 10/26/2023 CLINICAL DATA:  [id number] with shortness of breath. EXAM: BILATERAL LOWER EXTREMITY VENOUS DOPPLER ULTRASOUND TECHNIQUE: Gray-scale sonography with graded compression, as well as color Doppler and duplex ultrasound were performed to evaluate the lower extremity deep venous systems from the level of the common femoral vein and including the common femoral, femoral, profunda femoral, popliteal and calf veins including the posterior tibial, peroneal and gastrocnemius veins when visible. The superficial great saphenous vein was also interrogated. Spectral Doppler was utilized to evaluate flow at rest and with  distal augmentation maneuvers in the common femoral, femoral and popliteal veins. COMPARISON:  None Available. FINDINGS: RIGHT LOWER EXTREMITY Common Femoral Vein: No evidence of thrombus. Normal compressibility, respiratory phasicity and response to augmentation. Saphenofemoral Junction: No evidence of thrombus. Normal compressibility and flow on color Doppler imaging. Profunda Femoral Vein: No evidence of thrombus. Normal compressibility and flow on color Doppler imaging. Femoral Vein: No evidence of thrombus. Normal compressibility, respiratory phasicity and response to augmentation. Popliteal Vein: No evidence of thrombus. Normal compressibility, respiratory phasicity and response to augmentation. Calf Veins: No evidence of thrombus. Normal compressibility and flow on color Doppler imaging. Superficial Great Saphenous Vein: No evidence of thrombus. Normal compressibility. Venous Reflux:  None. Other Findings:  Pulsatile venous waveforms. LEFT LOWER EXTREMITY Common Femoral Vein: No evidence of thrombus. Normal compressibility, respiratory phasicity and response to augmentation. Saphenofemoral Junction: No evidence of thrombus. Normal compressibility and flow on color Doppler imaging. Profunda Femoral Vein: No evidence of thrombus. Normal compressibility and flow on color Doppler imaging. Femoral Vein: No evidence of thrombus. Normal compressibility, respiratory phasicity and response to augmentation. Popliteal Vein: No evidence of thrombus. Normal compressibility, respiratory phasicity and response to augmentation. Calf Veins: No evidence of thrombus. Normal compressibility and flow on color Doppler imaging. Superficial Great Saphenous Vein: No evidence of thrombus. Normal compressibility. Venous Reflux:  None. Other Findings:  Pulsatile venous waveforms. IMPRESSION: 1. No evidence of deep venous thrombosis in either lower extremity. 2. Pulsatile venous waveforms bilaterally. This may be seen with right heart  dysfunction and tricuspid regurgitation. Electronically Signed   By: Denman Fischer M.D.   On: 10/26/2023 21:39   CT Angio Chest PE W/Cm &/Or Wo Cm Result Date: 10/26/2023 CLINICAL DATA:  Pulmonary embolism (PE) suspected, high prob EXAM: CT ANGIOGRAPHY CHEST WITH CONTRAST TECHNIQUE: Multidetector CT imaging of the chest was performed using the standard protocol during bolus administration of intravenous contrast. Multiplanar CT image reconstructions and MIPs were obtained to evaluate the vascular anatomy. RADIATION DOSE REDUCTION: This exam was performed according to the departmental dose-optimization program which includes automated exposure control, adjustment of the mA and/or kV according to patient size and/or use of iterative reconstruction technique. CONTRAST:  OMNIPAQUE  IOHEXOL  350 MG/ML SOLN COMPARISON:  05/16/2021 by report only FINDINGS: Cardiovascular: Heart size normal. No pericardial effusion. Dilated central pulmonary arteries suggesting pulmonary hypertension. Satisfactory opacification of pulmonary arteries noted, and there is no evidence of pulmonary emboli. Scattered coronary calcifications. Adequate contrast opacification of the thoracic aorta with no evidence of dissection, aneurysm, or stenosis. There is classic 3-vessel brachiocephalic arch anatomy without proximal stenosis. Calcified plaque in the arch and descending thoracic aorta without displaced intimal  calcifications. Mediastinum/Nodes: Enlarged 1.2 cm anterior mediastinal lymph node. No other adenopathy identified. Surgical clips left axilla. Lungs/Pleura: No pleural effusion. No pneumothorax. Somewhat geographic ground-glass opacities with a predominantly perihilar distribution, left worse than right. Upper Abdomen: 9 mm calcified gallstone. 8 mm calcified granuloma in the posterior right hepatic lobe. No acute findings. Musculoskeletal: Mild thoracolumbar scoliosis with spondylitic change near the thoracolumbar junction. No  acute findings. Review of the MIP images confirms the above findings. IMPRESSION: 1. Negative for acute PE or thoracic aortic dissection. 2. Dilated central pulmonary arteries suggesting pulmonary hypertension. 3. Geographic ground-glass opacities with a predominantly perihilar distribution, left worse than right. 4. Cholelithiasis. 5. Coronary and aortic Atherosclerosis (ICD10-I70.0). Electronically Signed   By: Nicoletta Barrier M.D.   On: 10/26/2023 19:41   DG Chest 1 View Result Date: 10/26/2023 CLINICAL DATA:  [id number] Chest pain [id number] EXAM: CHEST  1 VIEW COMPARISON:  04/19/2023 FINDINGS: Mild alveolar opacities in the left lower lung. Right lung clear. No pneumothorax. Heart size and mediastinal contours are within normal limits. Aortic Atherosclerosis (ICD10-170.0). No effusion. Left axillary clips. IMPRESSION: Mild left lower lung opacities. Electronically Signed   By: Nicoletta Barrier M.D.   On: 10/26/2023 19:32    Family Communication: Discussed with patient, family at bedside.  The understand and agree. All questions answered.  Disposition: Status is: Observation The patient remains OBS appropriate and will d/c before 2 midnights.  Planned Discharge Destination: Home     Time spent: 39 minutes  Author: Aisha Hove, MD 10/27/2023 12:11 PM Secure chat 7am to 7pm For on call review [email or web address].

## 2023-10-27 NOTE — Progress Notes (Signed)
*  PRELIMINARY RESULTS* Echocardiogram 2D Echocardiogram has been performed.  Michelle Hawkins [name] 10/27/2023, 1:27 PM

## 2023-10-28 ENCOUNTER — Observation Stay

## 2023-10-28 DIAGNOSIS — R0902 Hypoxemia: Secondary | ICD-10-CM | POA: Diagnosis not present

## 2023-10-28 DIAGNOSIS — I272 Pulmonary hypertension, unspecified: Secondary | ICD-10-CM | POA: Diagnosis not present

## 2023-10-28 DIAGNOSIS — R0602 Shortness of breath: Secondary | ICD-10-CM | POA: Diagnosis not present

## 2023-10-28 DIAGNOSIS — R0789 Other chest pain: Secondary | ICD-10-CM | POA: Diagnosis not present

## 2023-10-28 DIAGNOSIS — D649 Anemia, unspecified: Secondary | ICD-10-CM

## 2023-10-28 LAB — FERRITIN: Ferritin: 34 ng/mL (ref 11–307)

## 2023-10-28 LAB — GLUCOSE, CAPILLARY: Glucose-Capillary: 145 mg/dL — ABNORMAL HIGH (ref 70–99)

## 2023-10-28 LAB — FOLATE: Folate: >40 ng/mL (ref >5.9)

## 2023-10-28 LAB — IRON AND TIBC
Iron: 33 ug/dL (ref 28–170)
Saturation Ratios: 7 % — ABNORMAL LOW (ref 10.4–31.8)
TIBC: 458 ug/dL — ABNORMAL HIGH (ref 250–450)
UIBC: 425 ug/dL

## 2023-10-28 MED ORDER — LOSARTAN POTASSIUM 50 MG PO TABS
100.0000 mg | ORAL_TABLET | Freq: Every day | ORAL | Status: DC
  Administered 2023-10-28 – 2023-10-29 (×2): 100 mg via ORAL
  Filled 2023-10-28 (×2): qty 2

## 2023-10-28 MED ORDER — TECHNETIUM TC 99M TETROFOSMIN IV KIT
30.9000 | PACK | Freq: Once | INTRAVENOUS | Status: AC | PRN
  Administered 2023-10-28: 30.9 via INTRAVENOUS

## 2023-10-28 MED ORDER — REGADENOSON 0.4 MG/5ML IV SOLN
0.4000 mg | Freq: Once | INTRAVENOUS | Status: AC
  Administered 2023-10-28: 0.4 mg via INTRAVENOUS

## 2023-10-28 MED ORDER — ASPIRIN 81 MG PO TBEC
81.0000 mg | DELAYED_RELEASE_TABLET | Freq: Every day | ORAL | Status: DC
  Administered 2023-10-29: 81 mg via ORAL
  Filled 2023-10-28: qty 1

## 2023-10-28 MED ORDER — SENNA 8.6 MG PO TABS
2.0000 | ORAL_TABLET | Freq: Every day | ORAL | Status: DC
  Administered 2023-10-28: 17.2 mg via ORAL
  Filled 2023-10-28: qty 2

## 2023-10-28 MED ORDER — DOCUSATE SODIUM 100 MG PO CAPS
100.0000 mg | ORAL_CAPSULE | Freq: Two times a day (BID) | ORAL | Status: DC
  Administered 2023-10-28 – 2023-10-29 (×2): 100 mg via ORAL
  Filled 2023-10-28 (×2): qty 1

## 2023-10-28 MED ORDER — TECHNETIUM TC 99M TETROFOSMIN IV KIT
10.4900 | PACK | Freq: Once | INTRAVENOUS | Status: AC | PRN
  Administered 2023-10-28: 10.49 via INTRAVENOUS

## 2023-10-28 MED ORDER — METOPROLOL SUCCINATE ER 50 MG PO TB24
50.0000 mg | ORAL_TABLET | Freq: Every day | ORAL | Status: DC
  Administered 2023-10-28 – 2023-10-29 (×2): 50 mg via ORAL
  Filled 2023-10-28 (×2): qty 1

## 2023-10-28 NOTE — Plan of Care (Signed)
  Problem: Pain Managment: Goal: General experience of comfort will improve and/or be controlled Outcome: Progressing   Problem: Safety: Goal: Ability to remain free from injury will improve Outcome: Progressing   Problem: Skin Integrity: Goal: Risk for impaired skin integrity will decrease Outcome: Progressing

## 2023-10-28 NOTE — Care Management Obs Status (Signed)
 MEDICARE OBSERVATION STATUS NOTIFICATION   Patient Details  Name: Michelle Hawkins MRN: [id number] Date of Birth: 1946/02/13   Medicare Observation Status Notification Given:       Anise Kerns 10/28/2023, 12:56 PM

## 2023-10-28 NOTE — Progress Notes (Signed)
 Progress Note   Patient: Michelle Hawkins ZOX:[id number] DOB: 08/19/1945 DOA: 10/26/2023     0 DOS: the patient was seen and examined on 10/28/2023   Brief hospital course: MIRIYA CLOER is a 78 y.o. female with a PMH significant for HTN, HLD, GERD, diabetes, history of breast cancer, hypothyroidism, anemia presented to emergency department for evaluation of heaviness of chest, pleuritic chest discomfort, shortness of breath admitted to [facility] service.  Assessment and Plan: Hypoxia Pulmonary hypertension Patient was on 3 L supplemental oxygen  which is gradually weaned off. Has 20-pack-year smoking history, CTA chest negative. CT showed findings suggestive of pulmonary hypertension, groundglass opacities. Advised pulmonary follow up outpatient. BNP 423, echocardiogram pending. Cardiology evaluation appreciated- she had stress test report pending.  Hyponatremia Patient has chronically low sodium. Encourage p.o. oral intake. Trend sodium.  Type 2 diabetes mellitus A1c 6.  8. Continue Accu-Cheks, sliding scale insulin . Hold metformin .  Acute anemia- H/o gastric ulcers. Hb 9.6 down from baseline around 12 04/19/23. Will get iron profile, folate, b12 levels. Check for occult blood. Advised she may need GI evaluation for EGD.  Hypothyroidism Continue home dose levothyroxine .     Out of bed to chair. Incentive spirometry. Nursing supportive care. Fall, aspiration precautions. Diet:  Diet Orders (From admission, onward)     Start     Ordered   10/26/23 2005  Diet regular Room service appropriate? Yes; Fluid consistency: Thin  Diet effective now       Question Answer Comment  Room service appropriate? Yes   Fluid consistency: Thin      10/26/23 2006           DVT prophylaxis: enoxaparin  (LOVENOX ) injection 40 mg Start: 10/26/23 2200 SCDs Start: 10/26/23 2005  Level of care: Med-Surg   Code Status: Full Code  Subjective: Patient is seen and examined today morning.  She  feels better today. Eating fair. Daughter at bedside.  Did not get out of bed.   Physical Exam: Vitals:   10/28/23 0732 10/28/23 0734 10/28/23 1500 10/28/23 1532  BP: (!) 143/51 (!) 143/60 123/61 138/73  Pulse: 60 (!) 58 63 65  Resp: 16   16  Temp: 98.3 F (36.8 C)   97.9 F (36.6 C)  TempSrc:    Oral  SpO2: 93% 96%  95%  Weight:      Height:        General - Elderly Caucasian female, no apparent distress HEENT - PERRLA, EOMI, atraumatic head, non tender sinuses. Lung - Clear, basal rales, no rhonchi, wheezes. Heart - S1, S2 heard, no murmurs, rubs, no pedal edema. Abdomen - Soft, non tender, bowel sounds good Neuro - Alert, awake and oriented x 3, non focal exam. Skin - Warm and dry.  Data Reviewed:      Latest Ref Rng & Units 10/27/2023    4:49 AM 10/26/2023    8:33 PM 10/26/2023    6:28 PM  CBC  WBC 4.0 - 10.5 K/uL 13.8  13.7  14.8   Hemoglobin 12.0 - 15.0 g/dL 9.6  9.7  81.1   Hematocrit 36.0 - 46.0 % 29.6  30.0  31.1   Platelets 150 - 400 K/uL PLATELET CLUMPS NOTED ON SMEAR, UNABLE TO ESTIMATE  316  264       Latest Ref Rng & Units 10/27/2023    4:49 AM 10/26/2023    8:33 PM 10/26/2023    6:28 PM  BMP  Glucose 70 - 99 mg/dL 914   782  BUN 8 - 23 mg/dL 20   21   Creatinine 8.65 - 1.00 mg/dL 7.84  6.96  2.95   Sodium 135 - 145 mmol/L 130   126   Potassium 3.5 - 5.1 mmol/L 3.9   4.0   Chloride 98 - 111 mmol/L 102   97   CO2 22 - 32 mmol/L 23   21   Calcium  8.9 - 10.3 mg/dL 8.8   9.0    US  Venous Img Lower Bilateral (DVT) Result Date: 10/26/2023 CLINICAL DATA:  [id number] with shortness of breath. EXAM: BILATERAL LOWER EXTREMITY VENOUS DOPPLER ULTRASOUND TECHNIQUE: Gray-scale sonography with graded compression, as well as color Doppler and duplex ultrasound were performed to evaluate the lower extremity deep venous systems from the level of the common femoral vein and including the common femoral, femoral, profunda femoral, popliteal and calf veins including the  posterior tibial, peroneal and gastrocnemius veins when visible. The superficial great saphenous vein was also interrogated. Spectral Doppler was utilized to evaluate flow at rest and with distal augmentation maneuvers in the common femoral, femoral and popliteal veins. COMPARISON:  None Available. FINDINGS: RIGHT LOWER EXTREMITY Common Femoral Vein: No evidence of thrombus. Normal compressibility, respiratory phasicity and response to augmentation. Saphenofemoral Junction: No evidence of thrombus. Normal compressibility and flow on color Doppler imaging. Profunda Femoral Vein: No evidence of thrombus. Normal compressibility and flow on color Doppler imaging. Femoral Vein: No evidence of thrombus. Normal compressibility, respiratory phasicity and response to augmentation. Popliteal Vein: No evidence of thrombus. Normal compressibility, respiratory phasicity and response to augmentation. Calf Veins: No evidence of thrombus. Normal compressibility and flow on color Doppler imaging. Superficial Great Saphenous Vein: No evidence of thrombus. Normal compressibility. Venous Reflux:  None. Other Findings:  Pulsatile venous waveforms. LEFT LOWER EXTREMITY Common Femoral Vein: No evidence of thrombus. Normal compressibility, respiratory phasicity and response to augmentation. Saphenofemoral Junction: No evidence of thrombus. Normal compressibility and flow on color Doppler imaging. Profunda Femoral Vein: No evidence of thrombus. Normal compressibility and flow on color Doppler imaging. Femoral Vein: No evidence of thrombus. Normal compressibility, respiratory phasicity and response to augmentation. Popliteal Vein: No evidence of thrombus. Normal compressibility, respiratory phasicity and response to augmentation. Calf Veins: No evidence of thrombus. Normal compressibility and flow on color Doppler imaging. Superficial Great Saphenous Vein: No evidence of thrombus. Normal compressibility. Venous Reflux:  None. Other Findings:   Pulsatile venous waveforms. IMPRESSION: 1. No evidence of deep venous thrombosis in either lower extremity. 2. Pulsatile venous waveforms bilaterally. This may be seen with right heart dysfunction and tricuspid regurgitation. Electronically Signed   By: Denman Fischer M.D.   On: 10/26/2023 21:39   CT Angio Chest PE W/Cm &/Or Wo Cm Result Date: 10/26/2023 CLINICAL DATA:  Pulmonary embolism (PE) suspected, high prob EXAM: CT ANGIOGRAPHY CHEST WITH CONTRAST TECHNIQUE: Multidetector CT imaging of the chest was performed using the standard protocol during bolus administration of intravenous contrast. Multiplanar CT image reconstructions and MIPs were obtained to evaluate the vascular anatomy. RADIATION DOSE REDUCTION: This exam was performed according to the departmental dose-optimization program which includes automated exposure control, adjustment of the mA and/or kV according to patient size and/or use of iterative reconstruction technique. CONTRAST:  OMNIPAQUE  IOHEXOL  350 MG/ML SOLN COMPARISON:  05/16/2021 by report only FINDINGS: Cardiovascular: Heart size normal. No pericardial effusion. Dilated central pulmonary arteries suggesting pulmonary hypertension. Satisfactory opacification of pulmonary arteries noted, and there is no evidence of pulmonary emboli. Scattered coronary calcifications. Adequate contrast opacification of the thoracic  aorta with no evidence of dissection, aneurysm, or stenosis. There is classic 3-vessel brachiocephalic arch anatomy without proximal stenosis. Calcified plaque in the arch and descending thoracic aorta without displaced intimal calcifications. Mediastinum/Nodes: Enlarged 1.2 cm anterior mediastinal lymph node. No other adenopathy identified. Surgical clips left axilla. Lungs/Pleura: No pleural effusion. No pneumothorax. Somewhat geographic ground-glass opacities with a predominantly perihilar distribution, left worse than right. Upper Abdomen: 9 mm calcified gallstone. 8  mm calcified granuloma in the posterior right hepatic lobe. No acute findings. Musculoskeletal: Mild thoracolumbar scoliosis with spondylitic change near the thoracolumbar junction. No acute findings. Review of the MIP images confirms the above findings. IMPRESSION: 1. Negative for acute PE or thoracic aortic dissection. 2. Dilated central pulmonary arteries suggesting pulmonary hypertension. 3. Geographic ground-glass opacities with a predominantly perihilar distribution, left worse than right. 4. Cholelithiasis. 5. Coronary and aortic Atherosclerosis (ICD10-I70.0). Electronically Signed   By: Nicoletta Barrier M.D.   On: 10/26/2023 19:41   DG Chest 1 View Result Date: 10/26/2023 CLINICAL DATA:  [id number] Chest pain [id number] EXAM: CHEST  1 VIEW COMPARISON:  04/19/2023 FINDINGS: Mild alveolar opacities in the left lower lung. Right lung clear. No pneumothorax. Heart size and mediastinal contours are within normal limits. Aortic Atherosclerosis (ICD10-170.0). No effusion. Left axillary clips. IMPRESSION: Mild left lower lung opacities. Electronically Signed   By: Nicoletta Barrier M.D.   On: 10/26/2023 19:32    Family Communication: Discussed with patient, family at bedside.  The understand and agree. All questions answered.  Disposition: Status is: Observation The patient remains OBS appropriate and will d/c before 2 midnights.  Planned Discharge Destination: Home     Time spent: 37 minutes  Author: Aisha Hove, MD 10/28/2023 5:04 PM Secure chat 7am to 7pm For on call review [email or web address].

## 2023-10-28 NOTE — Consult Note (Signed)
 [facility] CLINIC CARDIOLOGY CONSULT NOTE       Patient ID: Michelle Hawkins MRN: [id number] DOB/AGE: [birth year] 79 y.o.  Admit date: 10/26/2023 Referring Physician Dr. Butch Cashing Primary Physician Rex Castor, MD Primary Cardiologist Dr. Parks Bollman Reason for Consultation Chest pain, Pulm HTN  HPI: Michelle Hawkins is a 78 y.o. female  with a past medical history of hypertension, hyperlipidemia, hx of palpitations, 40-pack year smoking history, hx of breast cancer s/p left breast lumpectomy who presented to the ED on 10/26/2023 for SOB and chest pain. Patient seen my Dr. Parks Bollman on 07/2023. Cardiology was consulted for further evaluation.   Patient presented to the ED with shortness of breath and chest pain. Patient states her chest pain radiates up to her throat and is exacerbated by deep breathing. Patient reports her chest pain occurred during her sleep and persisted all day Sunday. Work up in the ED notable for Na 126, K 4.0, Cr 0.82. Hgb 10.4, platelets 264. LE US  negative for DVT, CTA negative for pulmonary embolism. CTA also showed coronary and aortic atherosclerosis. CXR with mid-left lower lung opacities, no evidence of effusion. BNP elevated at 423.4. Trops negative x2. EKG in ED with sinus rhythm with PVCs, rate 67 bpm with no significant ST changes.   At the time of my evaluation this morning, patient was resting comfortably in hospital bed. Spoke about her symptoms in further. Patient reports the chest pain that she experiences on Sunday was new for her. Patient states when she walks up stairs she occasionally feels chest discomfort that radiates up to her neck that feels like a "flutter" feeling. Patient reports having intermittent chest discomfort for over one year. This AM patient states she still has some mild chest discomfort currently.   Review of systems complete and found to be negative unless listed above    Past Medical History:  Diagnosis Date   Allergic rhinitis     Anxiety    Breast cancer (HCC) 1996   s/p left breast lumpectomy (lymph node dissection - 2/11 positive), chemotherapy   Breast cancer (HCC) 1995   right breast lumpectomy   Complication of anesthesia    slow to wake   Depression    Diabetes mellitus    Dysphagia    Family history of adverse reaction to anesthesia    Mother - slow to wake   GERD (gastroesophageal reflux disease)    H/O ulcer disease    PUD   Hypercholesterolemia    Hypertension    Hypothyroidism    multinodular goiter   Nephrolithiasis    Personal history of chemotherapy    Personal history of radiation therapy    Scoliosis    Skin cancer    SVT (supraventricular tachycardia) (HCC)     Past Surgical History:  Procedure Laterality Date   ABDOMINAL HYSTERECTOMY     partial   BREAST BIOPSY Left 09/2012   benign   BREAST LUMPECTOMY Left 1996   lumpectomy with chemo and rad tx for breast ca (lymph node dissection - 2/11 positive   BREAST LUMPECTOMY Right 1995   lumpectomy only   BREAST LUMPECTOMY WITH AXILLARY LYMPH NODE DISSECTION  1996   left   CATARACT EXTRACTION W/PHACO Left 11/29/2020   Procedure: CATARACT EXTRACTION PHACO AND INTRAOCULAR LENS PLACEMENT (IOC) LEFT DIABETIC VIVITY TORIC;  Surgeon: Clair Crews, MD;  Location: [facility];  Service: Ophthalmology;  Laterality: Left;  Diabetic - oral meds 6.88 00:40.7   CATARACT EXTRACTION W/PHACO Right 12/13/2020   Procedure:  CATARACT EXTRACTION PHACO AND INTRAOCULAR LENS PLACEMENT (IOC) RIGHT DIABETIC VIVITY TORIC 5.44 00:40.3;  Surgeon: Clair Crews, MD;  Location: [facility];  Service: Ophthalmology;  Laterality: Right;  Diabetic - oral meds   COLONOSCOPY WITH PROPOFOL  N/A 06/03/2019   Procedure: COLONOSCOPY WITH PROPOFOL ;  Surgeon: Marshall Skeeter, MD;  Location: [facility];  Service: Endoscopy;  Laterality: N/A;   ESOPHAGOGASTRODUODENOSCOPY N/A 06/03/2019   Procedure: ESOPHAGOGASTRODUODENOSCOPY (EGD);  Surgeon: Marshall Skeeter, MD;  Location: [facility];  Service: Endoscopy;  Laterality: N/A;   LUMBAR LAMINECTOMY  3/07   L5-S1    Medications Prior to Admission  Medication Sig Dispense Refill Last Dose/Taking   acetaminophen  (TYLENOL ) 500 MG tablet Take 500 mg by mouth every 6 (six) hours as needed for mild pain (pain score 1-3).   Taking As Needed   aspirin  81 MG tablet Take 81 mg by mouth 2 (two) times daily.    10/26/2023   atorvastatin  (LIPITOR) 20 MG tablet TAKE ONE TABLET EVERY DAY 90 tablet 2 10/26/2023   Biotin 5 MG CAPS Take by mouth.   10/26/2023   calcium  citrate (CALCITRATE - DOSED IN MG ELEMENTAL CALCIUM ) 950 MG tablet Take 200 mg of elemental calcium  by mouth daily.   10/26/2023   cholecalciferol (VITAMIN D) 400 UNITS TABS Take by mouth.   10/26/2023   escitalopram  (LEXAPRO ) 20 MG tablet TAKE ONE TABLET EVERY DAY 90 tablet 1 10/26/2023   ferrous sulfate 325 (65 FE) MG EC tablet Take 325 mg by mouth daily with breakfast.   10/26/2023   fish oil-omega-3 fatty acids 1000 MG capsule Take 2 g by mouth daily.   10/26/2023   levothyroxine  (SYNTHROID ) 25 MCG tablet TAKE 1 TABLET EVERY DAY ON EMPTY STOMACHWITH A GLASS OF WATER AT LEAST 30-60 MINBEFORE BREAKFAST 90 tablet 1 10/26/2023   losartan  (COZAAR ) 100 MG tablet TAKE 1 TABLET BY MOUTH DAILY 90 tablet 1 10/26/2023   metFORMIN  (GLUCOPHAGE -XR) 500 MG 24 hr tablet TAKE TWO TABLETS TWICE DAILY (Patient taking differently: Take 500-1,000 mg by mouth 2 (two) times daily. 500mg  qam and 1,000mg  qpm) 360 tablet 3 10/26/2023   metoprolol  succinate (TOPROL -XL) 50 MG 24 hr tablet TAKE 1 TABLET BY MOUTH DAILY WITH OR FOLLOWING A MEAL 90 tablet 1 10/26/2023   Multiple Vitamin (MULTIVITAMIN WITH MINERALS) TABS tablet Take 1 tablet by mouth daily.   10/26/2023   omeprazole  (PRILOSEC) 40 MG capsule TAKE 1 CAPSULE EVERY DAY 90 capsule 1 10/26/2023   Probiotic Product (PROBIOTIC DAILY PO) Take 1 tablet by mouth.   10/26/2023   TURMERIC PO Take by mouth daily.    10/26/2023    Accu-Chek Softclix Lancets lancets Check blood sugar twice daily Dx 250.00 200 each 1    Blood Glucose Monitoring Suppl (ONE TOUCH ULTRA SYSTEM KIT) w/Device KIT 1 kit by Does not apply route once.      Continuous Blood Gluc Receiver (FREESTYLE LIBRE 14 DAY READER) DEVI Inject 1 Device into the skin every 14 (fourteen) days. Place 1 sensor every 14 days. Use to check sugar at least 4 times daily 6 each 3    Continuous Blood Gluc Sensor (FREESTYLE LIBRE 14 DAY SENSOR) MISC Inject 1 Device into the skin every 14 (fourteen) days. 6 each 3    glucose blood (ONE TOUCH ULTRA TEST) test strip CHECK BLOOD SUGAR UP TO THREE TIMES DAILY Dx E11.9 300 each 3    Social History   Socioeconomic History   Marital status: Widowed  Spouse name: Not on file   Number of children: Not on file   Years of education: Not on file   Highest education level: Not on file  Occupational History   Not on file  Tobacco Use   Smoking status: Former    Current packs/day: 0.00    Average packs/day: 0.5 packs/day for 40.0 years (20.0 ttl pk-yrs)    Types: Cigarettes    Start date: 04/14/1964    Quit date: 04/14/2004    Years since quitting: 19.5   Smokeless tobacco: Never  Vaping Use   Vaping status: Never Used  Substance and Sexual Activity   Alcohol use: No    Alcohol/week: 0.0 standard drinks of alcohol   Drug use: No   Sexual activity: Never  Other Topics Concern   Not on file  Social History Narrative   Not on file   Social Drivers of Health   Financial Resource Strain: Low Risk  (04/25/2023)   Received from [facility]   Overall Financial Resource Strain (CARDIA)    Difficulty of Paying Living Expenses: Not hard at all  Food Insecurity: No Food Insecurity (10/26/2023)   Hunger Vital Sign    Worried About Running Out of Food in the Last Year: Never true    Ran Out of Food in the Last Year: Never true  Transportation Needs: No Transportation Needs (10/26/2023)   PRAPARE -  Administrator, Civil Service (Medical): No    Lack of Transportation (Non-Medical): No  Physical Activity: Not on file  Stress: Not on file  Social Connections: Moderately Integrated (10/26/2023)   Social Connection and Isolation Panel [NHANES]    Frequency of Communication with Friends and Family: More than three times a week    Frequency of Social Gatherings with Friends and Family: More than three times a week    Attends Religious Services: More than 4 times per year    Active Member of [employer] or Organizations: Yes    Attends Banker Meetings: More than 4 times per year    Marital Status: Widowed  Intimate Partner Violence: Not At Risk (10/26/2023)   Humiliation, Afraid, Rape, and Kick questionnaire    Fear of Current or Ex-Partner: No    Emotionally Abused: No    Physically Abused: No    Sexually Abused: No    Family History  Problem Relation Age of Onset   Heart disease Father    Hypertension Father    Diabetes Father    Alzheimer's disease Mother    Diabetes Sister    Diabetes Sister    Diabetes Sister    Diabetes Sister      Vitals:   10/27/23 1929 10/28/23 0435 10/28/23 0732 10/28/23 0734  BP: (!) 124/54 128/68 (!) 143/51 (!) 143/60  Pulse: 66 60 60 (!) 58  Resp: 18 18 16    Temp: 98.6 F (37 C) 98.3 F (36.8 C) 98.3 F (36.8 C)   TempSrc:  Oral    SpO2: 94% 95% 93% 96%  Weight:      Height:        PHYSICAL EXAM General: well appearing elderly female, well nourished, in no acute distress. HEENT: Normocephalic and atraumatic. Neck: No JVD.   Lungs: Normal respiratory effort on room air. Clear bilaterally to auscultation. No wheezes, crackles, rhonchi.  Heart: HRRR. Normal S1 and S2 without gallops or murmurs.  Abdomen: Non-distended appearing.  Msk: Normal strength and tone for age. Extremities: Warm and well perfused.  No clubbing, cyanosis. No edema.  Neuro: Alert and oriented X 3. Psych: Answers questions appropriately.    Labs: Basic Metabolic Panel: Recent Labs    10/26/23 1828 10/26/23 2033 10/27/23 0449  NA 126*  --  130*  K 4.0  --  3.9  CL 97*  --  102  CO2 21*  --  23  GLUCOSE 166*  --  189*  BUN 21  --  20  CREATININE 0.82 0.83 0.87  CALCIUM  9.0  --  8.8*   Liver Function Tests: Recent Labs    10/26/23 1828  AST 28  ALT 21  ALKPHOS 61  BILITOT 0.9  PROT 7.1  ALBUMIN 3.4*   No results for input(s): "LIPASE", "AMYLASE" in the last 72 hours. CBC: Recent Labs    10/26/23 2033 10/27/23 0449  WBC 13.7* 13.8*  HGB 9.7* 9.6*  HCT 30.0* 29.6*  MCV 85.2 85.3  PLT 316 PLATELET CLUMPS NOTED ON SMEAR, UNABLE TO ESTIMATE   Cardiac Enzymes: Recent Labs    10/26/23 1828 10/26/23 1956  TROPONINIHS 7 6   BNP: Recent Labs    10/26/23 1828  BNP 423.4*   D-Dimer: No results for input(s): "DDIMER" in the last 72 hours. Hemoglobin A1C: Recent Labs    10/26/23 2033  HGBA1C 6.8*   Fasting Lipid Panel: No results for input(s): "CHOL", "HDL", "LDLCALC", "TRIG", "CHOLHDL", "LDLDIRECT" in the last 72 hours. Thyroid  Function Tests: No results for input(s): "TSH", "T4TOTAL", "T3FREE", "THYROIDAB" in the last 72 hours.  Invalid input(s): "FREET3" Anemia Panel: No results for input(s): "VITAMINB12", "FOLATE", "FERRITIN", "TIBC", "IRON", "RETICCTPCT" in the last 72 hours.   Radiology: US  Venous Img Lower Bilateral (DVT) Result Date: 10/26/2023 CLINICAL DATA:  [id number] with shortness of breath. EXAM: BILATERAL LOWER EXTREMITY VENOUS DOPPLER ULTRASOUND TECHNIQUE: Gray-scale sonography with graded compression, as well as color Doppler and duplex ultrasound were performed to evaluate the lower extremity deep venous systems from the level of the common femoral vein and including the common femoral, femoral, profunda femoral, popliteal and calf veins including the posterior tibial, peroneal and gastrocnemius veins when visible. The superficial great saphenous vein was also interrogated. Spectral  Doppler was utilized to evaluate flow at rest and with distal augmentation maneuvers in the common femoral, femoral and popliteal veins. COMPARISON:  None Available. FINDINGS: RIGHT LOWER EXTREMITY Common Femoral Vein: No evidence of thrombus. Normal compressibility, respiratory phasicity and response to augmentation. Saphenofemoral Junction: No evidence of thrombus. Normal compressibility and flow on color Doppler imaging. Profunda Femoral Vein: No evidence of thrombus. Normal compressibility and flow on color Doppler imaging. Femoral Vein: No evidence of thrombus. Normal compressibility, respiratory phasicity and response to augmentation. Popliteal Vein: No evidence of thrombus. Normal compressibility, respiratory phasicity and response to augmentation. Calf Veins: No evidence of thrombus. Normal compressibility and flow on color Doppler imaging. Superficial Great Saphenous Vein: No evidence of thrombus. Normal compressibility. Venous Reflux:  None. Other Findings:  Pulsatile venous waveforms. LEFT LOWER EXTREMITY Common Femoral Vein: No evidence of thrombus. Normal compressibility, respiratory phasicity and response to augmentation. Saphenofemoral Junction: No evidence of thrombus. Normal compressibility and flow on color Doppler imaging. Profunda Femoral Vein: No evidence of thrombus. Normal compressibility and flow on color Doppler imaging. Femoral Vein: No evidence of thrombus. Normal compressibility, respiratory phasicity and response to augmentation. Popliteal Vein: No evidence of thrombus. Normal compressibility, respiratory phasicity and response to augmentation. Calf Veins: No evidence of thrombus. Normal compressibility and flow on color Doppler imaging. Superficial Great Saphenous Vein: No  evidence of thrombus. Normal compressibility. Venous Reflux:  None. Other Findings:  Pulsatile venous waveforms. IMPRESSION: 1. No evidence of deep venous thrombosis in either lower extremity. 2. Pulsatile venous  waveforms bilaterally. This may be seen with right heart dysfunction and tricuspid regurgitation. Electronically Signed   By: Denman Fischer M.D.   On: 10/26/2023 21:39   CT Angio Chest PE W/Cm &/Or Wo Cm Result Date: 10/26/2023 CLINICAL DATA:  Pulmonary embolism (PE) suspected, high prob EXAM: CT ANGIOGRAPHY CHEST WITH CONTRAST TECHNIQUE: Multidetector CT imaging of the chest was performed using the standard protocol during bolus administration of intravenous contrast. Multiplanar CT image reconstructions and MIPs were obtained to evaluate the vascular anatomy. RADIATION DOSE REDUCTION: This exam was performed according to the departmental dose-optimization program which includes automated exposure control, adjustment of the mA and/or kV according to patient size and/or use of iterative reconstruction technique. CONTRAST:  OMNIPAQUE  IOHEXOL  350 MG/ML SOLN COMPARISON:  05/16/2021 by report only FINDINGS: Cardiovascular: Heart size normal. No pericardial effusion. Dilated central pulmonary arteries suggesting pulmonary hypertension. Satisfactory opacification of pulmonary arteries noted, and there is no evidence of pulmonary emboli. Scattered coronary calcifications. Adequate contrast opacification of the thoracic aorta with no evidence of dissection, aneurysm, or stenosis. There is classic 3-vessel brachiocephalic arch anatomy without proximal stenosis. Calcified plaque in the arch and descending thoracic aorta without displaced intimal calcifications. Mediastinum/Nodes: Enlarged 1.2 cm anterior mediastinal lymph node. No other adenopathy identified. Surgical clips left axilla. Lungs/Pleura: No pleural effusion. No pneumothorax. Somewhat geographic ground-glass opacities with a predominantly perihilar distribution, left worse than right. Upper Abdomen: 9 mm calcified gallstone. 8 mm calcified granuloma in the posterior right hepatic lobe. No acute findings. Musculoskeletal: Mild thoracolumbar scoliosis with  spondylitic change near the thoracolumbar junction. No acute findings. Review of the MIP images confirms the above findings. IMPRESSION: 1. Negative for acute PE or thoracic aortic dissection. 2. Dilated central pulmonary arteries suggesting pulmonary hypertension. 3. Geographic ground-glass opacities with a predominantly perihilar distribution, left worse than right. 4. Cholelithiasis. 5. Coronary and aortic Atherosclerosis (ICD10-I70.0). Electronically Signed   By: Nicoletta Barrier M.D.   On: 10/26/2023 19:41   DG Chest 1 View Result Date: 10/26/2023 CLINICAL DATA:  [id number] Chest pain [id number] EXAM: CHEST  1 VIEW COMPARISON:  04/19/2023 FINDINGS: Mild alveolar opacities in the left lower lung. Right lung clear. No pneumothorax. Heart size and mediastinal contours are within normal limits. Aortic Atherosclerosis (ICD10-170.0). No effusion. Left axillary clips. IMPRESSION: Mild left lower lung opacities. Electronically Signed   By: Nicoletta Barrier M.D.   On: 10/26/2023 19:32   Stress Echo: 04/24/2018 - normal left ventricular function of LVEF greater than 55% at rest and at peak exercise. There was mild tricuspid regurgitation   Holter Monitor (05/09 - 11/17/2021): Revealed predominant sinus bradycardia with a mean heart rate of 57 bpm, sinus heart rate ranging from 50-80 bpm. There were occasional premature atrial contractions and rare premature ventricular contractions present. There were 7 atrial runs, the longest lasting 9 beats.  ECHO pending  TELEMETRY reviewed by me 10/28/2023: Not on tele at time of evaluation.  EKG reviewed by me: Sinus rhythm with PVCs, rate 67 bpm  Data reviewed by me 10/28/2023: last 24h vitals tele labs imaging I/O ED provider note, admission H&P.  Principal Problem:   SOB (shortness of breath) Active Problems:   Chest pain   Hypoxia    ASSESSMENT AND PLAN:  RACHYL WUEBKER is a 78 y.o. female  with a past medical  history of hypertension, hyperlipidemia, hx of palpitations,  40-pack year smoking history, hx of breast cancer s/p left breast lumpectomy who presented to the ED on 10/26/2023 for SOB and chest pain. Patient seen my Dr. Parks Bollman on 07/2023. Cardiology was consulted for further evaluation.  # Hypertension # Smoking History # Chest pain Patient reported to the ED due to worsening chest discomfort that radiated to her neck that lasted all day. Patient reports having intermittently chest pain for the past year and has a "flutter" feeling in her chest when she walks up stairs. In ED workup, CTA showed coronary and aortic atherosclerosis. BNP elevated at 423.4. Trops negative x2. EKG in ED with sinus rhythm with PVCs, rate 67 bpm with no significant ST changes. This AM, BP is elevated and HR stable -Due to patients multiple risk factors for coronary artery disease and intermittent chest pain for over 1 year. Ordered Myoview  for further ischemic evaluation. Further recommendations will be given after resulted stress test. -Continue ASA 81 mg and atorvastatin  20 mg daily -Resume home losartan  100 mg daily -Resume home metoprolol  succinate 50 mg daily after Myoview  performed Consider outpatient CTA of coronaries for evaluation arteriosclerotic vascular disease   Signed: Antonette Batters, MD  10/28/2023, 11:53 AM [facility] Cardiology

## 2023-10-28 NOTE — CV Procedure (Signed)
 Brief Lexiscan  post note  Patient with Lexiscan  Myoview  for chest pain possible angina  Study was unremarkable for any evidence of ischemia  LV function appears to be preserved at least 55%  Echo is still pending  Would recommend conservative medical therapy hopefully discharge home soon  Have the patient follow-up in cardiology 1 to 2 weeks  Consider outpatient CTA with FFR  Arlon Bergamo MD

## 2023-10-28 NOTE — Care Management Obs Status (Signed)
 MEDICARE OBSERVATION STATUS NOTIFICATION   Patient Details  Name: Michelle Hawkins MRN: [id number] Date of Birth: Aug 08, 1945   Medicare Observation Status Notification Given:       Anise Kerns 10/28/2023, 12:57 PM

## 2023-10-29 ENCOUNTER — Other Ambulatory Visit: Payer: Self-pay | Admitting: Student

## 2023-10-29 DIAGNOSIS — R0602 Shortness of breath: Secondary | ICD-10-CM | POA: Diagnosis not present

## 2023-10-29 DIAGNOSIS — I2510 Atherosclerotic heart disease of native coronary artery without angina pectoris: Secondary | ICD-10-CM

## 2023-10-29 DIAGNOSIS — I2089 Other forms of angina pectoris: Secondary | ICD-10-CM

## 2023-10-29 DIAGNOSIS — E1165 Type 2 diabetes mellitus with hyperglycemia: Secondary | ICD-10-CM

## 2023-10-29 DIAGNOSIS — R0789 Other chest pain: Secondary | ICD-10-CM | POA: Diagnosis not present

## 2023-10-29 DIAGNOSIS — I272 Pulmonary hypertension, unspecified: Secondary | ICD-10-CM | POA: Diagnosis not present

## 2023-10-29 DIAGNOSIS — I10 Essential (primary) hypertension: Secondary | ICD-10-CM

## 2023-10-29 LAB — BASIC METABOLIC PANEL WITH GFR
Anion gap: 7 (ref 5–15)
BUN: 15 mg/dL (ref 8–23)
CO2: 21 mmol/L — ABNORMAL LOW (ref 22–32)
Calcium: 9.1 mg/dL (ref 8.9–10.3)
Chloride: 103 mmol/L (ref 98–111)
Creatinine, Ser: 0.84 mg/dL (ref 0.44–1.00)
GFR, Estimated: >60 mL/min (ref >60)
Glucose, Bld: 143 mg/dL — ABNORMAL HIGH (ref 70–99)
Potassium: 3.8 mmol/L (ref 3.5–5.1)
Sodium: 131 mmol/L — ABNORMAL LOW (ref 135–145)

## 2023-10-29 LAB — CBC
HCT: 32.9 % — ABNORMAL LOW (ref 36.0–46.0)
Hemoglobin: 10.6 g/dL — ABNORMAL LOW (ref 12.0–15.0)
MCH: 27.4 pg (ref 26.0–34.0)
MCHC: 32.2 g/dL (ref 30.0–36.0)
MCV: 85 fL (ref 80.0–100.0)
Platelets: 255 10*3/uL (ref 150–400)
RBC: 3.87 MIL/uL (ref 3.87–5.11)
RDW: 14.3 % (ref 11.5–15.5)
WBC: 8.3 10*3/uL (ref 4.0–10.5)
nRBC: 0 % (ref 0.0–0.2)

## 2023-10-29 LAB — GLUCOSE, CAPILLARY
Glucose-Capillary: 140 mg/dL — ABNORMAL HIGH (ref 70–99)
Glucose-Capillary: 173 mg/dL — ABNORMAL HIGH (ref 70–99)

## 2023-10-29 LAB — VITAMIN B12: Vitamin B-12: 431 pg/mL (ref 180–914)

## 2023-10-29 MED ORDER — ISOSORBIDE MONONITRATE ER 30 MG PO TB24
30.0000 mg | ORAL_TABLET | Freq: Every day | ORAL | Status: DC
  Administered 2023-10-29: 30 mg via ORAL
  Filled 2023-10-29: qty 1

## 2023-10-29 MED ORDER — ISOSORBIDE MONONITRATE ER 30 MG PO TB24: 30.0000 mg | ORAL_TABLET | Freq: Every day | ORAL | 2 refills | Status: AC

## 2023-10-29 NOTE — Progress Notes (Signed)
 Discharge instructions reviewed with patient. IV was removed. Questions encouraged and answered. Belongings were collected by patient.

## 2023-10-29 NOTE — TOC CM/SW Note (Signed)
 Transition of Care [facility]) - Inpatient Brief Assessment   Patient Details  Name: Michelle Hawkins MRN: [id number] Date of Birth: February 20, 1946  Transition of Care [facility]) CM/SW Contact:    Loman Risk, RN Phone Number: 10/29/2023, 10:20 AM   Clinical Narrative:  Transition of Care [facility]) Screening Note   Patient Details  Name: [name] Date of Birth: 09-Jun-1946   Transition of Care [facility]) CM/SW Contact:    Loman Risk, RN Phone Number: 10/29/2023, 10:20 AM    Transition of Care Department [facility]) has reviewed patient and no TOC needs have been identified at this time.. If new patient transition needs arise, please place a TOC consult.     Transition of Care Asessment: Insurance and Status: Insurance coverage has been reviewed Patient has primary care physician: Yes     Prior/Current Home Services: No current home services Social Drivers of Health Review: SDOH reviewed no interventions necessary Readmission risk has been reviewed: Yes Transition of care needs: no transition of care needs at this time

## 2023-10-29 NOTE — Evaluation (Signed)
 Physical Therapy Evaluation Patient Details Name: Michelle Hawkins MRN: [id number] DOB: Feb 06, 1946 Today's Date: 10/29/2023  History of Present Illness  Pt is a 78 y.o. female  with a past medical history of hypertension, hyperlipidemia, hx of palpitations, 40-pack year smoking history, hx of breast cancer s/p left breast lumpectomy who presented to the ED on 10/26/2023 for SOB and chest pain, workup for hypoxia, pulmonary HTN, hyponatremia.   Clinical Impression  Pt A&Ox4, reported mild tightness in neck/throat with a deep breath, but no chest pain and stated much improved since admission. Per pt at baseline she is independent, lives alone. She was able to demonstrate bed mobility and ambulation modI I (decreased velocity, AD). Sit <> stand with handheld assist per pt request, but not required. DGI score of 19 indicated risk of falls, main factor was pt's decreased gait velocity. Pt denied any recent falls in the last 6 months and no current concerns about her balance. The patient demonstrated and reported return to baseline level of functioning, no further acute PT needs indicated. PT to sign off. Please reconsult PT if pt status changes or acute needs are identified.          If plan is discharge home, recommend the following: Other (comment) (NA)   Can travel by private vehicle        Equipment Recommendations None recommended by PT  Recommendations for Other Services       Functional Status Assessment Patient has not had a recent decline in their functional status     Precautions / Restrictions Precautions Precautions: None Restrictions Weight Bearing Restrictions Per Provider Order: No      Mobility  Bed Mobility Overal bed mobility: Modified Independent                  Transfers Overall transfer level: Needs assistance   Transfers: Sit to/from Stand Sit to Stand: Contact guard assist           General transfer comment: pt reached for handheld assist,  provided but not required    Ambulation/Gait Ambulation/Gait assistance: Modified independent (Device/Increase time) Gait Distance (Feet): 220 Feet Assistive device: None   Gait velocity: decreased        Stairs            Wheelchair Mobility     Tilt Bed    Modified Rankin (Stroke Patients Only)       Balance Overall balance assessment: Modified Independent Sitting-balance support: Feet supported Sitting balance-Leahy Scale: Normal     Standing balance support: No upper extremity supported Standing balance-Leahy Scale: Good                   Standardized Balance Assessment Standardized Balance Assessment : Dynamic Gait Index   Dynamic Gait Index Level Surface: Normal Change in Gait Speed: Mild Impairment Gait with Horizontal Head Turns: Mild Impairment Gait with Vertical Head Turns: Mild Impairment Gait and Pivot Turn: Normal Step Over Obstacle: Mild Impairment Step Around Obstacles: Normal Steps: Mild Impairment (use of rail) Total Score: 19       Pertinent Vitals/Pain Pain Assessment Pain Assessment: Faces Faces Pain Scale: Hurts a little bit Pain Location: pain near her upper chest/throat when she takes a deep breath Pain Descriptors / Indicators: Tightness Pain Intervention(s): Monitored during session    Home Living Family/patient expects to be discharged to:: Private residence Living Arrangements: Alone Available Help at Discharge: Family;Available PRN/intermittently Type of Home: House Home Access: Stairs to enter   Entrance  Stairs-Number of Steps: 1   Home Layout: Multi-level;Able to live on main level with bedroom/bathroom Home Equipment: Jeananne Mighty - single point      Prior Function Prior Level of Function : Independent/Modified Independent                     Extremity/Trunk Assessment   Upper Extremity Assessment Upper Extremity Assessment: Overall WFL for tasks assessed    Lower Extremity Assessment Lower  Extremity Assessment: Overall WFL for tasks assessed       Communication   Communication Communication: No apparent difficulties    Cognition Arousal: Alert Behavior During Therapy: WFL for tasks assessed/performed   PT - Cognitive impairments: No apparent impairments                                 Cueing       General Comments General comments (skin integrity, edema, etc.): vss throughout    Exercises     Assessment/Plan    PT Assessment Patient does not need any further PT services  PT Problem List         PT Treatment Interventions      PT Goals (Current goals can be found in the Care Plan section)       Frequency       Co-evaluation               AM-PAC PT "6 Clicks" Mobility  Outcome Measure Help needed turning from your back to your side while in a flat bed without using bedrails?: None Help needed moving from lying on your back to sitting on the side of a flat bed without using bedrails?: None Help needed moving to and from a bed to a chair (including a wheelchair)?: None Help needed standing up from a chair using your arms (e.g., wheelchair or bedside chair)?: None Help needed to walk in hospital room?: None Help needed climbing 3-5 steps with a railing? : None 6 Click Score: 24    End of Session   Activity Tolerance: Patient tolerated treatment well Patient left: Other (comment) (with OT in hallway)   PT Visit Diagnosis: Other abnormalities of gait and mobility (R26.89)    Time: 0981-1914 PT Time Calculation (min) (ACUTE ONLY): 8 min   Charges:   PT Evaluation $PT Eval Low Complexity: 1 Low   PT General Charges $$ ACUTE PT VISIT: 1 Visit        Darien Eden PT, DPT 9:52 AM,10/29/23

## 2023-10-29 NOTE — Discharge Summary (Signed)
 Physician Discharge Summary   Patient: Michelle Hawkins MRN: [id number] DOB: 14-Jan-1946  Admit date:     10/26/2023  Discharge date: {dischdate:26783}  Discharge Physician: Aisha Hove   PCP: Rex Castor, MD   Recommendations at discharge:  {Tip this will not be part of the note when signed- Example include specific recommendations for outpatient follow-up, pending tests to follow-up on. (Optional):26781}  ***  Discharge Diagnoses: Principal Problem:   SOB (shortness of breath) Active Problems:   Chest pain   Hypoxia  Resolved Problems:   * No resolved hospital problems. [facility] Course: No notes on file  Assessment and Plan: No notes have been filed under this hospital service. Service: Hospitalist     {Tip this will not be part of the note when signed Body mass index is 23.35 kg/m. , ,  (Optional):26781}  {(NOTE) Pain control PDMP Statment (Optional):26782} Consultants: *** Procedures performed: ***  Disposition: {Plan; Disposition:26390} Diet recommendation:  Discharge Diet Orders (From admission, onward)     Start     Ordered   10/29/23 0000  Diet - low sodium heart healthy        10/29/23 0943   10/29/23 0000  Diet Carb Modified        10/29/23 0943           {Diet_Plan:26776} DISCHARGE MEDICATION: Allergies as of 10/29/2023   No Known Allergies      Medication List     TAKE these medications    Accu-Chek Softclix Lancets lancets Check blood sugar twice daily Dx 250.00   acetaminophen  500 MG tablet Commonly known as: TYLENOL  Take 500 mg by mouth every 6 (six) hours as needed for mild pain (pain score 1-3).   aspirin  81 MG tablet Take 81 mg by mouth 2 (two) times daily.   atorvastatin  20 MG tablet Commonly known as: LIPITOR TAKE ONE TABLET EVERY DAY   Biotin 5 MG Caps Take by mouth.   calcium  citrate 950 (200 Ca) MG tablet Commonly known as: CALCITRATE - dosed in mg elemental calcium  Take 200 mg of elemental  calcium  by mouth daily.   cholecalciferol 10 MCG (400 UNIT) Tabs tablet Commonly known as: VITAMIN D3 Take by mouth.   escitalopram  20 MG tablet Commonly known as: LEXAPRO  TAKE ONE TABLET EVERY DAY   ferrous sulfate 325 (65 FE) MG EC tablet Take 325 mg by mouth daily with breakfast.   fish oil-omega-3 fatty acids 1000 MG capsule Take 2 g by mouth daily.   FreeStyle Libre 14 Day Reader Seymour Dapper Inject 1 Device into the skin every 14 (fourteen) days. Place 1 sensor every 14 days. Use to check sugar at least 4 times daily   FreeStyle Libre 14 Day Sensor Misc Inject 1 Device into the skin every 14 (fourteen) days.   glucose blood test strip Commonly known as: ONE TOUCH ULTRA TEST CHECK BLOOD SUGAR UP TO THREE TIMES DAILY Dx E11.9   isosorbide  mononitrate 30 MG 24 hr tablet Commonly known as: IMDUR  Take 1 tablet (30 mg total) by mouth daily. Start taking on: October 30, 2023   levothyroxine  25 MCG tablet Commonly known as: SYNTHROID  TAKE 1 TABLET EVERY DAY ON EMPTY STOMACHWITH A GLASS OF WATER AT LEAST 30-60 MINBEFORE BREAKFAST   losartan  100 MG tablet Commonly known as: COZAAR  TAKE 1 TABLET BY MOUTH DAILY   metFORMIN  500 MG 24 hr tablet Commonly known as: GLUCOPHAGE -XR TAKE TWO TABLETS TWICE DAILY What changed: See the new instructions.   metoprolol  succinate  50 MG 24 hr tablet Commonly known as: TOPROL -XL TAKE 1 TABLET BY MOUTH DAILY WITH OR FOLLOWING A MEAL   multivitamin with minerals Tabs tablet Take 1 tablet by mouth daily.   omeprazole  40 MG capsule Commonly known as: PRILOSEC TAKE 1 CAPSULE EVERY DAY   ONE TOUCH ULTRA SYSTEM KIT w/Device Kit 1 kit by Does not apply route once.   PROBIOTIC DAILY PO Take 1 tablet by mouth.   TURMERIC PO Take by mouth daily.        Follow-up Information     Paraschos, Alexander, MD. Go in 1 week(s).   Specialty: Cardiology Contact information: [address] [facility] [place]  [address] [phone]                Discharge Exam: Michelle Hawkins   10/26/23 1900  Weight: 61.7 kg   ***  Condition at discharge: {DC Condition:26389}  The results of significant diagnostics from this hospitalization (including imaging, microbiology, ancillary and laboratory) are listed below for reference.   Imaging Studies: US  Venous Img Lower Bilateral (DVT) Result Date: 10/26/2023 CLINICAL DATA:  [id number] with shortness of breath. EXAM: BILATERAL LOWER EXTREMITY VENOUS DOPPLER ULTRASOUND TECHNIQUE: Gray-scale sonography with graded compression, as well as color Doppler and duplex ultrasound were performed to evaluate the lower extremity deep venous systems from the level of the common femoral vein and including the common femoral, femoral, profunda femoral, popliteal and calf veins including the posterior tibial, peroneal and gastrocnemius veins when visible. The superficial great saphenous vein was also interrogated. Spectral Doppler was utilized to evaluate flow at rest and with distal augmentation maneuvers in the common femoral, femoral and popliteal veins. COMPARISON:  None Available. FINDINGS: RIGHT LOWER EXTREMITY Common Femoral Vein: No evidence of thrombus. Normal compressibility, respiratory phasicity and response to augmentation. Saphenofemoral Junction: No evidence of thrombus. Normal compressibility and flow on color Doppler imaging. Profunda Femoral Vein: No evidence of thrombus. Normal compressibility and flow on color Doppler imaging. Femoral Vein: No evidence of thrombus. Normal compressibility, respiratory phasicity and response to augmentation. Popliteal Vein: No evidence of thrombus. Normal compressibility, respiratory phasicity and response to augmentation. Calf Veins: No evidence of thrombus. Normal compressibility and flow on color Doppler imaging. Superficial Great Saphenous Vein: No evidence of thrombus. Normal compressibility. Venous Reflux:  None. Other Findings:   Pulsatile venous waveforms. LEFT LOWER EXTREMITY Common Femoral Vein: No evidence of thrombus. Normal compressibility, respiratory phasicity and response to augmentation. Saphenofemoral Junction: No evidence of thrombus. Normal compressibility and flow on color Doppler imaging. Profunda Femoral Vein: No evidence of thrombus. Normal compressibility and flow on color Doppler imaging. Femoral Vein: No evidence of thrombus. Normal compressibility, respiratory phasicity and response to augmentation. Popliteal Vein: No evidence of thrombus. Normal compressibility, respiratory phasicity and response to augmentation. Calf Veins: No evidence of thrombus. Normal compressibility and flow on color Doppler imaging. Superficial Great Saphenous Vein: No evidence of thrombus. Normal compressibility. Venous Reflux:  None. Other Findings:  Pulsatile venous waveforms. IMPRESSION: 1. No evidence of deep venous thrombosis in either lower extremity. 2. Pulsatile venous waveforms bilaterally. This may be seen with right heart dysfunction and tricuspid regurgitation. Electronically Signed   By: Denman Fischer M.D.   On: 10/26/2023 21:39   CT Angio Chest PE W/Cm &/Or Wo Cm Result Date: 10/26/2023 CLINICAL DATA:  Pulmonary embolism (PE) suspected, high prob EXAM: CT ANGIOGRAPHY CHEST WITH CONTRAST TECHNIQUE: Multidetector CT imaging of the chest was performed using the standard protocol during bolus administration of  intravenous contrast. Multiplanar CT image reconstructions and MIPs were obtained to evaluate the vascular anatomy. RADIATION DOSE REDUCTION: This exam was performed according to the departmental dose-optimization program which includes automated exposure control, adjustment of the mA and/or kV according to patient size and/or use of iterative reconstruction technique. CONTRAST:  OMNIPAQUE  IOHEXOL  350 MG/ML SOLN COMPARISON:  05/16/2021 by report only FINDINGS: Cardiovascular: Heart size normal. No pericardial effusion.  Dilated central pulmonary arteries suggesting pulmonary hypertension. Satisfactory opacification of pulmonary arteries noted, and there is no evidence of pulmonary emboli. Scattered coronary calcifications. Adequate contrast opacification of the thoracic aorta with no evidence of dissection, aneurysm, or stenosis. There is classic 3-vessel brachiocephalic arch anatomy without proximal stenosis. Calcified plaque in the arch and descending thoracic aorta without displaced intimal calcifications. Mediastinum/Nodes: Enlarged 1.2 cm anterior mediastinal lymph node. No other adenopathy identified. Surgical clips left axilla. Lungs/Pleura: No pleural effusion. No pneumothorax. Somewhat geographic ground-glass opacities with a predominantly perihilar distribution, left worse than right. Upper Abdomen: 9 mm calcified gallstone. 8 mm calcified granuloma in the posterior right hepatic lobe. No acute findings. Musculoskeletal: Mild thoracolumbar scoliosis with spondylitic change near the thoracolumbar junction. No acute findings. Review of the MIP images confirms the above findings. IMPRESSION: 1. Negative for acute PE or thoracic aortic dissection. 2. Dilated central pulmonary arteries suggesting pulmonary hypertension. 3. Geographic ground-glass opacities with a predominantly perihilar distribution, left worse than right. 4. Cholelithiasis. 5. Coronary and aortic Atherosclerosis (ICD10-I70.0). Electronically Signed   By: Nicoletta Barrier M.D.   On: 10/26/2023 19:41   DG Chest 1 View Result Date: 10/26/2023 CLINICAL DATA:  [id number] Chest pain [id number] EXAM: CHEST  1 VIEW COMPARISON:  04/19/2023 FINDINGS: Mild alveolar opacities in the left lower lung. Right lung clear. No pneumothorax. Heart size and mediastinal contours are within normal limits. Aortic Atherosclerosis (ICD10-170.0). No effusion. Left axillary clips. IMPRESSION: Mild left lower lung opacities. Electronically Signed   By: Nicoletta Barrier M.D.   On: 10/26/2023 19:32     Microbiology: Results for orders placed or performed during the hospital encounter of 10/26/23  Resp panel by RT-PCR (RSV, Flu A&B, Covid) Anterior Nasal Swab     Status: None   Collection Time: 10/26/23  6:28 PM   Specimen: Anterior Nasal Swab  Result Value Ref Range Status   SARS Coronavirus 2 by RT PCR NEGATIVE NEGATIVE Final    Comment: (NOTE) SARS-CoV-2 target nucleic acids are NOT DETECTED.  The SARS-CoV-2 RNA is generally detectable in upper respiratory specimens during the acute phase of infection. The lowest concentration of SARS-CoV-2 viral copies this assay can detect is 138 copies/mL. A negative result does not preclude SARS-Cov-2 infection and should not be used as the sole basis for treatment or other patient management decisions. A negative result may occur with  improper specimen collection/handling, submission of specimen other than nasopharyngeal swab, presence of viral mutation(s) within the areas targeted by this assay, and inadequate number of viral copies(<138 copies/mL). A negative result must be combined with clinical observations, patient history, and epidemiological information. The expected result is Negative.  Fact Sheet for Patients:  BloggerCourse.com  Fact Sheet for Healthcare Providers:  SeriousBroker.it  This test is no t yet approved or cleared by the [place]  FDA and  has been authorized for detection and/or diagnosis of SARS-CoV-2 by FDA under an Emergency Use Authorization (EUA). This EUA will remain  in effect (meaning this test can be used) for the duration of the COVID-19 declaration under Section 564(b)(1) of the  Act, 21 U.S.C.section 360bbb-3(b)(1), unless the authorization is terminated  or revoked sooner.       Influenza A by PCR NEGATIVE NEGATIVE Final   Influenza B by PCR NEGATIVE NEGATIVE Final    Comment: (NOTE) The Xpert Xpress SARS-CoV-2/FLU/RSV plus assay is  intended as an aid in the diagnosis of influenza from Nasopharyngeal swab specimens and should not be used as a sole basis for treatment. Nasal washings and aspirates are unacceptable for Xpert Xpress SARS-CoV-2/FLU/RSV testing.  Fact Sheet for Patients: BloggerCourse.com  Fact Sheet for Healthcare Providers: SeriousBroker.it  This test is not yet approved or cleared by the [place]  FDA and has been authorized for detection and/or diagnosis of SARS-CoV-2 by FDA under an Emergency Use Authorization (EUA). This EUA will remain in effect (meaning this test can be used) for the duration of the COVID-19 declaration under Section 564(b)(1) of the Act, 21 U.S.C. section 360bbb-3(b)(1), unless the authorization is terminated or revoked.     Resp Syncytial Virus by PCR NEGATIVE NEGATIVE Final    Comment: (NOTE) Fact Sheet for Patients: BloggerCourse.com  Fact Sheet for Healthcare Providers: SeriousBroker.it  This test is not yet approved or cleared by the [place]  FDA and has been authorized for detection and/or diagnosis of SARS-CoV-2 by FDA under an Emergency Use Authorization (EUA). This EUA will remain in effect (meaning this test can be used) for the duration of the COVID-19 declaration under Section 564(b)(1) of the Act, 21 U.S.C. section 360bbb-3(b)(1), unless the authorization is terminated or revoked.  Performed at [facility], [address] [place], [place] [address]   Respiratory (~20 pathogens) panel by PCR     Status: None   Collection Time: 10/26/23  8:33 PM   Specimen: Nasopharyngeal Swab; Respiratory  Result Value Ref Range Status   Adenovirus NOT DETECTED NOT DETECTED Final   Coronavirus 229E NOT DETECTED NOT DETECTED Final    Comment: (NOTE) The Coronavirus on the Respiratory Panel, DOES NOT test for the novel  Coronavirus (2019 nCoV)     Coronavirus HKU1 NOT DETECTED NOT DETECTED Final   Coronavirus NL63 NOT DETECTED NOT DETECTED Final   Coronavirus OC43 NOT DETECTED NOT DETECTED Final   Metapneumovirus NOT DETECTED NOT DETECTED Final   Rhinovirus / Enterovirus NOT DETECTED NOT DETECTED Final   Influenza A NOT DETECTED NOT DETECTED Final   Influenza B NOT DETECTED NOT DETECTED Final   Parainfluenza Virus 1 NOT DETECTED NOT DETECTED Final   Parainfluenza Virus 2 NOT DETECTED NOT DETECTED Final   Parainfluenza Virus 3 NOT DETECTED NOT DETECTED Final   Parainfluenza Virus 4 NOT DETECTED NOT DETECTED Final   Respiratory Syncytial Virus NOT DETECTED NOT DETECTED Final   Bordetella pertussis NOT DETECTED NOT DETECTED Final   Bordetella Parapertussis NOT DETECTED NOT DETECTED Final   Chlamydophila pneumoniae NOT DETECTED NOT DETECTED Final   Mycoplasma pneumoniae NOT DETECTED NOT DETECTED Final    Comment: Performed at [facility], [address] [place], [place] [address]    Labs: CBC: Recent Labs  Lab 10/26/23 1828 10/26/23 2033 10/27/23 0449 10/29/23 0458  WBC 14.8* 13.7* 13.8* 8.3  HGB 10.4* 9.7* 9.6* 10.6*  HCT 31.1* 30.0* 29.6* 32.9*  MCV 84.5 85.2 85.3 85.0  PLT 264 316 PLATELET CLUMPS NOTED ON SMEAR, UNABLE TO ESTIMATE 255   Basic Metabolic Panel: Recent Labs  Lab 10/26/23 1828 10/26/23 2033 10/27/23 0449 10/29/23 0458  NA 126*  --  130* 131*  K 4.0  --  3.9  3.8  CL 97*  --  102 103  CO2 21*  --  23 21*  GLUCOSE 166*  --  189* 143*  BUN 21  --  20 15  CREATININE 0.82 0.83 0.87 0.84  CALCIUM  9.0  --  8.8* 9.1   Liver Function Tests: Recent Labs  Lab 10/26/23 1828  AST 28  ALT 21  ALKPHOS 61  BILITOT 0.9  PROT 7.1  ALBUMIN 3.4*   CBG: Recent Labs  Lab 10/27/23 0743 10/27/23 1220 10/27/23 1655 10/28/23 0734 10/29/23 0717  GLUCAP 134* 136* 246* 145* 140*    Discharge time spent: {LESS THAN/GREATER THAN:26388} 30 minutes.  Signed: Aisha Hove, MD [facility] 10/29/2023

## 2023-10-29 NOTE — Evaluation (Signed)
 Occupational Therapy Evaluation Patient Details Name: Michelle Hawkins MRN: [id number] DOB: 1945/08/16 Today's Date: 10/29/2023   History of Present Illness   Pt is a 78 y.o. female  with a past medical history of hypertension, hyperlipidemia, hx of palpitations, 40-pack year smoking history, hx of breast cancer s/p left breast lumpectomy who presented to the ED on 10/26/2023 for SOB and chest pain, workup for hypoxia, pulmonary HTN, hyponatremia.     Clinical Impressions Chart reviewed, pt is alert and oriented x4, agreeable to OT evaluation. PTA pt is indep in ADL/IADL, amb with no AD. She has support from her family as needed. At this time, pt is performing ADL/mobility at a supervision-MOD I level. Vss throughout. No further OT needs identified. OT will sign off. Please re consult if there is a change in functional status.     If plan is discharge home, recommend the following:   Assistance with cooking/housework     Functional Status Assessment   Patient has not had a recent decline in their functional status     Equipment Recommendations   None recommended by OT     Recommendations for Other Services         Precautions/Restrictions   Precautions Precautions: Fall Restrictions Weight Bearing Restrictions Per Provider Order: No     Mobility Bed Mobility Overal bed mobility: Modified Independent                  Transfers Overall transfer level: Needs assistance   Transfers: Sit to/from Stand Sit to Stand: Modified independent (Device/Increase time), Supervision                  Balance Overall balance assessment: Needs assistance Sitting-balance support: Feet supported Sitting balance-Leahy Scale: Normal     Standing balance support: No upper extremity supported Standing balance-Leahy Scale: Good                             ADL either performed or assessed with clinical judgement   ADL Overall ADL's : At baseline;Modified  independent                                       General ADL Comments: pt is grossly at baseline for ADLs- LB dressing, functional mobility, grooming grossly at supervision-MOD I     Vision Patient Visual Report: No change from baseline       Perception         Praxis         Pertinent Vitals/Pain Pain Assessment Pain Assessment: Faces Faces Pain Scale: Hurts a little bit Pain Location: pain near her upper chest/throat when she takes a deep breath Pain Intervention(s): Monitored during session     Extremity/Trunk Assessment Upper Extremity Assessment Upper Extremity Assessment: Overall WFL for tasks assessed   Lower Extremity Assessment Lower Extremity Assessment: Overall WFL for tasks assessed       Communication Communication Communication: No apparent difficulties   Cognition Arousal: Alert Behavior During Therapy: WFL for tasks assessed/performed Cognition: No apparent impairments                               Following commands: Intact       Cueing  General Comments   Cueing Techniques: Verbal cues  vss throughout   Exercises  Other Exercises Other Exercises: edu re: role of OT, role of rehab, safe ADL completion   Shoulder Instructions      Home Living Family/patient expects to be discharged to:: Private residence Living Arrangements: Alone Available Help at Discharge: Family;Available PRN/intermittently Type of Home: House Home Access: Stairs to enter Entrance Stairs-Number of Steps: 1   Home Layout: Multi-level;Able to live on main level with bedroom/bathroom   Alternate Level Stairs-Rails: Left Bathroom Shower/Tub: Walk-in shower         Home Equipment: Cane - single point          Prior Functioning/Environment Prior Level of Function : Independent/Modified Independent                    OT Problem List:     OT Treatment/Interventions:        OT Goals(Current goals can be found in  the care plan section)   Acute Rehab OT Goals Patient Stated Goal: go home OT Goal Formulation: With patient Time For Goal Achievement: 11/12/23 Potential to Achieve Goals: Good   OT Frequency:       Co-evaluation PT/OT/SLP Co-Evaluation/Treatment:  (hand off for simulated ADLs after mobility)            AM-PAC OT "6 Clicks" Daily Activity     Outcome Measure Help from another person eating meals?: None Help from another person taking care of personal grooming?: None Help from another person toileting, which includes using toliet, bedpan, or urinal?: None Help from another person bathing (including washing, rinsing, drying)?: None Help from another person to put on and taking off regular upper body clothing?: None Help from another person to put on and taking off regular lower body clothing?: None 6 Click Score: 24   End of Session Nurse Communication: Mobility status  Activity Tolerance: Patient tolerated treatment well Patient left: in bed;with call bell/phone within reach  OT Visit Diagnosis: Other abnormalities of gait and mobility (R26.89)                Time: 1610-9604 OT Time Calculation (min): 9 min Charges:  OT General Charges $OT Visit: 1 Visit OT Evaluation $OT Eval Low Complexity: 1 Low  Gerre Kraft, OTD OTR/L  10/29/23, 9:48 AM

## 2023-10-31 ENCOUNTER — Ambulatory Visit
Admission: RE | Admit: 2023-10-31 | Discharge: 2023-10-31 | Disposition: A | Discharge status: Home / Self Care | Source: Ambulatory Visit | Attending: Student | Admitting: Student

## 2023-10-31 DIAGNOSIS — I2584 Coronary atherosclerosis due to calcified coronary lesion: Secondary | ICD-10-CM | POA: Diagnosis present

## 2023-10-31 DIAGNOSIS — I2510 Atherosclerotic heart disease of native coronary artery without angina pectoris: Secondary | ICD-10-CM | POA: Insufficient documentation

## 2023-10-31 DIAGNOSIS — I2089 Other forms of angina pectoris: Secondary | ICD-10-CM | POA: Insufficient documentation

## 2023-10-31 LAB — NM MYOCAR MULTI W/SPECT W/WALL MOTION / EF
Base ST Depression (mm): 0 mm
Estimated workload: 1
Exercise duration (min): 1 min
Exercise duration (sec): 0 s
LV dias vol: 55 mL (ref 46–106)
LV sys vol: 16 mL
MPHR: 142 {beats}/min
Nuc Stress EF: 71 %
Peak HR: 74 {beats}/min
Percent HR: 52 %
Rest HR: 58 {beats}/min
Rest Nuclear Isotope Dose: 10.5 mCi
SDS: 0
SRS: 0
SSS: 0
ST Depression (mm): 0 mm
Stress Nuclear Isotope Dose: 30.9 mCi
TID: 0.81

## 2023-10-31 MED ORDER — SODIUM CHLORIDE 0.9 % IV SOLN: INTRAVENOUS | Status: DC

## 2023-10-31 MED ORDER — IOHEXOL 350 MG/ML SOLN
75.0000 mL | Freq: Once | INTRAVENOUS | Status: AC | PRN
  Administered 2023-10-31: 75 mL via INTRAVENOUS

## 2023-10-31 MED ORDER — NITROGLYCERIN 0.4 MG SL SUBL
0.8000 mg | SUBLINGUAL_TABLET | Freq: Once | SUBLINGUAL | Status: AC
  Administered 2023-10-31: 0.8 mg via SUBLINGUAL

## 2023-10-31 NOTE — Progress Notes (Signed)
Patient tolerated procedure well. Ambulate w/o difficulty. Denies light headedness or being dizzy. Encouraged to drink extra water today and reasoning explained. Verbalized understanding. All questions answered. ABC intact. No further needs. Discharge from procedure area w/o issues.   

## 2023-11-06 ENCOUNTER — Other Ambulatory Visit: Payer: Self-pay | Admitting: Student

## 2023-11-06 DIAGNOSIS — I2510 Atherosclerotic heart disease of native coronary artery without angina pectoris: Secondary | ICD-10-CM

## 2023-11-06 DIAGNOSIS — I2089 Other forms of angina pectoris: Secondary | ICD-10-CM

## 2023-11-18 LAB — ECHOCARDIOGRAM COMPLETE
AR max vel: 1.64 cm2
AV Area VTI: 1.81 cm2
AV Area mean vel: 1.68 cm2
AV Mean grad: 5 mmHg
AV Peak grad: 8.8 mmHg
Ao pk vel: 1.48 m/s
Area-P 1/2: 3.28 cm2
Calc EF: 56.2 %
Height: 64 in
MV VTI: 1.78 cm2
S' Lateral: 2.9 cm
Single Plane A2C EF: 53.7 %
Single Plane A4C EF: 57.3 %
Weight: 2176.38 [oz_av]

## 2024-01-01 ENCOUNTER — Other Ambulatory Visit: Payer: Self-pay | Admitting: Internal Medicine

## 2024-01-01 DIAGNOSIS — Z1231 Encounter for screening mammogram for malignant neoplasm of breast: Secondary | ICD-10-CM

## 2024-02-10 ENCOUNTER — Encounter

## 2024-02-19 ENCOUNTER — Ambulatory Visit
Admission: RE | Admit: 2024-02-19 | Discharge: 2024-02-19 | Disposition: A | Discharge status: Home / Self Care | Payer: Self-pay | Source: Ambulatory Visit | Attending: Internal Medicine | Admitting: Internal Medicine

## 2024-02-19 DIAGNOSIS — Z1231 Encounter for screening mammogram for malignant neoplasm of breast: Secondary | ICD-10-CM | POA: Insufficient documentation

## 2024-03-02 NOTE — Progress Notes (Signed)
 DIVISION OF PULMONARY AND CRITICAL CARE MEDICINE                              FOLLOW UP ENCOUNTER     Chief complaint: Granulomatous lung disease   History of Present Illness Michelle Hawkins is a 78 year old female with granulomatous lung condition who presents with shortness of breath. She was referred by her heart doctor for evaluation of shortness of breath.  She experiences significant shortness of breath, particularly with minimal exertion such as walking short distances. She describes a sensation of being unable to breathe, which has progressively worsened over time. Despite discontinuing steroids due to adverse effects, there has been no improvement in her symptoms.  A breathing test revealed normal spirometry and total lung capacity at 100%, but a reduced DLCO at 55%. She has a history of anemia, with recent blood work indicating low hemoglobin levels. She has previously taken iron supplements but not recently.  A prior CT scan showed ground glass inflammation without evidence of blood clots. She was prescribed steroids, which she could not tolerate due to side effects, including a red, burning tongue. Her dentist identified the condition, and symptoms improved after stopping the steroids.  No improvement in shortness of breath since stopping steroids. Worsening of symptoms with minimal exertion.   Past Medical History:   Past Medical History:  Diagnosis Date  . Allergic rhinitis   . Anemia   . Breast cancer (CMS/HHS-HCC) 1996   s/p left breast lumpectomy (lymph node dissection- 2/11 positive)  . Breast cancer (CMS/HHS-HCC) 1995   right breast lumpectomy  . Diabetes mellitus (CMS/HHS-HCC)   . Dysphagia   . GERD (gastroesophageal reflux disease)   . Goiter with hyperthyroidism    multinodular goiter  . H/O ulcer disease   . Hypercholesterolemia   . Nephrolithiasis   . Scoliosis   . Skin cancer   . SVT (supraventricular tachycardia)  (CMS/HHS-HCC)   . Thrombocytosis     Past Surgical History:   Past Surgical History:  Procedure Laterality Date  . Breast Lumpectomy Right 1995   lumpectomy only  . breast lumpectomy with axillary lymph node dissection Left 1996  . Breast Lumpectomy Left 1996   lumpectomy with chemo and rad tx for breast ca (lymph node dissection  . BREAST EXCISIONAL BIOPSY Left 09/2012   benign  . ABDOMINAL HYSTERECTOMY     partial  . lumbar laminectomy L5-S1    . MASTECTOMY PARTIAL / LUMPECTOMY Left     Allergies:  No Known Allergies  Current Medications:   Prior to Admission medications  Medication Sig Taking? Last Dose  aspirin  81 MG EC tablet Take 162 mg by mouth once daily 2 tabs qhs Yes Taking  atorvastatin  (LIPITOR) 20 MG tablet TAKE ONE TABLET BY MOUTH EVERY EVENING Yes Taking  biotin 5 mg capsule Take by mouth Yes Taking  blood glucose diagnostic (ACCU-CHEK GUIDE TEST STRIPS) test strip 1 each (1 strip total) 3 (three) times daily Use as instructed. Yes Taking  blood glucose meter kit Use as directed Yes Taking  blood glucose meter kit as directed Yes Taking  diclofenac (VOLTAREN) 50 MG EC tablet Take 1 tablet (50 mg total) by mouth 2 (two) times daily as needed (for pain) Yes  Taking  docosahexaenoic acid/epa (FISH OIL ORAL) Take by mouth Yes Taking  escitalopram  oxalate (LEXAPRO ) 20 MG tablet TAKE ONE TABLET BY MOUTH DAILY Yes Taking  Herbal Supplement Take 3 capsules by mouth once daily Herbal Name: Balance of Nature Fruit and Veggie Capsule Supplements Yes Taking  lancets Use 1 each 3 (three) times daily Use as instructed. Yes Taking  levothyroxine  (SYNTHROID ) 25 MCG tablet Take 1 tablet (25 mcg total) by mouth once daily Take on an empty stomach with a glass of water at least 30-60 minutes before breakfast. Yes Taking  losartan  (COZAAR ) 100 MG tablet TAKE ONE TABLET BY MOUTH DAILY Yes Taking  metFORMIN  (GLUCOPHAGE -XR) 500 MG XR tablet Take one tablet (500 mg) in the morning, and  two tablets (1000 mg) in the evenings. Yes Taking  metoprolol  SUCCinate (TOPROL -XL) 50 MG XL tablet TAKE ONE TABLET BY MOUTH DAILY Yes Taking  omeprazole  (PRILOSEC) 40 MG DR capsule TAKE ONE CAPSULE BY MOUTH DAILY Yes Taking  turmeric-herbal complex no.278 150 mg Cap Take by mouth Yes Taking  fluticasone  propion-salmeteroL (ADVAIR HFA) 115-21 mcg/actuation inhaler Inhale 2 inhalations into the lungs every 12 (twelve) hours      Family History:   Family History  Problem Relation Name Age of Onset  . Alzheimer's disease Mother    . Diabetes type II Father    . Heart disease Father    . High blood pressure (Hypertension) Father    . Diabetes type II Sister    . Diabetes type II Sister    . Diabetes type II Sister    . Diabetes type II Sister      Social History:   Social History   Socioeconomic History  . Marital status: Widowed  Tobacco Use  . Smoking status: Former    Current packs/day: 0.00    Types: Cigarettes    Quit date: 04/14/2004    Years since quitting: 19.8  . Smokeless tobacco: Never  Vaping Use  . Vaping status: Never Used  Substance and Sexual Activity  . Alcohol use: Never  . Drug use: Never  . Sexual activity: Defer   Social Drivers of Health   Financial Resource Strain: Low Risk  (11/21/2023)   Overall Financial Resource Strain (CARDIA)   . Difficulty of Paying Living Expenses: Not hard at all  Food Insecurity: No Food Insecurity (11/21/2023)   Hunger Vital Sign   . Worried About Programme researcher, broadcasting/film/video in the Last Year: Never true   . Ran Out of Food in the Last Year: Never true  Transportation Needs: No Transportation Needs (11/21/2023)   PRAPARE - Transportation   . Lack of Transportation (Medical): No   . Lack of Transportation (Non-Medical): No  Social Connections: Moderately Integrated (10/26/2023)   Received from [facility]   Social Connection and Isolation Panel   . In a typical week, how many times do you talk on the phone with family, friends,  or neighbors?: More than three times a week   . How often do you get together with friends or relatives?: More than three times a week   . How often do you attend church or religious services?: More than 4 times per year   . Do you belong to any clubs or organizations such as church groups, unions, fraternal or athletic groups, or school groups?: Yes   . How often do you attend meetings of the clubs or organizations you belong to?: More than 4 times per year   . Are  you married, widowed, divorced, separated, never married, or living with a partner?: Widowed  Housing Stability: Low Risk  (11/21/2023)   Housing Stability Vital Sign   . Unable to Pay for Housing in the Last Year: No   . Number of Times Moved in the Last Year: 0   . Homeless in the Last Year: No    Review of Systems:   A 10 point review of systems is negative, except for the pertinent positives and negatives detailed in the HPI.  Vitals:   Vitals:   02/26/24 1255  BP: 124/67  BP Location: Right upper arm  Patient Position: Sitting  BP Cuff Size: Adult  Pulse: 52  SpO2: 91%  Weight: 62.1 kg (136 lb 14.5 oz)  Height: 162.6 cm (5' 4)     Body mass index is 23.5 kg/m.  Physical Exam:   Physical Exam Vitals and nursing note reviewed.  Constitutional:      General: in no acute distress.    Appearance: Normal appearance. Is not ill-appearing, toxic-appearing or diaphoretic.  HENT:     Head: Normocephalic and atraumatic.     Right Ear: External ear normal.     Left Ear: External ear normal.  Eyes:     General:        Right eye: No discharge.        Left eye: No discharge.     Extraocular Movements: Extraocular movements intact.     Pupils: Pupils are equal, round, and reactive to light.  Cardiovascular:     Rate and Rhythm: Normal rate and regular rhythm.     Pulses: Normal pulses.     Heart sounds: Normal heart sounds. No murmur heard.    No friction rub. No gallop.  Abdominal:     General: Bowel sounds  are normal.  Skin:    General: Skin is warm and dry.     Capillary Refill: Capillary refill takes less than 2 seconds.  Neurological:     Mental Status: Patient is alert.     Lab and Imaging Results:   Results RADIOLOGY Chest CT: No blood clots; ground-glass opacities  DIAGNOSTIC FEV1: 2.07 liters (02/26/2024) Total lung capacity: over 5 liters (02/26/2024) DLCO: 55% (02/26/2024)    Assessment and Plan:   Diagnoses and all orders for this visit:  Chronic granulomatous disease (CMS/HHS-HCC)  Ground glass opacity present on imaging of lung  Chronic anemia -     Ambulatory Referral to Hematology-Benign  Moderate persistent reactive airway disease with acute exacerbation (HHS-HCC)  Other orders -     fluticasone  propion-salmeteroL (ADVAIR HFA) 115-21 mcg/actuation inhaler; Inhale 2 inhalations into the lungs every 12 (twelve) hours    Assessment & Plan 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 Spirometry shows FEV1 of 2.07 liters (100%), indicating no airway obstruction. Total lung capacity is over 5 liters (100%), indicating no restriction. DLCO is reduced at 55%, partially attributed to anemia. CT scan shows ground glass opacities indicating inflammation. Previous treatment with oral steroids resulted in adverse effects (tongue redness and burning). - Prescribe inhaled corticosteroid inhaler to be used twice daily to reduce airway inflammation. - Educate on the use of inhaler, emphasizing it is not like oral steroids and has fewer systemic side effects. - Reassure that the condition is not emphysema or cancer.  Anemia contributing to impaired oxygenation Anemia contributing to impaired oxygenation, with hemoglobin levels low, affecting DLCO and contributing to shortness of  breath. Anemia is suspected to be a significant factor in reduced oxygenation capacity. -  Refer to [facility] for anemia workup and potential iron infusions. - Consider iron supplementation if recommended by the specialist.  Chronic shortness of breath due to granulomatous lung disease and anemia Chronic shortness of breath attributed to granulomatous lung disease and anemia. Symptoms include difficulty breathing with minimal exertion, such as walking short distances. Inhaled corticosteroids are expected to provide gentle but steady improvement over approximately three months. - Address granulomatous lung disease with inhaled corticosteroids. - Address anemia with referral for potential iron infusions.     I spent a total of 41 minutes in both face-to-face and non-face-to-face activities, excluding procedures performed, for this visit on the date of this encounter.   This note has been created using dictation software tool and any typographical errors are purely unintentional.  Patient received an After Visit Summary

## 2024-03-17 ENCOUNTER — Encounter: Payer: Self-pay | Admitting: Oncology

## 2024-03-17 ENCOUNTER — Inpatient Hospital Stay

## 2024-03-17 ENCOUNTER — Ambulatory Visit: Payer: Self-pay | Admitting: Oncology

## 2024-03-17 ENCOUNTER — Inpatient Hospital Stay: Attending: Oncology | Admitting: Oncology

## 2024-03-17 ENCOUNTER — Other Ambulatory Visit: Payer: Self-pay

## 2024-03-17 VITALS — BP 118/61 | HR 62 | Temp 96.2°F | Resp 18 | Wt 138.1 lb

## 2024-03-17 DIAGNOSIS — R946 Abnormal results of thyroid function studies: Secondary | ICD-10-CM | POA: Diagnosis not present

## 2024-03-17 DIAGNOSIS — D649 Anemia, unspecified: Secondary | ICD-10-CM

## 2024-03-17 DIAGNOSIS — Z79620 Long term (current) use of immunosuppressive biologic: Secondary | ICD-10-CM | POA: Insufficient documentation

## 2024-03-17 DIAGNOSIS — Z87891 Personal history of nicotine dependence: Secondary | ICD-10-CM | POA: Insufficient documentation

## 2024-03-17 DIAGNOSIS — D509 Iron deficiency anemia, unspecified: Secondary | ICD-10-CM | POA: Insufficient documentation

## 2024-03-17 DIAGNOSIS — Z79899 Other long term (current) drug therapy: Secondary | ICD-10-CM | POA: Insufficient documentation

## 2024-03-17 DIAGNOSIS — R7989 Other specified abnormal findings of blood chemistry: Secondary | ICD-10-CM | POA: Insufficient documentation

## 2024-03-17 DIAGNOSIS — J8410 Pulmonary fibrosis, unspecified: Secondary | ICD-10-CM | POA: Diagnosis not present

## 2024-03-17 LAB — CBC WITH DIFFERENTIAL/PLATELET
Abs Immature Granulocytes: 0.16 K/uL — ABNORMAL HIGH (ref 0.00–0.07)
Basophils Absolute: 0.1 K/uL (ref 0.0–0.1)
Basophils Relative: 1 %
Eosinophils Absolute: 0.3 K/uL (ref 0.0–0.5)
Eosinophils Relative: 2 %
HCT: 35.6 % — ABNORMAL LOW (ref 36.0–46.0)
Hemoglobin: 11.2 g/dL — ABNORMAL LOW (ref 12.0–15.0)
Immature Granulocytes: 1 %
Lymphocytes Relative: 14 %
Lymphs Abs: 1.9 K/uL (ref 0.7–4.0)
MCH: 26 pg (ref 26.0–34.0)
MCHC: 31.5 g/dL (ref 30.0–36.0)
MCV: 82.6 fL (ref 80.0–100.0)
Monocytes Absolute: 1 K/uL (ref 0.1–1.0)
Monocytes Relative: 7 %
Neutro Abs: 9.7 K/uL — ABNORMAL HIGH (ref 1.7–7.7)
Neutrophils Relative %: 75 %
Platelets: 393 K/uL (ref 150–400)
RBC: 4.31 MIL/uL (ref 3.87–5.11)
RDW: 15.8 % — ABNORMAL HIGH (ref 11.5–15.5)
WBC: 13 K/uL — ABNORMAL HIGH (ref 4.0–10.5)
nRBC: 0 % (ref 0.0–0.2)

## 2024-03-17 LAB — RETIC PANEL
Immature Retic Fract: 24.9 % — ABNORMAL HIGH (ref 2.3–15.9)
RBC.: 4.27 MIL/uL (ref 3.87–5.11)
Retic Count, Absolute: 78.6 K/uL (ref 19.0–186.0)
Retic Ct Pct: 1.8 % (ref 0.4–3.1)
Reticulocyte Hemoglobin: 28.6 pg (ref >27.9)

## 2024-03-17 LAB — T4, FREE: Free T4: 5.49 ng/dL — ABNORMAL HIGH (ref 0.61–1.12)

## 2024-03-17 LAB — IRON AND TIBC
Iron: 44 ug/dL (ref 28–170)
Saturation Ratios: 8 % — ABNORMAL LOW (ref 10.4–31.8)
TIBC: 581 ug/dL — ABNORMAL HIGH (ref 250–450)
UIBC: 537 ug/dL

## 2024-03-17 LAB — FOLATE: Folate: >20 ng/mL (ref >5.9)

## 2024-03-17 LAB — VITAMIN B12: Vitamin B-12: 433 pg/mL (ref 180–914)

## 2024-03-17 LAB — FERRITIN: Ferritin: 10 ng/mL — ABNORMAL LOW (ref 11–307)

## 2024-03-17 LAB — TSH: TSH: 4.769 u[IU]/mL — ABNORMAL HIGH (ref 0.350–4.500)

## 2024-03-17 LAB — LACTATE DEHYDROGENASE: LDH: 181 U/L (ref 98–192)

## 2024-03-17 NOTE — Progress Notes (Signed)
 Hematology/Oncology Consult note [phone] [phone]        REFERRING PROVIDER: Parris Manna, MD   CHIEF COMPLAINTS/REASON FOR VISIT:  Evaluation of anemia    ASSESSMENT & PLAN:   Iron deficiency anemia Previous lab results were reviewed and discussed with Michelle Hawkins. Anemia, check iron TIBC ferritin, CBC, smear, reticulocyte panel, B12, multiple myeloma panel, maturation, haptoglobin, folate, TSH LDH.  Lab Results  Component Value Date   HGB 11.2 (L) 03/17/2024   TIBC 581 (H) 03/17/2024   IRONPCTSAT 8 (L) 03/17/2024   FERRITIN 10 (L) 03/17/2024    Michelle Hawkins has iron deficiency anemia. I discussed about option of continue oral iron supplementation and repeat blood work for evaluation of treatment response.  If no significant improvement, then proceed with IV Venofer treatments. Alternative option of proceed with IV Venofer treatments. I discussed about the potential risks including but not limited to allergic reactions/infusion reactions including anaphylactic reactions, diarrhea, phlebitis, high blood pressure, wheezing, SOB, skin rash, weight gain,dark urine, leg swelling, back pain, headache, nausea and fatigue, etc. Michelle Hawkins has history of gastric ulcer, gastritis and may not tolerate oral iron supplementation.  Michelle Hawkins with IV Venofer treatments.  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 Aleskerov and was recommended to start steroid inhaler.  Continue follow-up with pulmonology  Elevated TSH Elevated TSH, with elevated free T4. Discordant.  Recommend Michelle Hawkins to follow up with PCP for further evaluation.     Orders Placed This Encounter  Procedures   CBC with Differential/Platelet    Standing Status:   Future    Expected Date:   03/17/2024    Expiration Date:   03/17/2025   Vitamin B12    Standing Status:   Future    Number of  Occurrences:   1    Expected Date:   03/17/2024    Expiration Date:   03/17/2025   Iron and TIBC    Standing Status:   Future    Number of Occurrences:   1    Expected Date:   03/17/2024    Expiration Date:   03/17/2025   Ferritin    Standing Status:   Future    Number of Occurrences:   1    Expected Date:   03/17/2024    Expiration Date:   09/14/2024   CBC with Differential/Platelet    Standing Status:   Future    Number of Occurrences:   1    Expected Date:   03/17/2024    Expiration Date:   03/17/2025   Retic Panel    Standing Status:   Future    Number of Occurrences:   1    Expected Date:   03/17/2024    Expiration Date:   03/17/2025   Kappa/lambda light chains    Standing Status:   Future    Number of Occurrences:   1    Expected Date:   03/17/2024    Expiration Date:   03/17/2025   Multiple Myeloma Panel (SPEP&IFE w/QIG)    Standing Status:   Future    Number of Occurrences:   1    Expected Date:   03/17/2024    Expiration Date:   03/17/2025   Lactate dehydrogenase    Standing Status:   Future    Number of Occurrences:   1    Expected Date:   03/17/2024  Expiration Date:   03/17/2025   Haptoglobin    Standing Status:   Future    Number of Occurrences:   1    Expected Date:   03/17/2024    Expiration Date:   03/17/2025   Folate    Standing Status:   Future    Number of Occurrences:   1    Expected Date:   03/17/2024    Expiration Date:   03/17/2025   TSH    Standing Status:   Future    Number of Occurrences:   1    Expected Date:   03/17/2024    Expiration Date:   03/17/2025   Follow-up in 3 months. All questions were answered. The Michelle Hawkins knows to call the clinic with any problems, questions or concerns.  Zelphia Cap, MD, PhD [facility] Hematology Oncology 03/17/2024   HISTORY OF PRESENTING ILLNESS:   Michelle Hawkins is a  78 y.o.  female with PMH listed below was seen in consultation at the request of  Parris Manna, MD  for evaluation of anemia.    Discussed the use of AI scribe software  for clinical note transcription with the Michelle Hawkins, who gave verbal consent to proceed.   Michelle Hawkins experiences extreme shortness of breath and fatigue, particularly when walking, which significantly limits her physical activity. Michelle Hawkins feels weak and unable to do much, stating 'I can't do that much.'  Her hemoglobin levels have been monitored over the past few months. In April, her hemoglobin was 9.6, indicating anemia, and an iron panel showed iron deficiency. Michelle Hawkins was hospitalized in April 2025 for hypoxia and pulmonary hypertension. Since then, her hemoglobin levels have improved without iron supplementation, reaching 11.2 in August 2025  Michelle Hawkins has not taken any iron supplements since April.  Michelle Hawkins has a remote history of stomach ulcers and chronic gastritis, which may affect her ability to tolerate oral iron supplements. Michelle Hawkins recalls having a bleeding ulcer in the remote past, which required hospitalization due to significant blood loss. Michelle Hawkins also reports experiencing reflux throughout her life.  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 Michelle Hawkins was seen by pulmonology and has not yet started inhaled steroid treatment due to issues with obtaining the medication.  No recent bleeding events or blood in the stool. Michelle Hawkins reports chronic reflux and a history of stomach pain since childhood.   MEDICAL HISTORY:  Past Medical History:  Diagnosis Date   Allergic rhinitis    Anxiety    Breast cancer (HCC) 1996   s/p left breast lumpectomy (lymph node dissection - 2/11 positive), chemotherapy   Breast cancer (HCC) 1995   right breast lumpectomy   Complication of anesthesia    slow to wake   Depression    Diabetes mellitus    Dysphagia    Family history of adverse reaction to anesthesia    Mother - slow to wake   GERD (gastroesophageal reflux disease)    H/O ulcer disease    PUD   Hypercholesterolemia    Hypertension    Hypothyroidism    multinodular goiter   Nephrolithiasis    Personal history  of chemotherapy    Personal history of radiation therapy    Scoliosis    Skin cancer    SVT (supraventricular tachycardia) (HCC)     SURGICAL HISTORY: Past Surgical History:  Procedure Laterality Date   ABDOMINAL HYSTERECTOMY     partial   BREAST BIOPSY Left 09/2012   benign   BREAST EXCISIONAL BIOPSY Right  1995 benign   BREAST LUMPECTOMY Left 1996   lumpectomy with chemo and rad tx for breast ca (lymph node dissection - 2/11 positive   BREAST LUMPECTOMY WITH AXILLARY LYMPH NODE DISSECTION  1996   left   CATARACT EXTRACTION W/PHACO Left 11/29/2020   Procedure: CATARACT EXTRACTION PHACO AND INTRAOCULAR LENS PLACEMENT (IOC) LEFT DIABETIC VIVITY TORIC;  Surgeon: Jaye Fallow, MD;  Location: [facility];  Service: Ophthalmology;  Laterality: Left;  Diabetic - oral meds 6.88 00:40.7   CATARACT EXTRACTION W/PHACO Right 12/13/2020   Procedure: CATARACT EXTRACTION PHACO AND INTRAOCULAR LENS PLACEMENT (IOC) RIGHT DIABETIC VIVITY TORIC 5.44 00:40.3;  Surgeon: Jaye Fallow, MD;  Location: [facility];  Service: Ophthalmology;  Laterality: Right;  Diabetic - oral meds   COLONOSCOPY WITH PROPOFOL  N/A 06/03/2019   Procedure: COLONOSCOPY WITH PROPOFOL ;  Surgeon: Dessa Reyes ORN, MD;  Location: [facility];  Service: Endoscopy;  Laterality: N/A;   ESOPHAGOGASTRODUODENOSCOPY N/A 06/03/2019   Procedure: ESOPHAGOGASTRODUODENOSCOPY (EGD);  Surgeon: Dessa Reyes ORN, MD;  Location: [facility]COPY;  Service: Endoscopy;  Laterality: N/A;   LUMBAR LAMINECTOMY  09/2005   L5-S1    SOCIAL HISTORY: Social History   Socioeconomic History   Marital status: Widowed    Spouse name: Not on file   Number of children: Not on file   Years of education: Not on file   Highest education level: Not on file  Occupational History   Not on file  Tobacco Use   Smoking status: Former    Current packs/day: 0.00    Average packs/day: 0.5 packs/day for 40.0 years (20.0 ttl  pk-yrs)    Types: Cigarettes    Start date: 04/14/1964    Quit date: 04/14/2004    Years since quitting: 19.9   Smokeless tobacco: Never  Vaping Use   Vaping status: Never Used  Substance and Sexual Activity   Alcohol use: No    Alcohol/week: 0.0 standard drinks of alcohol   Drug use: No   Sexual activity: Never  Other Topics Concern   Not on file  Social History Narrative   Not on file   Social Drivers of Health   Financial Resource Strain: Low Risk  (11/21/2023)   Received from [facility]   Overall Financial Resource Strain (CARDIA)    Difficulty of Paying Living Expenses: Not hard at all  Food Insecurity: No Food Insecurity (11/21/2023)   Received from [facility]   Hunger Vital Sign    Within the past 12 months, you worried that your food would run out before you got the money to buy more.: Never true    Within the past 12 months, the food you bought just didn't last and you didn't have money to get more.: Never true  Transportation Needs: No Transportation Needs (11/21/2023)   Received from [facility] - Transportation    In the past 12 months, has lack of transportation kept you from medical appointments or from getting medications?: No    Lack of Transportation (Non-Medical): No  Physical Activity: Not on file  Stress: Not on file  Social Connections: Moderately Integrated (10/26/2023)   Social Connection and Isolation Panel    Frequency of Communication with Friends and Family: More than three times a week    Frequency of Social Gatherings with Friends and Family: More than three times a week    Attends Religious Services: More than 4 times per year    Active Member of  Clubs or Organizations: Yes    Attends Banker Meetings: More than 4 times per year    Marital Status: Widowed  Intimate Partner Violence: Not At Risk (10/26/2023)   Humiliation, Afraid, Rape, and Kick questionnaire    Fear of  Current or Ex-Partner: No    Emotionally Abused: No    Physically Abused: No    Sexually Abused: No    FAMILY HISTORY: Family History  Problem Relation Age of Onset   Alzheimer's disease Mother    Heart disease Father    Hypertension Father    Diabetes Father    Diabetes Sister    Diabetes Sister    Diabetes Sister    Diabetes Sister    Breast cancer Neg Hx     ALLERGIES:  has no known allergies.  MEDICATIONS:  Current Outpatient Medications  Medication Sig Dispense Refill   Accu-Chek Softclix Lancets lancets Check blood sugar twice daily Dx 250.00 200 each 1   acetaminophen  (TYLENOL ) 500 MG tablet Take 500 mg by mouth every 6 (six) hours as needed for mild pain (pain score 1-3).     aspirin  81 MG tablet Take 81 mg by mouth 2 (two) times daily.      atorvastatin  (LIPITOR) 20 MG tablet TAKE ONE TABLET EVERY DAY 90 tablet 2   Biotin 5 MG CAPS Take by mouth.     Blood Glucose Monitoring Suppl (ONE TOUCH ULTRA SYSTEM KIT) w/Device KIT 1 kit by Does not apply route once.     calcium  citrate (CALCITRATE - DOSED IN MG ELEMENTAL CALCIUM ) 950 MG tablet Take 200 mg of elemental calcium  by mouth daily.     cholecalciferol (VITAMIN D) 400 UNITS TABS Take by mouth.     Continuous Blood Gluc Receiver (FREESTYLE LIBRE 14 DAY READER) DEVI Inject 1 Device into the skin every 14 (fourteen) days. Place 1 sensor every 14 days. Use to check sugar at least 4 times daily 6 each 3   Continuous Blood Gluc Sensor (FREESTYLE LIBRE 14 DAY SENSOR) MISC Inject 1 Device into the skin every 14 (fourteen) days. 6 each 3   cyclobenzaprine (FLEXERIL) 10 MG tablet Take 10 mg by mouth 2 (two) times daily as needed.     escitalopram  (LEXAPRO ) 20 MG tablet TAKE ONE TABLET EVERY DAY 90 tablet 1   ferrous sulfate 325 (65 FE) MG EC tablet Take 325 mg by mouth daily with breakfast.     fish oil-omega-3 fatty acids 1000 MG capsule Take 2 g by mouth daily.     glucose blood (ONE TOUCH ULTRA TEST) test strip CHECK BLOOD  SUGAR UP TO THREE TIMES DAILY Dx E11.9 300 each 3   isosorbide  mononitrate (IMDUR ) 30 MG 24 hr tablet Take 1 tablet (30 mg total) by mouth daily. 30 tablet 2   levothyroxine  (SYNTHROID ) 25 MCG tablet TAKE 1 TABLET EVERY DAY ON EMPTY STOMACHWITH A GLASS OF WATER AT LEAST 30-60 MINBEFORE BREAKFAST 90 tablet 1   losartan  (COZAAR ) 100 MG tablet TAKE 1 TABLET BY MOUTH DAILY 90 tablet 1   metFORMIN  (GLUCOPHAGE -XR) 500 MG 24 hr tablet TAKE TWO TABLETS TWICE DAILY (Michelle Hawkins taking differently: Take 500-1,000 mg by mouth 2 (two) times daily. 500mg  qam and 1,000mg  qpm) 360 tablet 3   metoprolol  succinate (TOPROL -XL) 50 MG 24 hr tablet TAKE 1 TABLET BY MOUTH DAILY WITH OR FOLLOWING A MEAL 90 tablet 1   Multiple Vitamin (MULTIVITAMIN WITH MINERALS) TABS tablet Take 1 tablet by mouth daily.  omeprazole  (PRILOSEC) 40 MG capsule TAKE 1 CAPSULE EVERY DAY 90 capsule 1   Probiotic Product (PROBIOTIC DAILY PO) Take 1 tablet by mouth.     TURMERIC PO Take by mouth daily.      No current facility-administered medications for this visit.    Review of Systems  Constitutional:  Positive for fatigue. Negative for appetite change, chills and fever.  HENT:   Negative for hearing loss and voice change.   Eyes:  Negative for eye problems.  Respiratory:  Positive for shortness of breath. Negative for chest tightness and cough.   Cardiovascular:  Negative for chest pain.  Gastrointestinal:  Negative for abdominal distention, abdominal pain and blood in stool.  Endocrine: Negative for hot flashes.  Genitourinary:  Negative for difficulty urinating and frequency.   Musculoskeletal:  Negative for arthralgias.  Skin:  Negative for itching and rash.  Neurological:  Negative for extremity weakness.  Hematological:  Negative for adenopathy.  Psychiatric/Behavioral:  Negative for confusion.    PHYSICAL EXAMINATION:  Vitals:   03/17/24 1111  BP: 118/61  Pulse: 62  Resp: 18  Temp: (!) 96.2 F (35.7 C)  SpO2: 93%    Filed Weights   03/17/24 1111  Weight: 138 lb 1.6 oz (62.6 kg)    Physical Exam Constitutional:      General: Michelle Hawkins is not in acute distress. HENT:     Head: Normocephalic and atraumatic.  Eyes:     General: No scleral icterus. Cardiovascular:     Rate and Rhythm: Normal rate and regular rhythm.  Pulmonary:     Effort: Pulmonary effort is normal. No respiratory distress.     Breath sounds: No wheezing.  Abdominal:     General: Bowel sounds are normal. There is no distension.     Palpations: Abdomen is soft.  Musculoskeletal:        General: No deformity. Normal range of motion.     Cervical back: Normal range of motion and neck supple.  Skin:    General: Skin is warm and dry.     Findings: No erythema or rash.  Neurological:     Mental Status: Michelle Hawkins is alert and oriented to person, place, and time. Mental status is at baseline.  Psychiatric:        Mood and Affect: Mood normal.     LABORATORY DATA:  I have reviewed the data as listed    Latest Ref Rng & Units 03/17/2024   11:57 AM 10/29/2023    4:58 AM 10/27/2023    4:49 AM  CBC  WBC 4.0 - 10.5 K/uL 13.0  8.3  13.8   Hemoglobin 12.0 - 15.0 g/dL 88.7  89.3  9.6   Hematocrit 36.0 - 46.0 % 35.6  32.9  29.6   Platelets 150 - 400 K/uL 393  255  PLATELET CLUMPS NOTED ON SMEAR, UNABLE TO ESTIMATE       Latest Ref Rng & Units 10/29/2023    4:58 AM 10/27/2023    4:49 AM 10/26/2023    8:33 PM  CMP  Glucose 70 - 99 mg/dL 856  810    BUN 8 - 23 mg/dL 15  20    Creatinine 9.55 - 1.00 mg/dL 9.15  9.12  9.16   Sodium 135 - 145 mmol/L 131  130    Potassium 3.5 - 5.1 mmol/L 3.8  3.9    Chloride 98 - 111 mmol/L 103  102    CO2 22 - 32 mmol/L 21  23  Calcium  8.9 - 10.3 mg/dL 9.1  8.8        RADIOGRAPHIC STUDIES: I have personally reviewed the radiological images as listed and agreed with the findings in the report. MM 3D SCREENING MAMMOGRAM BILATERAL BREAST Result Date: 02/20/2024 CLINICAL DATA:  Screening. EXAM: DIGITAL  SCREENING BILATERAL MAMMOGRAM WITH TOMOSYNTHESIS AND CAD TECHNIQUE: Bilateral screening digital craniocaudal and mediolateral oblique mammograms were obtained. Bilateral screening digital breast tomosynthesis was performed. The images were evaluated with computer-aided detection. COMPARISON:  Previous exam(s). ACR Breast Density Category b: There are scattered areas of fibroglandular density. FINDINGS: There are no findings suspicious for malignancy. IMPRESSION: No mammographic evidence of malignancy. A result letter of this screening mammogram will be mailed directly to the Michelle Hawkins. RECOMMENDATION: Screening mammogram in one year. (Code:SM-B-01Y) BI-RADS CATEGORY  1: Negative. Electronically Signed   By: Craig Farr M.D.   On: 02/20/2024 14:25

## 2024-03-17 NOTE — Assessment & Plan Note (Signed)
 Elevated TSH, with elevated free T4. Discordant.  Recommend patient to follow up with PCP for further evaluation.

## 2024-03-17 NOTE — Assessment & Plan Note (Signed)
 Patient was seen by Dr. Aleskerov and was recommended to start steroid inhaler.  Continue follow-up with pulmonology

## 2024-03-17 NOTE — Assessment & Plan Note (Addendum)
 Previous lab results were reviewed and discussed with patient. Anemia, check iron TIBC ferritin, CBC, smear, reticulocyte panel, B12, multiple myeloma panel, maturation, haptoglobin, folate, TSH LDH.  Lab Results  Component Value Date   HGB 11.2 (L) 03/17/2024   TIBC 581 (H) 03/17/2024   IRONPCTSAT 8 (L) 03/17/2024   FERRITIN 10 (L) 03/17/2024    Patient has iron deficiency anemia. I discussed about option of continue oral iron supplementation and repeat blood work for evaluation of treatment response.  If no significant improvement, then proceed with IV Venofer treatments. Alternative option of proceed with IV Venofer treatments. I discussed about the potential risks including but not limited to allergic reactions/infusion reactions including anaphylactic reactions, diarrhea, phlebitis, high blood pressure, wheezing, SOB, skin rash, weight gain,dark urine, leg swelling, back pain, headache, nausea and fatigue, etc. patient has history of gastric ulcer, gastritis and may not tolerate oral iron supplementation.  She agrees with IV Venofer treatments.  Plan IV venofer weekly x 4   Recommend patient to establish with gastroenterology for evaluation

## 2024-03-18 LAB — KAPPA/LAMBDA LIGHT CHAINS
Kappa free light chain: 41.1 mg/L — ABNORMAL HIGH (ref 3.3–19.4)
Kappa, lambda light chain ratio: 1.45 (ref 0.26–1.65)
Lambda free light chains: 28.3 mg/L — ABNORMAL HIGH (ref 5.7–26.3)

## 2024-03-18 NOTE — Telephone Encounter (Signed)
-----   Message from Zelphia Cap sent at 03/17/2024  8:07 PM EDT ----- Please let patient know that her blood work showed an iron deficiency anemia.  I recommend IV Venofer weekly x 4.  If she agrees please arrange.  Follow-up with me in 3 months prior to MD +/-  Venofer.  Labs ordered.  Also TSH is elevated.  T4 is also elevated.  Results are discordant.  I recommend patient to follow-up with primary care provider for further evaluation.  Please send TSH and T4 results to PCP. ----- Message ----- From: Interface, Lab In [place] Sent: 03/17/2024  12:24 PM EDT To: Zelphia Cap, MD

## 2024-03-18 NOTE — Telephone Encounter (Signed)
 Spoke to patient and informed her of iron and thyroid  levels and recommendation of iron infusions. TSH and T4 results faxed to Dr. Willodean office.   Please contact pt schedule appts:   IV venfoer weekly x4 (first dose NEW) 3 months: labs prior to MD/ +/- venofer

## 2024-03-19 LAB — MULTIPLE MYELOMA PANEL, SERUM
Albumin SerPl Elph-Mcnc: 3.7 g/dL (ref 2.9–4.4)
Albumin/Glob SerPl: 1.1 (ref 0.7–1.7)
Alpha 1: 0.2 g/dL (ref 0.0–0.4)
Alpha2 Glob SerPl Elph-Mcnc: 0.9 g/dL (ref 0.4–1.0)
B-Globulin SerPl Elph-Mcnc: 1.3 g/dL (ref 0.7–1.3)
Gamma Glob SerPl Elph-Mcnc: 1.2 g/dL (ref 0.4–1.8)
Globulin, Total: 3.7 g/dL (ref 2.2–3.9)
IgA: 199 mg/dL (ref 64–422)
IgG (Immunoglobin G), Serum: 1265 mg/dL (ref 586–1602)
IgM (Immunoglobulin M), Srm: 104 mg/dL (ref 26–217)
Total Protein ELP: 7.4 g/dL (ref 6.0–8.5)

## 2024-03-19 LAB — HAPTOGLOBIN: Haptoglobin: 132 mg/dL (ref 42–346)

## 2024-03-23 ENCOUNTER — Inpatient Hospital Stay

## 2024-03-23 VITALS — BP 112/62 | HR 75 | Temp 97.2°F | Resp 18

## 2024-03-23 DIAGNOSIS — D509 Iron deficiency anemia, unspecified: Secondary | ICD-10-CM

## 2024-03-23 MED ORDER — IRON SUCROSE 20 MG/ML IV SOLN
200.0000 mg | Freq: Once | INTRAVENOUS | Status: AC
  Administered 2024-03-23: 200 mg via INTRAVENOUS
  Filled 2024-03-23: qty 10

## 2024-03-23 NOTE — Patient Instructions (Signed)

## 2024-03-25 ENCOUNTER — Inpatient Hospital Stay

## 2024-03-25 VITALS — BP 112/58 | HR 67 | Temp 97.8°F | Resp 18

## 2024-03-25 DIAGNOSIS — D509 Iron deficiency anemia, unspecified: Secondary | ICD-10-CM

## 2024-03-25 MED ORDER — SODIUM CHLORIDE 0.9% FLUSH
10.0000 mL | Freq: Once | INTRAVENOUS | Status: AC | PRN
  Administered 2024-03-25: 10 mL
  Filled 2024-03-25: qty 10

## 2024-03-25 MED ORDER — IRON SUCROSE 20 MG/ML IV SOLN
200.0000 mg | Freq: Once | INTRAVENOUS | Status: AC
  Administered 2024-03-25: 200 mg via INTRAVENOUS
  Filled 2024-03-25: qty 10

## 2024-03-30 ENCOUNTER — Inpatient Hospital Stay

## 2024-03-30 VITALS — BP 107/57 | HR 60 | Temp 98.2°F | Resp 16

## 2024-03-30 DIAGNOSIS — D509 Iron deficiency anemia, unspecified: Secondary | ICD-10-CM

## 2024-03-30 MED ORDER — IRON SUCROSE 20 MG/ML IV SOLN
200.0000 mg | Freq: Once | INTRAVENOUS | Status: AC
  Administered 2024-03-30: 200 mg via INTRAVENOUS
  Filled 2024-03-30: qty 10

## 2024-03-30 NOTE — Patient Instructions (Signed)

## 2024-04-01 ENCOUNTER — Inpatient Hospital Stay

## 2024-04-01 VITALS — BP 98/54 | HR 57 | Temp 97.5°F | Resp 18

## 2024-04-01 DIAGNOSIS — D509 Iron deficiency anemia, unspecified: Secondary | ICD-10-CM

## 2024-04-01 MED ORDER — IRON SUCROSE 20 MG/ML IV SOLN
200.0000 mg | Freq: Once | INTRAVENOUS | Status: AC
  Administered 2024-04-01: 200 mg via INTRAVENOUS
  Filled 2024-04-01: qty 10

## 2024-04-01 NOTE — Patient Instructions (Signed)

## 2024-04-15 ENCOUNTER — Encounter: Payer: Self-pay | Admitting: Oncology

## 2024-04-22 ENCOUNTER — Other Ambulatory Visit
Admission: RE | Admit: 2024-04-22 | Discharge: 2024-04-22 | Disposition: A | Discharge status: Home / Self Care | Source: Ambulatory Visit | Attending: Emergency Medicine | Admitting: Emergency Medicine

## 2024-04-22 DIAGNOSIS — R0609 Other forms of dyspnea: Secondary | ICD-10-CM | POA: Diagnosis present

## 2024-04-22 DIAGNOSIS — R0602 Shortness of breath: Secondary | ICD-10-CM | POA: Diagnosis present

## 2024-04-22 LAB — D-DIMER, QUANTITATIVE: D-Dimer, Quant: 0.52 ug{FEU}/mL — ABNORMAL HIGH (ref 0.00–0.50)

## 2024-05-08 ENCOUNTER — Other Ambulatory Visit: Payer: Self-pay | Admitting: Emergency Medicine

## 2024-05-08 DIAGNOSIS — R0609 Other forms of dyspnea: Secondary | ICD-10-CM

## 2024-05-14 ENCOUNTER — Ambulatory Visit
Admission: RE | Admit: 2024-05-14 | Discharge: 2024-05-14 | Disposition: A | Discharge status: Home / Self Care | Source: Ambulatory Visit | Attending: Emergency Medicine | Admitting: Emergency Medicine

## 2024-05-14 DIAGNOSIS — R0609 Other forms of dyspnea: Secondary | ICD-10-CM | POA: Insufficient documentation

## 2024-05-22 ENCOUNTER — Other Ambulatory Visit: Payer: Self-pay | Admitting: Emergency Medicine

## 2024-05-22 DIAGNOSIS — R0602 Shortness of breath: Secondary | ICD-10-CM

## 2024-05-22 DIAGNOSIS — R7989 Other specified abnormal findings of blood chemistry: Secondary | ICD-10-CM

## 2024-05-26 ENCOUNTER — Ambulatory Visit
Admission: RE | Admit: 2024-05-26 | Discharge: 2024-05-26 | Disposition: A | Discharge status: Home / Self Care | Source: Ambulatory Visit | Attending: Emergency Medicine | Admitting: Emergency Medicine

## 2024-05-26 ENCOUNTER — Inpatient Hospital Stay: Admission: RE | Admit: 2024-05-26 | Discharge: 2024-05-26 | Attending: Emergency Medicine

## 2024-05-26 DIAGNOSIS — R0602 Shortness of breath: Secondary | ICD-10-CM

## 2024-05-26 DIAGNOSIS — R7989 Other specified abnormal findings of blood chemistry: Secondary | ICD-10-CM | POA: Diagnosis present

## 2024-05-26 MED ORDER — TECHNETIUM TO 99M ALBUMIN AGGREGATED
3.8600 | Freq: Once | INTRAVENOUS | Status: AC | PRN
  Administered 2024-05-26: 3.86 via INTRAVENOUS

## 2024-06-08 NOTE — Progress Notes (Signed)
 Established Patient Visit   Chief Complaint: Chief Complaint  Patient presents with  . Hypertension  . Palpitations    7 mo   Date of Service: 06/08/2024 Date of Birth: [birth year] PCP: Sherial Bail, MD  History of Present Illness: Michelle Hawkins is a 78 y.o.female patient with a history of palpitations, hyperlipidemia, type II diabetes, 40-pack year smoking history, and hypertension. The patient was previously followed by Dr. Bosie. The patient reports a greater than 1 week history of intermittent palpitations that are brief in nature, described as a fluttering sensation, without prolonged heart racing. She underwent a stress echocardiogram on 04/24/2018, exercised for 5 minutes on a Bruce protocol, achieving a workload of 6.5 METS with normal left ventricular function of LVEF greater than 55% at rest and at peak exercise. There was mild tricuspid regurgitation. She wore a Holter monitor from 5/9-5/06/2022, which revealed predominant sinus bradycardia with a mean heart rate of 57 bpm, sinus heart rate ranging from 50-80 bpm.  There were occasional premature atrial contractions and rare premature ventricular contractions present.  There were 7 atrial runs, the longest lasting 9 beats.  There were no diary entries.   The patient was admitted/19/2025 with chest pain and shortness of breath.  She ruled out for myocardial infarction with negative troponin x 2.  Lexiscan  Myoview /20 07/2023 did not reveal evidence for ischemia.  CT angiography 10/27/2023 revealed minimal (less than 25%) stenosis left circumflex.    The patient returns for follow-up, reports doing okay.  She denies exertional chest pain.  She reports progressive exertional shortness of breath.  She denies presyncope or syncope.  She denies lower extremity edema.  She denies presyncope or syncope.   The patient is not very active, does not exercise regularly, limited by chronic low back pain, with history of herniated disc and surgery, as well  as, exertional dyspnea.  Chest CT 05/19/2024 revealed mild bibasilar pulmonary fibrosis and large main pulmonary artery consistent with pulmonary hypertension.   The patient has essential hypertension, currently well controlled on losartan  and metoprolol  succinate, which are tolerated well without apparent side effects with the exception of sinus bradycardia.  The patient follows a low-sodium, no added salt diet.  The patient has hyperlipidemia, LDL 82 on 04/19/2023, on atorvastatin , which is well-tolerated without apparent side effects.  The patient follows a low-cholesterol, low-fat diet.  Past Medical and Surgical History  Past Medical History Past Medical History:  Diagnosis Date  . Allergic rhinitis   . Anemia   . Breast cancer (CMS/HHS-HCC) 1996   s/p left breast lumpectomy (lymph node dissection- 2/11 positive)  . Breast cancer (CMS/HHS-HCC) 1995   right breast lumpectomy  . Diabetes mellitus (CMS/HHS-HCC)   . Dysphagia   . GERD (gastroesophageal reflux disease)   . Goiter with hyperthyroidism    multinodular goiter  . H/O ulcer disease   . Hypercholesterolemia   . Nephrolithiasis   . Scoliosis   . Skin cancer   . SVT (supraventricular tachycardia) (HHS-HCC)   . Thrombocytosis     Past Surgical History She has a past surgical history that includes breast lumpectomy with axillary lymph node dissection (Left, 1996); Abdominal hysterectomy; lumbar laminectomy L5-S1; Breast excisional biopsy (Left, 09/2012); Breast Lumpectomy (Left, 1996); Breast Lumpectomy (Right, 1995); and Mastectomy partial / lumpectomy (Left).   Medications and Allergies  Current Medications  Current Outpatient Medications  Medication Sig Dispense Refill  . albuterol  MDI, PROVENTIL , VENTOLIN , PROAIR , HFA 90 mcg/actuation inhaler 1-2 Puffs, every 4 hours as needed for shortness  of breath and prior to exertion. Up to 8 puffs (Max) for 24 hours. 1 each 0  . aspirin  81 MG EC tablet Take 162 mg by mouth once  daily 2 tabs qhs    . atorvastatin  (LIPITOR) 20 MG tablet TAKE ONE TABLET BY MOUTH EVERY EVENING 90 tablet 1  . biotin 5 mg capsule Take by mouth    . blood glucose diagnostic (ACCU-CHEK GUIDE TEST STRIPS) test strip 1 each (1 strip total) 3 (three) times daily Use as instructed. 100 each 12  . blood glucose meter kit Use as directed    . blood glucose meter kit as directed 1 each 0  . diclofenac (VOLTAREN) 50 MG EC tablet Take 1 tablet (50 mg total) by mouth 2 (two) times daily as needed (for pain) 60 tablet 2  . docosahexaenoic acid/epa (FISH OIL ORAL) Take by mouth    . escitalopram  oxalate (LEXAPRO ) 20 MG tablet TAKE ONE TABLET BY MOUTH DAILY 90 tablet 1  . fluticasone  propion-salmeteroL (ADVAIR DISKUS) 250-50 mcg/dose diskus inhaler Inhale 1 Puff into the lungs every 12 (twelve) hours 60 each 1  . Herbal Supplement Take 3 capsules by mouth once daily Herbal Name: Balance of Nature Fruit and Veggie Capsule Supplements    . inhalational spacer (AEROCHAMBER) spacer Use as instructed. 1 each 2  . lancets Use 1 each 3 (three) times daily Use as instructed. 100 each 12  . levothyroxine  (SYNTHROID ) 25 MCG tablet Take 1 tablet (25 mcg total) by mouth once daily Take on an empty stomach with a glass of water at least 30-60 minutes before breakfast. 90 tablet 3  . losartan  (COZAAR ) 100 MG tablet TAKE ONE TABLET BY MOUTH DAILY 90 tablet 1  . metFORMIN  (GLUCOPHAGE -XR) 500 MG XR tablet Take one tablet (500 mg) in the morning, and two tablets (1000 mg) in the evenings. 270 tablet 3  . metoprolol  SUCCinate (TOPROL -XL) 50 MG XL tablet TAKE ONE TABLET BY MOUTH DAILY 90 tablet 1  . omeprazole  (PRILOSEC) 40 MG DR capsule TAKE ONE CAPSULE BY MOUTH DAILY 90 capsule 1  . turmeric-herbal complex no.278 150 mg Cap Take by mouth     No current facility-administered medications for this visit.    Allergies: Patient has no known allergies.  Social and Family History  Social History  reports that she quit  smoking about 20 years ago. Her smoking use included cigarettes. She started smoking about 64 years ago. She has a 22.4 pack-year smoking history. She has never used smokeless tobacco. She reports that she does not drink alcohol and does not use drugs.  Family History Family History  Problem Relation Name Age of Onset  . Alzheimer's disease Mother    . Diabetes type II Father    . Heart disease Father    . High blood pressure (Hypertension) Father    . Diabetes type II Sister    . Diabetes type II Sister    . Diabetes type II Sister    . Diabetes type II Sister      Review of Systems   Review of Systems: The patient denies chest pain, shortness of breath, orthopnea, paroxysmal nocturnal dyspnea, pedal edema, with occasional palpitations, without heart racing, presyncope, syncope, with left breast pain. Review of 8 Systems is negative except as described above.  Physical Examination   Vitals:BP 102/64   Pulse 60   Ht 162.6 cm (5' 4)   Wt 60.8 kg (134 lb)   SpO2 93%   BMI 23.00  kg/m  Ht:162.6 cm (5' 4) Wt:60.8 kg (134 lb) ADJ:Anib surface area is 1.66 meters squared. Body mass index is 23 kg/m.  General: Alert and oriented. Well-appearing. No acute distress. HEENT: Pupils equally reactive to light and accomodation    Neck: no JVD Lungs: Normal effort of breathing; clear to auscultation bilaterally; no wheezes, rales, rhonchi Heart: Regular rate and rhythm. No murmur, rub, or gallop Abdomen: soft nontender, nondistended Extremities: no cyanosis, clubbing, or edema Peripheral Pulses: 2+ radial  Skin: Warm, dry, no diaphoresis  Assessment   78 y.o. female with  1. Heart palpitations   2. Need for vaccination   3. Primary hypertension   4. SOB (shortness of breath)   5. Hypercholesterolemia    78 year old female with a history of hypertension, hyperlipidemia, and palpitations. She denies recent palpitations. She is on metoprolol  for hypertension. She reports a recent  history of left breast discomfort, worse with lifting heavy objects with no exertional component. She is status post left lumpectomy in the 1990s. Questionable scar tissue as cause of discomfort. Symptoms are not consistent with cardiac etiology.  Patient admitted 10/26/2023 with chest pain with atypical features and shortness of breath, with negative troponin x 2, normal Lexiscan  Myoview , and CT angiography 10/27/2022 revealing minimal stenosis left circumflex.  The patient reports progressive exertional dyspnea.  Chest CT 05/19/2024 revealed mild, bibasilar pulmonary fibrosis and enlarged main pulmonary artery consistent with pulmonary hypertension.  The patient has a follow-up with Pulmonary in the near future.   Plan   1.  Continue current medications 2.  Counseled patient about low-sodium diet 3.  DASH diet printed instructions given to the patient 4.  Counseled patient about low-cholesterol diet 5.  Continue atorvastatin  for hyperlipidemia management 6.  Low-fat and cholesterol diet printed instructions given to the patient 7.  Consider right heart catheterization after follow-up with Pulmonary 8.  Return to clinic for follow-up in 3 months  No orders of the defined types were placed in this encounter.   Return in about 4 months (around 10/07/2024).   Michelle DOOMS, MD PhD [facility]

## 2024-06-10 ENCOUNTER — Other Ambulatory Visit: Payer: Self-pay

## 2024-06-10 ENCOUNTER — Inpatient Hospital Stay
Admission: EM | Admit: 2024-06-10 | Discharge: 2024-06-16 | DRG: 286 | Disposition: A | Discharge status: Home / Self Care | Attending: Internal Medicine | Admitting: Internal Medicine

## 2024-06-10 ENCOUNTER — Emergency Department

## 2024-06-10 DIAGNOSIS — I509 Heart failure, unspecified: Secondary | ICD-10-CM

## 2024-06-10 DIAGNOSIS — Z853 Personal history of malignant neoplasm of breast: Secondary | ICD-10-CM

## 2024-06-10 DIAGNOSIS — J449 Chronic obstructive pulmonary disease, unspecified: Secondary | ICD-10-CM | POA: Insufficient documentation

## 2024-06-10 DIAGNOSIS — E1169 Type 2 diabetes mellitus with other specified complication: Secondary | ICD-10-CM

## 2024-06-10 DIAGNOSIS — I498 Other specified cardiac arrhythmias: Secondary | ICD-10-CM | POA: Insufficient documentation

## 2024-06-10 DIAGNOSIS — R590 Localized enlarged lymph nodes: Secondary | ICD-10-CM

## 2024-06-10 DIAGNOSIS — R079 Chest pain, unspecified: Principal | ICD-10-CM | POA: Diagnosis present

## 2024-06-10 DIAGNOSIS — D696 Thrombocytopenia, unspecified: Secondary | ICD-10-CM

## 2024-06-10 DIAGNOSIS — R0600 Dyspnea, unspecified: Secondary | ICD-10-CM

## 2024-06-10 DIAGNOSIS — E119 Type 2 diabetes mellitus without complications: Secondary | ICD-10-CM

## 2024-06-10 NOTE — ED Triage Notes (Signed)
 Pt endorses onset of chest pain while at rest/sitting tonight. Pain in the central chest area. .Reports pressure in her chest, and hard to breath. Has had a lot of coughing today. Chronic 2 liters

## 2024-06-11 ENCOUNTER — Emergency Department

## 2024-06-11 ENCOUNTER — Inpatient Hospital Stay: Admit: 2024-06-11 | Discharge: 2024-06-11 | Disposition: A | Attending: Internal Medicine

## 2024-06-11 DIAGNOSIS — I498 Other specified cardiac arrhythmias: Secondary | ICD-10-CM | POA: Insufficient documentation

## 2024-06-11 DIAGNOSIS — I5033 Acute on chronic diastolic (congestive) heart failure: Secondary | ICD-10-CM | POA: Diagnosis not present

## 2024-06-11 DIAGNOSIS — D696 Thrombocytopenia, unspecified: Secondary | ICD-10-CM

## 2024-06-11 DIAGNOSIS — R079 Chest pain, unspecified: Secondary | ICD-10-CM

## 2024-06-11 DIAGNOSIS — R590 Localized enlarged lymph nodes: Secondary | ICD-10-CM

## 2024-06-11 DIAGNOSIS — J449 Chronic obstructive pulmonary disease, unspecified: Secondary | ICD-10-CM | POA: Insufficient documentation

## 2024-06-11 DIAGNOSIS — I509 Heart failure, unspecified: Secondary | ICD-10-CM

## 2024-06-11 LAB — CBC WITH DIFFERENTIAL/PLATELET
Abs Immature Granulocytes: 0.08 K/uL — ABNORMAL HIGH (ref 0.00–0.07)
Basophils Absolute: 0.1 K/uL (ref 0.0–0.1)
Basophils Relative: 1 %
Eosinophils Absolute: 0.1 K/uL (ref 0.0–0.5)
Eosinophils Relative: 0 %
HCT: 37.8 % (ref 36.0–46.0)
Hemoglobin: 12 g/dL (ref 12.0–15.0)
Immature Granulocytes: 1 %
Lymphocytes Relative: 10 %
Lymphs Abs: 1.6 K/uL (ref 0.7–4.0)
MCH: 28.4 pg (ref 26.0–34.0)
MCHC: 31.7 g/dL (ref 30.0–36.0)
MCV: 89.4 fL (ref 80.0–100.0)
Monocytes Absolute: 1.6 K/uL — ABNORMAL HIGH (ref 0.1–1.0)
Monocytes Relative: 11 %
Neutro Abs: 11.7 K/uL — ABNORMAL HIGH (ref 1.7–7.7)
Neutrophils Relative %: 77 %
Platelets: 123 K/uL — ABNORMAL LOW (ref 150–400)
RBC: 4.23 MIL/uL (ref 3.87–5.11)
RDW: 15.9 % — ABNORMAL HIGH (ref 11.5–15.5)
Smear Review: NORMAL
WBC: 15.1 K/uL — ABNORMAL HIGH (ref 4.0–10.5)
nRBC: 0 % (ref 0.0–0.2)

## 2024-06-11 LAB — RESP PANEL BY RT-PCR (RSV, FLU A&B, COVID)  RVPGX2
Influenza A by PCR: NEGATIVE
Influenza B by PCR: NEGATIVE
Resp Syncytial Virus by PCR: NEGATIVE
SARS Coronavirus 2 by RT PCR: NEGATIVE

## 2024-06-11 LAB — ECHOCARDIOGRAM COMPLETE
AR max vel: 2.23 cm2
AV Area VTI: 1.91 cm2
AV Area mean vel: 2.11 cm2
AV Mean grad: 2 mmHg
AV Peak grad: 4.5 mmHg
Ao pk vel: 1.06 m/s
Area-P 1/2: 2.54 cm2
Calc EF: 65.5 %
Height: 64 in
MV VTI: 1.5 cm2
S' Lateral: 2 cm
Single Plane A2C EF: 65.6 %
Single Plane A4C EF: 66.3 %
Weight: 2112 [oz_av]

## 2024-06-11 LAB — TECHNOLOGIST SMEAR REVIEW: Plt Morphology: NORMAL

## 2024-06-11 LAB — URINALYSIS, ROUTINE W REFLEX MICROSCOPIC
Bilirubin Urine: NEGATIVE
Glucose, UA: NEGATIVE mg/dL
Hgb urine dipstick: NEGATIVE
Ketones, ur: NEGATIVE mg/dL
Leukocytes,Ua: NEGATIVE
Nitrite: NEGATIVE
Protein, ur: NEGATIVE mg/dL
Specific Gravity, Urine: >1.046 — ABNORMAL HIGH (ref 1.005–1.030)
pH: 5 (ref 5.0–8.0)

## 2024-06-11 LAB — CBC
HCT: 40.9 % (ref 36.0–46.0)
Hemoglobin: 13.1 g/dL (ref 12.0–15.0)
MCH: 28.7 pg (ref 26.0–34.0)
MCHC: 32 g/dL (ref 30.0–36.0)
MCV: 89.5 fL (ref 80.0–100.0)
Platelets: 92 K/uL — ABNORMAL LOW (ref 150–400)
RBC: 4.57 MIL/uL (ref 3.87–5.11)
RDW: 16.1 % — ABNORMAL HIGH (ref 11.5–15.5)
WBC: 12.2 K/uL — ABNORMAL HIGH (ref 4.0–10.5)
nRBC: 0 % (ref 0.0–0.2)

## 2024-06-11 LAB — TROPONIN T, HIGH SENSITIVITY
Troponin T High Sensitivity: 17 ng/L (ref 0–19)
Troponin T High Sensitivity: 17 ng/L (ref 0–19)

## 2024-06-11 LAB — COMPREHENSIVE METABOLIC PANEL WITH GFR
ALT: 29 U/L (ref 0–44)
AST: 34 U/L (ref 15–41)
Albumin: 4.2 g/dL (ref 3.5–5.0)
Alkaline Phosphatase: 95 U/L (ref 38–126)
Anion gap: 11 (ref 5–15)
BUN: 19 mg/dL (ref 8–23)
CO2: 25 mmol/L (ref 22–32)
Calcium: 9.4 mg/dL (ref 8.9–10.3)
Chloride: 99 mmol/L (ref 98–111)
Creatinine, Ser: 1 mg/dL (ref 0.44–1.00)
GFR, Estimated: 57 mL/min — ABNORMAL LOW (ref >60)
Glucose, Bld: 117 mg/dL — ABNORMAL HIGH (ref 70–99)
Potassium: 4.4 mmol/L (ref 3.5–5.1)
Sodium: 136 mmol/L (ref 135–145)
Total Bilirubin: 0.5 mg/dL (ref 0.0–1.2)
Total Protein: 7.4 g/dL (ref 6.5–8.1)

## 2024-06-11 LAB — CBG MONITORING, ED
Glucose-Capillary: 176 mg/dL — ABNORMAL HIGH (ref 70–99)
Glucose-Capillary: 188 mg/dL — ABNORMAL HIGH (ref 70–99)
Glucose-Capillary: 127 mg/dL — ABNORMAL HIGH (ref 70–99)

## 2024-06-11 LAB — HEMOGLOBIN A1C
Hgb A1c MFr Bld: 6.8 % — ABNORMAL HIGH (ref 4.8–5.6)
Mean Plasma Glucose: 148 mg/dL

## 2024-06-11 LAB — GLUCOSE, CAPILLARY: Glucose-Capillary: 418 mg/dL — ABNORMAL HIGH (ref 70–99)

## 2024-06-11 LAB — PRO BRAIN NATRIURETIC PEPTIDE: Pro Brain Natriuretic Peptide: 3081 pg/mL — ABNORMAL HIGH (ref <300.0)

## 2024-06-11 MED ORDER — MORPHINE SULFATE (PF) 2 MG/ML IV SOLN
2.0000 mg | Freq: Once | INTRAVENOUS | Status: AC
  Administered 2024-06-11: 2 mg via INTRAVENOUS
  Filled 2024-06-11: qty 1

## 2024-06-11 MED ORDER — LEVOTHYROXINE SODIUM 25 MCG PO TABS
25.0000 ug | ORAL_TABLET | Freq: Every day | ORAL | Status: DC
  Administered 2024-06-11 – 2024-06-16 (×6): 25 ug via ORAL
  Filled 2024-06-11 (×6): qty 1

## 2024-06-11 MED ORDER — ATORVASTATIN CALCIUM 20 MG PO TABS
20.0000 mg | ORAL_TABLET | Freq: Every day | ORAL | Status: DC
  Administered 2024-06-11 – 2024-06-16 (×6): 20 mg via ORAL
  Filled 2024-06-11 (×6): qty 1

## 2024-06-11 MED ORDER — HYDROCODONE-ACETAMINOPHEN 5-325 MG PO TABS: 1.0000 | ORAL_TABLET | ORAL | Status: DC | PRN

## 2024-06-11 MED ORDER — ACETAMINOPHEN 325 MG PO TABS
650.0000 mg | ORAL_TABLET | Freq: Four times a day (QID) | ORAL | Status: DC | PRN
  Administered 2024-06-11 – 2024-06-14 (×5): 650 mg via ORAL
  Filled 2024-06-11 (×6): qty 2

## 2024-06-11 MED ORDER — PERFLUTREN LIPID MICROSPHERE
1.0000 mL | INTRAVENOUS | Status: AC | PRN
  Administered 2024-06-11: 2 mL via INTRAVENOUS

## 2024-06-11 MED ORDER — ONDANSETRON HCL 4 MG/2ML IJ SOLN: 4.0000 mg | Freq: Four times a day (QID) | INTRAMUSCULAR | Status: DC | PRN

## 2024-06-11 MED ORDER — MORPHINE SULFATE (PF) 2 MG/ML IV SOLN: 2.0000 mg | INTRAVENOUS | Status: DC | PRN

## 2024-06-11 MED ORDER — ACETAMINOPHEN 650 MG RE SUPP: 650.0000 mg | Freq: Four times a day (QID) | RECTAL | Status: DC | PRN

## 2024-06-11 MED ORDER — IOHEXOL 350 MG/ML SOLN
75.0000 mL | Freq: Once | INTRAVENOUS | Status: AC | PRN
  Administered 2024-06-11: 75 mL via INTRAVENOUS

## 2024-06-11 MED ORDER — ONDANSETRON HCL 4 MG PO TABS: 4.0000 mg | ORAL_TABLET | Freq: Four times a day (QID) | ORAL | Status: DC | PRN

## 2024-06-11 MED ORDER — ISOSORBIDE MONONITRATE ER 30 MG PO TB24
30.0000 mg | ORAL_TABLET | Freq: Every day | ORAL | Status: DC
  Administered 2024-06-12 – 2024-06-16 (×5): 30 mg via ORAL
  Filled 2024-06-11 (×6): qty 1

## 2024-06-11 MED ORDER — INSULIN ASPART 100 UNIT/ML IJ SOLN
0.0000 [IU] | Freq: Three times a day (TID) | INTRAMUSCULAR | Status: DC
  Administered 2024-06-11: 1 [IU] via SUBCUTANEOUS
  Administered 2024-06-11 – 2024-06-12 (×4): 2 [IU] via SUBCUTANEOUS
  Administered 2024-06-12 – 2024-06-13 (×2): 1 [IU] via SUBCUTANEOUS
  Administered 2024-06-13: 5 [IU] via SUBCUTANEOUS
  Administered 2024-06-13: 2 [IU] via SUBCUTANEOUS
  Administered 2024-06-14: 3 [IU] via SUBCUTANEOUS
  Administered 2024-06-14: 9 [IU] via SUBCUTANEOUS
  Administered 2024-06-14: 7 [IU] via SUBCUTANEOUS
  Filled 2024-06-11: qty 7
  Filled 2024-06-11 (×2): qty 2
  Filled 2024-06-11: qty 1
  Filled 2024-06-11: qty 5
  Filled 2024-06-11 (×2): qty 1
  Filled 2024-06-11: qty 2
  Filled 2024-06-11: qty 3
  Filled 2024-06-11: qty 2
  Filled 2024-06-11: qty 9
  Filled 2024-06-11: qty 2

## 2024-06-11 MED ORDER — INSULIN ASPART 100 UNIT/ML IJ SOLN
0.0000 [IU] | Freq: Every day | INTRAMUSCULAR | Status: DC
  Administered 2024-06-12 – 2024-06-13 (×2): 5 [IU] via SUBCUTANEOUS
  Filled 2024-06-11 (×2): qty 5

## 2024-06-11 MED ORDER — ACETAMINOPHEN 500 MG PO TABS
1000.0000 mg | ORAL_TABLET | Freq: Once | ORAL | Status: AC
  Administered 2024-06-11: 1000 mg via ORAL
  Filled 2024-06-11: qty 2

## 2024-06-11 MED ORDER — ALBUTEROL SULFATE (2.5 MG/3ML) 0.083% IN NEBU: 2.5000 mg | INHALATION_SOLUTION | RESPIRATORY_TRACT | Status: DC | PRN

## 2024-06-11 MED ORDER — FUROSEMIDE 10 MG/ML IJ SOLN
20.0000 mg | Freq: Two times a day (BID) | INTRAMUSCULAR | Status: DC
  Administered 2024-06-11 – 2024-06-14 (×7): 20 mg via INTRAVENOUS
  Filled 2024-06-11 (×2): qty 2
  Filled 2024-06-11: qty 4
  Filled 2024-06-11 (×2): qty 2
  Filled 2024-06-11: qty 4
  Filled 2024-06-11: qty 2

## 2024-06-11 MED ORDER — METHYLPREDNISOLONE SODIUM SUCC 40 MG IJ SOLR
40.0000 mg | INTRAMUSCULAR | Status: DC
  Administered 2024-06-11: 40 mg via INTRAVENOUS
  Filled 2024-06-11: qty 1

## 2024-06-11 MED ORDER — MIDODRINE HCL 5 MG PO TABS
5.0000 mg | ORAL_TABLET | Freq: Three times a day (TID) | ORAL | Status: DC
  Administered 2024-06-11 – 2024-06-13 (×5): 5 mg via ORAL
  Filled 2024-06-11 (×5): qty 1

## 2024-06-11 MED ORDER — METOPROLOL SUCCINATE ER 25 MG PO TB24
25.0000 mg | ORAL_TABLET | Freq: Every day | ORAL | Status: DC
  Filled 2024-06-11 (×2): qty 1

## 2024-06-11 MED ORDER — LOSARTAN POTASSIUM 50 MG PO TABS
100.0000 mg | ORAL_TABLET | Freq: Every day | ORAL | Status: DC
  Administered 2024-06-13 – 2024-06-14 (×2): 100 mg via ORAL
  Filled 2024-06-11 (×4): qty 2

## 2024-06-11 NOTE — Assessment & Plan Note (Addendum)
 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

## 2024-06-11 NOTE — ED Notes (Signed)
 CCMD called to initiate cardiac monitoring.

## 2024-06-11 NOTE — ED Notes (Signed)
 NURSE ASHLEY RN INFORMED OF BED ASSIGNED

## 2024-06-11 NOTE — Assessment & Plan Note (Addendum)
 Pulmonary hypertension Patient is followed by pulmonology--due to follow-up December 18 Can consider inpatient consult

## 2024-06-11 NOTE — Hospital Course (Signed)
 SABRA

## 2024-06-11 NOTE — H&P (Addendum)
 History and Physical    Patient: Michelle Hawkins FMW:[id number] DOB: 05-02-1946 DOA: 06/10/2024 DOS: the patient was seen and examined on 06/11/2024 PCP: Sherial Bail, MD  Patient coming from: Home  Chief Complaint: No chief complaint on file.   HPI: Michelle Hawkins is a 78 y.o. female with medical history significant for DM, HTN, COPD, former smoker, breast cancer s/p chemoradiation, palpitations and exertional dyspnea, being worked up by pulmonology (prior negative cardiac workup)-prior CT chest with bibasilar pulmonary fibrosis/mediastinal adenopathy/pulmonary hypertension who is being admitted with substernal chest pain and possible new onset CHF.  Patient was in her usual state of health until the night of arrival when she developed central pressure-like chest pain while at rest associated with shortness of breath.  Denies nausea, vomiting or diaphoresis.  Denies palpitations or lightheadedness.  She denies cough, wheezing, lower extremity pain.  Has had no fever or chills. Of note, patient was seen by cardiology on 12/1 and had a negative stress test earlier this year in April, EF 70%, with a minimal nonobstructive CAD on coronary CTA. In the ED, vitals within normal limits.  Labs notable for troponin 17 and proBNP 3081.  CBC notable for mild leukocytosis of 12.2 and new thrombocytopenia of 92, down from 393 a couple months prior CMP unremarkable.  Respiratory viral panel negative for COVID flu and RSV EKG showed sinus rhythm at 64 with borderline T wave abnormalities Patient treated with morphine with improvement in pain CTA chest negative for PE showed cardiomegaly and mild alveolar pulmonary edema among other findings as well as stable mediastinal and hilar adenopathy-please see formal report  Patient treated with morphine with improvement in pain Admission requested   Addendum: Following admission, patient developed chest pain as she got up to use the bathroom, ventricular trigeminy  noted on the monitor-strip in epic    Past Medical History:  Diagnosis Date   Allergic rhinitis    Anxiety    Breast cancer (HCC) 1996   s/p left breast lumpectomy (lymph node dissection - 2/11 positive), chemotherapy   Breast cancer (HCC) 1995   right breast lumpectomy   Complication of anesthesia    slow to wake   Depression    Diabetes mellitus    Dysphagia    Family history of adverse reaction to anesthesia    Mother - slow to wake   GERD (gastroesophageal reflux disease)    H/O ulcer disease    PUD   Hypercholesterolemia    Hypertension    Hypothyroidism    multinodular goiter   Nephrolithiasis    Personal history of chemotherapy    Personal history of radiation therapy    Scoliosis    Skin cancer    SVT (supraventricular tachycardia)    Past Surgical History:  Procedure Laterality Date   ABDOMINAL HYSTERECTOMY     partial   BREAST BIOPSY Left 09/2012   benign   BREAST EXCISIONAL BIOPSY Right    1995 benign   BREAST LUMPECTOMY Left 1996   lumpectomy with chemo and rad tx for breast ca (lymph node dissection - 2/11 positive   BREAST LUMPECTOMY WITH AXILLARY LYMPH NODE DISSECTION  1996   left   CATARACT EXTRACTION W/PHACO Left 11/29/2020   Procedure: CATARACT EXTRACTION PHACO AND INTRAOCULAR LENS PLACEMENT (IOC) LEFT DIABETIC VIVITY TORIC;  Surgeon: Jaye Fallow, MD;  Location: [facility];  Service: Ophthalmology;  Laterality: Left;  Diabetic - oral meds 6.88 00:40.7   CATARACT EXTRACTION W/PHACO Right 12/13/2020   Procedure:  CATARACT EXTRACTION PHACO AND INTRAOCULAR LENS PLACEMENT (IOC) RIGHT DIABETIC VIVITY TORIC 5.44 00:40.3;  Surgeon: Jaye Fallow, MD;  Location: [facility];  Service: Ophthalmology;  Laterality: Right;  Diabetic - oral meds   COLONOSCOPY WITH PROPOFOL  N/A 06/03/2019   Procedure: COLONOSCOPY WITH PROPOFOL ;  Surgeon: Dessa Reyes ORN, MD;  Location: [facility];  Service: Endoscopy;  Laterality: N/A;    ESOPHAGOGASTRODUODENOSCOPY N/A 06/03/2019   Procedure: ESOPHAGOGASTRODUODENOSCOPY (EGD);  Surgeon: Dessa Reyes ORN, MD;  Location: [facility] END[facility]COPY;  Service: Endoscopy;  Laterality: N/A;   LUMBAR LAMINECTOMY  09/2005   L5-S1   Social History:  reports that she quit smoking about 20 years ago. Her smoking use included cigarettes. She started smoking about 60 years ago. She has a 20 pack-year smoking history. She has never used smokeless tobacco. She reports that she does not drink alcohol and does not use drugs.  No Known Allergies  Family History  Problem Relation Age of Onset   Alzheimer's disease Mother    Heart disease Father    Hypertension Father    Diabetes Father    Diabetes Sister    Diabetes Sister    Diabetes Sister    Diabetes Sister    Breast cancer Neg Hx     Prior to Admission medications   Medication Sig Start Date End Date Taking? Authorizing Provider  Accu-Chek Softclix Lancets lancets Check blood sugar twice daily Dx 250.00 06/28/20   Glendia Shad, MD  acetaminophen  (TYLENOL ) 500 MG tablet Take 500 mg by mouth every 6 (six) hours as needed for mild pain (pain score 1-3).    [provider]  aspirin  81 MG tablet Take 81 mg by mouth 2 (two) times daily.     [provider]  atorvastatin  (LIPITOR) 20 MG tablet TAKE ONE TABLET EVERY DAY 02/12/20   Glendia Shad, MD  Biotin 5 MG CAPS Take by mouth.    [provider]  Blood Glucose Monitoring Suppl (ONE TOUCH ULTRA SYSTEM KIT) w/Device KIT 1 kit by Does not apply route once.    [provider]  calcium  citrate (CALCITRATE - DOSED IN MG ELEMENTAL CALCIUM ) 950 MG tablet Take 200 mg of elemental calcium  by mouth daily.    [provider]  cholecalciferol (VITAMIN D) 400 UNITS TABS Take by mouth.    [provider]  Continuous Blood Gluc Receiver (FREESTYLE LIBRE 14 DAY READER) DEVI Inject 1 Device into the skin every 14 (fourteen) days. Place 1 sensor every 14 days.  Use to check sugar at least 4 times daily 08/17/19   Glendia Shad, MD  Continuous Blood Gluc Sensor (FREESTYLE LIBRE 14 DAY SENSOR) MISC Inject 1 Device into the skin every 14 (fourteen) days. 08/17/19   Glendia Shad, MD  cyclobenzaprine (FLEXERIL) 10 MG tablet Take 10 mg by mouth 2 (two) times daily as needed. 11/21/23   [provider]  escitalopram  (LEXAPRO ) 20 MG tablet TAKE ONE TABLET EVERY DAY 04/01/20   Glendia Shad, MD  ferrous sulfate 325 (65 FE) MG EC tablet Take 325 mg by mouth daily with breakfast.    [provider]  fish oil-omega-3 fatty acids 1000 MG capsule Take 2 g by mouth daily.    [provider]  glucose blood (ONE TOUCH ULTRA TEST) test strip CHECK BLOOD SUGAR UP TO THREE TIMES DAILY Dx E11.9 06/28/20   Glendia Shad, MD  isosorbide  mononitrate (IMDUR ) 30 MG 24 hr tablet Take 1 tablet (30 mg total) by mouth daily. 10/30/23  Darci Pore, MD  levothyroxine  (SYNTHROID ) 25 MCG tablet TAKE 1 TABLET EVERY DAY ON EMPTY STOMACHWITH A GLASS OF WATER AT LEAST 30-60 MINBEFORE BREAKFAST 06/13/20   Glendia Shad, MD  losartan  (COZAAR ) 100 MG tablet TAKE 1 TABLET BY MOUTH DAILY 05/10/20   Glendia Shad, MD  metFORMIN  (GLUCOPHAGE -XR) 500 MG 24 hr tablet TAKE TWO TABLETS TWICE DAILY Patient taking differently: Take 500-1,000 mg by mouth 2 (two) times daily. 500mg  qam and 1,000mg  qpm 06/01/19   Glendia Shad, MD  metoprolol  succinate (TOPROL -XL) 50 MG 24 hr tablet TAKE 1 TABLET BY MOUTH DAILY WITH OR FOLLOWING A MEAL 06/13/20   Glendia Shad, MD  Multiple Vitamin (MULTIVITAMIN WITH MINERALS) TABS tablet Take 1 tablet by mouth daily.    [provider]  omeprazole  (PRILOSEC) 40 MG capsule TAKE 1 CAPSULE EVERY DAY 04/01/20   Glendia Shad, MD  Probiotic Product (PROBIOTIC DAILY PO) Take 1 tablet by mouth.    [provider]  TURMERIC PO Take by mouth daily.     [provider]    Physical Exam: Vitals:   06/10/24 2310  06/11/24 0400 06/11/24 0404  BP: (!) 153/98 108/68   Pulse: 62 68   Resp: 16 20   Temp: 97.9 F (36.6 C)  98 F (36.7 C)  TempSrc: Oral  Oral  SpO2: 95% 94%   Weight: 59.9 kg    Height: 5' 4 (1.626 m)     Physical Exam Vitals and nursing note reviewed.  Constitutional:      General: She is not in acute distress.    Comments: Patient somewhat uncomfortable from pain, hand on chest but does not appear distressed and able to converse  HENT:     Head: Normocephalic and atraumatic.  Cardiovascular:     Rate and Rhythm: Normal rate and regular rhythm.     Heart sounds: Normal heart sounds.  Pulmonary:     Effort: Pulmonary effort is normal.     Breath sounds: Normal breath sounds.  Abdominal:     Palpations: Abdomen is soft.     Tenderness: There is no abdominal tenderness.  Neurological:     Mental Status: Mental status is at baseline.     Labs on Admission: I have personally reviewed following labs and imaging studies  CBC: Recent Labs  Lab 06/11/24 0012  WBC 12.2*  HGB 13.1  HCT 40.9  MCV 89.5  PLT 92*   Basic Metabolic Panel: Recent Labs  Lab 06/11/24 0012  NA 136  K 4.4  CL 99  CO2 25  GLUCOSE 117*  BUN 19  CREATININE 1.00  CALCIUM  9.4   GFR: Estimated Creatinine Clearance: 39.4 mL/min (by C-G formula based on SCr of 1 mg/dL). Liver Function Tests: Recent Labs  Lab 06/11/24 0012  AST 34  ALT 29  ALKPHOS 95  BILITOT 0.5  PROT 7.4  ALBUMIN  4.2   No results for input(s): LIPASE, AMYLASE in the last 168 hours. No results for input(s): AMMONIA in the last 168 hours. Coagulation Profile: No results for input(s): INR, PROTIME in the last 168 hours. Cardiac Enzymes: No results for input(s): CKTOTAL, CKMB, CKMBINDEX, TROPONINI in the last 168 hours. BNP (last 3 results) Recent Labs    06/11/24 0012  PROBNP 3,081.0*   HbA1C: No results for input(s): HGBA1C in the last 72 hours. CBG: No results for input(s): GLUCAP in  the last 168 hours. Lipid Profile: No results for input(s): CHOL, HDL, LDLCALC, TRIG, CHOLHDL, LDLDIRECT in the last 72  hours. Thyroid  Function Tests: No results for input(s): TSH, T4TOTAL, FREET4, T3FREE, THYROIDAB in the last 72 hours. Anemia Panel: No results for input(s): VITAMINB12, FOLATE, FERRITIN, TIBC, IRON , RETICCTPCT in the last 72 hours. Urine analysis: No results found for: COLORURINE, APPEARANCEUR, LABSPEC, PHURINE, GLUCOSEU, HGBUR, BILIRUBINUR, KETONESUR, PROTEINUR, UROBILINOGEN, NITRITE, LEUKOCYTESUR  Radiological Exams on Admission: CT Angio Chest Pulmonary Embolism (PE) W or WO Contrast Result Date: 06/11/2024 EXAM: CTA of the Chest with contrast for PE 06/11/2024 01:13:52 AM TECHNIQUE: CTA of the chest was performed after the administration of 75 mL of iohexol  (OMNIPAQUE ) 350 MG/ML injection. Multiplanar reformatted images are provided for review. MIP images are provided for review. Automated exposure control, iterative reconstruction, and/or weight based adjustment of the mA/kV was utilized to reduce the radiation dose to as low as reasonably achievable. COMPARISON: Prior examination 12/2023 and more remote prior examination of 10/26/2023. CLINICAL HISTORY: Pulmonary embolism (PE) suspected, high prob. Breast cancer. *tracking code: Bo* FINDINGS: PULMONARY ARTERIES: Pulmonary arteries are adequately opacified for evaluation. No pulmonary embolism. The central pulmonary arteries are enlarged in keeping with changes of pulmonary arterial hypertension. Main pulmonary artery is normal in caliber. MEDIASTINUM: Mild coronary artery calcification. Mild cardiomegaly with asymmetric enlargement of the right heart chambers in keeping with elevated right heart pressure. No pericardial effusion. Mild atherosclerotic calcifications within the thoracic aorta. No aortic aneurysm. LYMPH NODES: Pathologic mediastinal and bilateral hilar  adenopathy is present with the index lymph node measuring 17 mm in short axis diameter within the subcarinal lymph node group and 18 mm in diameter within the right hilar lymph node groove (54/4). These appear stable since the prior examination 12/2023 and more remote prior examination of 10/26/2023 possible reflecting the sequela of treated disease, and inflammatory processes such as sarcoidosis, or a low-grade lymphoproliferative process. LUNGS AND PLEURA: Mild diffuse ground-glass pulmonary opacity is again seen demonstrating a basilar predominance, progressive since prior examination and most suggestive of mild alveolar pulmonary edema. No pneumothorax or pleural effusion. UPPER ABDOMEN: Limited images of the upper abdomen are unremarkable. SOFT TISSUES AND BONES: No acute bone or soft tissue abnormality. IMPRESSION: 1. No pulmonary embolism. 2. Cardiomegaly with asymmetric enlargement of the right heart chambers been with elevated right heart pressure. 3. Morphologic changes in keeping with pulmonary arterial hypertension. 4. Mild diffuse ground-glass pulmonary opacity with basilar predominance, progressive since prior examination, most suggestive of mild alveolar pulmonary edema. 5. Pathologic mediastinal and bilateral hilar adenopathy, stable since prior examination, possibly reflecting sequela of treated disease, inflammatory processes such as sarcoidosis, or a low-grade lymphoproliferative process. Electronically signed by: Dorethia Molt MD 06/11/2024 01:25 AM EST RP Workstation: [id number]   DG Chest 2 View Result Date: 06/11/2024 EXAM: 2 VIEW(S) XRAY OF THE CHEST 06/10/2024 11:56:00 PM COMPARISON: 05/26/2024 CLINICAL HISTORY: cough, chest pain FINDINGS: LUNGS AND PLEURA: No focal pulmonary opacity. No pleural effusion. No pneumothorax. HEART AND MEDIASTINUM: No acute abnormality of the cardiac and mediastinal silhouettes. BONES AND SOFT TISSUES: Left axillary surgical clips noted. Thoracolumbar  scoliosis stable. No acute osseous abnormality. IMPRESSION: 1. No acute cardiopulmonary process. Electronically signed by: Norman Gatlin MD 06/11/2024 12:05 AM EST RP Workstation: [id number]   Data Reviewed for HPI: Relevant notes from primary care and specialist visits, past discharge summaries as available in EHR, including Care Everywhere. Prior diagnostic testing as pertinent to current admission diagnoses Updated medications and problem lists for reconciliation ED course, including vitals, labs, imaging, treatment and response to treatment Triage notes, nursing and pharmacy notes and ED provider's notes Notable  results as noted above in HPI      Assessment and Plan: * Chest pain Nonobstructive CAD on coronary CTA 10/2023(negative stress test 10/2023) Suspect noncardiac given reassuring workup April 2025  CTA chest negative for PE-but showing mediastinal lymphadenopathy Pain control Can consider cardiology /pulmonology consult Addendum: Ventricular trigeminy noted during an episode of chest pain following admission - Will get cardiology consult -Continue home metoprolol  -Will get troponins  Possible new onset CHF (congestive heart failure) (HCC) Patient presents with exertional shortness of breath, elevated proBNP, mild pulmonary edema on CTA chest Appears clinically euvolemic Will trial low-dose Lasix Continue  metoprolol .  Holding losartan  and Imdur  due to borderline soft blood pressure, SBP 108  Thrombocytopenia, new Mild leukocytosis Platelet 92,000, down from 393,000 a couple months prior.  WBC 12.2 Etiology uncertain We will get a peripheral smear SCD for DVT prophylaxis Can consider hematology consult  Mediastinal lymphadenopathy Pulmonary hypertension Patient is followed by pulmonology--due to follow-up December 18 Can consider inpatient consult  COPD (chronic obstructive pulmonary disease) (HCC) Not acutely exacerbated Continue home inhalers.  DuoNebs as  needed  History of breast cancer s/p chemoradiation Acute issues not suspected at this time  Diabetes mellitus (HCC) Sliding scale insulin  coverage     DVT prophylaxis: SCD  Consults: Cardiology, Dr. Florencio  Advance Care Planning:   Code Status: Prior   Family Communication: none  Disposition Plan: Back to previous home environment  Severity of Illness: The appropriate patient status for this patient is INPATIENT. Inpatient status is judged to be reasonable and necessary in order to provide the required intensity of service to ensure the patient's safety. The patient's presenting symptoms, physical exam findings, and initial radiographic and laboratory data in the context of their chronic comorbidities is felt to place them at high risk for further clinical deterioration. Furthermore, it is not anticipated that the patient will be medically stable for discharge from the hospital within 2 midnights of admission.   * I certify that at the point of admission it is my clinical judgment that the patient will require inpatient hospital care spanning beyond 2 midnights from the point of admission due to high intensity of service, high risk for further deterioration and high frequency of surveillance required.*  Author: Delayne LULLA Solian, MD 06/11/2024 4:38 AM  For on call review [email or web address].

## 2024-06-11 NOTE — Assessment & Plan Note (Signed)
 Acute issues not suspected at this time

## 2024-06-11 NOTE — Assessment & Plan Note (Signed)
 Sliding scale insulin  coverage

## 2024-06-11 NOTE — Hospital Course (Signed)
 Same day note  Michelle Hawkins is a 78 y.o. female with medical history significant for diabetes mellitus type 2, hypertension, COPD, former smoker, history of breast cancer status post chemoradiation, exertional dyspnea being worked up by pulmonary as outpatient with prior CT showing pulmonary fibrosis mediastinal adenopathy and pulmonary hypertension presented to hospital with substernal chest pain  with shortness of breath. Of note, patient was seen by cardiology on 12/1 and had a negative stress test earlier this year in April, EF 70%, with a minimal nonobstructive CAD on coronary CTA.  In the ED vitals were stable.  Troponins were negative.  proBNP elevated at 3081. CBC notable for mild leukocytosis of 12.2 and new thrombocytopenia of 92.  Respiratory viral panel was negative for COVID influenza and RSV.  EKG showed normal sinus rhythm with borderline T wave abnormalities.  CTA of the chest was negative for PE but showed cardiomegaly and alveolar pulmonary edema with midsternal hilar lymphadenopathy. Patient seen and examined at bedside.  Patient was admitted to the hospital for chest pain and shortness of breath  At the time of my evaluation, patient complains of  Physical examination reveals  Laboratory data and imaging was reviewed  Assessment and Plan.   Chest pain Nonobstructive CAD on coronary CTA 10/2023 (negative stress test 10/2023).  CTA of the chest was negative for PE but mediastinal lymphadenopathy.  Patient also had ventricular trigeminy.  Cardiology has been consulted.  Troponin 17 followed by 17 and not elevated.  Possible new onset CHF (congestive heart failure) Patient presenting with new exertional shortness of breath, elevated proBNP, mild pulmonary edema on CTA chest.  Trial of Lasix.  Continue metoprolol .  Losartan  and Imdur  on hold due to borderline low blood pressure.  Thrombocytopenia, new Mild leukocytosis Mild thrombocytopenia.  Peripheral blood smear with normal  platelet morphology   Mediastinal lymphadenopathy Pulmonary hypertension Seen by pulmonary as outpatient.   COPD (chronic obstructive pulmonary disease) Continue DuoNebs.   History of breast cancer s/p chemoradiation Stable at this time   Diabetes mellitus  Continue sliding scale insulin , Accu-Cheks, diabetic diet. No Charge  Signed,  Vernal Anselm Alstrom, MD [facility]

## 2024-06-11 NOTE — Assessment & Plan Note (Addendum)
 Patient presents with exertional shortness of breath, elevated proBNP, mild pulmonary edema on CTA chest Appears clinically euvolemic Will trial low-dose Lasix Continue  metoprolol .  Holding losartan  and Imdur  due to borderline soft blood pressure, SBP 108

## 2024-06-11 NOTE — ED Notes (Signed)
 Losartan , metoprolol , & isosorbide  held d/t BP of 94/60 and HR 54. Laxman Pokhrel, MD notified.

## 2024-06-11 NOTE — ED Provider Notes (Signed)
 [facility] Provider Note    Event Date/Time   First MD Initiated Contact with Patient 06/10/24 2324     (approximate)   History   No chief complaint on file.   HPI  Michelle Hawkins is a 78 y.o. female past medical history significant for heart palpitations, hyperlipidemia, diabetes, 40-pack-year smoking history, COPD on chronic 2 L of oxygen .  Hypertension, remote history of breast cancer, presents to the emergency department with chest pain.  Patient states that she had a sudden onset of chest pain tonight.  Feels like she is having a sharp stabbing pain and pressure sensation to her chest.  Associated with some shortness of breath.  Denies nausea, vomiting or diaphoresis.  No similar episodes previously.  States that she has had a mild cough that started today.  No sputum production.  No fever or chills.  Denies any history of DVT or PE.  Remote history of breast cancer 29 years ago.  Has not noticed any swelling to her legs.  On chart review patient was just evaluated by her cardiologist Dr. Eugune shows on 06/08/2024.  Patient had a Lexi Myoview  scan 07/2023 that did not reveal any evidence of ischemia.  CT angiography April 2025 revealed minimal less than 25% stenosis of left circumflex.  CT scan of the chest with findings concerning for pulmonary fibrosis and a large main pulmonary artery concerning for pulmonary hypertension.  Recommended considering right heart catheterization after follow-up with pulmonology.     Physical Exam   Triage Vital Signs: ED Triage Vitals  Encounter Vitals Group     BP 06/10/24 2310 (!) 153/98     Girls Systolic BP Percentile --      Girls Diastolic BP Percentile --      Boys Systolic BP Percentile --      Boys Diastolic BP Percentile --      Pulse Rate 06/10/24 2310 62     Resp 06/10/24 2310 16     Temp 06/10/24 2310 97.9 F (36.6 C)     Temp Source 06/10/24 2310 Oral     SpO2 06/10/24 2310 95 %     Weight 06/10/24 2310  132 lb (59.9 kg)     Height 06/10/24 2310 5' 4 (1.626 m)     Head Circumference --      Peak Flow --      Pain Score 06/10/24 2313 10     Pain Loc --      Pain Education --      Exclude from Growth Chart --     Most recent vital signs: Vitals:   06/10/24 2310  BP: (!) 153/98  Pulse: 62  Resp: 16  Temp: 97.9 F (36.6 C)  SpO2: 95%    Physical Exam Constitutional:      Appearance: She is well-developed.  HENT:     Head: Atraumatic.  Eyes:     Extraocular Movements: Extraocular movements intact.     Conjunctiva/sclera: Conjunctivae normal.     Pupils: Pupils are equal, round, and reactive to light.  Cardiovascular:     Rate and Rhythm: Regular rhythm.  Pulmonary:     Effort: No respiratory distress.     Comments: 2 L nasal cannula at 95%.  Lungs are clear to auscultation with no focal rhonchi or rales.  No wheezing on exam.  Speaking in 3 word sentences. Abdominal:     General: There is no distension.     Tenderness: There is no  abdominal tenderness.  Musculoskeletal:        General: Normal range of motion.     Cervical back: Normal range of motion.     Right lower leg: No edema.     Left lower leg: No edema.  Skin:    General: Skin is warm.     Capillary Refill: Capillary refill takes less than 2 seconds.  Neurological:     General: No focal deficit present.     Mental Status: She is alert. Mental status is at baseline.  Psychiatric:        Mood and Affect: Mood normal.     IMPRESSION / MDM / ASSESSMENT AND PLAN / ED COURSE  I reviewed the triage vital signs and the nursing notes.  Differential diagnosis including pulmonary embolism, pulmonary hypertension, ACS, anemia, pneumonia, viral illness including COVID/influenza  Patient was given aspirin  and morphine for pain control  Afebrile, hemodynamically stable, 95% on her home 2 L of oxygen   EKG  I, Clotilda Punter, the attending physician, personally viewed and interpreted this ECG.  EKG was sinus  bradycardia with a heart rate of 57.  Nonspecific ST changes.  No significant ST elevation.  Normal intervals and no chamber enlargement.  No tachycardic or bradycardic dysrhythmias while on cardiac telemetry.  RADIOLOGY I independently reviewed imaging, my interpretation of imaging: Chest x-ray with no focal findings of pneumonia.  Read as no acute findings  LABS (all labs ordered are listed, but only abnormal results are displayed) Labs interpreted as -    Labs Reviewed  CBC - Abnormal; Notable for the following components:      Result Value   WBC 12.2 (*)    RDW 16.1 (*)    Platelets 92 (*)    All other components within normal limits  COMPREHENSIVE METABOLIC PANEL WITH GFR - Abnormal; Notable for the following components:   Glucose, Bld 117 (*)    GFR, Estimated 57 (*)    All other components within normal limits  PRO BRAIN NATRIURETIC PEPTIDE - Abnormal; Notable for the following components:   Pro Brain Natriuretic Peptide 3,081.0 (*)    All other components within normal limits  RESP PANEL BY RT-PCR (RSV, FLU A&B, COVID)  RVPGX2  TROPONIN T, HIGH SENSITIVITY  TROPONIN T, HIGH SENSITIVITY     MDM  Moderate risk Wells criteria plan for CTA for further evaluation for pulmonary embolism.  Plan for lab work  Clinical Course as of 06/11/24 0328  Thu Jun 11, 2024  0108 See the pain significantly improved after morphine and aspirin .  Ongoing pain but does not want anything else for pain at this time. [SM]  0326 On reevaluation states that her pain has returned and continues to have central chest pain that is worse with deep inspiration.  Continues to feel short of breath that is worse from her normal.  Patient does have findings of likely pulmonary hypertension on her CTA but no signs of pulmonary embolism.  Questionable area of pulmonary edema and does have a significantly elevated proBNP in the 3000's.  On chart review I do not see that she has had a formal recent  echocardiogram but EF on her nuclear med study earlier this year was 70%.  Stable lymphadenopathy noted on her CT scan.  Patient does not take home Lasix.  Given her ongoing chest pain and worsening shortness of breath concern for possible new onset heart failure.  Serial troponins have been negative.  Consulting hospitalist for shortness of breath  and ongoing chest pain. [SM]    Clinical Course User Index [SM] Suzanne Kirsch, MD     PROCEDURES:  Critical Care performed: No  Procedures  Patient's presentation is most consistent with acute presentation with potential threat to life or bodily function.   MEDICATIONS ORDERED IN ED: Medications  morphine (PF) 2 MG/ML injection 2 mg (2 mg Intravenous Given 06/11/24 0027)  iohexol  (OMNIPAQUE ) 350 MG/ML injection 75 mL (75 mLs Intravenous Contrast Given 06/11/24 0055)    FINAL CLINICAL IMPRESSION(S) / ED DIAGNOSES   Final diagnoses:  Chest pain, unspecified type  Dyspnea, unspecified type     Rx / DC Orders   ED Discharge Orders     None        Note:  This document was prepared using Dragon voice recognition software and may include unintentional dictation errors.   Suzanne Kirsch, MD 06/11/24 [phone]

## 2024-06-11 NOTE — Assessment & Plan Note (Addendum)
 Mild leukocytosis Platelet 92,000, down from 393,000 a couple months prior.  WBC 12.2 Etiology uncertain We will get a peripheral smear SCD for DVT prophylaxis Can consider hematology consult

## 2024-06-11 NOTE — Consult Note (Signed)
 PULMONOLOGY         Date: 06/11/2024,   MRN# [id number] FALAN HENSLER February 19, 1946     AdmissionWeight: 59.9 kg                 CurrentWeight: 59.9 kg  Referring provider: Dr Sonjia   CHIEF COMPLAINT:   Progressive dyspnea on exertion with chest pain   HISTORY OF PRESENT ILLNESS   This is a 78 year old female with a history of diabetes essential hypertension COPD was a previous smoker, history of breast cancer status post chemo and radiation therapy who was seen by me previously in pulmonology clinic for an outpatient.  She is presented to the emergency department with chest discomfort and dyspnea.  She had a CT chest in the past most recently May 14, 2024 which showed peripheral basilar predominant reticular and septal thickening with associated traction bronchiectasis minimal honeycombing.  There was no pleural effusion, pneumothorax, suspicious appearing nodules, residual scarring from left lumpectomy and axillary lymph node dissection.  Overall findings were suggestive of possible interstitial lung disease.  She also had a VQ scan performed November 18 which was negative for any acute pulmonary embolism or perfusion defect.  She did have matched decreased perfusion at both lung apices likely due to underlying emphysema.  On this admission she had a repeat CT with PE protocol with similar findings as before including mild cardiomegaly and asymmetric RV enlargement, mild hilar and mediastinal adenopathy which appears stable from June 2025 as well as diffuse ground glass opacification suggestive of alveolar pulmonary edema.  Her proBNP was over 3000 on this admission suggestive of cardiac etiology of interstitial pulmonary edema.  Her CMP is essentially unremarkable besides a mild decrement in GFR at 57.  Viral testing for flu, RSV and COVID-19 are negative.  PCCM consultation for further evaluation management.   PAST MEDICAL HISTORY   Past Medical History:  Diagnosis Date    Allergic rhinitis    Anxiety    Breast cancer (HCC) 1996   s/p left breast lumpectomy (lymph node dissection - 2/11 positive), chemotherapy   Breast cancer (HCC) 1995   right breast lumpectomy   Complication of anesthesia    slow to wake   Depression    Diabetes mellitus    Dysphagia    Family history of adverse reaction to anesthesia    Mother - slow to wake   GERD (gastroesophageal reflux disease)    H/O ulcer disease    PUD   Hypercholesterolemia    Hypertension    Hypothyroidism    multinodular goiter   Nephrolithiasis    Personal history of chemotherapy    Personal history of radiation therapy    Scoliosis    Skin cancer    SVT (supraventricular tachycardia)      SURGICAL HISTORY   Past Surgical History:  Procedure Laterality Date   ABDOMINAL HYSTERECTOMY     partial   BREAST BIOPSY Left 09/2012   benign   BREAST EXCISIONAL BIOPSY Right    1995 benign   BREAST LUMPECTOMY Left 1996   lumpectomy with chemo and rad tx for breast ca (lymph node dissection - 2/11 positive   BREAST LUMPECTOMY WITH AXILLARY LYMPH NODE DISSECTION  1996   left   CATARACT EXTRACTION W/PHACO Left 11/29/2020   Procedure: CATARACT EXTRACTION PHACO AND INTRAOCULAR LENS PLACEMENT (IOC) LEFT DIABETIC VIVITY TORIC;  Surgeon: Jaye Fallow, MD;  Location: [facility];  Service: Ophthalmology;  Laterality: Left;  Diabetic - oral  meds 6.88 00:40.7   CATARACT EXTRACTION W/PHACO Right 12/13/2020   Procedure: CATARACT EXTRACTION PHACO AND INTRAOCULAR LENS PLACEMENT (IOC) RIGHT DIABETIC VIVITY TORIC 5.44 00:40.3;  Surgeon: Jaye Fallow, MD;  Location: [facility];  Service: Ophthalmology;  Laterality: Right;  Diabetic - oral meds   COLONOSCOPY WITH PROPOFOL  N/A 06/03/2019   Procedure: COLONOSCOPY WITH PROPOFOL ;  Surgeon: Dessa Reyes ORN, MD;  Location: [facility];  Service: Endoscopy;  Laterality: N/A;   ESOPHAGOGASTRODUODENOSCOPY N/A 06/03/2019   Procedure:  ESOPHAGOGASTRODUODENOSCOPY (EGD);  Surgeon: Dessa Reyes ORN, MD;  Location: [facility]COPY;  Service: Endoscopy;  Laterality: N/A;   LUMBAR LAMINECTOMY  09/2005   L5-S1     FAMILY HISTORY   Family History  Problem Relation Age of Onset   Alzheimer's disease Mother    Heart disease Father    Hypertension Father    Diabetes Father    Diabetes Sister    Diabetes Sister    Diabetes Sister    Diabetes Sister    Breast cancer Neg Hx      SOCIAL HISTORY   Social History   Tobacco Use   Smoking status: Former    Current packs/day: 0.00    Average packs/day: 0.5 packs/day for 40.0 years (20.0 ttl pk-yrs)    Types: Cigarettes    Start date: 04/14/1964    Quit date: 04/14/2004    Years since quitting: 20.1   Smokeless tobacco: Never  Vaping Use   Vaping status: Never Used  Substance Use Topics   Alcohol use: No    Alcohol/week: 0.0 standard drinks of alcohol   Drug use: No     MEDICATIONS    Home Medication:  Current Outpatient Rx   Order #: [id number] Class: Historical Med   Order #: [id number] Class: Historical Med   Order #: [id number] Class: Historical Med   Order #: [id number] Class: Normal   Order #: [id number] Class: Historical Med   Order #: [id number] Class: Historical Med   Order #: [id number] Class: Historical Med   Order #: [id number] Class: Normal   Order #: [id number] Class: Historical Med   Order #: [id number] Class: Historical Med   Order #: [id number] Class: Normal   Order #: [id number] Class: Normal   Order #: [id number] Class: Normal   Order #: [id number] Class: Normal   Order #: [id number] Class: Historical Med   Order #: [id number] Class: Normal   Order #: [id number] Class: Historical Med   Order #: [id number] Class: Historical Med   Order #: [id number] Class: Normal   Order #: [id number] Class: Normal   Order #: [id number] Class: Historical Med   Order #: [id number] Class: Normal   Order #: [id number] Class: Normal   Order #: [id number] Class: Historical Med   Order #: [id number] Class:  Normal   Order #: [id number] Class: Historical Med    Current Medication:  Current Facility-Administered Medications:    acetaminophen  (TYLENOL ) tablet 650 mg, 650 mg, Oral, Q6H PRN, 650 mg at 06/11/24 1038 **OR** acetaminophen  (TYLENOL ) suppository 650 mg, 650 mg, Rectal, Q6H PRN, Cleatus Delayne GAILS, MD   albuterol  (PROVENTIL ) (2.5 MG/3ML) 0.083% nebulizer solution 2.5 mg, 2.5 mg, Nebulization, Q2H PRN, Cleatus Delayne GAILS, MD   atorvastatin  (LIPITOR) tablet 20 mg, 20 mg, Oral, Daily, Cleatus Delayne V, MD, 20 mg at 06/11/24 9073   furosemide  (LASIX ) injection 20 mg, 20 mg, Intravenous, BID, Cleatus Delayne V, MD, 20 mg at 06/11/24 9478   HYDROcodone -acetaminophen  (NORCO/VICODIN) 5-325 MG per tablet 1-2 tablet, 1-2 tablet, Oral, Q4H PRN, Cleatus Delayne GAILS, MD   insulin  aspart (novoLOG ) injection 0-5 Units, 0-5 Units, Subcutaneous, QHS, Cleatus Delayne GAILS, MD  insulin  aspart (novoLOG ) injection 0-9 Units, 0-9 Units, Subcutaneous, TID WC, Cleatus Delayne GAILS, MD, 2 Units at 06/11/24 1200   isosorbide  mononitrate (IMDUR ) 24 hr tablet 30 mg, 30 mg, Oral, Daily, Cleatus Delayne GAILS, MD   levothyroxine  (SYNTHROID ) tablet 25 mcg, 25 mcg, Oral, Q0600, Cleatus Delayne GAILS, MD, 25 mcg at 06/11/24 9073   losartan  (COZAAR ) tablet 100 mg, 100 mg, Oral, Daily, Cleatus Delayne GAILS, MD   metoprolol  succinate (TOPROL -XL) 24 hr tablet 25 mg, 25 mg, Oral, Daily, Duncan, Hazel V, MD   morphine (PF) 2 MG/ML injection 2 mg, 2 mg, Intravenous, Q2H PRN, Cleatus Delayne GAILS, MD   ondansetron  (ZOFRAN ) tablet 4 mg, 4 mg, Oral, Q6H PRN **OR** ondansetron  (ZOFRAN ) injection 4 mg, 4 mg, Intravenous, Q6H PRN, Cleatus Delayne GAILS, MD  Current Outpatient Medications:    acetaminophen  (TYLENOL ) 500 MG tablet, Take 500 mg by mouth every 6 (six) hours as needed for mild pain (pain score 1-3)., Disp: , Rfl:    albuterol  (VENTOLIN  HFA) 108 (90 Base) MCG/ACT inhaler, Inhale 1-2 puffs into the lungs every 4 (four) hours as needed., Disp: , Rfl:    aspirin  81 MG tablet,  Take 162 mg by mouth every evening., Disp: , Rfl:    atorvastatin  (LIPITOR) 20 MG tablet, TAKE ONE TABLET EVERY DAY, Disp: 90 tablet, Rfl: 2   Biotin 5 MG CAPS, Take 5 mg by mouth daily., Disp: , Rfl:    calcium  citrate (CALCITRATE - DOSED IN MG ELEMENTAL CALCIUM ) 950 MG tablet, Take 200 mg of elemental calcium  by mouth daily., Disp: , Rfl:    cyclobenzaprine (FLEXERIL) 10 MG tablet, Take 10 mg by mouth 2 (two) times daily as needed., Disp: , Rfl:    escitalopram  (LEXAPRO ) 20 MG tablet, TAKE ONE TABLET EVERY DAY, Disp: 90 tablet, Rfl: 1   fish oil-omega-3 fatty acids 1000 MG capsule, Take 2 g by mouth daily., Disp: , Rfl:    fluticasone -salmeterol (ADVAIR) 250-50 MCG/ACT AEPB, Inhale 1 puff into the lungs 2 (two) times daily., Disp: , Rfl:    levothyroxine  (SYNTHROID ) 25 MCG tablet, TAKE 1 TABLET EVERY DAY ON EMPTY STOMACHWITH A GLASS OF WATER AT LEAST 30-60 MINBEFORE BREAKFAST, Disp: 90 tablet, Rfl: 1   losartan  (COZAAR ) 100 MG tablet, TAKE 1 TABLET BY MOUTH DAILY, Disp: 90 tablet, Rfl: 1   metFORMIN  (GLUCOPHAGE -XR) 500 MG 24 hr tablet, TAKE TWO TABLETS TWICE DAILY (Patient taking differently: Take 500-1,000 mg by mouth 2 (two) times daily. 500mg  qam and 1,000mg  qpm), Disp: 360 tablet, Rfl: 3   metoprolol  succinate (TOPROL -XL) 50 MG 24 hr tablet, TAKE 1 TABLET BY MOUTH DAILY WITH OR FOLLOWING A MEAL, Disp: 90 tablet, Rfl: 1   TURMERIC PO, Take by mouth daily. , Disp: , Rfl:    Accu-Chek Softclix Lancets lancets, Check blood sugar twice daily Dx 250.00, Disp: 200 each, Rfl: 1   Blood Glucose Monitoring Suppl (ONE TOUCH ULTRA SYSTEM KIT) w/Device KIT, 1 kit by Does not apply route once., Disp: , Rfl:    cholecalciferol (VITAMIN D) 400 UNITS TABS, Take by mouth. (Patient not taking: Reported on 06/11/2024), Disp: , Rfl:    Continuous Blood Gluc Receiver (FREESTYLE LIBRE 14 DAY READER) DEVI, Inject 1 Device into the skin every 14 (fourteen) days. Place 1 sensor every 14 days. Use to check sugar at least  4 times daily, Disp: 6 each, Rfl: 3   Continuous Blood Gluc Sensor (FREESTYLE LIBRE 14 DAY SENSOR) MISC, Inject 1 Device into the skin every  14 (fourteen) days., Disp: 6 each, Rfl: 3   ferrous sulfate 325 (65 FE) MG EC tablet, Take 325 mg by mouth daily with breakfast. (Patient not taking: Reported on 06/11/2024), Disp: , Rfl:    glucose blood (ONE TOUCH ULTRA TEST) test strip, CHECK BLOOD SUGAR UP TO THREE TIMES DAILY Dx E11.9, Disp: 300 each, Rfl: 3   isosorbide  mononitrate (IMDUR ) 30 MG 24 hr tablet, Take 1 tablet (30 mg total) by mouth daily. (Patient not taking: Reported on 06/11/2024), Disp: 30 tablet, Rfl: 2   Multiple Vitamin (MULTIVITAMIN WITH MINERALS) TABS tablet, Take 1 tablet by mouth daily. (Patient not taking: Reported on 06/11/2024), Disp: , Rfl:    omeprazole  (PRILOSEC) 40 MG capsule, TAKE 1 CAPSULE EVERY DAY (Patient not taking: Reported on 06/11/2024), Disp: 90 capsule, Rfl: 1   Probiotic Product (PROBIOTIC DAILY PO), Take 1 tablet by mouth. (Patient not taking: Reported on 06/11/2024), Disp: , Rfl:   Facility-Administered Medications Ordered in Other Encounters:    perflutren lipid microspheres (DEFINITY) IV suspension, 1-10 mL, Intravenous, PRN, Cleatus Delayne GAILS, MD, 2 mL at 06/11/24 1339    ALLERGIES   Patient has no known allergies.     REVIEW OF SYSTEMS    Review of Systems:  Gen:  Denies  fever, sweats, chills weigh loss  HEENT: Denies blurred vision, double vision, ear pain, eye pain, hearing loss, nose bleeds, sore throat Cardiac:  No dizziness, chest pain or heaviness, chest tightness,edema Resp:   reports dyspnea chronically  Gi: Denies swallowing difficulty, stomach pain, nausea or vomiting, diarrhea, constipation, bowel incontinence Gu:  Denies bladder incontinence, burning urine Ext:   Denies Joint pain, stiffness or swelling Skin: Denies  skin rash, easy bruising or bleeding or hives Endoc:  Denies polyuria, polydipsia , polyphagia or weight change Psych:    Denies depression, insomnia or hallucinations   Other:  All other systems negative   VS: BP (!) 88/54   Pulse (!) 51   Temp 98.4 F (36.9 C) (Oral)   Resp 20   Ht 5' 4 (1.626 m)   Wt 59.9 kg   SpO2 90%   BMI 22.66 kg/m      PHYSICAL EXAM    GENERAL:NAD, no fevers, chills, no weakness no fatigue HEAD: Normocephalic, atraumatic.  EYES: Pupils equal, round, reactive to light. Extraocular muscles intact. No scleral icterus.  MOUTH: Moist mucosal membrane. Dentition intact. No abscess noted.  EAR, NOSE, THROAT: Clear without exudates. No external lesions.  NECK: Supple. No thyromegaly. No nodules. No JVD.  PULMONARY: decreased breath sounds with mild rhonchi worse at bases bilaterally.  CARDIOVASCULAR: S1 and S2. Regular rate and rhythm. No murmurs, rubs, or gallops. No edema. Pedal pulses 2+ bilaterally.  GASTROINTESTINAL: Soft, nontender, nondistended. No masses. Positive bowel sounds. No hepatosplenomegaly.  MUSCULOSKELETAL: No swelling, clubbing, or edema. Range of motion full in all extremities.  NEUROLOGIC: Cranial nerves II through XII are intact. No gross focal neurological deficits. Sensation intact. Reflexes intact.  SKIN: No ulceration, lesions, rashes, or cyanosis. Skin warm and dry. Turgor intact.  PSYCHIATRIC: Mood, affect within normal limits. The patient is awake, alert and oriented x 3. Insight, judgment intact.       IMAGING   Narrative & Impression  EXAM: CTA of the Chest with contrast for PE 06/11/2024 01:13:52 AM   TECHNIQUE: CTA of the chest was performed after the administration of 75 mL of iohexol  (OMNIPAQUE ) 350 MG/ML injection. Multiplanar reformatted images are provided for review. MIP images are provided for  review. Automated exposure control, iterative reconstruction, and/or weight based adjustment of the mA/kV was utilized to reduce the radiation dose to as low as reasonably achievable.   COMPARISON: Prior examination 12/2023 and more  remote prior examination of 10/26/2023.   CLINICAL HISTORY: Pulmonary embolism (PE) suspected, high prob. Breast cancer. *tracking code: Bo*   FINDINGS:   PULMONARY ARTERIES: Pulmonary arteries are adequately opacified for evaluation. No pulmonary embolism. The central pulmonary arteries are enlarged in keeping with changes of pulmonary arterial hypertension. Main pulmonary artery is normal in caliber.   MEDIASTINUM: Mild coronary artery calcification. Mild cardiomegaly with asymmetric enlargement of the right heart chambers in keeping with elevated right heart pressure. No pericardial effusion. Mild atherosclerotic calcifications within the thoracic aorta. No aortic aneurysm.   LYMPH NODES: Pathologic mediastinal and bilateral hilar adenopathy is present with the index lymph node measuring 17 mm in short axis diameter within the subcarinal lymph node group and 18 mm in diameter within the right hilar lymph node groove (54/4). These appear stable since the prior examination 12/2023 and more remote prior examination of 10/26/2023 possible reflecting the sequela of treated disease, and inflammatory processes such as sarcoidosis, or a low-grade lymphoproliferative process.   LUNGS AND PLEURA: Mild diffuse ground-glass pulmonary opacity is again seen demonstrating a basilar predominance, progressive since prior examination and most suggestive of mild alveolar pulmonary edema. No pneumothorax or pleural effusion.   UPPER ABDOMEN: Limited images of the upper abdomen are unremarkable.   SOFT TISSUES AND BONES: No acute bone or soft tissue abnormality.   IMPRESSION: 1. No pulmonary embolism. 2. Cardiomegaly with asymmetric enlargement of the right heart chambers been with elevated right heart pressure. 3. Morphologic changes in keeping with pulmonary arterial hypertension. 4. Mild diffuse ground-glass pulmonary opacity with basilar predominance, progressive since prior  examination, most suggestive of mild alveolar pulmonary edema. 5. Pathologic mediastinal and bilateral hilar adenopathy, stable since prior examination, possibly reflecting sequela of treated disease, inflammatory processes such as sarcoidosis, or a low-grade lymphoproliferative process.   Electronically signed by: Dorethia Molt MD 06/11/2024 01:25 AM EST RP Workstation: [id number]   ANA Comprehensive Panel - [facility] Specimen: Blood Component Ref Range & Units 1 mo ago Comments  Anti-DNA (DS) Ab Qn - [facility] 0 - 9 IU/mL <1                                    Negative      <5                                    Equivocal  5 - 9                                    Positive      >9  RNP Antibodies - [facility] 0.0 - 0.9 AI <0.2   Smith Antibodies - [facility] 0.0 - 0.9 AI <0.2   Antiscleroderma-70 Antibodies - [facility] 0.0 - 0.9 AI <0.2   Sjogren's Anti-SS-A - [facility] 0.0 - 0.9 AI <0.2   Sjogren's Anti-SS-B - [facility] 0.0 - 0.9 AI <0.2   Antichromatin Antibodies - [facility] 0.0 - 0.9 AI <0.2   Anti-Jo-1 - [facility] 0.0 - 0.9 AI <0.2   Anti-Centromere B Antibodies - [facility] 0.0 - 0.9 AI <  0.2   See below: - [facility] Comment Autoantibody                       Disease Association ------------------------------------------------------------                         Condition                  Frequency ---------------------   ------------------------   --------- Antinuclear Antibody,    SLE, mixed connective Direct (ANA-D)           tissue diseases ---------------------   ------------------------   --------- dsDNA                    SLE                        40 - 60% ---------------------   ------------------------   --------- Chromatin                Drug induced SLE                90%                          SLE                        48 - 97% ---------------------   ------------------------   --------- SSA (Ro)                 SLE                        25 - 35%                           Sjogren's Syndrome         40 - 70%                          Neonatal Lupus                 100% ---------------------   ------------------------   --------- SSB (La)                 SLE                             10%                          Sjogren's Syndrome              30% ---------------------   -----------------------    --------- Sm (anti-Smith)          SLE                        15 - 30% ---------------------   -----------------------    --------- RNP                      Mixed Connective Tissue                          Disease  95% (U1 nRNP,                SLE                        30 - 50% anti-ribonucleoprotein)  Polymyositis and/or                          Dermatomyositis                 20% ---------------------   ------------------------   --------- Scl-70 (antiDNA          Scleroderma (diffuse)      20 - 35% topoisomerase)           Crest                           13% ---------------------   ------------------------   --------- Jo-1                     Polymyositis and/or                          Dermatomyositis            20 - 40% ---------------------   ------------------------   --------- Centromere B             Scleroderma - Crest                          variant                         80%     ASSESSMENT/PLAN   Acute hypoxemic respiratory failure - Patient recently seen in pulmonology clinic-her most recent CT chest was suggestive of underlying interstitial lung disease with reticular nodular peripheral opacification and septal thickening as well as mild honeycombing.  Current CT chest shows similar findings to prior with suggestion of possible underlying sarcoidosis or a low-grade lymphoproliferative process. - I will place additional workup including respiratory viral panel, CRP and ESR, autoimmune workup was negative which was performed with [facility] last month and including rheumatoid factor negative, ANA comprehensive panel  negative, Myo marker panel negative, hypersensitivity pneumonitis negative, mold profile negative, allergy RAST panel negative. - Currently her proBNP is over 3000 and CT chest with ground glass opacification these findings together are highly suggestive of cardiac dysfunction with interstitial edema as partially responsible etiology of hypoxemia. -CRP, ESR, RVP ordered  - patient currently on 4L/min     Transient hypotension   Need to potentiate BP to diurese , have added midodrine 5 tid            Thank you for allowing me to participate in the care of this patient.   Patient/Family are satisfied with care plan and all questions have been answered.    Provider disclosure: Patient with at least one acute or chronic illness or injury that poses a threat to life or bodily function and is being managed actively during this encounter.  All of the below services have been performed independently by signing provider:  review of prior documentation from internal and or external health records.  Review of previous and current lab results.  Interview and comprehensive assessment during patient visit today. Review of current and previous chest radiographs/CT scans. Discussion  of management and test interpretation with health care team and patient/family.   This document was prepared using Dragon voice recognition software and may include unintentional dictation errors.     Cobey Raineri, M.D.  Division of Pulmonary & Critical Care Medicine

## 2024-06-11 NOTE — Consult Note (Signed)
 [facility] CLINIC CARDIOLOGY CONSULT NOTE       Patient ID: Michelle Hawkins MRN: [id number] DOB/AGE: [birth year] 78 y.o.  Admit date: 06/10/2024 Referring Physician Dr. Delayne Solian Primary Physician Sherial Bail, MD  Primary Cardiologist Dr. Ammon Reason for Consultation trigeminy, chest pain  HPI: Michelle Hawkins is a 78 y.o. female  with a past medical history of palpitations, COPD, hypertension, hyperlipidemia, nonobstructive coronary disease by CTA 10/2023 who presented to the ED on 06/10/2024 for chest pain. Cardiology was consulted for further evaluation.   Patient presented for evaluation of chest pain which started last night while she was watching TV.  Workup in the ED notable for creatinine 1.00, potassium 4.4, hemoglobin 13.1, WBC 12.2. Troponins 17 > 17, BNP 1300. EKG in the ED sinus bradycardia rate 57 bpm.  CXR without acute abnormality, CTA chest negative for PE, mild alveolar pulmonary edema.  Overnight had not an episode of trigeminy noted on telemetry.  At the time my evaluation this morning, patient is resting in hospital bed.  We discussed her symptoms in further detail.  She states that she has had shortness of breath for a while but last night she had chest pain which started while she was watching TV and states that it hurts to breathe.  States that she can still feel this currently but it is not as severe.  States that she did have some dry cough yesterday.  Denies any dizziness or lightheadedness.  Reports mild occasional episodes of palpitations.  Review of systems complete and found to be negative unless listed above    Past Medical History:  Diagnosis Date   Allergic rhinitis    Anxiety    Breast cancer (HCC) 1996   s/p left breast lumpectomy (lymph node dissection - 2/11 positive), chemotherapy   Breast cancer (HCC) 1995   right breast lumpectomy   Complication of anesthesia    slow to wake   Depression    Diabetes mellitus    Dysphagia    Family  history of adverse reaction to anesthesia    Mother - slow to wake   GERD (gastroesophageal reflux disease)    H/O ulcer disease    PUD   Hypercholesterolemia    Hypertension    Hypothyroidism    multinodular goiter   Nephrolithiasis    Personal history of chemotherapy    Personal history of radiation therapy    Scoliosis    Skin cancer    SVT (supraventricular tachycardia)     Past Surgical History:  Procedure Laterality Date   ABDOMINAL HYSTERECTOMY     partial   BREAST BIOPSY Left 09/2012   benign   BREAST EXCISIONAL BIOPSY Right    1995 benign   BREAST LUMPECTOMY Left 1996   lumpectomy with chemo and rad tx for breast ca (lymph node dissection - 2/11 positive   BREAST LUMPECTOMY WITH AXILLARY LYMPH NODE DISSECTION  1996   left   CATARACT EXTRACTION W/PHACO Left 11/29/2020   Procedure: CATARACT EXTRACTION PHACO AND INTRAOCULAR LENS PLACEMENT (IOC) LEFT DIABETIC VIVITY TORIC;  Surgeon: Jaye Fallow, MD;  Location: [facility];  Service: Ophthalmology;  Laterality: Left;  Diabetic - oral meds 6.88 00:40.7   CATARACT EXTRACTION W/PHACO Right 12/13/2020   Procedure: CATARACT EXTRACTION PHACO AND INTRAOCULAR LENS PLACEMENT (IOC) RIGHT DIABETIC VIVITY TORIC 5.44 00:40.3;  Surgeon: Jaye Fallow, MD;  Location: [facility];  Service: Ophthalmology;  Laterality: Right;  Diabetic - oral meds   COLONOSCOPY WITH PROPOFOL  N/A 06/03/2019  Procedure: COLONOSCOPY WITH PROPOFOL ;  Surgeon: Dessa Reyes ORN, MD;  Location: [facility] ENDOSCOPY;  Service: Endoscopy;  Laterality: N/A;   ESOPHAGOGASTRODUODENOSCOPY N/A 06/03/2019   Procedure: ESOPHAGOGASTRODUODENOSCOPY (EGD);  Surgeon: Dessa Reyes ORN, MD;  Location: [facility] ENDOSCOPY;  Service: Endoscopy;  Laterality: N/A;   LUMBAR LAMINECTOMY  09/2005   L5-S1    (Not in a hospital admission)  Social History   Socioeconomic History   Marital status: Widowed    Spouse name: Not on file   Number of children: Not on  file   Years of education: Not on file   Highest education level: Not on file  Occupational History   Not on file  Tobacco Use   Smoking status: Former    Current packs/day: 0.00    Average packs/day: 0.5 packs/day for 40.0 years (20.0 ttl pk-yrs)    Types: Cigarettes    Start date: 04/14/1964    Quit date: 04/14/2004    Years since quitting: 20.1   Smokeless tobacco: Never  Vaping Use   Vaping status: Never Used  Substance and Sexual Activity   Alcohol use: No    Alcohol/week: 0.0 standard drinks of alcohol   Drug use: No   Sexual activity: Never  Other Topics Concern   Not on file  Social History Narrative   Not on file   Social Drivers of Health   Financial Resource Strain: Low Risk  (11/21/2023)   Received from [facility]   Overall Financial Resource Strain (CARDIA)    Difficulty of Paying Living Expenses: Not hard at all  Food Insecurity: No Food Insecurity (11/21/2023)   Received from [facility]   Hunger Vital Sign    Within the past 12 months, you worried that your food would run out before you got the money to buy more.: Never true    Within the past 12 months, the food you bought just didn't last and you didn't have money to get more.: Never true  Transportation Needs: No Transportation Needs (11/21/2023)   Received from [facility] - Transportation    In the past 12 months, has lack of transportation kept you from medical appointments or from getting medications?: No    Lack of Transportation (Non-Medical): No  Physical Activity: Not on file  Stress: Not on file  Social Connections: Moderately Integrated (10/26/2023)   Social Connection and Isolation Panel    Frequency of Communication with Friends and Family: More than three times a week    Frequency of Social Gatherings with Friends and Family: More than three times a week    Attends Religious Services: More than 4 times per year    Active Member  of [employer] or Organizations: Yes    Attends Banker Meetings: More than 4 times per year    Marital Status: Widowed  Intimate Partner Violence: Not At Risk (10/26/2023)   Humiliation, Afraid, Rape, and Kick questionnaire    Fear of Current or Ex-Partner: No    Emotionally Abused: No    Physically Abused: No    Sexually Abused: No    Family History  Problem Relation Age of Onset   Alzheimer's disease Mother    Heart disease Father    Hypertension Father    Diabetes Father    Diabetes Sister    Diabetes Sister    Diabetes Sister    Diabetes Sister    Breast cancer Neg Hx      Vitals:  06/11/24 0404 06/11/24 0605 06/11/24 0732 06/11/24 0923  BP:  (!) 122/93  94/60  Pulse:  (!) 57  (!) 104  Resp:  19  17  Temp: 98 F (36.7 C) 98.5 F (36.9 C) 98.4 F (36.9 C)   TempSrc: Oral Oral Oral   SpO2:  93%  95%  Weight:      Height:        PHYSICAL EXAM General: Chronically ill-appearing elderly female, well nourished, in no acute distress. HEENT: Normocephalic and atraumatic. Neck: No JVD.  Lungs: Normal respiratory effort on 4L [place]. Clear bilaterally to auscultation. No wheezes, crackles, rhonchi.  Heart: HRRR. Normal S1 and S2 without gallops or murmurs.  Abdomen: Non-distended appearing.  Msk: Normal strength and tone for age. Extremities: Warm and well perfused. No clubbing, cyanosis.  No edema.  Neuro: Alert and oriented X 3. Psych: Answers questions appropriately.   Labs: Basic Metabolic Panel: Recent Labs    06/11/24 0012  NA 136  K 4.4  CL 99  CO2 25  GLUCOSE 117*  BUN 19  CREATININE 1.00  CALCIUM  9.4   Liver Function Tests: Recent Labs    06/11/24 0012  AST 34  ALT 29  ALKPHOS 95  BILITOT 0.5  PROT 7.4  ALBUMIN  4.2   No results for input(s): LIPASE, AMYLASE in the last 72 hours. CBC: Recent Labs    06/11/24 0012 06/11/24 0600  WBC 12.2* 15.1*  NEUTROABS  --  11.7*  HGB 13.1 12.0  HCT 40.9 37.8  MCV 89.5 89.4  PLT 92*  123*   Cardiac Enzymes: No results for input(s): CKTOTAL, CKMB, CKMBINDEX, TROPONINIHS in the last 72 hours. BNP: No results for input(s): BNP in the last 72 hours. D-Dimer: No results for input(s): DDIMER in the last 72 hours. Hemoglobin A1C: No results for input(s): HGBA1C in the last 72 hours. Fasting Lipid Panel: No results for input(s): CHOL, HDL, LDLCALC, TRIG, CHOLHDL, LDLDIRECT in the last 72 hours. Thyroid  Function Tests: No results for input(s): TSH, T4TOTAL, T3FREE, THYROIDAB in the last 72 hours.  Invalid input(s): FREET3 Anemia Panel: No results for input(s): VITAMINB12, FOLATE, FERRITIN, TIBC, IRON , RETICCTPCT in the last 72 hours.   Radiology: CT Angio Chest Pulmonary Embolism (PE) W or WO Contrast Result Date: 06/11/2024 EXAM: CTA of the Chest with contrast for PE 06/11/2024 01:13:52 AM TECHNIQUE: CTA of the chest was performed after the administration of 75 mL of iohexol  (OMNIPAQUE ) 350 MG/ML injection. Multiplanar reformatted images are provided for review. MIP images are provided for review. Automated exposure control, iterative reconstruction, and/or weight based adjustment of the mA/kV was utilized to reduce the radiation dose to as low as reasonably achievable. COMPARISON: Prior examination 12/2023 and more remote prior examination of 10/26/2023. CLINICAL HISTORY: Pulmonary embolism (PE) suspected, high prob. Breast cancer. *tracking code: Bo* FINDINGS: PULMONARY ARTERIES: Pulmonary arteries are adequately opacified for evaluation. No pulmonary embolism. The central pulmonary arteries are enlarged in keeping with changes of pulmonary arterial hypertension. Main pulmonary artery is normal in caliber. MEDIASTINUM: Mild coronary artery calcification. Mild cardiomegaly with asymmetric enlargement of the right heart chambers in keeping with elevated right heart pressure. No pericardial effusion. Mild atherosclerotic calcifications  within the thoracic aorta. No aortic aneurysm. LYMPH NODES: Pathologic mediastinal and bilateral hilar adenopathy is present with the index lymph node measuring 17 mm in short axis diameter within the subcarinal lymph node group and 18 mm in diameter within the right hilar lymph node groove (54/4). These appear stable since the  prior examination 12/2023 and more remote prior examination of 10/26/2023 possible reflecting the sequela of treated disease, and inflammatory processes such as sarcoidosis, or a low-grade lymphoproliferative process. LUNGS AND PLEURA: Mild diffuse ground-glass pulmonary opacity is again seen demonstrating a basilar predominance, progressive since prior examination and most suggestive of mild alveolar pulmonary edema. No pneumothorax or pleural effusion. UPPER ABDOMEN: Limited images of the upper abdomen are unremarkable. SOFT TISSUES AND BONES: No acute bone or soft tissue abnormality. IMPRESSION: 1. No pulmonary embolism. 2. Cardiomegaly with asymmetric enlargement of the right heart chambers been with elevated right heart pressure. 3. Morphologic changes in keeping with pulmonary arterial hypertension. 4. Mild diffuse ground-glass pulmonary opacity with basilar predominance, progressive since prior examination, most suggestive of mild alveolar pulmonary edema. 5. Pathologic mediastinal and bilateral hilar adenopathy, stable since prior examination, possibly reflecting sequela of treated disease, inflammatory processes such as sarcoidosis, or a low-grade lymphoproliferative process. Electronically signed by: Dorethia Molt MD 06/11/2024 01:25 AM EST RP Workstation: [id number]   DG Chest 2 View Result Date: 06/11/2024 EXAM: 2 VIEW(S) XRAY OF THE CHEST 06/10/2024 11:56:00 PM COMPARISON: 05/26/2024 CLINICAL HISTORY: cough, chest pain FINDINGS: LUNGS AND PLEURA: No focal pulmonary opacity. No pleural effusion. No pneumothorax. HEART AND MEDIASTINUM: No acute abnormality of the cardiac and  mediastinal silhouettes. BONES AND SOFT TISSUES: Left axillary surgical clips noted. Thoracolumbar scoliosis stable. No acute osseous abnormality. IMPRESSION: 1. No acute cardiopulmonary process. Electronically signed by: Norman Gatlin MD 06/11/2024 12:05 AM EST RP Workstation: [id number]   NM Pulmonary Perfusion Result Date: 05/26/2024 CLINICAL DATA:  Worsening shortness of breath for 6 months. Elevated D-dimer levels. EXAM: NUCLEAR MEDICINE PERFUSION LUNG SCAN TECHNIQUE: Perfusion images were obtained in multiple projections after intravenous injection of radiopharmaceutical. No ventilation imaging performed. RADIOPHARMACEUTICALS:  3.86 mCi Tc-7m MAA IV COMPARISON:  Chest radiographs 05/26/2024, noncontrast chest CT 05/14/2024 and chest CTA 10/26/2023 FINDINGS: There is patchy decreased perfusion in both upper lobes, most likely secondary to the emphysema demonstrated on the patient's CT. There are no wedge-shaped perfusion defects to suggest acute pulmonary embolism. IMPRESSION: 1. No evidence of acute pulmonary embolism on perfusion scintigraphy by PISAPED criteria. 2. Decreased perfusion at both lung apices, likely due to emphysema. Electronically Signed   By: Elsie Perone M.D.   On: 05/26/2024 13:50   DG Chest 2 View Result Date: 05/26/2024 CLINICAL DATA:  Shortness of breath. EXAM: CHEST - 2 VIEW COMPARISON:  Chest radiograph dated 10/26/2023. FINDINGS: No focal consolidation, pleural effusion or pneumothorax. The cardiac silhouette is within normal limits. No acute osseous pathology. Degenerative changes of the spine. Left axillary surgical clips. IMPRESSION: No active cardiopulmonary disease. Electronically Signed   By: Vanetta Chou M.D.   On: 05/26/2024 13:02   CT CHEST WO CONTRAST Result Date: 05/19/2024 CLINICAL DATA:  Shortness of breath, former smoker, history of left breast cancer with lumpectomy, radiation, and chemotherapy * Tracking Code: BO * EXAM: CT CHEST WITHOUT CONTRAST  TECHNIQUE: Multidetector CT imaging of the chest was performed following the standard protocol without IV contrast. RADIATION DOSE REDUCTION: This exam was performed according to the departmental dose-optimization program which includes automated exposure control, adjustment of the mA and/or kV according to patient size and/or use of iterative reconstruction technique. COMPARISON:  10/26/2023 FINDINGS: Cardiovascular: Aortic atherosclerosis. Mild cardiomegaly. Left coronary artery calcifications. Enlargement of the main pulmonary artery measuring up to 3.8 cm in caliber. No pericardial effusion. Mediastinum/Nodes: No enlarged mediastinal, hilar, or axillary lymph nodes. Thyroid  gland, trachea, and esophagus demonstrate no significant  findings. Lungs/Pleura: Background of fine centrilobular nodularity most concentrated in the lung apices. Mild, bibasilar predominant pulmonary fibrosis featuring irregular peripheral interstitial opacity, septal thickening, and minimal traction bronchiectasis without clear evidence of subpleural bronchiolectasis or honeycombing. No pleural effusion or pneumothorax. Upper Abdomen: No acute abnormality. Gallstone. Coarse contour of the liver. Musculoskeletal: Left lumpectomy and axillary lymph node dissection. No acute osseous findings. IMPRESSION: 1. Mild, bibasilar predominant pulmonary fibrosis featuring irregular peripheral interstitial opacity, septal thickening, and minimal traction bronchiectasis without clear evidence of subpleural bronchiolectasis or honeycombing. 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 Am JINNY Honey Crit Care Med Vol 198, Iss 5, [phone], Mar 09 2017. 2. Background of fine centrilobular nodularity most concentrated in the lung apices, most commonly seen in smoking-related respiratory bronchiolitis. 3. Enlargement of the main pulmonary  artery, as can be seen in pulmonary hypertension. 4. Coarse contour of the liver, suggestive of cirrhosis. Correlate with biochemical findings. 5. Cholelithiasis. Aortic Atherosclerosis (ICD10-I70.0). Electronically Signed   By: Marolyn JONETTA Jaksch M.D.   On: 05/19/2024 20:43    ECHO ordered  TELEMETRY (personally reviewed): sinus rhythm rate 50s, occasional PVCs  EKG (personally reviewed): Sinus bradycardia rate 57 bpm  Data reviewed by me 06/11/2024: last 24h vitals tele labs imaging I/O ED provider note, admission H&P  Principal Problem:   Chest pain Active Problems:   Diabetes mellitus (HCC)   History of breast cancer s/p chemoradiation   Possible new onset CHF (congestive heart failure) (HCC)   COPD (chronic obstructive pulmonary disease) (HCC)   Thrombocytopenia, new   Mediastinal lymphadenopathy   Ventricular trigeminy    ASSESSMENT AND PLAN:  Michelle Hawkins is a 78 y.o. female  with a past medical history of palpitations, COPD, hypertension, hyperlipidemia, nonobstructive coronary disease by CTA 10/2023 who presented to the ED on 06/10/2024 for chest pain. Cardiology was consulted for further evaluation.   # Noncardiac chest pain # Occasional PVCs # COPD Patient presented after episode of chest discomfort last night which started while watching TV, troponin is normal x 2.  EKG without acute ischemic changes.  Had CTA coronary earlier this year which revealed nonobstructive coronary artery disease.  Episode of trigeminy overnight last night, now with occasional PVCs noted on telemetry.  She has known history of this. - Echo ordered, further recommendations pending these results. - Currently home BP medications are being held due to hypotension. - Agree with IV Lasix 20 mg twice daily. - Continue atorvastatin  20 mg daily. - No plan for further cardiac diagnostics.  Do not suspect a cardiac cause of her chest discomfort symptoms.   This patient's plan of care was discussed and created  with Dr. Florencio and he is in agreement.  Signed: Danita Bloch, PA-C  06/11/2024, 9:44 AM [facility]

## 2024-06-11 NOTE — Assessment & Plan Note (Signed)
 Not acutely exacerbated Continue home inhalers.  DuoNebs as needed

## 2024-06-11 NOTE — Progress Notes (Addendum)
 Same day note  Michelle Hawkins is a 78 y.o. female with medical history significant for diabetes mellitus type 2, hypertension, COPD, former smoker, history of breast cancer status post chemoradiation, exertional dyspnea being worked up by pulmonary as outpatient with prior CT showing pulmonary fibrosis mediastinal adenopathy and pulmonary hypertension presented to hospital with substernal chest pain  with shortness of breath. Of note, patient was seen by cardiology on 12/1 and had a negative stress test earlier this year in April, EF 70%, with a minimal nonobstructive CAD on coronary CTA.  In the ED vitals were stable.  Troponins were negative.  proBNP elevated at 3081. CBC notable for mild leukocytosis of 12.2 and new thrombocytopenia of 92.  Respiratory viral panel was negative for COVID influenza and RSV.  EKG showed normal sinus rhythm with borderline T wave abnormalities.  CTA of the chest was negative for PE but showed cardiomegaly and alveolar pulmonary edema with midsternal hilar lymphadenopathy. Patient seen and examined at bedside.  Patient was admitted to the hospital for chest pain and shortness of breath  At the time of my evaluation, patient complains of shortness of breath.  Denies chest pain at this time.  Physical examination reveals obese built female, on 4 L of oxygen  by nasal cannula.  Laboratory data and imaging was reviewed  Assessment and Plan.   Chest pain Nonobstructive CAD on coronary CTA 10/2023 (negative stress test 10/2023).  CTA of the chest was negative for PE but mediastinal lymphadenopathy.  Patient also had ventricular trigeminy.  Cardiology has been consulted.  Troponin 17 followed by 17 and not elevated.  Check 2D echocardiogram.  Possible new onset CHF (congestive heart failure) Patient presenting with new exertional shortness of breath, elevated proBNP, mild pulmonary edema on CTA chest.  Trial of Lasix IV twice daily..  Continue metoprolol .  Losartan  and Imdur  on  hold due to borderline low blood pressure.  Check 2D echocardiogram.  Thrombocytopenia, new Mild leukocytosis Mild thrombocytopenia.  Peripheral blood smear with normal platelet morphology   Mediastinal lymphadenopathy Pulmonary hypertension Seen by pulmonary as outpatient.   COPD (chronic obstructive pulmonary disease) Continue DuoNebs.   History of breast cancer s/p chemoradiation Stable at this time   Diabetes mellitus  Continue sliding scale insulin , Accu-Cheks, diabetic diet.  I spoke with the patient's daughter on the phone and updated her. She wishes Pulmonary consult for the patient. Notified Dr Parris.  No Charge  Signed,  Vernal Anselm Alstrom, MD [facility]

## 2024-06-12 DIAGNOSIS — D696 Thrombocytopenia, unspecified: Secondary | ICD-10-CM

## 2024-06-12 DIAGNOSIS — I5021 Acute systolic (congestive) heart failure: Secondary | ICD-10-CM

## 2024-06-12 DIAGNOSIS — J449 Chronic obstructive pulmonary disease, unspecified: Secondary | ICD-10-CM | POA: Diagnosis not present

## 2024-06-12 DIAGNOSIS — R0789 Other chest pain: Secondary | ICD-10-CM

## 2024-06-12 LAB — CBC
HCT: 39.1 % (ref 36.0–46.0)
Hemoglobin: 13 g/dL (ref 12.0–15.0)
MCH: 29 pg (ref 26.0–34.0)
MCHC: 33.2 g/dL (ref 30.0–36.0)
MCV: 87.1 fL (ref 80.0–100.0)
Platelets: UNDETERMINED K/uL (ref 150–400)
RBC: 4.49 MIL/uL (ref 3.87–5.11)
RDW: 15.8 % — ABNORMAL HIGH (ref 11.5–15.5)
WBC: 8.1 K/uL (ref 4.0–10.5)
nRBC: 0 % (ref 0.0–0.2)

## 2024-06-12 LAB — GLUCOSE, CAPILLARY
Glucose-Capillary: 191 mg/dL — ABNORMAL HIGH (ref 70–99)
Glucose-Capillary: 151 mg/dL — ABNORMAL HIGH (ref 70–99)
Glucose-Capillary: 148 mg/dL — ABNORMAL HIGH (ref 70–99)
Glucose-Capillary: 151 mg/dL — ABNORMAL HIGH (ref 70–99)

## 2024-06-12 LAB — MAGNESIUM: Magnesium: 1.8 mg/dL (ref 1.7–2.4)

## 2024-06-12 LAB — BASIC METABOLIC PANEL WITH GFR
Anion gap: 10 (ref 5–15)
BUN: 20 mg/dL (ref 8–23)
CO2: 24 mmol/L (ref 22–32)
Calcium: 9.7 mg/dL (ref 8.9–10.3)
Chloride: 97 mmol/L — ABNORMAL LOW (ref 98–111)
Creatinine, Ser: 0.92 mg/dL (ref 0.44–1.00)
GFR, Estimated: >60 mL/min (ref >60)
Glucose, Bld: 267 mg/dL — ABNORMAL HIGH (ref 70–99)
Potassium: 4.1 mmol/L (ref 3.5–5.1)
Sodium: 131 mmol/L — ABNORMAL LOW (ref 135–145)

## 2024-06-12 LAB — GLUCOSE, RANDOM: Glucose, Bld: 406 mg/dL — ABNORMAL HIGH (ref 70–99)

## 2024-06-12 LAB — SEDIMENTATION RATE: Sed Rate: 17 mm/h (ref 0–30)

## 2024-06-12 LAB — C-REACTIVE PROTEIN: CRP: 10.7 mg/dL — ABNORMAL HIGH (ref <1.0)

## 2024-06-12 MED ORDER — METOPROLOL SUCCINATE ER 25 MG PO TB24
12.5000 mg | ORAL_TABLET | Freq: Every day | ORAL | Status: DC
  Administered 2024-06-13 – 2024-06-16 (×4): 12.5 mg via ORAL
  Filled 2024-06-12 (×4): qty 1

## 2024-06-12 MED ORDER — ENOXAPARIN SODIUM 40 MG/0.4ML IJ SOSY
40.0000 mg | PREFILLED_SYRINGE | INTRAMUSCULAR | Status: DC
  Administered 2024-06-12 – 2024-06-15 (×4): 40 mg via SUBCUTANEOUS
  Filled 2024-06-12 (×4): qty 0.4

## 2024-06-12 MED ORDER — AZITHROMYCIN 250 MG PO TABS
250.0000 mg | ORAL_TABLET | Freq: Every day | ORAL | Status: DC
  Administered 2024-06-12 – 2024-06-16 (×5): 250 mg via ORAL
  Filled 2024-06-12 (×5): qty 1

## 2024-06-12 MED ORDER — SODIUM CHLORIDE 0.9 % IV SOLN
1000.0000 mg | Freq: Every day | INTRAVENOUS | Status: DC
  Administered 2024-06-13 – 2024-06-14 (×2): 1000 mg via INTRAVENOUS
  Filled 2024-06-12 (×2): qty 16

## 2024-06-12 NOTE — Progress Notes (Signed)
 Heart Failure Navigator Progress Note  Assessed for Heart & Vascular TOC clinic readiness.  Does not meet criteria due to current [facility] patient.   Navigator will sign off at this time.  Roxy Horseman, RN, BSN [facility] Heart Failure Navigator Secure Chat Only

## 2024-06-12 NOTE — Progress Notes (Signed)
 PULMONOLOGY         Date: 06/12/2024,   MRN# [id number] Michelle Hawkins [birth year]     AdmissionWeight: 59.9 kg                 CurrentWeight: 59.8 kg  Referring provider: Dr Sonjia   CHIEF COMPLAINT:   Progressive dyspnea on exertion with chest pain   HISTORY OF PRESENT ILLNESS   This is a 78 year old female with a history of diabetes essential hypertension COPD was a previous smoker, history of breast cancer status post chemo and radiation therapy who was seen by me previously in pulmonology clinic for an outpatient.  She is presented to the emergency department with chest discomfort and dyspnea.  She had a CT chest in the past most recently May 14, 2024 which showed peripheral basilar predominant reticular and septal thickening with associated traction bronchiectasis minimal honeycombing.  There was no pleural effusion, pneumothorax, suspicious appearing nodules, residual scarring from left lumpectomy and axillary lymph node dissection.  Overall findings were suggestive of possible interstitial lung disease.  She also had a VQ scan performed November 18 which was negative for any acute pulmonary embolism or perfusion defect.  She did have matched decreased perfusion at both lung apices likely due to underlying emphysema.  On this admission she had a repeat CT with PE protocol with similar findings as before including mild cardiomegaly and asymmetric RV enlargement, mild hilar and mediastinal adenopathy which appears stable from June 2025 as well as diffuse ground glass opacification suggestive of alveolar pulmonary edema.  Her proBNP was over 3000 on this admission suggestive of cardiac etiology of interstitial pulmonary edema.  Her CMP is essentially unremarkable besides a mild decrement in GFR at 57.  Viral testing for flu, RSV and COVID-19 are negative.  PCCM consultation for further evaluation management.   06/12/24- patient reports minimal improvement clinically. She has  elevated CRP which seems to be due to ILD exacerbation. She will need 1000mg  solumedrol x 3 days Then taper.  PAST MEDICAL HISTORY   Past Medical History:  Diagnosis Date   Allergic rhinitis    Anxiety    Breast cancer (HCC) 1996   s/p left breast lumpectomy (lymph node dissection - 2/11 positive), chemotherapy   Breast cancer (HCC) 1995   right breast lumpectomy   Complication of anesthesia    slow to wake   Depression    Diabetes mellitus    Dysphagia    Family history of adverse reaction to anesthesia    Mother - slow to wake   GERD (gastroesophageal reflux disease)    H/O ulcer disease    PUD   Hypercholesterolemia    Hypertension    Hypothyroidism    multinodular goiter   Nephrolithiasis    Personal history of chemotherapy    Personal history of radiation therapy    Scoliosis    Skin cancer    SVT (supraventricular tachycardia)      SURGICAL HISTORY   Past Surgical History:  Procedure Laterality Date   ABDOMINAL HYSTERECTOMY     partial   BREAST BIOPSY Left 09/2012   benign   BREAST EXCISIONAL BIOPSY Right    1995 benign   BREAST LUMPECTOMY Left 1996   lumpectomy with chemo and rad tx for breast ca (lymph node dissection - 2/11 positive   BREAST LUMPECTOMY WITH AXILLARY LYMPH NODE DISSECTION  1996   left   CATARACT EXTRACTION W/PHACO Left 11/29/2020   Procedure: CATARACT EXTRACTION PHACO  AND INTRAOCULAR LENS PLACEMENT (IOC) LEFT DIABETIC VIVITY TORIC;  Surgeon: Jaye Fallow, MD;  Location: [facility];  Service: Ophthalmology;  Laterality: Left;  Diabetic - oral meds 6.88 00:40.7   CATARACT EXTRACTION W/PHACO Right 12/13/2020   Procedure: CATARACT EXTRACTION PHACO AND INTRAOCULAR LENS PLACEMENT (IOC) RIGHT DIABETIC VIVITY TORIC 5.44 00:40.3;  Surgeon: Jaye Fallow, MD;  Location: [facility];  Service: Ophthalmology;  Laterality: Right;  Diabetic - oral meds   COLONOSCOPY WITH PROPOFOL  N/A 06/03/2019   Procedure: COLONOSCOPY WITH  PROPOFOL ;  Surgeon: Dessa Reyes ORN, MD;  Location: [facility];  Service: Endoscopy;  Laterality: N/A;   ESOPHAGOGASTRODUODENOSCOPY N/A 06/03/2019   Procedure: ESOPHAGOGASTRODUODENOSCOPY (EGD);  Surgeon: Dessa Reyes ORN, MD;  Location: [facility];  Service: Endoscopy;  Laterality: N/A;   LUMBAR LAMINECTOMY  09/2005   L5-S1     FAMILY HISTORY   Family History  Problem Relation Age of Onset   Alzheimer's disease Mother    Heart disease Father    Hypertension Father    Diabetes Father    Diabetes Sister    Diabetes Sister    Diabetes Sister    Diabetes Sister    Breast cancer Neg Hx      SOCIAL HISTORY   Social History   Tobacco Use   Smoking status: Former    Current packs/day: 0.00    Average packs/day: 0.5 packs/day for 40.0 years (20.0 ttl pk-yrs)    Types: Cigarettes    Start date: 04/14/1964    Quit date: 04/14/2004    Years since quitting: 20.1   Smokeless tobacco: Never  Vaping Use   Vaping status: Never Used  Substance Use Topics   Alcohol use: No    Alcohol/week: 0.0 standard drinks of alcohol   Drug use: No     MEDICATIONS      Current Medication:  Current Facility-Administered Medications:    acetaminophen  (TYLENOL ) tablet 650 mg, 650 mg, Oral, Q6H PRN, 650 mg at 06/11/24 1038 **OR** acetaminophen  (TYLENOL ) suppository 650 mg, 650 mg, Rectal, Q6H PRN, Cleatus Delayne GAILS, MD   albuterol  (PROVENTIL ) (2.5 MG/3ML) 0.083% nebulizer solution 2.5 mg, 2.5 mg, Nebulization, Q2H PRN, Cleatus Delayne GAILS, MD   atorvastatin  (LIPITOR) tablet 20 mg, 20 mg, Oral, Daily, Cleatus Delayne V, MD, 20 mg at 06/12/24 9073   furosemide  (LASIX ) injection 20 mg, 20 mg, Intravenous, BID, Cleatus Delayne V, MD, 20 mg at 06/12/24 9240   HYDROcodone -acetaminophen  (NORCO/VICODIN) 5-325 MG per tablet 1-2 tablet, 1-2 tablet, Oral, Q4H PRN, Cleatus Delayne GAILS, MD   insulin  aspart (novoLOG ) injection 0-5 Units, 0-5 Units, Subcutaneous, QHS, Cleatus Delayne GAILS, MD, 5 Units at 06/12/24  0058   insulin  aspart (novoLOG ) injection 0-9 Units, 0-9 Units, Subcutaneous, TID WC, Cleatus Delayne GAILS, MD, 2 Units at 06/12/24 1140   isosorbide  mononitrate (IMDUR ) 24 hr tablet 30 mg, 30 mg, Oral, Daily, Cleatus Delayne V, MD, 30 mg at 06/12/24 9073   levothyroxine  (SYNTHROID ) tablet 25 mcg, 25 mcg, Oral, Q0600, Cleatus Delayne GAILS, MD, 25 mcg at 06/12/24 0759   losartan  (COZAAR ) tablet 100 mg, 100 mg, Oral, Daily, Cleatus Delayne GAILS, MD   methylPREDNISolone  sodium succinate (SOLU-MEDROL ) 40 mg/mL injection 40 mg, 40 mg, Intravenous, Q24H, Kwame Ryland, MD, 40 mg at 06/11/24 1652   [START ON 06/13/2024] metoprolol  succinate (TOPROL -XL) 24 hr tablet 12.5 mg, 12.5 mg, Oral, Daily, Hudson, Caralyn, PA-C   midodrine  (PROAMATINE ) tablet 5 mg, 5 mg, Oral, TID WC, Doyle Tegethoff, MD, 5 mg at 06/12/24 1140  morphine  (PF) 2 MG/ML injection 2 mg, 2 mg, Intravenous, Q2H PRN, Cleatus Delayne GAILS, MD   ondansetron  (ZOFRAN ) tablet 4 mg, 4 mg, Oral, Q6H PRN **OR** ondansetron  (ZOFRAN ) injection 4 mg, 4 mg, Intravenous, Q6H PRN, Cleatus Delayne GAILS, MD    ALLERGIES   Patient has no known allergies.     REVIEW OF SYSTEMS    Review of Systems:  Gen:  Denies  fever, sweats, chills weigh loss  HEENT: Denies blurred vision, double vision, ear pain, eye pain, hearing loss, nose bleeds, sore throat Cardiac:  No dizziness, chest pain or heaviness, chest tightness,edema Resp:   reports dyspnea chronically  Gi: Denies swallowing difficulty, stomach pain, nausea or vomiting, diarrhea, constipation, bowel incontinence Gu:  Denies bladder incontinence, burning urine Ext:   Denies Joint pain, stiffness or swelling Skin: Denies  skin rash, easy bruising or bleeding or hives Endoc:  Denies polyuria, polydipsia , polyphagia or weight change Psych:   Denies depression, insomnia or hallucinations   Other:  All other systems negative   VS: BP 103/60 (BP Location: Right Arm)   Pulse (!) 57   Temp 98.4 F (36.9 C) (Oral)    Resp 20   Ht 5' 4 (1.626 m)   Wt 59.8 kg   SpO2 92%   BMI 22.63 kg/m      PHYSICAL EXAM    GENERAL:NAD, no fevers, chills, no weakness no fatigue HEAD: Normocephalic, atraumatic.  EYES: Pupils equal, round, reactive to light. Extraocular muscles intact. No scleral icterus.  MOUTH: Moist mucosal membrane. Dentition intact. No abscess noted.  EAR, NOSE, THROAT: Clear without exudates. No external lesions.  NECK: Supple. No thyromegaly. No nodules. No JVD.  PULMONARY: decreased breath sounds with mild rhonchi worse at bases bilaterally.  CARDIOVASCULAR: S1 and S2. Regular rate and rhythm. No murmurs, rubs, or gallops. No edema. Pedal pulses 2+ bilaterally.  GASTROINTESTINAL: Soft, nontender, nondistended. No masses. Positive bowel sounds. No hepatosplenomegaly.  MUSCULOSKELETAL: No swelling, clubbing, or edema. Range of motion full in all extremities.  NEUROLOGIC: Cranial nerves II through XII are intact. No gross focal neurological deficits. Sensation intact. Reflexes intact.  SKIN: No ulceration, lesions, rashes, or cyanosis. Skin warm and dry. Turgor intact.  PSYCHIATRIC: Mood, affect within normal limits. The patient is awake, alert and oriented x 3. Insight, judgment intact.       IMAGING   Narrative & Impression  EXAM: CTA of the Chest with contrast for PE 06/11/2024 01:13:52 AM   TECHNIQUE: CTA of the chest was performed after the administration of 75 mL of iohexol  (OMNIPAQUE ) 350 MG/ML injection. Multiplanar reformatted images are provided for review. MIP images are provided for review. Automated exposure control, iterative reconstruction, and/or weight based adjustment of the mA/kV was utilized to reduce the radiation dose to as low as reasonably achievable.   COMPARISON: Prior examination 12/2023 and more remote prior examination of 10/26/2023.   CLINICAL HISTORY: Pulmonary embolism (PE) suspected, high prob. Breast cancer. *tracking code: Bo*   FINDINGS:    PULMONARY ARTERIES: Pulmonary arteries are adequately opacified for evaluation. No pulmonary embolism. The central pulmonary arteries are enlarged in keeping with changes of pulmonary arterial hypertension. Main pulmonary artery is normal in caliber.   MEDIASTINUM: Mild coronary artery calcification. Mild cardiomegaly with asymmetric enlargement of the right heart chambers in keeping with elevated right heart pressure. No pericardial effusion. Mild atherosclerotic calcifications within the thoracic aorta. No aortic aneurysm.   LYMPH NODES: Pathologic mediastinal and bilateral hilar adenopathy is present with the  index lymph node measuring 17 mm in short axis diameter within the subcarinal lymph node group and 18 mm in diameter within the right hilar lymph node groove (54/4). These appear stable since the prior examination 12/2023 and more remote prior examination of 10/26/2023 possible reflecting the sequela of treated disease, and inflammatory processes such as sarcoidosis, or a low-grade lymphoproliferative process.   LUNGS AND PLEURA: Mild diffuse ground-glass pulmonary opacity is again seen demonstrating a basilar predominance, progressive since prior examination and most suggestive of mild alveolar pulmonary edema. No pneumothorax or pleural effusion.   UPPER ABDOMEN: Limited images of the upper abdomen are unremarkable.   SOFT TISSUES AND BONES: No acute bone or soft tissue abnormality.   IMPRESSION: 1. No pulmonary embolism. 2. Cardiomegaly with asymmetric enlargement of the right heart chambers been with elevated right heart pressure. 3. Morphologic changes in keeping with pulmonary arterial hypertension. 4. Mild diffuse ground-glass pulmonary opacity with basilar predominance, progressive since prior examination, most suggestive of mild alveolar pulmonary edema. 5. Pathologic mediastinal and bilateral hilar adenopathy, stable since prior examination, possibly  reflecting sequela of treated disease, inflammatory processes such as sarcoidosis, or a low-grade lymphoproliferative process.   Electronically signed by: Dorethia Molt MD 06/11/2024 01:25 AM EST RP Workstation: [id number]   ANA Comprehensive Panel - [facility] Specimen: Blood Component Ref Range & Units 1 mo ago Comments  Anti-DNA (DS) Ab Qn - [facility] 0 - 9 IU/mL <1                                    Negative      <5                                    Equivocal  5 - 9                                    Positive      >9  RNP Antibodies - [facility] 0.0 - 0.9 AI <0.2   Smith Antibodies - [facility] 0.0 - 0.9 AI <0.2   Antiscleroderma-70 Antibodies - [facility] 0.0 - 0.9 AI <0.2   Sjogren's Anti-SS-A - [facility] 0.0 - 0.9 AI <0.2   Sjogren's Anti-SS-B - [facility] 0.0 - 0.9 AI <0.2   Antichromatin Antibodies - [facility] 0.0 - 0.9 AI <0.2   Anti-Jo-1 - [facility] 0.0 - 0.9 AI <0.2   Anti-Centromere B Antibodies - [facility] 0.0 - 0.9 AI <0.2   See below: - [facility] Comment Autoantibody                       Disease Association ------------------------------------------------------------                         Condition                  Frequency ---------------------   ------------------------   --------- Antinuclear Antibody,    SLE, mixed connective Direct (ANA-D)           tissue diseases ---------------------   ------------------------   --------- dsDNA                    SLE  40 - 60% ---------------------   ------------------------   --------- Chromatin                Drug induced SLE                90%                          SLE                        48 - 97% ---------------------   ------------------------   --------- SSA (Ro)                 SLE                        25 - 35%                          Sjogren's Syndrome         40 - 70%                          Neonatal Lupus                 100% ---------------------   ------------------------   --------- SSB (La)                  SLE                             10%                          Sjogren's Syndrome              30% ---------------------   -----------------------    --------- Sm (anti-Smith)          SLE                        15 - 30% ---------------------   -----------------------    --------- RNP                      Mixed Connective Tissue                          Disease                         95% (U1 nRNP,                SLE                        30 - 50% anti-ribonucleoprotein)  Polymyositis and/or                          Dermatomyositis                 20% ---------------------   ------------------------   --------- Scl-70 (antiDNA          Scleroderma (diffuse)      20 - 35% topoisomerase)           Crest  13% ---------------------   ------------------------   --------- Jo-1                     Polymyositis and/or                          Dermatomyositis            20 - 40% ---------------------   ------------------------   --------- Centromere B             Scleroderma - Crest                          variant                         80%     ASSESSMENT/PLAN   Acute hypoxemic respiratory failure - Patient recently seen in pulmonology clinic-her most recent CT chest was suggestive of underlying interstitial lung disease with reticular nodular peripheral opacification and septal thickening as well as mild honeycombing.  Current CT chest shows similar findings to prior with suggestion of possible underlying sarcoidosis or a low-grade lymphoproliferative process. - I will place additional workup including respiratory viral panel, CRP and ESR, autoimmune workup was negative which was performed with [facility] last month and including rheumatoid factor negative, ANA comprehensive panel negative, Myo marker panel negative, hypersensitivity pneumonitis negative, mold profile negative, allergy RAST panel negative. - Currently her proBNP is over 3000 and CT  chest with ground glass opacification these findings together are highly suggestive of cardiac dysfunction with interstitial edema as partially responsible etiology of hypoxemia. -CRP, ESR elevated   RVP is negative - patient currently on 4L/min     Transient hypotension   Need to potentiate BP to diurese , have added midodrine  5 tid            Thank you for allowing me to participate in the care of this patient.   Patient/Family are satisfied with care plan and all questions have been answered.    Provider disclosure: Patient with at least one acute or chronic illness or injury that poses a threat to life or bodily function and is being managed actively during this encounter.  All of the below services have been performed independently by signing provider:  review of prior documentation from internal and or external health records.  Review of previous and current lab results.  Interview and comprehensive assessment during patient visit today. Review of current and previous chest radiographs/CT scans. Discussion of management and test interpretation with health care team and patient/family.   This document was prepared using Dragon voice recognition software and may include unintentional dictation errors.     Rohin Krejci, M.D.  Division of Pulmonary & Critical Care Medicine

## 2024-06-12 NOTE — Progress Notes (Signed)
 [facility]  - [place] at [facility]   PATIENT NAME: Michelle Hawkins    MR#:  [id number]  DATE OF BIRTH:  February 23, 1946  SUBJECTIVE:  no family at bedside. Patient overall feeling well gets short of breath with exertion. Wears oxygen  at home. Currently brought down to 2 1/2 L per minute. Sats remain more than 92%. Denies any chest pain. Tolerating PO diet. She is from assisted living at [facility] tells me she is fairly independent.    VITALS:  Blood pressure 103/60, pulse (!) 57, temperature 98.4 F (36.9 C), temperature source Oral, resp. rate 20, height 5' 4 (1.626 m), weight 59.8 kg, SpO2 92%.  PHYSICAL EXAMINATION:   GENERAL:  78 y.o.-year-old patient with no acute distress.  LUNGS: decreased breath sounds bilaterally, no wheezing CARDIOVASCULAR: S1, S2 normal. No murmur   ABDOMEN: Soft, nontender, nondistended. Bowel sounds present.  EXTREMITIES: No  edema b/l.    NEUROLOGIC: nonfocal  patient is alert and awake SKIN: No obvious rash, lesion, or ulcer.   LABORATORY PANEL:  CBC Recent Labs  Lab 06/12/24 0409  WBC 8.1  HGB 13.0  HCT 39.1  PLT PLATELET CLUMPS NOTED ON SMEAR, UNABLE TO ESTIMATE    Chemistries  Recent Labs  Lab 06/11/24 0012 06/11/24 2309 06/12/24 0409  NA 136  --  131*  K 4.4  --  4.1  CL 99  --  97*  CO2 25  --  24  GLUCOSE 117*   < > 267*  BUN 19  --  20  CREATININE 1.00  --  0.92  CALCIUM  9.4  --  9.7  MG  --   --  1.8  AST 34  --   --   ALT 29  --   --   ALKPHOS 95  --   --   BILITOT 0.5  --   --    < > = values in this interval not displayed.   Cardiac Enzymes No results for input(s): TROPONINI in the last 168 hours. RADIOLOGY:  ECHOCARDIOGRAM COMPLETE Result Date: 06/11/2024    ECHOCARDIOGRAM REPORT   Patient Name:   Michelle Hawkins Date of Exam: 06/11/2024 Medical Rec #:  [id number]    Height:       64.0 in Accession #:    [id number]   Weight:       132.0 lb Date of Birth:  August 20, 1945    BSA:          1.640 m Patient Age:     78 years     BP:           122/93 mmHg Patient Gender: F            HR:           50 bpm. Exam Location:  [facility] Procedure: 2D Echo, Color Doppler, Cardiac Doppler and Intracardiac            Opacification Agent (Both Spectral and Color Flow Doppler were            utilized during procedure). Indications:     CHF-Acute Diastolic I50.31  History:         Patient has prior history of Echocardiogram examinations, most                  recent 10/27/2023. CHF.  Sonographer:     Ashley McNeely-Sloane Referring Phys:  [id number] DELAYNE LULLA SOLIAN Diagnosing Phys: Cara JONETTA Lovelace MD IMPRESSIONS  1. Left ventricular ejection  fraction, by estimation, is 60 to 65%. The left ventricle has normal function. The left ventricle has no regional wall motion abnormalities. Left ventricular diastolic parameters are consistent with Grade I diastolic dysfunction (impaired relaxation). There is the interventricular septum is flattened in diastole ('D' shaped left ventricle), consistent with right ventricular volume overload.  2. Right ventricular systolic function is mildly reduced. The right ventricular size is moderately enlarged.  3. The mitral valve is grossly normal. Moderate mitral valve regurgitation.  4. The aortic valve is normal in structure. Aortic valve regurgitation is not visualized. FINDINGS  Left Ventricle: Left ventricular ejection fraction, by estimation, is 60 to 65%. The left ventricle has normal function. The left ventricle has no regional wall motion abnormalities. Definity  contrast agent was given IV to delineate the left ventricular  endocardial borders. Strain was performed and the global longitudinal strain is indeterminate. The left ventricular internal cavity size was normal in size. There is no left ventricular hypertrophy. The interventricular septum is flattened in diastole ('D' shaped left ventricle), consistent with right ventricular volume overload. Left ventricular diastolic parameters are consistent with  Grade I diastolic dysfunction (impaired relaxation). Right Ventricle: The right ventricular size is moderately enlarged. No increase in right ventricular wall thickness. Right ventricular systolic function is mildly reduced. Left Atrium: Left atrial size was normal in size. Right Atrium: Right atrial size was normal in size. Pericardium: There is no evidence of pericardial effusion. Mitral Valve: The mitral valve is grossly normal. Moderate mitral valve regurgitation. MV peak gradient, 4.2 mmHg. The mean mitral valve gradient is 1.0 mmHg. Tricuspid Valve: The tricuspid valve is normal in structure. Tricuspid valve regurgitation is mild. Aortic Valve: The aortic valve is normal in structure. Aortic valve regurgitation is not visualized. Aortic valve mean gradient measures 2.0 mmHg. Aortic valve peak gradient measures 4.5 mmHg. Aortic valve area, by VTI measures 1.91 cm. Pulmonic Valve: The pulmonic valve was normal in structure. Pulmonic valve regurgitation is not visualized. Aorta: The ascending aorta was not well visualized. IAS/Shunts: No atrial level shunt detected by color flow Doppler. Additional Comments: 3D was performed not requiring image post processing on an independent workstation and was indeterminate.  LEFT VENTRICLE PLAX 2D LVIDd:         3.90 cm     Diastology LVIDs:         2.00 cm     LV e' medial:    4.79 cm/s LV PW:         1.00 cm     LV E/e' medial:  11.8 LV IVS:        1.00 cm     LV e' lateral:   5.55 cm/s LVOT diam:     1.70 cm     LV E/e' lateral: 10.2 LV SV:         50 LV SV Index:   30 LVOT Area:     2.27 cm  LV Volumes (MOD) LV vol d, MOD A2C: 45.7 ml LV vol d, MOD A4C: 49.6 ml LV vol s, MOD A2C: 15.7 ml LV vol s, MOD A4C: 16.7 ml LV SV MOD A2C:     30.0 ml LV SV MOD A4C:     49.6 ml LV SV MOD BP:      31.3 ml RIGHT VENTRICLE            IVC RV Basal diam:  4.10 cm    IVC diam: 2.70 cm RV Mid diam:    3.70 cm RV S  prime:     9.57 cm/s TAPSE (M-mode): 2.2 cm LEFT ATRIUM             Index         RIGHT ATRIUM           Index LA diam:        2.70 cm 1.65 cm/m   RA Area:     18.70 cm LA Vol (A2C):   26.2 ml 15.98 ml/m  RA Volume:   54.80 ml  33.42 ml/m LA Vol (A4C):   54.5 ml 33.24 ml/m LA Biplane Vol: 38.3 ml 23.36 ml/m  AORTIC VALVE                    PULMONIC VALVE AV Area (Vmax):    2.23 cm     PV Vmax:          0.78 m/s AV Area (Vmean):   2.11 cm     PV Vmean:         52.700 cm/s AV Area (VTI):     1.91 cm     PV VTI:           0.184 m AV Vmax:           106.00 cm/s  PV Peak grad:     2.4 mmHg AV Vmean:          68.800 cm/s  PV Mean grad:     1.0 mmHg AV VTI:            0.261 m      PR End Diast Vel: 2.65 msec AV Peak Grad:      4.5 mmHg     RVOT Peak grad:   1 mmHg AV Mean Grad:      2.0 mmHg LVOT Vmax:         104.00 cm/s LVOT Vmean:        63.900 cm/s LVOT VTI:          0.220 m LVOT/AV VTI ratio: 0.84  AORTA Ao Root diam: 2.90 cm Ao Asc diam:  3.10 cm MITRAL VALVE               TRICUSPID VALVE MV Area (PHT): 2.54 cm    TR Peak grad:   63.7 mmHg MV Area VTI:   1.50 cm    TR Mean grad:   27.0 mmHg MV Peak grad:  4.2 mmHg    TR Vmax:        399.00 cm/s MV Mean grad:  1.0 mmHg    TR Vmean:       251.0 cm/s MV Vmax:       1.02 m/s MV Vmean:      47.1 cm/s   SHUNTS MV Decel Time: 299 msec    Systemic VTI:  0.22 m MV E velocity: 56.60 cm/s  Systemic Diam: 1.70 cm MV A velocity: 83.30 cm/s  Pulmonic VTI:  0.114 m MV E/A ratio:  0.68 Dwayne D Callwood MD Electronically signed by Cara JONETTA Lovelace MD Signature Date/Time: 06/11/2024/8:17:45 PM    Final    CT Angio Chest Pulmonary Embolism (PE) W or WO Contrast Result Date: 06/11/2024 EXAM: CTA of the Chest with contrast for PE 06/11/2024 01:13:52 AM TECHNIQUE: CTA of the chest was performed after the administration of 75 mL of iohexol  (OMNIPAQUE ) 350 MG/ML injection. Multiplanar reformatted images are provided for review. MIP images are provided for review. Automated exposure control, iterative reconstruction, and/or weight based adjustment of  the mA/kV was utilized to reduce the radiation dose to as low as reasonably achievable. COMPARISON: Prior examination 12/2023 and more remote prior examination of 10/26/2023. CLINICAL HISTORY: Pulmonary embolism (PE) suspected, high prob. Breast cancer. *tracking code: Bo* FINDINGS: PULMONARY ARTERIES: Pulmonary arteries are adequately opacified for evaluation. No pulmonary embolism. The central pulmonary arteries are enlarged in keeping with changes of pulmonary arterial hypertension. Main pulmonary artery is normal in caliber. MEDIASTINUM: Mild coronary artery calcification. Mild cardiomegaly with asymmetric enlargement of the right heart chambers in keeping with elevated right heart pressure. No pericardial effusion. Mild atherosclerotic calcifications within the thoracic aorta. No aortic aneurysm. LYMPH NODES: Pathologic mediastinal and bilateral hilar adenopathy is present with the index lymph node measuring 17 mm in short axis diameter within the subcarinal lymph node group and 18 mm in diameter within the right hilar lymph node groove (54/4). These appear stable since the prior examination 12/2023 and more remote prior examination of 10/26/2023 possible reflecting the sequela of treated disease, and inflammatory processes such as sarcoidosis, or a low-grade lymphoproliferative process. LUNGS AND PLEURA: Mild diffuse ground-glass pulmonary opacity is again seen demonstrating a basilar predominance, progressive since prior examination and most suggestive of mild alveolar pulmonary edema. No pneumothorax or pleural effusion. UPPER ABDOMEN: Limited images of the upper abdomen are unremarkable. SOFT TISSUES AND BONES: No acute bone or soft tissue abnormality. IMPRESSION: 1. No pulmonary embolism. 2. Cardiomegaly with asymmetric enlargement of the right heart chambers been with elevated right heart pressure. 3. Morphologic changes in keeping with pulmonary arterial hypertension. 4. Mild diffuse ground-glass  pulmonary opacity with basilar predominance, progressive since prior examination, most suggestive of mild alveolar pulmonary edema. 5. Pathologic mediastinal and bilateral hilar adenopathy, stable since prior examination, possibly reflecting sequela of treated disease, inflammatory processes such as sarcoidosis, or a low-grade lymphoproliferative process. Electronically signed by: Dorethia Molt MD 06/11/2024 01:25 AM EST RP Workstation: [id number]   DG Chest 2 View Result Date: 06/11/2024 EXAM: 2 VIEW(S) XRAY OF THE CHEST 06/10/2024 11:56:00 PM COMPARISON: 05/26/2024 CLINICAL HISTORY: cough, chest pain FINDINGS: LUNGS AND PLEURA: No focal pulmonary opacity. No pleural effusion. No pneumothorax. HEART AND MEDIASTINUM: No acute abnormality of the cardiac and mediastinal silhouettes. BONES AND SOFT TISSUES: Left axillary surgical clips noted. Thoracolumbar scoliosis stable. No acute osseous abnormality. IMPRESSION: 1. No acute cardiopulmonary process. Electronically signed by: Norman Gatlin MD 06/11/2024 12:05 AM EST RP Workstation: [id number]    Assessment and Plan SAUDIA SMYSER is a 78 y.o. female with medical history significant for diabetes mellitus type 2, hypertension, COPD, former smoker, history of breast cancer status post chemoradiation, exertional dyspnea being worked up by pulmonary as outpatient with prior CT showing pulmonary fibrosis mediastinal adenopathy and pulmonary hypertension presented to hospital with substernal chest pain  with shortness of breath.   proBNP elevated at 3081. CBC notable for mild leukocytosis of 12.2 and new thrombocytopenia of 92. Respiratory viral panel was negative for COVID influenza and RSV. EKG showed normal sinus rhythm with borderline T wave abnormalities.   CTA of the chest was negative for PE but showed cardiomegaly and alveolar pulmonary edema with midsternal hilar lymphadenopathy.    new onset CHF (congestive heart failure) acute diastolic in the setting  of ILD Patient presenting with new exertional shortness of breath, elevated proBNP, mild pulmonary edema on CTA chest.  Trial of Lasix  IV twice daily..   --Continue metoprolol .  Losartan  and Imdur  on hold due to borderline low blood pressure.   -- 2D echocardiogram EF 60-65%  Chest pain Nonobstructive CAD on coronary CTA 10/2023 (negative stress test 10/2023).  CTA of the chest was negative for PE but mediastinal lymphadenopathy.  Patient also had ventricular trigeminy.  Cardiology has been consulted.  Troponin 17 followed by 17 and not elevated.    Thrombocytopenia, new Mild leukocytosis Mild thrombocytopenia.  Peripheral blood smear with normal platelet morphology   Mediastinal lymphadenopathy Pulmonary hypertension Seen by pulmonary as outpatient.   COPD (chronic obstructive pulmonary disease) Continue DuoNebs.   History of breast cancer s/p chemoradiation Stable at this time   Diabetes mellitus  Continue sliding scale insulin , Accu-Cheks, diabetic diet.   Patient is from independent [facility]. She is otherwise independent and ambulates well according to her. At discharge to return back to her assisted living.   Procedures: Family communication : Consults : [facility] cardiology, pulmonary CODE STATUS: full DVT Prophylaxis : Level of care: Telemetry Status is: Inpatient Remains inpatient appropriate because: CHF    TOTAL TIME TAKING CARE OF THIS PATIENT: 35 minutes.  >50% time spent on counselling and coordination of care  Note: This dictation was prepared with Dragon dictation along with smaller phrase technology. Any transcriptional errors that result from this process are unintentional.  Leita Blanch M.D    [facility]   CC: Primary care physician; Sherial Bail, MD

## 2024-06-12 NOTE — Progress Notes (Signed)
 [facility] CLINIC CARDIOLOGY PROGRESS NOTE       Patient ID: [facility] MRN: [id number] DOB/AGE: 1946-04-02 78 y.o.  Admit date: 06/10/2024 Referring Physician Dr. Delayne Solian Primary Physician Michelle Bail, MD  Primary Cardiologist Dr. Ammon Reason for Consultation trigeminy, chest pain  HPI: [facility] is a 78 y.o. female  with a past medical history of palpitations, COPD, hypertension, hyperlipidemia, nonobstructive coronary disease by CTA 10/2023 who presented to the ED on 06/10/2024 for chest pain. Cardiology was consulted for further evaluation.   Interval history: - Patient seen and examined this morning, resting comfortably in hospital bed. - States that she is feeling about the same today, shortness of breath stable but she does appear more comfortable. - Denies any recurrence of chest pain.  BP controlled this a.m., heart rate has been borderline low.  Review of systems complete and found to be negative unless listed above    Past Medical History:  Diagnosis Date   Allergic rhinitis    Anxiety    Breast cancer (HCC) 1996   s/p left breast lumpectomy (lymph node dissection - 2/11 positive), chemotherapy   Breast cancer (HCC) 1995   right breast lumpectomy   Complication of anesthesia    slow to wake   Depression    Diabetes mellitus    Dysphagia    Family history of adverse reaction to anesthesia    Mother - slow to wake   GERD (gastroesophageal reflux disease)    H/O ulcer disease    PUD   Hypercholesterolemia    Hypertension    Hypothyroidism    multinodular goiter   Nephrolithiasis    Personal history of chemotherapy    Personal history of radiation therapy    Scoliosis    Skin cancer    SVT (supraventricular tachycardia)     Past Surgical History:  Procedure Laterality Date   ABDOMINAL HYSTERECTOMY     partial   BREAST BIOPSY Left 09/2012   benign   BREAST EXCISIONAL BIOPSY Right    1995 benign   BREAST LUMPECTOMY Left 1996    lumpectomy with chemo and rad tx for breast ca (lymph node dissection - 2/11 positive   BREAST LUMPECTOMY WITH AXILLARY LYMPH NODE DISSECTION  1996   left   CATARACT EXTRACTION W/PHACO Left 11/29/2020   Procedure: CATARACT EXTRACTION PHACO AND INTRAOCULAR LENS PLACEMENT (IOC) LEFT DIABETIC VIVITY TORIC;  Surgeon: Jaye Fallow, MD;  Location: [facility];  Service: Ophthalmology;  Laterality: Left;  Diabetic - oral meds 6.88 00:40.7   CATARACT EXTRACTION W/PHACO Right 12/13/2020   Procedure: CATARACT EXTRACTION PHACO AND INTRAOCULAR LENS PLACEMENT (IOC) RIGHT DIABETIC VIVITY TORIC 5.44 00:40.3;  Surgeon: Jaye Fallow, MD;  Location: [facility];  Service: Ophthalmology;  Laterality: Right;  Diabetic - oral meds   COLONOSCOPY WITH PROPOFOL  N/A 06/03/2019   Procedure: COLONOSCOPY WITH PROPOFOL ;  Surgeon: Dessa Reyes LELON, MD;  Location: [facility] ENDOSCOPY;  Service: Endoscopy;  Laterality: N/A;   ESOPHAGOGASTRODUODENOSCOPY N/A 06/03/2019   Procedure: ESOPHAGOGASTRODUODENOSCOPY (EGD);  Surgeon: Dessa Reyes LELON, MD;  Location: [facility] ENDOSCOPY;  Service: Endoscopy;  Laterality: N/A;   LUMBAR LAMINECTOMY  09/2005   L5-S1    Medications Prior to Admission  Medication Sig Dispense Refill Last Dose/Taking   acetaminophen  (TYLENOL ) 500 MG tablet Take 500 mg by mouth every 6 (six) hours as needed for mild pain (pain score 1-3).   Taking As Needed   albuterol  (VENTOLIN  HFA) 108 (90 Base) MCG/ACT inhaler Inhale 1-2 puffs into  the lungs every 4 (four) hours as needed.   Taking As Needed   aspirin  81 MG tablet Take 162 mg by mouth every evening.   Taking   atorvastatin  (LIPITOR) 20 MG tablet TAKE ONE TABLET EVERY DAY 90 tablet 2 Taking   Biotin 5 MG CAPS Take 5 mg by mouth daily.   Taking   calcium  citrate (CALCITRATE - DOSED IN MG ELEMENTAL CALCIUM ) 950 MG tablet Take 200 mg of elemental calcium  by mouth daily.   Taking   cyclobenzaprine (FLEXERIL) 10 MG tablet Take 10 mg by mouth  2 (two) times daily as needed.   Unknown   escitalopram  (LEXAPRO ) 20 MG tablet TAKE ONE TABLET EVERY DAY 90 tablet 1 Taking   fish oil-omega-3 fatty acids 1000 MG capsule Take 2 g by mouth daily.   Taking   fluticasone -salmeterol (ADVAIR) 250-50 MCG/ACT AEPB Inhale 1 puff into the lungs 2 (two) times daily.   Taking   levothyroxine  (SYNTHROID ) 25 MCG tablet TAKE 1 TABLET EVERY DAY ON EMPTY STOMACHWITH A GLASS OF WATER AT LEAST 30-60 MINBEFORE BREAKFAST 90 tablet 1 Taking   losartan  (COZAAR ) 100 MG tablet TAKE 1 TABLET BY MOUTH DAILY 90 tablet 1 Taking   metFORMIN  (GLUCOPHAGE -XR) 500 MG 24 hr tablet TAKE TWO TABLETS TWICE DAILY (Patient taking differently: Take 500-1,000 mg by mouth 2 (two) times daily. 500mg  qam and 1,000mg  qpm) 360 tablet 3 Taking Differently   metoprolol  succinate (TOPROL -XL) 50 MG 24 hr tablet TAKE 1 TABLET BY MOUTH DAILY WITH OR FOLLOWING A MEAL 90 tablet 1 Taking   TURMERIC PO Take by mouth daily.    Taking   Accu-Chek Softclix Lancets lancets Check blood sugar twice daily Dx 250.00 200 each 1    Blood Glucose Monitoring Suppl (ONE TOUCH ULTRA SYSTEM KIT) w/Device KIT 1 kit by Does not apply route once.      cholecalciferol (VITAMIN D) 400 UNITS TABS Take by mouth. (Patient not taking: Reported on 06/11/2024)   Not Taking   Continuous Blood Gluc Receiver (FREESTYLE LIBRE 14 DAY READER) DEVI Inject 1 Device into the skin every 14 (fourteen) days. Place 1 sensor every 14 days. Use to check sugar at least 4 times daily 6 each 3    Continuous Blood Gluc Sensor (FREESTYLE LIBRE 14 DAY SENSOR) MISC Inject 1 Device into the skin every 14 (fourteen) days. 6 each 3    ferrous sulfate 325 (65 FE) MG EC tablet Take 325 mg by mouth daily with breakfast. (Patient not taking: Reported on 06/11/2024)   Not Taking   glucose blood (ONE TOUCH ULTRA TEST) test strip CHECK BLOOD SUGAR UP TO THREE TIMES DAILY Dx E11.9 300 each 3    isosorbide  mononitrate (IMDUR ) 30 MG 24 hr tablet Take 1 tablet (30 mg  total) by mouth daily. (Patient not taking: Reported on 06/11/2024) 30 tablet 2 Not Taking   Multiple Vitamin (MULTIVITAMIN WITH MINERALS) TABS tablet Take 1 tablet by mouth daily. (Patient not taking: Reported on 06/11/2024)   Not Taking   omeprazole  (PRILOSEC) 40 MG capsule TAKE 1 CAPSULE EVERY DAY (Patient not taking: Reported on 06/11/2024) 90 capsule 1 Not Taking   Probiotic Product (PROBIOTIC DAILY PO) Take 1 tablet by mouth. (Patient not taking: Reported on 06/11/2024)   Not Taking   Social History   Socioeconomic History   Marital status: Widowed    Spouse name: Not on file   Number of children: Not on file   Years of education: Not on file  Highest education level: Not on file  Occupational History   Not on file  Tobacco Use   Smoking status: Former    Current packs/day: 0.00    Average packs/day: 0.5 packs/day for 40.0 years (20.0 ttl pk-yrs)    Types: Cigarettes    Start date: 04/14/1964    Quit date: 04/14/2004    Years since quitting: 20.1   Smokeless tobacco: Never  Vaping Use   Vaping status: Never Used  Substance and Sexual Activity   Alcohol use: No    Alcohol/week: 0.0 standard drinks of alcohol   Drug use: No   Sexual activity: Never  Other Topics Concern   Not on file  Social History Narrative   Not on file   Social Drivers of Health   Financial Resource Strain: Low Risk  (11/21/2023)   Received from [facility]   Overall Financial Resource Strain (CARDIA)    Difficulty of Paying Living Expenses: Not hard at all  Food Insecurity: No Food Insecurity (06/12/2024)   Hunger Vital Sign    Worried About Running Out of Food in the Last Year: Never true    Ran Out of Food in the Last Year: Never true  Transportation Needs: No Transportation Needs (06/12/2024)   PRAPARE - Administrator, Civil Service (Medical): No    Lack of Transportation (Non-Medical): No  Physical Activity: Not on file  Stress: Not on file  Social Connections:  Moderately Integrated (06/12/2024)   Social Connection and Isolation Panel    Frequency of Communication with Friends and Family: More than three times a week    Frequency of Social Gatherings with Friends and Family: More than three times a week    Attends Religious Services: More than 4 times per year    Active Member of [employer] or Organizations: Yes    Attends Banker Meetings: More than 4 times per year    Marital Status: Widowed  Intimate Partner Violence: Not At Risk (06/12/2024)   Humiliation, Afraid, Rape, and Kick questionnaire    Fear of Current or Ex-Partner: No    Emotionally Abused: No    Physically Abused: No    Sexually Abused: No    Family History  Problem Relation Age of Onset   Alzheimer's disease Mother    Heart disease Father    Hypertension Father    Diabetes Father    Diabetes Sister    Diabetes Sister    Diabetes Sister    Diabetes Sister    Breast cancer Neg Hx      Vitals:   06/12/24 0002 06/12/24 0500 06/12/24 0503 06/12/24 0720  BP: 120/61  (!) 113/59 119/63  Pulse: (!) 56  (!) 52 (!) 52  Resp: 20  20 20   Temp: 98.1 F (36.7 C)  98.4 F (36.9 C) 97.7 F (36.5 C)  TempSrc:    Oral  SpO2: 96%  97% 96%  Weight:  59.8 kg    Height:        PHYSICAL EXAM General: Chronically ill-appearing elderly female, well nourished, in no acute distress. HEENT: Normocephalic and atraumatic. Neck: No JVD.  Lungs: Normal respiratory effort on 5L [place]. Clear bilaterally to auscultation. No wheezes, crackles, rhonchi.  Heart: HRRR. Normal S1 and S2 without gallops or murmurs.  Abdomen: Non-distended appearing.  Msk: Normal strength and tone for age. Extremities: Warm and well perfused. No clubbing, cyanosis.  No edema.  Neuro: Alert and oriented X 3. Psych: Answers questions appropriately.  Labs: Basic Metabolic Panel: Recent Labs    06/11/24 0012 06/11/24 2309 06/12/24 0409  NA 136  --  131*  K 4.4  --  4.1  CL 99  --  97*  CO2 25  --  24   GLUCOSE 117* 406* 267*  BUN 19  --  20  CREATININE 1.00  --  0.92  CALCIUM  9.4  --  9.7  MG  --   --  1.8   Liver Function Tests: Recent Labs    06/11/24 0012  AST 34  ALT 29  ALKPHOS 95  BILITOT 0.5  PROT 7.4  ALBUMIN  4.2   No results for input(s): LIPASE, AMYLASE in the last 72 hours. CBC: Recent Labs    06/11/24 0600 06/12/24 0409  WBC 15.1* 8.1  NEUTROABS 11.7*  --   HGB 12.0 13.0  HCT 37.8 39.1  MCV 89.4 87.1  PLT 123* PLATELET CLUMPS NOTED ON SMEAR, UNABLE TO ESTIMATE   Cardiac Enzymes: No results for input(s): CKTOTAL, CKMB, CKMBINDEX, TROPONINIHS in the last 72 hours. BNP: No results for input(s): BNP in the last 72 hours. D-Dimer: No results for input(s): DDIMER in the last 72 hours. Hemoglobin A1C: Recent Labs    06/11/24 0518  HGBA1C 6.8*   Fasting Lipid Panel: No results for input(s): CHOL, HDL, LDLCALC, TRIG, CHOLHDL, LDLDIRECT in the last 72 hours. Thyroid  Function Tests: No results for input(s): TSH, T4TOTAL, T3FREE, THYROIDAB in the last 72 hours.  Invalid input(s): FREET3 Anemia Panel: No results for input(s): VITAMINB12, FOLATE, FERRITIN, TIBC, IRON , RETICCTPCT in the last 72 hours.   Radiology: ECHOCARDIOGRAM COMPLETE Result Date: 06/11/2024    ECHOCARDIOGRAM REPORT   Patient Name:   BRENNAH QURAISHI Date of Exam: 06/11/2024 Medical Rec #:  [id number]    Height:       64.0 in Accession #:    [id number]   Weight:       132.0 lb Date of Birth:  06-13-1946    BSA:          1.640 m Patient Age:    78 years     BP:           122/93 mmHg Patient Gender: F            HR:           50 bpm. Exam Location:  [facility] Procedure: 2D Echo, Color Doppler, Cardiac Doppler and Intracardiac            Opacification Agent (Both Spectral and Color Flow Doppler were            utilized during procedure). Indications:     CHF-Acute Diastolic I50.31  History:         Patient has prior history of Echocardiogram examinations,  most                  recent 10/27/2023. CHF.  Sonographer:     Ashley McNeely-Sloane Referring Phys:  [id number] DELAYNE LULLA SOLIAN Diagnosing Phys: Cara JONETTA Lovelace MD IMPRESSIONS  1. Left ventricular ejection fraction, by estimation, is 60 to 65%. The left ventricle has normal function. The left ventricle has no regional wall motion abnormalities. Left ventricular diastolic parameters are consistent with Grade I diastolic dysfunction (impaired relaxation). There is the interventricular septum is flattened in diastole ('D' shaped left ventricle), consistent with right ventricular volume overload.  2. Right ventricular systolic function is mildly reduced. The right ventricular size is moderately enlarged.  3. The mitral valve  is grossly normal. Moderate mitral valve regurgitation.  4. The aortic valve is normal in structure. Aortic valve regurgitation is not visualized. FINDINGS  Left Ventricle: Left ventricular ejection fraction, by estimation, is 60 to 65%. The left ventricle has normal function. The left ventricle has no regional wall motion abnormalities. Definity  contrast agent was given IV to delineate the left ventricular  endocardial borders. Strain was performed and the global longitudinal strain is indeterminate. The left ventricular internal cavity size was normal in size. There is no left ventricular hypertrophy. The interventricular septum is flattened in diastole ('D' shaped left ventricle), consistent with right ventricular volume overload. Left ventricular diastolic parameters are consistent with Grade I diastolic dysfunction (impaired relaxation). Right Ventricle: The right ventricular size is moderately enlarged. No increase in right ventricular wall thickness. Right ventricular systolic function is mildly reduced. Left Atrium: Left atrial size was normal in size. Right Atrium: Right atrial size was normal in size. Pericardium: There is no evidence of pericardial effusion. Mitral Valve: The mitral valve  is grossly normal. Moderate mitral valve regurgitation. MV peak gradient, 4.2 mmHg. The mean mitral valve gradient is 1.0 mmHg. Tricuspid Valve: The tricuspid valve is normal in structure. Tricuspid valve regurgitation is mild. Aortic Valve: The aortic valve is normal in structure. Aortic valve regurgitation is not visualized. Aortic valve mean gradient measures 2.0 mmHg. Aortic valve peak gradient measures 4.5 mmHg. Aortic valve area, by VTI measures 1.91 cm. Pulmonic Valve: The pulmonic valve was normal in structure. Pulmonic valve regurgitation is not visualized. Aorta: The ascending aorta was not well visualized. IAS/Shunts: No atrial level shunt detected by color flow Doppler. Additional Comments: 3D was performed not requiring image post processing on an independent workstation and was indeterminate.  LEFT VENTRICLE PLAX 2D LVIDd:         3.90 cm     Diastology LVIDs:         2.00 cm     LV e' medial:    4.79 cm/s LV PW:         1.00 cm     LV E/e' medial:  11.8 LV IVS:        1.00 cm     LV e' lateral:   5.55 cm/s LVOT diam:     1.70 cm     LV E/e' lateral: 10.2 LV SV:         50 LV SV Index:   30 LVOT Area:     2.27 cm  LV Volumes (MOD) LV vol d, MOD A2C: 45.7 ml LV vol d, MOD A4C: 49.6 ml LV vol s, MOD A2C: 15.7 ml LV vol s, MOD A4C: 16.7 ml LV SV MOD A2C:     30.0 ml LV SV MOD A4C:     49.6 ml LV SV MOD BP:      31.3 ml RIGHT VENTRICLE            IVC RV Basal diam:  4.10 cm    IVC diam: 2.70 cm RV Mid diam:    3.70 cm RV S prime:     9.57 cm/s TAPSE (M-mode): 2.2 cm LEFT ATRIUM             Index        RIGHT ATRIUM           Index LA diam:        2.70 cm 1.65 cm/m   RA Area:     18.70 cm LA Vol (A2C):   26.2  ml 15.98 ml/m  RA Volume:   54.80 ml  33.42 ml/m LA Vol (A4C):   54.5 ml 33.24 ml/m LA Biplane Vol: 38.3 ml 23.36 ml/m  AORTIC VALVE                    PULMONIC VALVE AV Area (Vmax):    2.23 cm     PV Vmax:          0.78 m/s AV Area (Vmean):   2.11 cm     PV Vmean:         52.700 cm/s AV Area  (VTI):     1.91 cm     PV VTI:           0.184 m AV Vmax:           106.00 cm/s  PV Peak grad:     2.4 mmHg AV Vmean:          68.800 cm/s  PV Mean grad:     1.0 mmHg AV VTI:            0.261 m      PR End Diast Vel: 2.65 msec AV Peak Grad:      4.5 mmHg     RVOT Peak grad:   1 mmHg AV Mean Grad:      2.0 mmHg LVOT Vmax:         104.00 cm/s LVOT Vmean:        63.900 cm/s LVOT VTI:          0.220 m LVOT/AV VTI ratio: 0.84  AORTA Ao Root diam: 2.90 cm Ao Asc diam:  3.10 cm MITRAL VALVE               TRICUSPID VALVE MV Area (PHT): 2.54 cm    TR Peak grad:   63.7 mmHg MV Area VTI:   1.50 cm    TR Mean grad:   27.0 mmHg MV Peak grad:  4.2 mmHg    TR Vmax:        399.00 cm/s MV Mean grad:  1.0 mmHg    TR Vmean:       251.0 cm/s MV Vmax:       1.02 m/s MV Vmean:      47.1 cm/s   SHUNTS MV Decel Time: 299 msec    Systemic VTI:  0.22 m MV E velocity: 56.60 cm/s  Systemic Diam: 1.70 cm MV A velocity: 83.30 cm/s  Pulmonic VTI:  0.114 m MV E/A ratio:  0.68 Dwayne D Callwood MD Electronically signed by Cara JONETTA Lovelace MD Signature Date/Time: 06/11/2024/8:17:45 PM    Final    CT Angio Chest Pulmonary Embolism (PE) W or WO Contrast Result Date: 06/11/2024 EXAM: CTA of the Chest with contrast for PE 06/11/2024 01:13:52 AM TECHNIQUE: CTA of the chest was performed after the administration of 75 mL of iohexol  (OMNIPAQUE ) 350 MG/ML injection. Multiplanar reformatted images are provided for review. MIP images are provided for review. Automated exposure control, iterative reconstruction, and/or weight based adjustment of the mA/kV was utilized to reduce the radiation dose to as low as reasonably achievable. COMPARISON: Prior examination 12/2023 and more remote prior examination of 10/26/2023. CLINICAL HISTORY: Pulmonary embolism (PE) suspected, high prob. Breast cancer. *tracking code: Bo* FINDINGS: PULMONARY ARTERIES: Pulmonary arteries are adequately opacified for evaluation. No pulmonary embolism. The central pulmonary  arteries are enlarged in keeping with changes of pulmonary arterial hypertension. Main pulmonary artery is normal in caliber. MEDIASTINUM:  Mild coronary artery calcification. Mild cardiomegaly with asymmetric enlargement of the right heart chambers in keeping with elevated right heart pressure. No pericardial effusion. Mild atherosclerotic calcifications within the thoracic aorta. No aortic aneurysm. LYMPH NODES: Pathologic mediastinal and bilateral hilar adenopathy is present with the index lymph node measuring 17 mm in short axis diameter within the subcarinal lymph node group and 18 mm in diameter within the right hilar lymph node groove (54/4). These appear stable since the prior examination 12/2023 and more remote prior examination of 10/26/2023 possible reflecting the sequela of treated disease, and inflammatory processes such as sarcoidosis, or a low-grade lymphoproliferative process. LUNGS AND PLEURA: Mild diffuse ground-glass pulmonary opacity is again seen demonstrating a basilar predominance, progressive since prior examination and most suggestive of mild alveolar pulmonary edema. No pneumothorax or pleural effusion. UPPER ABDOMEN: Limited images of the upper abdomen are unremarkable. SOFT TISSUES AND BONES: No acute bone or soft tissue abnormality. IMPRESSION: 1. No pulmonary embolism. 2. Cardiomegaly with asymmetric enlargement of the right heart chambers been with elevated right heart pressure. 3. Morphologic changes in keeping with pulmonary arterial hypertension. 4. Mild diffuse ground-glass pulmonary opacity with basilar predominance, progressive since prior examination, most suggestive of mild alveolar pulmonary edema. 5. Pathologic mediastinal and bilateral hilar adenopathy, stable since prior examination, possibly reflecting sequela of treated disease, inflammatory processes such as sarcoidosis, or a low-grade lymphoproliferative process. Electronically signed by: Dorethia Molt MD 06/11/2024  01:25 AM EST RP Workstation: [id number]   DG Chest 2 View Result Date: 06/11/2024 EXAM: 2 VIEW(S) XRAY OF THE CHEST 06/10/2024 11:56:00 PM COMPARISON: 05/26/2024 CLINICAL HISTORY: cough, chest pain FINDINGS: LUNGS AND PLEURA: No focal pulmonary opacity. No pleural effusion. No pneumothorax. HEART AND MEDIASTINUM: No acute abnormality of the cardiac and mediastinal silhouettes. BONES AND SOFT TISSUES: Left axillary surgical clips noted. Thoracolumbar scoliosis stable. No acute osseous abnormality. IMPRESSION: 1. No acute cardiopulmonary process. Electronically signed by: Norman Gatlin MD 06/11/2024 12:05 AM EST RP Workstation: [id number]   NM Pulmonary Perfusion Result Date: 05/26/2024 CLINICAL DATA:  Worsening shortness of breath for 6 months. Elevated D-dimer levels. EXAM: NUCLEAR MEDICINE PERFUSION LUNG SCAN TECHNIQUE: Perfusion images were obtained in multiple projections after intravenous injection of radiopharmaceutical. No ventilation imaging performed. RADIOPHARMACEUTICALS:  3.86 mCi Tc-27m MAA IV COMPARISON:  Chest radiographs 05/26/2024, noncontrast chest CT 05/14/2024 and chest CTA 10/26/2023 FINDINGS: There is patchy decreased perfusion in both upper lobes, most likely secondary to the emphysema demonstrated on the patient's CT. There are no wedge-shaped perfusion defects to suggest acute pulmonary embolism. IMPRESSION: 1. No evidence of acute pulmonary embolism on perfusion scintigraphy by PISAPED criteria. 2. Decreased perfusion at both lung apices, likely due to emphysema. Electronically Signed   By: Elsie Perone M.D.   On: 05/26/2024 13:50   DG Chest 2 View Result Date: 05/26/2024 CLINICAL DATA:  Shortness of breath. EXAM: CHEST - 2 VIEW COMPARISON:  Chest radiograph dated 10/26/2023. FINDINGS: No focal consolidation, pleural effusion or pneumothorax. The cardiac silhouette is within normal limits. No acute osseous pathology. Degenerative changes of the spine. Left axillary surgical  clips. IMPRESSION: No active cardiopulmonary disease. Electronically Signed   By: Vanetta Chou M.D.   On: 05/26/2024 13:02   CT CHEST WO CONTRAST Result Date: 05/19/2024 CLINICAL DATA:  Shortness of breath, former smoker, history of left breast cancer with lumpectomy, radiation, and chemotherapy * Tracking Code: BO * EXAM: CT CHEST WITHOUT CONTRAST TECHNIQUE: Multidetector CT imaging of the chest was performed following the standard protocol without IV contrast.  RADIATION DOSE REDUCTION: This exam was performed according to the departmental dose-optimization program which includes automated exposure control, adjustment of the mA and/or kV according to patient size and/or use of iterative reconstruction technique. COMPARISON:  10/26/2023 FINDINGS: Cardiovascular: Aortic atherosclerosis. Mild cardiomegaly. Left coronary artery calcifications. Enlargement of the main pulmonary artery measuring up to 3.8 cm in caliber. No pericardial effusion. Mediastinum/Nodes: No enlarged mediastinal, hilar, or axillary lymph nodes. Thyroid  gland, trachea, and esophagus demonstrate no significant findings. Lungs/Pleura: Background of fine centrilobular nodularity most concentrated in the lung apices. Mild, bibasilar predominant pulmonary fibrosis featuring irregular peripheral interstitial opacity, septal thickening, and minimal traction bronchiectasis without clear evidence of subpleural bronchiolectasis or honeycombing. No pleural effusion or pneumothorax. Upper Abdomen: No acute abnormality. Gallstone. Coarse contour of the liver. Musculoskeletal: Left lumpectomy and axillary lymph node dissection. No acute osseous findings. IMPRESSION: 1. Mild, bibasilar predominant pulmonary fibrosis featuring irregular peripheral interstitial opacity, septal thickening, and minimal traction bronchiectasis without clear evidence of subpleural bronchiolectasis or honeycombing. 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 Am JINNY Honey Crit Care Med Vol 198, Iss 5, [phone], Mar 09 2017. 2. Background of fine centrilobular nodularity most concentrated in the lung apices, most commonly seen in smoking-related respiratory bronchiolitis. 3. Enlargement of the main pulmonary artery, as can be seen in pulmonary hypertension. 4. Coarse contour of the liver, suggestive of cirrhosis. Correlate with biochemical findings. 5. Cholelithiasis. Aortic Atherosclerosis (ICD10-I70.0). Electronically Signed   By: Marolyn JONETTA Jaksch M.D.   On: 05/19/2024 20:43    ECHO as above  TELEMETRY (personally reviewed): sinus rhythm rate 50s, occasional PVCs  EKG (personally reviewed): Sinus bradycardia rate 57 bpm  Data reviewed by me 06/12/2024: last 24h vitals tele labs imaging I/O ED provider note, admission H&P  Principal Problem:   Chest pain Active Problems:   Diabetes mellitus (HCC)   History of breast cancer s/p chemoradiation   Possible new onset CHF (congestive heart failure) (HCC)   COPD (chronic obstructive pulmonary disease) (HCC)   Thrombocytopenia, new   Mediastinal lymphadenopathy   Ventricular trigeminy    ASSESSMENT AND PLAN:  HAVEN PYLANT is a 78 y.o. female  with a past medical history of palpitations, COPD, hypertension, hyperlipidemia, nonobstructive coronary disease by CTA 10/2023 who presented to the ED on 06/10/2024 for chest pain. Cardiology was consulted for further evaluation.   # Noncardiac chest pain # Occasional PVCs # COPD Patient presented after episode of chest discomfort last night which started while watching TV, troponin is normal x 2.  EKG without acute ischemic changes.  Had CTA coronary earlier this year which revealed nonobstructive coronary artery disease.  Episode of trigeminy overnight last night, now with occasional PVCs noted on telemetry.  She has known history of this.  Echo this  admission with EF 60 to 65%, no wall motion abnormalities, grade 1 diastolic dysfunction, intraventricular septum flattening in diastole, mildly reduced RV function with moderate enlargement, moderate MR.  CTA chest on admission was negative for PE. - Currently home BP medications are being held due to hypotension, I have decreased dose of metoprolol  due to bradycardia. - Agree with IV Lasix  20 mg twice daily. - Continue atorvastatin  20 mg daily. - No plan for further cardiac diagnostics.    This patient's plan of care was discussed and created with Dr. Florencio and he is in agreement.  Signed: Danita Bloch, PA-C  06/12/2024, 10:34 AM [facility]

## 2024-06-12 NOTE — Plan of Care (Signed)

## 2024-06-12 NOTE — TOC Initial Note (Signed)
 Transition of Care [facility]) - Initial/Assessment Note    Patient Details  Name: Michelle Hawkins MRN: [id number] Date of Birth: May 28, 1946  [facility]) CM/SW Contact:    Alfonso Rummer, LCSW Phone Number: 06/12/2024, 2:41 PM  Clinical Narrative:                    KEN DELENA Rummer completed TOC visit at bedside in room 231. Pt reports she lives alone, spouse passed away 4 years ago. Pt reports she is has adequate social support. Pt reports she has adequate transportation to medical appts. Pt does not need assistance to afford medication.      Patient Goals and CMS Choice            Expected Discharge Plan and Services      Home                                        Prior Living Arrangements/Services                       Activities of Daily Living   ADL Screening (condition at time of admission) Independently performs ADLs?: Yes (appropriate for developmental age) Is the patient deaf or have difficulty hearing?: No Does the patient have difficulty seeing, even when wearing glasses/contacts?: Yes Does the patient have difficulty concentrating, remembering, or making decisions?: No  Permission Sought/Granted                  Emotional Assessment              Admission diagnosis:  Acute CHF (congestive heart failure) (HCC) [I50.9] Dyspnea, unspecified type [R06.00] Chest pain, unspecified type [R07.9] Patient Active Problem List   Diagnosis Date Noted   Possible new onset CHF (congestive heart failure) (HCC) 06/11/2024   COPD (chronic obstructive pulmonary disease) (HCC) 06/11/2024   Thrombocytopenia, new 06/11/2024   Mediastinal lymphadenopathy 06/11/2024   Ventricular trigeminy 06/11/2024   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   Elevated TSH 03/17/2024   SOB (shortness of breath) 10/26/2023   Chest pain 10/26/2023   Hypoxia 10/26/2023   Fall 01/11/2020   Unsteadiness 01/11/2020   Sinusitis 07/27/2018   Neck pain  11/14/2016   Health care maintenance 10/18/2014   Iron  deficiency anemia 03/31/2014   Vaginal irritation 01/02/2014   Thrombocytosis 01/02/2014   Cough 08/23/2013   Hyponatremia 07/09/2012   Hypertension 04/14/2012   Hypercholesterolemia 04/14/2012   GERD (gastroesophageal reflux disease) 04/14/2012   Diabetes mellitus (HCC) 04/14/2012   History of breast cancer s/p chemoradiation 04/14/2012   Hypothyroidism 04/14/2012   PCP:  Sherial Bail, MD Pharmacy:   [facility] - [place], [place] - [address] [place] [address] Phone: [phone] Fax: [phone]  [facility] [id number] [place], [place] - [address] [place] [address] Phone: [phone] Fax: [phone]     Social Drivers of Health (SDOH) Social History: SDOH Screenings   Food Insecurity: No Food Insecurity (06/12/2024)  Housing: Low Risk  (06/12/2024)  Transportation Needs: No Transportation Needs (06/12/2024)  Utilities: Not At Risk (06/12/2024)  Financial Resource Strain: Low Risk  (11/21/2023)   Received from [facility]  Social Connections: Moderately Integrated (06/12/2024)  Tobacco Use: Medium Risk (06/08/2024)   Received from [facility]   SDOH  Interventions:     Readmission Risk Interventions     No data to display

## 2024-06-13 DIAGNOSIS — J449 Chronic obstructive pulmonary disease, unspecified: Secondary | ICD-10-CM | POA: Diagnosis not present

## 2024-06-13 DIAGNOSIS — R0789 Other chest pain: Secondary | ICD-10-CM | POA: Diagnosis not present

## 2024-06-13 DIAGNOSIS — I5021 Acute systolic (congestive) heart failure: Secondary | ICD-10-CM | POA: Diagnosis not present

## 2024-06-13 DIAGNOSIS — D696 Thrombocytopenia, unspecified: Secondary | ICD-10-CM | POA: Diagnosis not present

## 2024-06-13 LAB — BASIC METABOLIC PANEL WITH GFR
Anion gap: 14 (ref 5–15)
BUN: 23 mg/dL (ref 8–23)
CO2: 24 mmol/L (ref 22–32)
Calcium: 9.3 mg/dL (ref 8.9–10.3)
Chloride: 97 mmol/L — ABNORMAL LOW (ref 98–111)
Creatinine, Ser: 0.96 mg/dL (ref 0.44–1.00)
GFR, Estimated: 60 mL/min — ABNORMAL LOW (ref >60)
Glucose, Bld: 122 mg/dL — ABNORMAL HIGH (ref 70–99)
Potassium: 3.2 mmol/L — ABNORMAL LOW (ref 3.5–5.1)
Sodium: 135 mmol/L (ref 135–145)

## 2024-06-13 LAB — GLUCOSE, CAPILLARY
Glucose-Capillary: 125 mg/dL — ABNORMAL HIGH (ref 70–99)
Glucose-Capillary: 160 mg/dL — ABNORMAL HIGH (ref 70–99)
Glucose-Capillary: 292 mg/dL — ABNORMAL HIGH (ref 70–99)
Glucose-Capillary: 367 mg/dL — ABNORMAL HIGH (ref 70–99)

## 2024-06-13 LAB — GLUCOSE, RANDOM: Glucose, Bld: 258 mg/dL — ABNORMAL HIGH (ref 70–99)

## 2024-06-13 MED ORDER — MIDODRINE HCL 5 MG PO TABS: 2.5000 mg | ORAL_TABLET | Freq: Three times a day (TID) | ORAL | Status: DC

## 2024-06-13 NOTE — Progress Notes (Signed)
 PULMONOLOGY         Date: 06/13/2024,   MRN# [id number] Michelle Hawkins 1946-04-23     AdmissionWeight: 59.9 kg                 CurrentWeight: 58.6 kg  Referring provider: Dr Sonjia   CHIEF COMPLAINT:   Progressive dyspnea on exertion with chest pain   HISTORY OF PRESENT ILLNESS   This is a 78 year old female with a history of diabetes essential hypertension COPD was a previous smoker, history of breast cancer status post chemo and radiation therapy who was seen by me previously in pulmonology clinic for an outpatient.  She is presented to the emergency department with chest discomfort and dyspnea.  She had a CT chest in the past most recently May 14, 2024 which showed peripheral basilar predominant reticular and septal thickening with associated traction bronchiectasis minimal honeycombing.  There was no pleural effusion, pneumothorax, suspicious appearing nodules, residual scarring from left lumpectomy and axillary lymph node dissection.  Overall findings were suggestive of possible interstitial lung disease.  She also had a VQ scan performed November 18 which was negative for any acute pulmonary embolism or perfusion defect.  She did have matched decreased perfusion at both lung apices likely due to underlying emphysema.  On this admission she had a repeat CT with PE protocol with similar findings as before including mild cardiomegaly and asymmetric RV enlargement, mild hilar and mediastinal adenopathy which appears stable from June 2025 as well as diffuse ground glass opacification suggestive of alveolar pulmonary edema.  Her proBNP was over 3000 on this admission suggestive of cardiac etiology of interstitial pulmonary edema.  Her CMP is essentially unremarkable besides a mild decrement in GFR at 57.  Viral testing for flu, RSV and COVID-19 are negative.  PCCM consultation for further evaluation management.   06/12/24- patient reports minimal improvement clinically. She has  elevated CRP which seems to be due to ILD exacerbation. She will need 1000mg  solumedrol x 3 days Then taper.  06/13/24-  patient appears improved today.  She had first dose of pulsed 1g solumedrol this am at 10am.  Her sugars are only slightly higher and Im rechecking them again this afternoon.  We plan to walk today for chest physiotherapy.  She feels well enough to shower and do her hair and is smiling during interview. We discussed her pulmonary hypertension and have contacted cardiology who has seen patient and may be able to perform cardiac cath while she is inpatient.    PAST MEDICAL HISTORY   Past Medical History:  Diagnosis Date   Allergic rhinitis    Anxiety    Breast cancer (HCC) 1996   s/p left breast lumpectomy (lymph node dissection - 2/11 positive), chemotherapy   Breast cancer (HCC) 1995   right breast lumpectomy   Complication of anesthesia    slow to wake   Depression    Diabetes mellitus    Dysphagia    Family history of adverse reaction to anesthesia    Mother - slow to wake   GERD (gastroesophageal reflux disease)    H/O ulcer disease    PUD   Hypercholesterolemia    Hypertension    Hypothyroidism    multinodular goiter   Nephrolithiasis    Personal history of chemotherapy    Personal history of radiation therapy    Scoliosis    Skin cancer    SVT (supraventricular tachycardia)      SURGICAL HISTORY  Past Surgical History:  Procedure Laterality Date   ABDOMINAL HYSTERECTOMY     partial   BREAST BIOPSY Left 09/2012   benign   BREAST EXCISIONAL BIOPSY Right    1995 benign   BREAST LUMPECTOMY Left 1996   lumpectomy with chemo and rad tx for breast ca (lymph node dissection - 2/11 positive   BREAST LUMPECTOMY WITH AXILLARY LYMPH NODE DISSECTION  1996   left   CATARACT EXTRACTION W/PHACO Left 11/29/2020   Procedure: CATARACT EXTRACTION PHACO AND INTRAOCULAR LENS PLACEMENT (IOC) LEFT DIABETIC VIVITY TORIC;  Surgeon: Jaye Fallow, MD;  Location:  [facility];  Service: Ophthalmology;  Laterality: Left;  Diabetic - oral meds 6.88 00:40.7   CATARACT EXTRACTION W/PHACO Right 12/13/2020   Procedure: CATARACT EXTRACTION PHACO AND INTRAOCULAR LENS PLACEMENT (IOC) RIGHT DIABETIC VIVITY TORIC 5.44 00:40.3;  Surgeon: Jaye Fallow, MD;  Location: [facility];  Service: Ophthalmology;  Laterality: Right;  Diabetic - oral meds   COLONOSCOPY WITH PROPOFOL  N/A 06/03/2019   Procedure: COLONOSCOPY WITH PROPOFOL ;  Surgeon: Dessa Reyes ORN, MD;  Location: [facility]COPY;  Service: Endoscopy;  Laterality: N/A;   ESOPHAGOGASTRODUODENOSCOPY N/A 06/03/2019   Procedure: ESOPHAGOGASTRODUODENOSCOPY (EGD);  Surgeon: Dessa Reyes ORN, MD;  Location: [facility] ENDOSCOPY;  Service: Endoscopy;  Laterality: N/A;   LUMBAR LAMINECTOMY  09/2005   L5-S1     FAMILY HISTORY   Family History  Problem Relation Age of Onset   Alzheimer's disease Mother    Heart disease Father    Hypertension Father    Diabetes Father    Diabetes Sister    Diabetes Sister    Diabetes Sister    Diabetes Sister    Breast cancer Neg Hx      SOCIAL HISTORY   Social History   Tobacco Use   Smoking status: Former    Current packs/day: 0.00    Average packs/day: 0.5 packs/day for 40.0 years (20.0 ttl pk-yrs)    Types: Cigarettes    Start date: 04/14/1964    Quit date: 04/14/2004    Years since quitting: 20.1   Smokeless tobacco: Never  Vaping Use   Vaping status: Never Used  Substance Use Topics   Alcohol use: No    Alcohol/week: 0.0 standard drinks of alcohol   Drug use: No     MEDICATIONS      Current Medication:  Current Facility-Administered Medications:    acetaminophen  (TYLENOL ) tablet 650 mg, 650 mg, Oral, Q6H PRN, 650 mg at 06/13/24 0004 **OR** acetaminophen  (TYLENOL ) suppository 650 mg, 650 mg, Rectal, Q6H PRN, Cleatus Delayne GAILS, MD   albuterol  (PROVENTIL ) (2.5 MG/3ML) 0.083% nebulizer solution 2.5 mg, 2.5 mg, Nebulization, Q2H PRN,  Cleatus Delayne GAILS, MD   atorvastatin  (LIPITOR) tablet 20 mg, 20 mg, Oral, Daily, Cleatus Delayne V, MD, 20 mg at 06/13/24 9093   azithromycin  (ZITHROMAX ) tablet 250 mg, 250 mg, Oral, Daily, Arless Vineyard, MD, 250 mg at 06/13/24 9093   enoxaparin  (LOVENOX ) injection 40 mg, 40 mg, Subcutaneous, Q24H, Patel, Sona, MD, 40 mg at 06/12/24 2111   furosemide  (LASIX ) injection 20 mg, 20 mg, Intravenous, BID, Cleatus Delayne V, MD, 20 mg at 06/13/24 0754   HYDROcodone -acetaminophen  (NORCO/VICODIN) 5-325 MG per tablet 1-2 tablet, 1-2 tablet, Oral, Q4H PRN, Cleatus Delayne GAILS, MD   insulin  aspart (novoLOG ) injection 0-5 Units, 0-5 Units, Subcutaneous, QHS, Cleatus Delayne GAILS, MD, 5 Units at 06/12/24 0058   insulin  aspart (novoLOG ) injection 0-9 Units, 0-9 Units, Subcutaneous, TID WC, Cleatus Delayne GAILS, MD, 1 Units at  06/13/24 0755   isosorbide  mononitrate (IMDUR ) 24 hr tablet 30 mg, 30 mg, Oral, Daily, Duncan, Hazel V, MD, 30 mg at 06/13/24 9093   levothyroxine  (SYNTHROID ) tablet 25 mcg, 25 mcg, Oral, Q0600, Duncan, Hazel V, MD, 25 mcg at 06/13/24 0753   losartan  (COZAAR ) tablet 100 mg, 100 mg, Oral, Daily, Cleatus Hoof V, MD, 100 mg at 06/13/24 0906   methylPREDNISolone  sodium succinate (SOLU-MEDROL ) 1,000 mg in sodium chloride  0.9 % 50 mL IVPB, 1,000 mg, Intravenous, Daily, Bralee Feldt, MD, Last Rate: 66 mL/hr at 06/13/24 0915, 1,000 mg at 06/13/24 0915   metoprolol  succinate (TOPROL -XL) 24 hr tablet 12.5 mg, 12.5 mg, Oral, Daily, Hudson, Caralyn, PA-C, 12.5 mg at 06/13/24 9093   midodrine  (PROAMATINE ) tablet 5 mg, 5 mg, Oral, TID WC, Martiza Speth, MD, 5 mg at 06/12/24 1639   ondansetron  (ZOFRAN ) tablet 4 mg, 4 mg, Oral, Q6H PRN **OR** ondansetron  (ZOFRAN ) injection 4 mg, 4 mg, Intravenous, Q6H PRN, Cleatus Hoof GAILS, MD    ALLERGIES   Patient has no known allergies.     REVIEW OF SYSTEMS    Review of Systems:  Gen:  Denies  fever, sweats, chills weigh loss  HEENT: Denies blurred vision, double  vision, ear pain, eye pain, hearing loss, nose bleeds, sore throat Cardiac:  No dizziness, chest pain or heaviness, chest tightness,edema Resp:   reports dyspnea chronically  Gi: Denies swallowing difficulty, stomach pain, nausea or vomiting, diarrhea, constipation, bowel incontinence Gu:  Denies bladder incontinence, burning urine Ext:   Denies Joint pain, stiffness or swelling Skin: Denies  skin rash, easy bruising or bleeding or hives Endoc:  Denies polyuria, polydipsia , polyphagia or weight change Psych:   Denies depression, insomnia or hallucinations   Other:  All other systems negative   VS: BP 133/67 (BP Location: Left Arm)   Pulse (!) 58   Temp 97.9 F (36.6 C)   Resp 16   Ht 5' 4 (1.626 m)   Wt 58.6 kg   SpO2 94%   BMI 22.18 kg/m      PHYSICAL EXAM    GENERAL:NAD, no fevers, chills, no weakness no fatigue HEAD: Normocephalic, atraumatic.  EYES: Pupils equal, round, reactive to light. Extraocular muscles intact. No scleral icterus.  MOUTH: Moist mucosal membrane. Dentition intact. No abscess noted.  EAR, NOSE, THROAT: Clear without exudates. No external lesions.  NECK: Supple. No thyromegaly. No nodules. No JVD.  PULMONARY: decreased breath sounds with mild rhonchi worse at bases bilaterally.  CARDIOVASCULAR: S1 and S2. Regular rate and rhythm. No murmurs, rubs, or gallops. No edema. Pedal pulses 2+ bilaterally.  GASTROINTESTINAL: Soft, nontender, nondistended. No masses. Positive bowel sounds. No hepatosplenomegaly.  MUSCULOSKELETAL: No swelling, clubbing, or edema. Range of motion full in all extremities.  NEUROLOGIC: Cranial nerves II through XII are intact. No gross focal neurological deficits. Sensation intact. Reflexes intact.  SKIN: No ulceration, lesions, rashes, or cyanosis. Skin warm and dry. Turgor intact.  PSYCHIATRIC: Mood, affect within normal limits. The patient is awake, alert and oriented x 3. Insight, judgment intact.       IMAGING    Narrative & Impression  EXAM: CTA of the Chest with contrast for PE 06/11/2024 01:13:52 AM   TECHNIQUE: CTA of the chest was performed after the administration of 75 mL of iohexol  (OMNIPAQUE ) 350 MG/ML injection. Multiplanar reformatted images are provided for review. MIP images are provided for review. Automated exposure control, iterative reconstruction, and/or weight based adjustment of the mA/kV was utilized to reduce the  radiation dose to as low as reasonably achievable.   COMPARISON: Prior examination 12/2023 and more remote prior examination of 10/26/2023.   CLINICAL HISTORY: Pulmonary embolism (PE) suspected, high prob. Breast cancer. *tracking code: Bo*   FINDINGS:   PULMONARY ARTERIES: Pulmonary arteries are adequately opacified for evaluation. No pulmonary embolism. The central pulmonary arteries are enlarged in keeping with changes of pulmonary arterial hypertension. Main pulmonary artery is normal in caliber.   MEDIASTINUM: Mild coronary artery calcification. Mild cardiomegaly with asymmetric enlargement of the right heart chambers in keeping with elevated right heart pressure. No pericardial effusion. Mild atherosclerotic calcifications within the thoracic aorta. No aortic aneurysm.   LYMPH NODES: Pathologic mediastinal and bilateral hilar adenopathy is present with the index lymph node measuring 17 mm in short axis diameter within the subcarinal lymph node group and 18 mm in diameter within the right hilar lymph node groove (54/4). These appear stable since the prior examination 12/2023 and more remote prior examination of 10/26/2023 possible reflecting the sequela of treated disease, and inflammatory processes such as sarcoidosis, or a low-grade lymphoproliferative process.   LUNGS AND PLEURA: Mild diffuse ground-glass pulmonary opacity is again seen demonstrating a basilar predominance, progressive since prior examination and most suggestive of mild  alveolar pulmonary edema. No pneumothorax or pleural effusion.   UPPER ABDOMEN: Limited images of the upper abdomen are unremarkable.   SOFT TISSUES AND BONES: No acute bone or soft tissue abnormality.   IMPRESSION: 1. No pulmonary embolism. 2. Cardiomegaly with asymmetric enlargement of the right heart chambers been with elevated right heart pressure. 3. Morphologic changes in keeping with pulmonary arterial hypertension. 4. Mild diffuse ground-glass pulmonary opacity with basilar predominance, progressive since prior examination, most suggestive of mild alveolar pulmonary edema. 5. Pathologic mediastinal and bilateral hilar adenopathy, stable since prior examination, possibly reflecting sequela of treated disease, inflammatory processes such as sarcoidosis, or a low-grade lymphoproliferative process.   Electronically signed by: Dorethia Molt MD 06/11/2024 01:25 AM EST RP Workstation: [id number]   ANA Comprehensive Panel - [facility] Specimen: Blood Component Ref Range & Units 1 mo ago Comments  Anti-DNA (DS) Ab Qn - [facility] 0 - 9 IU/mL <1                                    Negative      <5                                    Equivocal  5 - 9                                    Positive      >9  RNP Antibodies - [facility] 0.0 - 0.9 AI <0.2   Smith Antibodies - [facility] 0.0 - 0.9 AI <0.2   Antiscleroderma-70 Antibodies - [facility] 0.0 - 0.9 AI <0.2   Sjogren's Anti-SS-A - [facility] 0.0 - 0.9 AI <0.2   Sjogren's Anti-SS-B - [facility] 0.0 - 0.9 AI <0.2   Antichromatin Antibodies - [facility] 0.0 - 0.9 AI <0.2   Anti-Jo-1 - [facility] 0.0 - 0.9 AI <0.2   Anti-Centromere B Antibodies - [facility] 0.0 - 0.9 AI <0.2   See below: - [facility] Comment Autoantibody  Disease Association ------------------------------------------------------------                         Condition                  Frequency ---------------------   ------------------------   --------- Antinuclear  Antibody,    SLE, mixed connective Direct (ANA-D)           tissue diseases ---------------------   ------------------------   --------- dsDNA                    SLE                        40 - 60% ---------------------   ------------------------   --------- Chromatin                Drug induced SLE                90%                          SLE                        48 - 97% ---------------------   ------------------------   --------- SSA (Ro)                 SLE                        25 - 35%                          Sjogren's Syndrome         40 - 70%                          Neonatal Lupus                 100% ---------------------   ------------------------   --------- SSB (La)                 SLE                             10%                          Sjogren's Syndrome              30% ---------------------   -----------------------    --------- Sm (anti-Smith)          SLE                        15 - 30% ---------------------   -----------------------    --------- RNP                      Mixed Connective Tissue                          Disease                         95% (U1 nRNP,  SLE                        30 - 50% anti-ribonucleoprotein)  Polymyositis and/or                          Dermatomyositis                 20% ---------------------   ------------------------   --------- Scl-70 (antiDNA          Scleroderma (diffuse)      20 - 35% topoisomerase)           Crest                           13% ---------------------   ------------------------   --------- Jo-1                     Polymyositis and/or                          Dermatomyositis            20 - 40% ---------------------   ------------------------   --------- Centromere B             Scleroderma - Crest                          variant                         80%     ASSESSMENT/PLAN   Acute hypoxemic respiratory failure - Patient recently seen in pulmonology clinic-her most recent CT  chest was suggestive of underlying interstitial lung disease with reticular nodular peripheral opacification and septal thickening as well as mild honeycombing.  Current CT chest shows similar findings to prior with suggestion of possible underlying sarcoidosis or a low-grade lymphoproliferative process. - I will place additional workup including respiratory viral panel, CRP and ESR, autoimmune workup was negative which was performed with [facility] last month and including rheumatoid factor negative, ANA comprehensive panel negative, Myo marker panel negative, hypersensitivity pneumonitis negative, mold profile negative, allergy RAST panel negative. - elevated ProBNP - s/p diuresis with lasix  which helped and we can continue this. Currently net negative 1300cc -CRP, ESR elevated   RVP is negative - patient currently on 4L/min     Interstitial lung disease     - continue solumedrol 1g daily x 3 days then taper     - will start immunomodulator medication after this   Pulmonary hypertension  NYHA 2-3 Group 3 due to ILD ?  -cardiology on case - will request RHC to be done to check           Thank you for allowing me to participate in the care of this patient.   Patient/Family are satisfied with care plan and all questions have been answered.    Provider disclosure: Patient with at least one acute or chronic illness or injury that poses a threat to life or bodily function and is being managed actively during this encounter.  All of the below services have been performed independently by signing provider:  review of prior documentation from internal and or external health records.  Review of previous and current lab results.  Interview and comprehensive assessment during patient visit today.  Review of current and previous chest radiographs/CT scans. Discussion of management and test interpretation with health care team and patient/family.   This document was prepared using Dragon  voice recognition software and may include unintentional dictation errors.     Lamonte Hartt, M.D.  Division of Pulmonary & Critical Care Medicine

## 2024-06-13 NOTE — Progress Notes (Signed)
 Patient ID: Michelle Hawkins, female   DOB: Oct 22, 1945, 78 y.o.   MRN: [id number] [name]    SUBJECTIVE: Patient states he feels somewhat better less shortness of breath chest pain improved no blackouts no syncope.  Bradycardic denies palpitations or tachycardia.  Patient was interested in cardiac cath prior to discharge home   Vitals:   06/13/24 0401 06/13/24 0500 06/13/24 0735 06/13/24 1206  BP: 120/68  133/67 106/64  Pulse: (!) 54  (!) 58 (!) 56  Resp: 20  16 18   Temp: 98.2 F (36.8 C)  97.9 F (36.6 C) 97.6 F (36.4 C)  TempSrc:      SpO2: 93%  94% 96%  Weight:  58.6 kg    Height:         Intake/Output Summary (Last 24 hours) at 06/13/2024 1308 Last data filed at 06/12/2024 1406 Gross per 24 hour  Intake 240 ml  Output --  Net 240 ml      PHYSICAL EXAM  General: Well developed, well nourished, in no acute distress HEENT:  Normocephalic and atramatic Neck:  No JVD.  Lungs: Clear bilaterally to auscultation and percussion. Heart: HRRR . Normal S1 and S2 without gallops or murmurs.  Abdomen: Bowel sounds are positive, abdomen soft and non-tender  Msk:  Back normal, normal gait. Normal strength and tone for age. Extremities: No clubbing, cyanosis or edema.   Neuro: Alert and oriented X 3. Psych:  Good affect, responds appropriately   LABS: Basic Metabolic Panel: Recent Labs    06/12/24 0409 06/13/24 0352  NA 131* 135  K 4.1 3.2*  CL 97* 97*  CO2 24 24  GLUCOSE 267* 122*  BUN 20 23  CREATININE 0.92 0.96  CALCIUM  9.7 9.3  MG 1.8  --    Liver Function Tests: Recent Labs    06/11/24 0012  AST 34  ALT 29  ALKPHOS 95  BILITOT 0.5  PROT 7.4  ALBUMIN  4.2   No results for input(s): LIPASE, AMYLASE in the last 72 hours. CBC: Recent Labs    06/11/24 0600 06/12/24 0409  WBC 15.1* 8.1  NEUTROABS 11.7*  --   HGB 12.0 13.0  HCT 37.8 39.1  MCV 89.4 87.1  PLT 123* PLATELET CLUMPS NOTED ON SMEAR, UNABLE TO ESTIMATE   Cardiac Enzymes: No results  for input(s): CKTOTAL, CKMB, CKMBINDEX, TROPONINI in the last 72 hours. BNP: Invalid input(s): POCBNP D-Dimer: No results for input(s): DDIMER in the last 72 hours. Hemoglobin A1C: Recent Labs    06/11/24 0518  HGBA1C 6.8*   Fasting Lipid Panel: No results for input(s): CHOL, HDL, LDLCALC, TRIG, CHOLHDL, LDLDIRECT in the last 72 hours. Thyroid  Function Tests: No results for input(s): TSH, T4TOTAL, T3FREE, THYROIDAB in the last 72 hours.  Invalid input(s): FREET3 Anemia Panel: No results for input(s): VITAMINB12, FOLATE, FERRITIN, TIBC, IRON , RETICCTPCT in the last 72 hours.  No results found.   Echo preserved left ventricular function EF of 60% with diastolic dysfunction  TELEMETRY: Bigeminy sinus low voltage intrinsic:  ASSESSMENT AND PLAN:  Principal Problem:   Chest pain Active Problems:   Diabetes mellitus (HCC)   History of breast cancer s/p chemoradiation   Possible new onset CHF (congestive heart failure) (HCC)   COPD (chronic obstructive pulmonary disease) (HCC)   Thrombocytopenia, new   Mediastinal lymphadenopathy   Ventricular trigeminy    Plan Chest pain possibly cardiac may be consistent with angina would recommend further evaluation patient reportedly had CTA in 10/27/2023 with minimal disease consider alternative  functional study versus cardiac cath continue Imdur  for anginal type symptoms Interstitial lung disease with shortness of breath consider right heart cath for evaluation of possible pulmonary hypertension Hyperlipidemia continue Lipitor therapy for lipid management Diabetes type 2 uncomplicated continue metformin  therapy A1c goal less than 7 Hypertension reasonably controlled blood pressure today has been running slightly low would consider reducing metoprolol  and losartan  GERD continue omeprazole  therapy for reflux type symptoms COPD continue inhalers continue to follow-up with pulmonary agree with  their input History of breast cancer currently stable continue conservative therapy  Michelle JONETTA Lovelace, MD 06/13/2024 1:08 PM

## 2024-06-13 NOTE — Progress Notes (Signed)
 [facility]  - [place] [facility]   PATIENT NAME: Michelle Hawkins    MR#:  [id number]  DATE OF BIRTH:  1946/04/24  SUBJECTIVE:  no family at bedside. Patient overall feeling well gets short of breath with exertion. Wears oxygen  at home. Currently brought down to 2 1/2 L per minute. Sats remain more than 92%. Denies any chest pain. Tolerating PO diet.  Overall feeling ok except weak. Wants to go home. No cp  VITALS:  Blood pressure 106/64, pulse (!) 56, temperature 97.6 F (36.4 C), resp. rate 18, height 5' 4 (1.626 m), weight 58.6 kg, SpO2 96%.  PHYSICAL EXAMINATION:   GENERAL:  78 y.o.-year-old patient with no acute distress.  LUNGS: decreased breath sounds bilaterally, no wheezing CARDIOVASCULAR: S1, S2 normal. No murmur   ABDOMEN: Soft, nontender, nondistended. Bowel sounds present.  EXTREMITIES: No  edema b/l.    NEUROLOGIC: nonfocal  patient is alert and awake   LABORATORY PANEL:  CBC Recent Labs  Lab 06/12/24 0409  WBC 8.1  HGB 13.0  HCT 39.1  PLT PLATELET CLUMPS NOTED ON SMEAR, UNABLE TO ESTIMATE    Chemistries  Recent Labs  Lab 06/11/24 0012 06/11/24 2309 06/12/24 0409 06/13/24 0352  NA 136  --  131* 135  K 4.4  --  4.1 3.2*  CL 99  --  97* 97*  CO2 25  --  24 24  GLUCOSE 117*   < > 267* 122*  BUN 19  --  20 23  CREATININE 1.00  --  0.92 0.96  CALCIUM  9.4  --  9.7 9.3  MG  --   --  1.8  --   AST 34  --   --   --   ALT 29  --   --   --   ALKPHOS 95  --   --   --   BILITOT 0.5  --   --   --    < > = values in this interval not displayed.   Cardiac Enzymes No results for input(s): TROPONINI in the last 168 hours. RADIOLOGY:  No results found.   Assessment and Plan FINOLA ROSAL is a 78 y.o. female with medical history significant for diabetes mellitus type 2, hypertension, COPD, former smoker, history of breast cancer status post chemoradiation, exertional dyspnea being worked up by pulmonary as outpatient with prior CT showing  pulmonary fibrosis mediastinal adenopathy and pulmonary hypertension presented to hospital with substernal chest pain  with shortness of breath.   proBNP elevated at 3081. CBC notable for mild leukocytosis of 12.2 and new thrombocytopenia of 92. Respiratory viral panel was negative for COVID influenza and RSV. EKG showed normal sinus rhythm with borderline T wave abnormalities.   CTA of the chest was negative for PE but showed cardiomegaly and alveolar pulmonary edema with midsternal hilar lymphadenopathy.   New onset CHF (congestive heart failure) acute diastolic in the setting of ILD Patient presenting with new exertional shortness of breath, elevated proBNP, mild pulmonary edema on CTA chest.  Trial of Lasix  IV twice daily..   --Continue metoprolol .  Losartan  and Imdur  on hold due to borderline low blood pressure.   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  CTA of the chest was negative for PE but mediastinal lymphadenopathy.  Patient also had ventricular trigeminy.  Cardiology has been consulted.  Troponin 17 followed by 17 and not elevated.    Thrombocytopenia, new Mild leukocytosis Mild thrombocytopenia.  Peripheral blood smear with normal platelet morphology   Mediastinal lymphadenopathy Pulmonary hypertension Seen by pulmonary as outpatient.   COPD (chronic obstructive pulmonary disease) on chronic oxygen  Continue DuoNebs.   History of breast cancer s/p chemoradiation Stable at this time   Diabetes mellitus  Continue sliding scale insulin , Accu-Cheks, diabetic diet.    Procedures: Family  communication :dter Amy Consults : [facility], pulmonary CODE STATUS: full DVT Prophylaxis : Level of care: Telemetry Status is: Inpatient Remains inpatient appropriate because: CHF  Pt can d/c back home in 1-2 days     TOTAL TIME TAKING CARE OF THIS PATIENT: 35 minutes.  >50% time spent on counselling and coordination of care  Note: This dictation was prepared with Dragon dictation along with smaller phrase technology. Any transcriptional errors that result from this process are unintentional.  Leita Blanch M.D    [facility]   CC: Primary care physician; Sherial Bail, MD

## 2024-06-14 DIAGNOSIS — I5033 Acute on chronic diastolic (congestive) heart failure: Secondary | ICD-10-CM

## 2024-06-14 LAB — GLUCOSE, CAPILLARY
Glucose-Capillary: 244 mg/dL — ABNORMAL HIGH (ref 70–99)
Glucose-Capillary: 328 mg/dL — ABNORMAL HIGH (ref 70–99)
Glucose-Capillary: 385 mg/dL — ABNORMAL HIGH (ref 70–99)
Glucose-Capillary: 516 mg/dL (ref 70–99)
Glucose-Capillary: 243 mg/dL — ABNORMAL HIGH (ref 70–99)

## 2024-06-14 LAB — BASIC METABOLIC PANEL WITH GFR
Anion gap: 15 (ref 5–15)
BUN: 29 mg/dL — ABNORMAL HIGH (ref 8–23)
CO2: 22 mmol/L (ref 22–32)
Calcium: 8.8 mg/dL — ABNORMAL LOW (ref 8.9–10.3)
Chloride: 92 mmol/L — ABNORMAL LOW (ref 98–111)
Creatinine, Ser: 1.03 mg/dL — ABNORMAL HIGH (ref 0.44–1.00)
GFR, Estimated: 55 mL/min — ABNORMAL LOW (ref >60)
Glucose, Bld: 429 mg/dL — ABNORMAL HIGH (ref 70–99)
Potassium: 4.3 mmol/L (ref 3.5–5.1)
Sodium: 128 mmol/L — ABNORMAL LOW (ref 135–145)

## 2024-06-14 MED ORDER — INSULIN ASPART 100 UNIT/ML IJ SOLN
0.0000 [IU] | Freq: Three times a day (TID) | INTRAMUSCULAR | Status: DC
  Administered 2024-06-15: 7 [IU] via SUBCUTANEOUS
  Administered 2024-06-15: 11 [IU] via SUBCUTANEOUS
  Administered 2024-06-16 (×2): 7 [IU] via SUBCUTANEOUS
  Filled 2024-06-14: qty 7
  Filled 2024-06-14: qty 11
  Filled 2024-06-14 (×2): qty 7

## 2024-06-14 MED ORDER — FREE WATER: 500.0000 mL | Freq: Once | Status: DC

## 2024-06-14 MED ORDER — SODIUM CHLORIDE 0.9 % IV BOLUS
500.0000 mL | Freq: Once | INTRAVENOUS | Status: AC
  Administered 2024-06-14: 500 mL via INTRAVENOUS

## 2024-06-14 MED ORDER — ASPIRIN 81 MG PO CHEW
81.0000 mg | CHEWABLE_TABLET | ORAL | Status: AC
  Administered 2024-06-15: 81 mg via ORAL
  Filled 2024-06-14: qty 1

## 2024-06-14 MED ORDER — ALUM & MAG HYDROXIDE-SIMETH 200-200-20 MG/5ML PO SUSP
30.0000 mL | Freq: Four times a day (QID) | ORAL | Status: DC | PRN
  Administered 2024-06-14: 30 mL via ORAL
  Filled 2024-06-14: qty 30

## 2024-06-14 MED ORDER — INSULIN ASPART 100 UNIT/ML IJ SOLN
10.0000 [IU] | Freq: Once | INTRAMUSCULAR | Status: AC
  Administered 2024-06-14: 10 [IU] via INTRAVENOUS
  Filled 2024-06-14: qty 10

## 2024-06-14 MED ORDER — FUROSEMIDE 10 MG/ML IJ SOLN
40.0000 mg | Freq: Two times a day (BID) | INTRAMUSCULAR | Status: DC
  Administered 2024-06-14: 40 mg via INTRAVENOUS
  Filled 2024-06-14: qty 4

## 2024-06-14 MED ORDER — INSULIN ASPART 100 UNIT/ML IJ SOLN
0.0000 [IU] | Freq: Every day | INTRAMUSCULAR | Status: DC
  Administered 2024-06-15: 2 [IU] via SUBCUTANEOUS
  Filled 2024-06-14: qty 2

## 2024-06-14 MED ORDER — MAGNESIUM SULFATE 2 GM/50ML IV SOLN
2.0000 g | Freq: Once | INTRAVENOUS | Status: AC
  Administered 2024-06-14: 2 g via INTRAVENOUS
  Filled 2024-06-14: qty 50

## 2024-06-14 MED ORDER — SODIUM CHLORIDE 0.9 % IV SOLN
500.0000 mg | Freq: Every day | INTRAVENOUS | Status: AC
  Filled 2024-06-14: qty 500

## 2024-06-14 MED ORDER — POTASSIUM CHLORIDE CRYS ER 20 MEQ PO TBCR
40.0000 meq | EXTENDED_RELEASE_TABLET | ORAL | Status: AC
  Administered 2024-06-14 (×2): 40 meq via ORAL
  Filled 2024-06-14 (×2): qty 2

## 2024-06-14 MED ORDER — LOSARTAN POTASSIUM 25 MG PO TABS
25.0000 mg | ORAL_TABLET | Freq: Every day | ORAL | Status: DC
  Administered 2024-06-16: 25 mg via ORAL
  Filled 2024-06-14: qty 1

## 2024-06-14 NOTE — Progress Notes (Signed)
 Patient ID: Michelle Hawkins, female   DOB: 1946/06/02, 78 y.o.   MRN: [id number] [facility]    SUBJECTIVE: Resting comfortably patient states she did not sleep well last night she is receiving IV steroid therapy which may have kept her awake and made her feel uneasy and jittery no worsening chest pain no worsening shortness of breath   Vitals:   06/14/24 0455 06/14/24 0740 06/14/24 0838 06/14/24 1238  BP:  115/73 115/73 111/63  Pulse:  60 60 (!) 58  Resp:  16  18  Temp:  (!) 97.5 F (36.4 C)  97.9 F (36.6 C)  TempSrc:      SpO2:  91%  99%  Weight: 67.4 kg     Height:        No intake or output data in the 24 hours ending 06/14/24 1355    PHYSICAL EXAM  General: Well developed, well nourished, in no acute distress HEENT:  Normocephalic and atramatic Neck:  No JVD.  Lungs: Clear bilaterally to auscultation and percussion. Heart: HRRR . Normal S1 and S2 without gallops or murmurs.  Abdomen: Bowel sounds are positive, abdomen soft and non-tender  Msk:  Back normal, normal gait. Normal strength and tone for age. Extremities: No clubbing, cyanosis or edema.   Neuro: Alert and oriented X 3. Psych:  Good affect, responds appropriately   LABS: Basic Metabolic Panel: Recent Labs    06/12/24 0409 06/13/24 0352 06/13/24 1512  NA 131* 135  --   K 4.1 3.2*  --   CL 97* 97*  --   CO2 24 24  --   GLUCOSE 267* 122* 258*  BUN 20 23  --   CREATININE 0.92 0.96  --   CALCIUM  9.7 9.3  --   MG 1.8  --   --    Liver Function Tests: No results for input(s): AST, ALT, ALKPHOS, BILITOT, PROT, ALBUMIN  in the last 72 hours. No results for input(s): LIPASE, AMYLASE in the last 72 hours. CBC: Recent Labs    06/12/24 0409  WBC 8.1  HGB 13.0  HCT 39.1  MCV 87.1  PLT PLATELET CLUMPS NOTED ON SMEAR, UNABLE TO ESTIMATE   Cardiac Enzymes: No results for input(s): CKTOTAL, CKMB, CKMBINDEX, TROPONINI in the last 72 hours. BNP: Invalid input(s):  POCBNP D-Dimer: No results for input(s): DDIMER in the last 72 hours. Hemoglobin A1C: No results for input(s): HGBA1C in the last 72 hours. Fasting Lipid Panel: No results for input(s): CHOL, HDL, LDLCALC, TRIG, CHOLHDL, LDLDIRECT in the last 72 hours. Thyroid  Function Tests: No results for input(s): TSH, T4TOTAL, T3FREE, THYROIDAB in the last 72 hours.  Invalid input(s): FREET3 Anemia Panel: No results for input(s): VITAMINB12, FOLATE, FERRITIN, TIBC, IRON , RETICCTPCT in the last 72 hours.  No results found.   Echo preserved left ventricular function EF reportedly 60% mild diastolic dysfunction evidence of increased right-sided pressure and volume consistent with possible pulm hypertension  TELEMETRY: Normal sinus rhythm rate of 65 nonspecific ST-T wave changes:  ASSESSMENT AND PLAN:  Principal Problem:   Chest pain Active Problems:   Diabetes mellitus (HCC)   History of breast cancer s/p chemoradiation   Possible new onset CHF (congestive heart failure) (HCC)   COPD (chronic obstructive pulmonary disease) (HCC)   Thrombocytopenia, new   Mediastinal lymphadenopathy   Ventricular trigeminy    Plan Chest pain possibly cardiac would recommend cardiac cath on Monday with Dr. Paraschos for further evaluation will consider left and right heart cath because of possible  pulm hypertension Diabetes type 2 continue current medical therapy COPD interstitial lung disease continue inhalers patient currently receiving steroid therapy supplemental oxygen  as necessary continue diuretics as necessary Significant ventricular ectopy continue beta-blockade therapy consider referral to EP for consideration for possible catheter-based therapy if necessary History of breast cancer status post chemo XRT concerned about cardiotoxicity continue current management   Cara JONETTA Lovelace, MD, 06/14/2024 1:55 PM

## 2024-06-14 NOTE — Progress Notes (Signed)
 PULMONOLOGY         Date: 06/14/2024,   MRN# [id number] Michelle Hawkins 1945/07/18     AdmissionWeight: 59.9 kg                 CurrentWeight: 67.4 kg  Referring provider: Dr Sonjia   CHIEF COMPLAINT:   Progressive dyspnea on exertion with chest pain   HISTORY OF PRESENT ILLNESS   This is a 78 year old female with a history of diabetes essential hypertension COPD was a previous smoker, history of breast cancer status post chemo and radiation therapy who was seen by me previously in pulmonology clinic for an outpatient.  She is presented to the emergency department with chest discomfort and dyspnea.  She had a CT chest in the past most recently May 14, 2024 which showed peripheral basilar predominant reticular and septal thickening with associated traction bronchiectasis minimal honeycombing.  There was no pleural effusion, pneumothorax, suspicious appearing nodules, residual scarring from left lumpectomy and axillary lymph node dissection.  Overall findings were suggestive of possible interstitial lung disease.  She also had a VQ scan performed November 18 which was negative for any acute pulmonary embolism or perfusion defect.  She did have matched decreased perfusion at both lung apices likely due to underlying emphysema.  On this admission she had a repeat CT with PE protocol with similar findings as before including mild cardiomegaly and asymmetric RV enlargement, mild hilar and mediastinal adenopathy which appears stable from June 2025 as well as diffuse ground glass opacification suggestive of alveolar pulmonary edema.  Her proBNP was over 3000 on this admission suggestive of cardiac etiology of interstitial pulmonary edema.  Her CMP is essentially unremarkable besides a mild decrement in GFR at 57.  Viral testing for flu, RSV and COVID-19 are negative.  PCCM consultation for further evaluation management.   06/12/24- patient reports minimal improvement clinically. She has  elevated CRP which seems to be due to ILD exacerbation. She will need 1000mg  solumedrol x 3 days Then taper.  06/13/24-  patient appears improved today.  She had first dose of pulsed 1g solumedrol this am at 10am.  Her sugars are only slightly higher and Im rechecking them again this afternoon.  We plan to walk today for chest physiotherapy.  She feels well enough to shower and do her hair and is smiling during interview. We discussed her pulmonary hypertension and have contacted cardiology who has seen patient and may be able to perform cardiac cath while she is inpatient.    06/14/24- patient feels no better.  She reports headache.  I have reduced steroids to 500mg  and have increased her lasix  dose.  Will see her again in am.  She should have cardiac cath coming up soon and may be ok for dc home after that.   PAST MEDICAL HISTORY   Past Medical History:  Diagnosis Date   Allergic rhinitis    Anxiety    Breast cancer (HCC) 1996   s/p left breast lumpectomy (lymph node dissection - 2/11 positive), chemotherapy   Breast cancer (HCC) 1995   right breast lumpectomy   Complication of anesthesia    slow to wake   Depression    Diabetes mellitus    Dysphagia    Family history of adverse reaction to anesthesia    Mother - slow to wake   GERD (gastroesophageal reflux disease)    H/O ulcer disease    PUD   Hypercholesterolemia    Hypertension  Hypothyroidism    multinodular goiter   Nephrolithiasis    Personal history of chemotherapy    Personal history of radiation therapy    Scoliosis    Skin cancer    SVT (supraventricular tachycardia)      SURGICAL HISTORY   Past Surgical History:  Procedure Laterality Date   ABDOMINAL HYSTERECTOMY     partial   BREAST BIOPSY Left 09/2012   benign   BREAST EXCISIONAL BIOPSY Right    1995 benign   BREAST LUMPECTOMY Left 1996   lumpectomy with chemo and rad tx for breast ca (lymph node dissection - 2/11 positive   BREAST LUMPECTOMY WITH  AXILLARY LYMPH NODE DISSECTION  1996   left   CATARACT EXTRACTION W/PHACO Left 11/29/2020   Procedure: CATARACT EXTRACTION PHACO AND INTRAOCULAR LENS PLACEMENT (IOC) LEFT DIABETIC VIVITY TORIC;  Surgeon: Jaye Fallow, MD;  Location: [facility];  Service: Ophthalmology;  Laterality: Left;  Diabetic - oral meds 6.88 00:40.7   CATARACT EXTRACTION W/PHACO Right 12/13/2020   Procedure: CATARACT EXTRACTION PHACO AND INTRAOCULAR LENS PLACEMENT (IOC) RIGHT DIABETIC VIVITY TORIC 5.44 00:40.3;  Surgeon: Jaye Fallow, MD;  Location: [facility];  Service: Ophthalmology;  Laterality: Right;  Diabetic - oral meds   COLONOSCOPY WITH PROPOFOL  N/A 06/03/2019   Procedure: COLONOSCOPY WITH PROPOFOL ;  Surgeon: Dessa Reyes ORN, MD;  Location: [facility];  Service: Endoscopy;  Laterality: N/A;   ESOPHAGOGASTRODUODENOSCOPY N/A 06/03/2019   Procedure: ESOPHAGOGASTRODUODENOSCOPY (EGD);  Surgeon: Dessa Reyes ORN, MD;  Location: [facility] ENDOSCOPY;  Service: Endoscopy;  Laterality: N/A;   LUMBAR LAMINECTOMY  09/2005   L5-S1     FAMILY HISTORY   Family History  Problem Relation Age of Onset   Alzheimer's disease Mother    Heart disease Father    Hypertension Father    Diabetes Father    Diabetes Sister    Diabetes Sister    Diabetes Sister    Diabetes Sister    Breast cancer Neg Hx      SOCIAL HISTORY   Social History   Tobacco Use   Smoking status: Former    Current packs/day: 0.00    Average packs/day: 0.5 packs/day for 40.0 years (20.0 ttl pk-yrs)    Types: Cigarettes    Start date: 04/14/1964    Quit date: 04/14/2004    Years since quitting: 20.1   Smokeless tobacco: Never  Vaping Use   Vaping status: Never Used  Substance Use Topics   Alcohol use: No    Alcohol/week: 0.0 standard drinks of alcohol   Drug use: No     MEDICATIONS      Current Medication:  Current Facility-Administered Medications:    acetaminophen  (TYLENOL ) tablet 650 mg, 650 mg,  Oral, Q6H PRN, 650 mg at 06/13/24 1052 **OR** acetaminophen  (TYLENOL ) suppository 650 mg, 650 mg, Rectal, Q6H PRN, Cleatus Delayne GAILS, MD   albuterol  (PROVENTIL ) (2.5 MG/3ML) 0.083% nebulizer solution 2.5 mg, 2.5 mg, Nebulization, Q2H PRN, Cleatus Delayne GAILS, MD   atorvastatin  (LIPITOR) tablet 20 mg, 20 mg, Oral, Daily, Cleatus Delayne V, MD, 20 mg at 06/13/24 9093   azithromycin  (ZITHROMAX ) tablet 250 mg, 250 mg, Oral, Daily, Sydnie Sigmund, MD, 250 mg at 06/13/24 0906   enoxaparin  (LOVENOX ) injection 40 mg, 40 mg, Subcutaneous, Q24H, Patel, Sona, MD, 40 mg at 06/13/24 2115   furosemide  (LASIX ) injection 20 mg, 20 mg, Intravenous, BID, Cleatus Delayne V, MD, 20 mg at 06/13/24 1826   HYDROcodone -acetaminophen  (NORCO/VICODIN) 5-325 MG per tablet 1-2 tablet, 1-2 tablet,  Oral, Q4H PRN, Duncan, Hazel V, MD   insulin  aspart (novoLOG ) injection 0-5 Units, 0-5 Units, Subcutaneous, QHS, Duncan, Hazel V, MD, 5 Units at 06/13/24 2115   insulin  aspart (novoLOG ) injection 0-9 Units, 0-9 Units, Subcutaneous, TID WC, Cleatus Delayne GAILS, MD, 5 Units at 06/13/24 1614   isosorbide  mononitrate (IMDUR ) 24 hr tablet 30 mg, 30 mg, Oral, Daily, Duncan, Hazel V, MD, 30 mg at 06/13/24 9093   levothyroxine  (SYNTHROID ) tablet 25 mcg, 25 mcg, Oral, Q0600, Cleatus Delayne GAILS, MD, 25 mcg at 06/13/24 0753   losartan  (COZAAR ) tablet 100 mg, 100 mg, Oral, Daily, Cleatus Delayne V, MD, 100 mg at 06/13/24 0906   methylPREDNISolone  sodium succinate (SOLU-MEDROL ) 1,000 mg in sodium chloride  0.9 % 50 mL IVPB, 1,000 mg, Intravenous, Daily, Angeliki Mates, MD, Last Rate: 66 mL/hr at 06/13/24 0915, 1,000 mg at 06/13/24 0915   metoprolol  succinate (TOPROL -XL) 24 hr tablet 12.5 mg, 12.5 mg, Oral, Daily, Hudson, Caralyn, PA-C, 12.5 mg at 06/13/24 9093   ondansetron  (ZOFRAN ) tablet 4 mg, 4 mg, Oral, Q6H PRN **OR** ondansetron  (ZOFRAN ) injection 4 mg, 4 mg, Intravenous, Q6H PRN, Cleatus Delayne GAILS, MD    ALLERGIES   Patient has no known  allergies.     REVIEW OF SYSTEMS    Review of Systems:  Gen:  Denies  fever, sweats, chills weigh loss  HEENT: Denies blurred vision, double vision, ear pain, eye pain, hearing loss, nose bleeds, sore throat Cardiac:  No dizziness, chest pain or heaviness, chest tightness,edema Resp:   reports dyspnea chronically  Gi: Denies swallowing difficulty, stomach pain, nausea or vomiting, diarrhea, constipation, bowel incontinence Gu:  Denies bladder incontinence, burning urine Ext:   Denies Joint pain, stiffness or swelling Skin: Denies  skin rash, easy bruising or bleeding or hives Endoc:  Denies polyuria, polydipsia , polyphagia or weight change Psych:   Denies depression, insomnia or hallucinations   Other:  All other systems negative   VS: BP 115/73 (BP Location: Left Arm)   Pulse 60   Temp (!) 97.5 F (36.4 C)   Resp 16   Ht 5' 4 (1.626 m)   Wt 67.4 kg   SpO2 91%   BMI 25.51 kg/m      PHYSICAL EXAM    GENERAL:NAD, no fevers, chills, no weakness no fatigue HEAD: Normocephalic, atraumatic.  EYES: Pupils equal, round, reactive to light. Extraocular muscles intact. No scleral icterus.  MOUTH: Moist mucosal membrane. Dentition intact. No abscess noted.  EAR, NOSE, THROAT: Clear without exudates. No external lesions.  NECK: Supple. No thyromegaly. No nodules. No JVD.  PULMONARY: decreased breath sounds with mild rhonchi worse at bases bilaterally.  CARDIOVASCULAR: S1 and S2. Regular rate and rhythm. No murmurs, rubs, or gallops. No edema. Pedal pulses 2+ bilaterally.  GASTROINTESTINAL: Soft, nontender, nondistended. No masses. Positive bowel sounds. No hepatosplenomegaly.  MUSCULOSKELETAL: No swelling, clubbing, or edema. Range of motion full in all extremities.  NEUROLOGIC: Cranial nerves II through XII are intact. No gross focal neurological deficits. Sensation intact. Reflexes intact.  SKIN: No ulceration, lesions, rashes, or cyanosis. Skin warm and dry. Turgor intact.   PSYCHIATRIC: Mood, affect within normal limits. The patient is awake, alert and oriented x 3. Insight, judgment intact.       IMAGING   Narrative & Impression  EXAM: CTA of the Chest with contrast for PE 06/11/2024 01:13:52 AM   TECHNIQUE: CTA of the chest was performed after the administration of 75 mL of iohexol  (OMNIPAQUE ) 350 MG/ML injection. Multiplanar reformatted  images are provided for review. MIP images are provided for review. Automated exposure control, iterative reconstruction, and/or weight based adjustment of the mA/kV was utilized to reduce the radiation dose to as low as reasonably achievable.   COMPARISON: Prior examination 12/2023 and more remote prior examination of 10/26/2023.   CLINICAL HISTORY: Pulmonary embolism (PE) suspected, high prob. Breast cancer. *tracking code: Bo*   FINDINGS:   PULMONARY ARTERIES: Pulmonary arteries are adequately opacified for evaluation. No pulmonary embolism. The central pulmonary arteries are enlarged in keeping with changes of pulmonary arterial hypertension. Main pulmonary artery is normal in caliber.   MEDIASTINUM: Mild coronary artery calcification. Mild cardiomegaly with asymmetric enlargement of the right heart chambers in keeping with elevated right heart pressure. No pericardial effusion. Mild atherosclerotic calcifications within the thoracic aorta. No aortic aneurysm.   LYMPH NODES: Pathologic mediastinal and bilateral hilar adenopathy is present with the index lymph node measuring 17 mm in short axis diameter within the subcarinal lymph node group and 18 mm in diameter within the right hilar lymph node groove (54/4). These appear stable since the prior examination 12/2023 and more remote prior examination of 10/26/2023 possible reflecting the sequela of treated disease, and inflammatory processes such as sarcoidosis, or a low-grade lymphoproliferative process.   LUNGS AND PLEURA: Mild diffuse  ground-glass pulmonary opacity is again seen demonstrating a basilar predominance, progressive since prior examination and most suggestive of mild alveolar pulmonary edema. No pneumothorax or pleural effusion.   UPPER ABDOMEN: Limited images of the upper abdomen are unremarkable.   SOFT TISSUES AND BONES: No acute bone or soft tissue abnormality.   IMPRESSION: 1. No pulmonary embolism. 2. Cardiomegaly with asymmetric enlargement of the right heart chambers been with elevated right heart pressure. 3. Morphologic changes in keeping with pulmonary arterial hypertension. 4. Mild diffuse ground-glass pulmonary opacity with basilar predominance, progressive since prior examination, most suggestive of mild alveolar pulmonary edema. 5. Pathologic mediastinal and bilateral hilar adenopathy, stable since prior examination, possibly reflecting sequela of treated disease, inflammatory processes such as sarcoidosis, or a low-grade lymphoproliferative process.   Electronically signed by: Dorethia Molt MD 06/11/2024 01:25 AM EST RP Workstation: [id number]   ANA Comprehensive Panel - [facility] Specimen: Blood Component Ref Range & Units 1 mo ago Comments  Anti-DNA (DS) Ab Qn - [facility] 0 - 9 IU/mL <1                                    Negative      <5                                    Equivocal  5 - 9                                    Positive      >9  RNP Antibodies - [facility] 0.0 - 0.9 AI <0.2   Smith Antibodies - [facility] 0.0 - 0.9 AI <0.2   Antiscleroderma-70 Antibodies - [facility] 0.0 - 0.9 AI <0.2   Sjogren's Anti-SS-A - [facility] 0.0 - 0.9 AI <0.2   Sjogren's Anti-SS-B - [facility] 0.0 - 0.9 AI <0.2   Antichromatin Antibodies - [facility] 0.0 - 0.9 AI <0.2   Anti-Jo-1 - [facility] 0.0 - 0.9 AI <0.2  Anti-Centromere B Antibodies - [facility] 0.0 - 0.9 AI <0.2   See below: - [facility] Comment Autoantibody                       Disease  Association ------------------------------------------------------------                         Condition                  Frequency ---------------------   ------------------------   --------- Antinuclear Antibody,    SLE, mixed connective Direct (ANA-D)           tissue diseases ---------------------   ------------------------   --------- dsDNA                    SLE                        40 - 60% ---------------------   ------------------------   --------- Chromatin                Drug induced SLE                90%                          SLE                        48 - 97% ---------------------   ------------------------   --------- SSA (Ro)                 SLE                        25 - 35%                          Sjogren's Syndrome         40 - 70%                          Neonatal Lupus                 100% ---------------------   ------------------------   --------- SSB (La)                 SLE                             10%                          Sjogren's Syndrome              30% ---------------------   -----------------------    --------- Sm (anti-Smith)          SLE                        15 - 30% ---------------------   -----------------------    --------- RNP                      Mixed Connective Tissue                          Disease  95% (U1 nRNP,                SLE                        30 - 50% anti-ribonucleoprotein)  Polymyositis and/or                          Dermatomyositis                 20% ---------------------   ------------------------   --------- Scl-70 (antiDNA          Scleroderma (diffuse)      20 - 35% topoisomerase)           Crest                           13% ---------------------   ------------------------   --------- Jo-1                     Polymyositis and/or                          Dermatomyositis            20 - 40% ---------------------   ------------------------   --------- Centromere B              Scleroderma - Crest                          variant                         80%     ASSESSMENT/PLAN   Acute hypoxemic respiratory failure - Patient recently seen in pulmonology clinic-her most recent CT chest was suggestive of underlying interstitial lung disease with reticular nodular peripheral opacification and septal thickening as well as mild honeycombing.  Current CT chest shows similar findings to prior with suggestion of possible underlying sarcoidosis or a low-grade lymphoproliferative process. -  autoimmune workup was negative which was performed with [facility] last month and including rheumatoid factor negative, ANA comprehensive panel negative, Myo marker panel negative, hypersensitivity pneumonitis negative, mold profile negative, allergy RAST panel negative. - elevated ProBNP - s/p diuresis with lasix  which helped and we can continue this. Currently net negative 1300cc -CRP elevated   RVP not done - patient currently on 4L/min     Interstitial lung disease     - continue pulse dose solumedrol daily x 3 days then taper     - will start immunomodulator medication after this   Pulmonary hypertension  NYHA 2-3 Group 3 due to ILD ?  -cardiology on case - will request RHC to be done to check         Thank you for allowing me to participate in the care of this patient.   Patient/Family are satisfied with care plan and all questions have been answered.    Provider disclosure: Patient with at least one acute or chronic illness or injury that poses a threat to life or bodily function and is being managed actively during this encounter.  All of the below services have been performed independently by signing provider:  review of prior documentation from internal and or external health records.  Review of previous and current lab results.  Interview and comprehensive  assessment during patient visit today. Review of current and previous chest radiographs/CT scans.  Discussion of management and test interpretation with health care team and patient/family.   This document was prepared using Dragon voice recognition software and may include unintentional dictation errors.     Johnrobert Foti, M.D.  Division of Pulmonary & Critical Care Medicine

## 2024-06-14 NOTE — Care Management Important Message (Signed)
 Important Message  Patient Details  Name: Michelle Hawkins MRN: [id number] Date of Birth: 1945-09-20   Important Message Given:  Yes - Medicare IM     Drago Hammonds W, CMA 06/14/2024, 8:20 PM

## 2024-06-14 NOTE — Progress Notes (Signed)
 PROGRESS NOTE    Michelle Hawkins  FMW:[id number] DOB: 1946-04-29 DOA: 06/10/2024 PCP: Sherial Bail, MD    Assessment & Plan:   Principal Problem:   Chest pain Active Problems:   Possible new onset CHF (congestive heart failure) (HCC)   Thrombocytopenia, new   Mediastinal lymphadenopathy   Diabetes mellitus (HCC)   History of breast cancer s/p chemoradiation   COPD (chronic obstructive pulmonary disease) (HCC)   Ventricular trigeminy  Assessment and Plan: Acute diastolic CHF exacerbation: continue on IV lasix . Monitor I/Os. Continue on imdur , losartan , metoprolol . Cardiac cath tomorrow as per cardio. Cardio following and recs apprec   ILD: continue w/ supportive care. Continue on IV steroids and bronchodilators as per pulmon. Pulmon following and recs apprec   Chest pain: w/ hx of nonobstructive CAD. Neg stress test 10/2023. CTA chest neg for PE    Thrombocytopenia: etiology unclear. Will continue to monitor    Mediastinal lymphadenopathy: etiology unclear. Will need f/u outpatient w/ pulmon  Pulmonary hypertension: continue w/ supportive care. Will need to f/u outpatient w/ pulmon    COPD: continue on azithromycin , bronchodilators  Hx of breast cancer: s/p chemoradiation. Management per onco outpatient   DM2: well controlled, HbA1c 6.8. Continue on SSI w/ accuchecks      DVT prophylaxis: lovenox  Code Status: full  Family Communication:  Disposition Plan: likely d/c back home  Status is: Inpatient Remains inpatient appropriate because: severity of illness, cardiac cath tomorrow    Level of care: Telemetry Consultants:  Cardio pulmon  Procedures:   Antimicrobials: azithromycin    Subjective: Pt c/o shortness of breath   Objective: Vitals:   06/14/24 0358 06/14/24 0455 06/14/24 0740 06/14/24 0838  BP: 126/74  115/73 115/73  Pulse: 60  60 60  Resp: 16  16   Temp: 98.6 F (37 C)  (!) 97.5 F (36.4 C)   TempSrc:      SpO2: 91%  91%   Weight:   67.4 kg    Height:       No intake or output data in the 24 hours ending 06/14/24 0948 Filed Weights   06/12/24 0500 06/13/24 0500 06/14/24 0455  Weight: 59.8 kg 58.6 kg 67.4 kg    Examination:  General exam: Appears calm and comfortable  Respiratory system: decreased breath sounds b/l  Cardiovascular system: S1 & S2 +. No rubs, gallops or clicks. Gastrointestinal system: Abdomen is nondistended, soft and nontender. . Normal bowel sounds heard. Central nervous system: Alert and oriented. Moves all extremities Psychiatry: Judgement and insight appear normal. Flat mood and affect    Data Reviewed: I have personally reviewed following labs and imaging studies  CBC: Recent Labs  Lab 06/11/24 0012 06/11/24 0600 06/12/24 0409  WBC 12.2* 15.1* 8.1  NEUTROABS  --  11.7*  --   HGB 13.1 12.0 13.0  HCT 40.9 37.8 39.1  MCV 89.5 89.4 87.1  PLT 92* 123* PLATELET CLUMPS NOTED ON SMEAR, UNABLE TO ESTIMATE   Basic Metabolic Panel: Recent Labs  Lab 06/11/24 0012 06/11/24 2309 06/12/24 0409 06/13/24 0352 06/13/24 1512  NA 136  --  131* 135  --   K 4.4  --  4.1 3.2*  --   CL 99  --  97* 97*  --   CO2 25  --  24 24  --   GLUCOSE 117* 406* 267* 122* 258*  BUN 19  --  20 23  --   CREATININE 1.00  --  0.92 0.96  --   CALCIUM   9.4  --  9.7 9.3  --   MG  --   --  1.8  --   --    GFR: Estimated Creatinine Clearance: 44.9 mL/min (by C-G formula based on SCr of 0.96 mg/dL). Liver Function Tests: Recent Labs  Lab 06/11/24 0012  AST 34  ALT 29  ALKPHOS 95  BILITOT 0.5  PROT 7.4  ALBUMIN  4.2   No results for input(s): LIPASE, AMYLASE in the last 168 hours. No results for input(s): AMMONIA in the last 168 hours. Coagulation Profile: No results for input(s): INR, PROTIME in the last 168 hours. Cardiac Enzymes: No results for input(s): CKTOTAL, CKMB, CKMBINDEX, TROPONINI in the last 168 hours. BNP (last 3 results) Recent Labs    06/11/24 0012  PROBNP 3,081.0*    HbA1C: No results for input(s): HGBA1C in the last 72 hours. CBG: Recent Labs  Lab 06/13/24 0741 06/13/24 1208 06/13/24 1603 06/13/24 1953 06/14/24 0743  GLUCAP 125* 160* 292* 367* 244*   Lipid Profile: No results for input(s): CHOL, HDL, LDLCALC, TRIG, CHOLHDL, LDLDIRECT in the last 72 hours. Thyroid  Function Tests: No results for input(s): TSH, T4TOTAL, FREET4, T3FREE, THYROIDAB in the last 72 hours. Anemia Panel: No results for input(s): VITAMINB12, FOLATE, FERRITIN, TIBC, IRON , RETICCTPCT in the last 72 hours. Sepsis Labs: No results for input(s): PROCALCITON, LATICACIDVEN in the last 168 hours.  Recent Results (from the past 240 hours)  Resp panel by RT-PCR (RSV, Flu A&B, Covid) Anterior Nasal Swab     Status: None   Collection Time: 06/11/24 12:05 AM   Specimen: Anterior Nasal Swab  Result Value Ref Range Status   SARS Coronavirus 2 by RT PCR NEGATIVE NEGATIVE Final    Comment: (NOTE) SARS-CoV-2 target nucleic acids are NOT DETECTED.  The SARS-CoV-2 RNA is generally detectable in upper respiratory specimens during the acute phase of infection. The lowest concentration of SARS-CoV-2 viral copies this assay can detect is 138 copies/mL. A negative result does not preclude SARS-Cov-2 infection and should not be used as the sole basis for treatment or other patient management decisions. A negative result may occur with  improper specimen collection/handling, submission of specimen other than nasopharyngeal swab, presence of viral mutation(s) within the areas targeted by this assay, and inadequate number of viral copies(<138 copies/mL). A negative result must be combined with clinical observations, patient history, and epidemiological information. The expected result is Negative.  Fact Sheet for Patients:  bloggercourse.com  Fact Sheet for Healthcare Providers:   seriousbroker.it  This test is no t yet approved or cleared by the [place]  FDA and  has been authorized for detection and/or diagnosis of SARS-CoV-2 by FDA under an Emergency Use Authorization (EUA). This EUA will remain  in effect (meaning this test can be used) for the duration of the COVID-19 declaration under Section 564(b)(1) of the Act, 21 U.S.C.section 360bbb-3(b)(1), unless the authorization is terminated  or revoked sooner.       Influenza A by PCR NEGATIVE NEGATIVE Final   Influenza B by PCR NEGATIVE NEGATIVE Final    Comment: (NOTE) The Xpert Xpress SARS-CoV-2/FLU/RSV plus assay is intended as an aid in the diagnosis of influenza from Nasopharyngeal swab specimens and should not be used as a sole basis for treatment. Nasal washings and aspirates are unacceptable for Xpert Xpress SARS-CoV-2/FLU/RSV testing.  Fact Sheet for Patients: bloggercourse.com  Fact Sheet for Healthcare Providers: seriousbroker.it  This test is not yet approved or cleared by the [place]  FDA and has been authorized  for detection and/or diagnosis of SARS-CoV-2 by FDA under an Emergency Use Authorization (EUA). This EUA will remain in effect (meaning this test can be used) for the duration of the COVID-19 declaration under Section 564(b)(1) of the Act, 21 U.S.C. section 360bbb-3(b)(1), unless the authorization is terminated or revoked.     Resp Syncytial Virus by PCR NEGATIVE NEGATIVE Final    Comment: (NOTE) Fact Sheet for Patients: bloggercourse.com  Fact Sheet for Healthcare Providers: seriousbroker.it  This test is not yet approved or cleared by the [place]  FDA and has been authorized for detection and/or diagnosis of SARS-CoV-2 by FDA under an Emergency Use Authorization (EUA). This EUA will remain in effect (meaning this test can be used) for  the duration of the COVID-19 declaration under Section 564(b)(1) of the Act, 21 U.S.C. section 360bbb-3(b)(1), unless the authorization is terminated or revoked.  Performed at [facility], [address] [place], [place] [address]          Radiology Studies: No results found.      Scheduled Meds:  atorvastatin   20 mg Oral Daily   azithromycin   250 mg Oral Daily   enoxaparin  (LOVENOX ) injection  40 mg Subcutaneous Q24H   furosemide   20 mg Intravenous BID   insulin  aspart  0-5 Units Subcutaneous QHS   insulin  aspart  0-9 Units Subcutaneous TID WC   isosorbide  mononitrate  30 mg Oral Daily   levothyroxine   25 mcg Oral Q0600   losartan   100 mg Oral Daily   metoprolol  succinate  12.5 mg Oral Daily   Continuous Infusions:  methylPREDNISolone  (SOLU-MEDROL ) injection 1,000 mg (06/14/24 0859)     LOS: 3 days      Anthony CHRISTELLA Pouch, MD [facility] Pager [phone]  If 7PM-7AM, please contact night-coverage [email or web address] 06/14/2024, 9:48 AM

## 2024-06-14 NOTE — Progress Notes (Addendum)
       CROSS COVER NOTE  NAME: Michelle Hawkins MRN: [id number] DOB : 1945/08/30    Concern as stated by nurse / staff   this patient HR got elevated from 100'-120's no complain of CP, it looks afib on the monitor. I did EKG you can check it on the result. Michelle Hawkins 78yo female Full code. admitted co'z of CP and new onset of CHF pro bnp 3081 troponin 17. pulmo was consulted because of ILD she received solumedrol 1000mg  iv this AM. cardio is planning to do heart on Monday. HR is now back on 60's- 80's      Pertinent findings on chart review: H&P,progress notes and consultants notes briefly reviewed  Patient Assessment    06/14/2024   12:05 AM 06/13/2024    7:51 PM 06/13/2024    4:01 PM  Vitals with BMI  Systolic 115 129 899  Diastolic 66 62 63  Pulse 59 68 60     Assessment and  Interventions   Assessment:  Possible new onset a fib, in the setting of ILD exacerbation Longstanding history of palpitations (negative holter 2023)  Plan: Rate back in the 50s Cardiology already on board- will defer to them for decision on need for systemic anticoagulation Holter monitor from 5/9-5/06/2022,: predominant sinus bradycardia with a mean heart rate of 57 bpm, sinus heart rate ranging from 50-80 bpm. There were occasional premature atrial contractions and rare premature ventricular contractions present. There were 7 atrial runs, the longest lasting 9 beats

## 2024-06-15 ENCOUNTER — Inpatient Hospital Stay: Attending: Oncology

## 2024-06-15 ENCOUNTER — Encounter: Payer: Self-pay | Admitting: Cardiology

## 2024-06-15 ENCOUNTER — Telehealth: Payer: Self-pay | Admitting: Oncology

## 2024-06-15 ENCOUNTER — Encounter: Admission: EM | Disposition: A | Payer: Self-pay | Source: Home / Self Care | Attending: Internal Medicine

## 2024-06-15 HISTORY — PX: RIGHT/LEFT HEART CATH AND CORONARY ANGIOGRAPHY: CATH118266

## 2024-06-15 LAB — POCT I-STAT 7, (LYTES, BLD GAS, ICA,H+H)
Acid-Base Excess: 0 mmol/L (ref 0.0–2.0)
Bicarbonate: 24.7 mmol/L (ref 20.0–28.0)
Calcium, Ion: 1.12 mmol/L — ABNORMAL LOW (ref 1.15–1.40)
HCT: 41 % (ref 36.0–46.0)
Hemoglobin: 13.9 g/dL (ref 12.0–15.0)
O2 Saturation: 93 %
Potassium: 4.3 mmol/L (ref 3.5–5.1)
Sodium: 137 mmol/L (ref 135–145)
TCO2: 26 mmol/L (ref 22–32)
pCO2 arterial: 39.1 mmHg (ref 32–48)
pH, Arterial: 7.408 (ref 7.35–7.45)
pO2, Arterial: 66 mmHg — ABNORMAL LOW (ref 83–108)

## 2024-06-15 LAB — BASIC METABOLIC PANEL WITH GFR
Anion gap: 12 (ref 5–15)
BUN: 29 mg/dL — ABNORMAL HIGH (ref 8–23)
CO2: 24 mmol/L (ref 22–32)
Calcium: 8.8 mg/dL — ABNORMAL LOW (ref 8.9–10.3)
Chloride: 96 mmol/L — ABNORMAL LOW (ref 98–111)
Creatinine, Ser: 0.96 mg/dL (ref 0.44–1.00)
GFR, Estimated: 60 mL/min — ABNORMAL LOW (ref >60)
Glucose, Bld: 278 mg/dL — ABNORMAL HIGH (ref 70–99)
Potassium: 4.6 mmol/L (ref 3.5–5.1)
Sodium: 133 mmol/L — ABNORMAL LOW (ref 135–145)

## 2024-06-15 LAB — POCT I-STAT EG7
Acid-Base Excess: 2 mmol/L (ref 0.0–2.0)
Bicarbonate: 27.3 mmol/L (ref 20.0–28.0)
Calcium, Ion: 1.19 mmol/L (ref 1.15–1.40)
HCT: 43 % (ref 36.0–46.0)
Hemoglobin: 14.6 g/dL (ref 12.0–15.0)
O2 Saturation: 59 %
Potassium: 4.5 mmol/L (ref 3.5–5.1)
Sodium: 135 mmol/L (ref 135–145)
TCO2: 29 mmol/L (ref 22–32)
pCO2, Ven: 42.8 mmHg — ABNORMAL LOW (ref 44–60)
pH, Ven: 7.412 (ref 7.25–7.43)
pO2, Ven: 30 mmHg — CL (ref 32–45)

## 2024-06-15 LAB — CBC
HCT: 41.6 % (ref 36.0–46.0)
Hemoglobin: 13.8 g/dL (ref 12.0–15.0)
MCH: 28.6 pg (ref 26.0–34.0)
MCHC: 33.2 g/dL (ref 30.0–36.0)
MCV: 86.1 fL (ref 80.0–100.0)
Platelets: 173 K/uL (ref 150–400)
RBC: 4.83 MIL/uL (ref 3.87–5.11)
RDW: 15 % (ref 11.5–15.5)
WBC: 14.1 K/uL — ABNORMAL HIGH (ref 4.0–10.5)
nRBC: 0 % (ref 0.0–0.2)

## 2024-06-15 LAB — GLUCOSE, CAPILLARY
Glucose-Capillary: 245 mg/dL — ABNORMAL HIGH (ref 70–99)
Glucose-Capillary: 272 mg/dL — ABNORMAL HIGH (ref 70–99)
Glucose-Capillary: 220 mg/dL — ABNORMAL HIGH (ref 70–99)
Glucose-Capillary: 285 mg/dL — ABNORMAL HIGH (ref 70–99)
Glucose-Capillary: 218 mg/dL — ABNORMAL HIGH (ref 70–99)

## 2024-06-15 SURGERY — RIGHT/LEFT HEART CATH AND CORONARY ANGIOGRAPHY
Anesthesia: Moderate Sedation

## 2024-06-15 MED ORDER — HEPARIN (PORCINE) IN NACL 1000-0.9 UT/500ML-% IV SOLN
INTRAVENOUS | Status: AC
  Filled 2024-06-15: qty 1000

## 2024-06-15 MED ORDER — LIDOCAINE HCL (PF) 1 % IJ SOLN
INTRAMUSCULAR | Status: DC | PRN
  Administered 2024-06-15: 5 mL
  Administered 2024-06-15: 10 mL
  Administered 2024-06-15: 15 mL
  Administered 2024-06-15: 5 mL

## 2024-06-15 MED ORDER — ACETAMINOPHEN 325 MG PO TABS: 650.0000 mg | ORAL_TABLET | ORAL | Status: DC | PRN

## 2024-06-15 MED ORDER — FREE WATER: 500.0000 mL | Freq: Once | Status: DC

## 2024-06-15 MED ORDER — FUROSEMIDE 40 MG PO TABS
40.0000 mg | ORAL_TABLET | Freq: Every day | ORAL | Status: DC
  Administered 2024-06-15 – 2024-06-16 (×2): 40 mg via ORAL
  Filled 2024-06-15 (×2): qty 1

## 2024-06-15 MED ORDER — SODIUM CHLORIDE 0.9 % IV SOLN: INTRAVENOUS | Status: AC | PRN

## 2024-06-15 MED ORDER — SODIUM CHLORIDE 0.9% FLUSH: 3.0000 mL | INTRAVENOUS | Status: DC | PRN

## 2024-06-15 MED ORDER — LIDOCAINE HCL 1 % IJ SOLN
INTRAMUSCULAR | Status: AC
  Filled 2024-06-15: qty 20

## 2024-06-15 MED ORDER — INSULIN ASPART 100 UNIT/ML IJ SOLN
3.0000 [IU] | Freq: Three times a day (TID) | INTRAMUSCULAR | Status: DC
  Administered 2024-06-15 – 2024-06-16 (×4): 3 [IU] via SUBCUTANEOUS
  Filled 2024-06-15 (×4): qty 3

## 2024-06-15 MED ORDER — IOHEXOL 300 MG/ML  SOLN
INTRAMUSCULAR | Status: DC | PRN
  Administered 2024-06-15: 80 mL

## 2024-06-15 MED ORDER — VERAPAMIL HCL 2.5 MG/ML IV SOLN
INTRAVENOUS | Status: AC
  Filled 2024-06-15: qty 2

## 2024-06-15 MED ORDER — HEPARIN SODIUM (PORCINE) 1000 UNIT/ML IJ SOLN
INTRAMUSCULAR | Status: AC
  Filled 2024-06-15: qty 10

## 2024-06-15 MED ORDER — HYDRALAZINE HCL 20 MG/ML IJ SOLN: 10.0000 mg | INTRAMUSCULAR | Status: AC | PRN

## 2024-06-15 MED ORDER — MIDAZOLAM HCL (PF) 2 MG/2ML IJ SOLN
INTRAMUSCULAR | Status: DC | PRN
  Administered 2024-06-15 (×2): .5 mg via INTRAVENOUS

## 2024-06-15 MED ORDER — FENTANYL CITRATE (PF) 100 MCG/2ML IJ SOLN
INTRAMUSCULAR | Status: AC
  Filled 2024-06-15: qty 2

## 2024-06-15 MED ORDER — MIDAZOLAM HCL 2 MG/2ML IJ SOLN
INTRAMUSCULAR | Status: AC
  Filled 2024-06-15: qty 2

## 2024-06-15 MED ORDER — HEPARIN (PORCINE) IN NACL 1000-0.9 UT/500ML-% IV SOLN
INTRAVENOUS | Status: AC
  Filled 2024-06-15: qty 500

## 2024-06-15 MED ORDER — SODIUM CHLORIDE 0.9% FLUSH
3.0000 mL | Freq: Two times a day (BID) | INTRAVENOUS | Status: DC
  Administered 2024-06-15 – 2024-06-16 (×2): 3 mL via INTRAVENOUS

## 2024-06-15 MED ORDER — SODIUM CHLORIDE 0.9 % IV SOLN: 250.0000 mL | INTRAVENOUS | Status: AC | PRN

## 2024-06-15 MED ORDER — HEPARIN (PORCINE) IN NACL 1000-0.9 UT/500ML-% IV SOLN
INTRAVENOUS | Status: DC | PRN
  Administered 2024-06-15: 1000 mL
  Administered 2024-06-15: 500 mL

## 2024-06-15 MED ORDER — FENTANYL CITRATE (PF) 100 MCG/2ML IJ SOLN
INTRAMUSCULAR | Status: DC | PRN
  Administered 2024-06-15 (×2): 12.5 ug via INTRAVENOUS

## 2024-06-15 MED ORDER — INSULIN GLARGINE 100 UNIT/ML ~~LOC~~ SOLN
10.0000 [IU] | Freq: Every day | SUBCUTANEOUS | Status: DC
  Administered 2024-06-15: 10 [IU] via SUBCUTANEOUS
  Filled 2024-06-15 (×2): qty 0.1

## 2024-06-15 MED ORDER — LABETALOL HCL 5 MG/ML IV SOLN: 10.0000 mg | INTRAVENOUS | Status: AC | PRN

## 2024-06-15 SURGICAL SUPPLY — 23 items
CATH BALLN WEDGE 5F 110CM (CATHETERS) IMPLANT
CATH INFINITI 5FR ANG PIGTAIL (CATHETERS) IMPLANT
CATH INFINITI 5FR JL4 (CATHETERS) IMPLANT
CATH INFINITI JR4 5F (CATHETERS) IMPLANT
DEVICE CLOSURE MYNXGRIP 5F (Vascular Products) IMPLANT
DEVICE RAD TR BAND REGULAR (VASCULAR PRODUCTS) IMPLANT
DRAPE BRACHIAL (DRAPES) IMPLANT
DRAPE TABLE BACK 80X90 (DRAPES) IMPLANT
GLIDESHEATH SLEND SS 6F .021 (SHEATH) IMPLANT
GUIDEWIRE .025 260CM (WIRE) IMPLANT
GUIDEWIRE INQWIRE 1.5J.035X260 (WIRE) IMPLANT
KIT ENCORE 26 ADVANTAGE (KITS) IMPLANT
KIT SYRINGE INJ CVI SPIKEX1 (MISCELLANEOUS) IMPLANT
NDL PERC 18GX7CM (NEEDLE) IMPLANT
NEEDLE PERC 18GX7CM (NEEDLE) IMPLANT
PACK CARDIAC CATH (CUSTOM PROCEDURE TRAY) ×1 IMPLANT
SET ATX-X65L (MISCELLANEOUS) IMPLANT
SHEATH AVANTI 5FR X 11CM (SHEATH) IMPLANT
SHEATH GLIDE SLENDER 4/5FR (SHEATH) IMPLANT
STATION PROTECTION PRESSURIZED (MISCELLANEOUS) IMPLANT
TUBING CIL FLEX 10 FLL-RA (TUBING) IMPLANT
WIRE GUIDERIGHT .035X150 (WIRE) IMPLANT
WIRE HITORQ VERSACORE ST 145CM (WIRE) IMPLANT

## 2024-06-15 NOTE — Telephone Encounter (Signed)
 Pt daughter called to let us  know that pt is currently in hospital - might d/c tomorrow. Pt daughter wanted to see if we could look at the bloodwork that has been taken and see if pt still needs infusion. Would possibly like to change appt for Thursday to mychart visit - will follow up w/pt daughter after I hear back from team - [facility]

## 2024-06-15 NOTE — Plan of Care (Signed)
   Problem: Education: Goal: Ability to describe self-care measures that may prevent or decrease complications (Diabetes Survival Skills Education) will improve Outcome: Progressing Goal: Individualized Educational Video(s) Outcome: Progressing   Problem: Coping: Goal: Ability to adjust to condition or change in health will improve Outcome: Progressing

## 2024-06-15 NOTE — Inpatient Diabetes Management (Signed)
 Inpatient Diabetes Program Recommendations  AACE/ADA: New Consensus Statement on Inpatient Glycemic Control  Target Ranges:  Prepandial:   less than 140 mg/dL      Peak postprandial:   less than 180 mg/dL (1-2 hours)      Critically ill patients:  140 - 180 mg/dL    Latest Reference Range & Units 06/14/24 07:43 06/14/24 12:34 06/14/24 16:34 06/14/24 19:54 06/14/24 22:11 06/15/24 03:32 06/15/24 07:26  Glucose-Capillary 70 - 99 mg/dL 755 (H) 671 (H) 614 (H) 516 (HH) 243 (H) 245 (H) 272 (H)   Review of Glycemic Control  Diabetes history: DM2 Outpatient Diabetes medications: Metformin  500 mg QAM, Metformin  1000 mg QPM Current orders for Inpatient glycemic control: Novolog  0-20 units TID with meals, Novolog  0-5 units at bedtime; Solumedrol 500 mg daily  Inpatient Diabetes Program Recommendations:    Insulin : Noted Solumedrol decreased from 1000 mg to 500 mg daily.  If steroids are continued, please consider ordering Semglee  10 units Q24H. Once diet is resumed, consider ordering Novolog  3 units TID with meals for meal coverage if patient eats at least 50% of meals.  Thanks, Earnie Gainer, RN, MSN, CDCES Diabetes Coordinator Inpatient Diabetes Program [phone] (Team Pager from 8am to 5pm)

## 2024-06-15 NOTE — Progress Notes (Signed)
 PROGRESS NOTE    Michelle Hawkins  FMW:[id number] DOB: July 01, 1946 DOA: 06/10/2024 PCP: Sherial Bail, MD    Assessment & Plan:   Principal Problem:   Chest pain Active Problems:   Possible new onset CHF (congestive heart failure) (HCC)   Thrombocytopenia, new   Mediastinal lymphadenopathy   Diabetes mellitus (HCC)   History of breast cancer s/p chemoradiation   COPD (chronic obstructive pulmonary disease) (HCC)   Ventricular trigeminy  Assessment and Plan: Acute diastolic CHF exacerbation: started on po lasix . Monitor I/Os. Continue on metoprolol , losartan , metoprolol . S/p cardiac cath which showed mild to moderate pulmonary hypertension. No significant obstructive CAD. Cardio following and recs apprec  ILD: continue w/ supportive care. Steroids as per pulmon. Continue on bronchodilators. Pulmon following and recs apprec   Chest pain: w/ hx of nonobstructive CAD. Neg stress test 10/2023. CTA chest neg for PE    Thrombocytopenia: etiology unclear. Will continue to monitor     Mediastinal lymphadenopathy: etiology unclear. Will need f/u outpatient w/ pulmon  Pulmonary hypertension: continue w/ supportive care. Will need to f/u outpatient w/ pulmon    COPD: continue on azithromycin , bronchodilators & encourage incentive spirometry   Hx of breast cancer: s/p chemoradiation. Management per onco outpatient   DM2: well controlled, HbA1c 6.8. Continue on SSI w/ accuchecks      DVT prophylaxis: lovenox  Code Status: full  Family Communication:  Disposition Plan: likely d/c back home  Status is: Inpatient Remains inpatient appropriate because: severity of illness, s/p cardiac cath today     Level of care: Telemetry Consultants:  Cardio pulmon  Procedures:   Antimicrobials: azithromycin    Subjective: Pt c/o malaise  Objective: Vitals:   06/15/24 0903 06/15/24 0908 06/15/24 0927 06/15/24 0930  BP: 131/71 (!) 142/71 (!) 140/69 (!) 140/74  Pulse: (!) 59 (!) 58  (!) 58 (!) 56  Resp: (!) 21 13 14 13   Temp:   (!) 96.8 F (36 C)   TempSrc:   Temporal   SpO2: 92%  97% 96%  Weight:      Height:        Intake/Output Summary (Last 24 hours) at 06/15/2024 0955 Last data filed at 06/14/2024 2058 Gross per 24 hour  Intake 652.28 ml  Output --  Net 652.28 ml   Filed Weights   06/13/24 0500 06/14/24 0455 06/15/24 0500  Weight: 58.6 kg 67.4 kg 69.2 kg    Examination:  General exam: Appears comfortable Respiratory system: diminished breath sounds b/l Cardiovascular system: S1/S2+. Gastrointestinal system: Abd is soft, NT, ND & hypoactive bowel sounds Central nervous system: alert & oriented. Moves all extremities  Psychiatry: Judgement and insight appears at baseline. Flat mood and affect    Data Reviewed: I have personally reviewed following labs and imaging studies  CBC: Recent Labs  Lab 06/11/24 0012 06/11/24 0600 06/12/24 0409 06/15/24 0333 06/15/24 0813 06/15/24 0828  WBC 12.2* 15.1* 8.1 14.1*  --   --   NEUTROABS  --  11.7*  --   --   --   --   HGB 13.1 12.0 13.0 13.8 14.6 13.9  HCT 40.9 37.8 39.1 41.6 43.0 41.0  MCV 89.5 89.4 87.1 86.1  --   --   PLT 92* 123* PLATELET CLUMPS NOTED ON SMEAR, UNABLE TO ESTIMATE 173  --   --    Basic Metabolic Panel: Recent Labs  Lab 06/11/24 0012 06/11/24 2309 06/12/24 0409 06/13/24 0352 06/13/24 1512 06/14/24 2110 06/15/24 0333 06/15/24 0813 06/15/24 0828  NA 136  --  131* 135  --  128* 133* 135 137  K 4.4  --  4.1 3.2*  --  4.3 4.6 4.5 4.3  CL 99  --  97* 97*  --  92* 96*  --   --   CO2 25  --  24 24  --  22 24  --   --   GLUCOSE 117*   < > 267* 122* 258* 429* 278*  --   --   BUN 19  --  20 23  --  29* 29*  --   --   CREATININE 1.00  --  0.92 0.96  --  1.03* 0.96  --   --   CALCIUM  9.4  --  9.7 9.3  --  8.8* 8.8*  --   --   MG  --   --  1.8  --   --   --   --   --   --    < > = values in this interval not displayed.   GFR: Estimated Creatinine Clearance: 45.4 mL/min (by C-G  formula based on SCr of 0.96 mg/dL). Liver Function Tests: Recent Labs  Lab 06/11/24 0012  AST 34  ALT 29  ALKPHOS 95  BILITOT 0.5  PROT 7.4  ALBUMIN  4.2   No results for input(s): LIPASE, AMYLASE in the last 168 hours. No results for input(s): AMMONIA in the last 168 hours. Coagulation Profile: No results for input(s): INR, PROTIME in the last 168 hours. Cardiac Enzymes: No results for input(s): CKTOTAL, CKMB, CKMBINDEX, TROPONINI in the last 168 hours. BNP (last 3 results) Recent Labs    06/11/24 0012  PROBNP 3,081.0*   HbA1C: No results for input(s): HGBA1C in the last 72 hours. CBG: Recent Labs  Lab 06/14/24 1634 06/14/24 1954 06/14/24 2211 06/15/24 0332 06/15/24 0726  GLUCAP 385* 516* 243* 245* 272*   Lipid Profile: No results for input(s): CHOL, HDL, LDLCALC, TRIG, CHOLHDL, LDLDIRECT in the last 72 hours. Thyroid  Function Tests: No results for input(s): TSH, T4TOTAL, FREET4, T3FREE, THYROIDAB in the last 72 hours. Anemia Panel: No results for input(s): VITAMINB12, FOLATE, FERRITIN, TIBC, IRON , RETICCTPCT in the last 72 hours. Sepsis Labs: No results for input(s): PROCALCITON, LATICACIDVEN in the last 168 hours.  Recent Results (from the past 240 hours)  Resp panel by RT-PCR (RSV, Flu A&B, Covid) Anterior Nasal Swab     Status: None   Collection Time: 06/11/24 12:05 AM   Specimen: Anterior Nasal Swab  Result Value Ref Range Status   SARS Coronavirus 2 by RT PCR NEGATIVE NEGATIVE Final    Comment: (NOTE) SARS-CoV-2 target nucleic acids are NOT DETECTED.  The SARS-CoV-2 RNA is generally detectable in upper respiratory specimens during the acute phase of infection. The lowest concentration of SARS-CoV-2 viral copies this assay can detect is 138 copies/mL. A negative result does not preclude SARS-Cov-2 infection and should not be used as the sole basis for treatment or other patient management  decisions. A negative result may occur with  improper specimen collection/handling, submission of specimen other than nasopharyngeal swab, presence of viral mutation(s) within the areas targeted by this assay, and inadequate number of viral copies(<138 copies/mL). A negative result must be combined with clinical observations, patient history, and epidemiological information. The expected result is Negative.  Fact Sheet for Patients:  bloggercourse.com  Fact Sheet for Healthcare Providers:  seriousbroker.it  This test is no t yet approved or cleared by the [place]  FDA and  has been authorized for  detection and/or diagnosis of SARS-CoV-2 by FDA under an Emergency Use Authorization (EUA). This EUA will remain  in effect (meaning this test can be used) for the duration of the COVID-19 declaration under Section 564(b)(1) of the Act, 21 U.S.C.section 360bbb-3(b)(1), unless the authorization is terminated  or revoked sooner.       Influenza A by PCR NEGATIVE NEGATIVE Final   Influenza B by PCR NEGATIVE NEGATIVE Final    Comment: (NOTE) The Xpert Xpress SARS-CoV-2/FLU/RSV plus assay is intended as an aid in the diagnosis of influenza from Nasopharyngeal swab specimens and should not be used as a sole basis for treatment. Nasal washings and aspirates are unacceptable for Xpert Xpress SARS-CoV-2/FLU/RSV testing.  Fact Sheet for Patients: bloggercourse.com  Fact Sheet for Healthcare Providers: seriousbroker.it  This test is not yet approved or cleared by the [place]  FDA and has been authorized for detection and/or diagnosis of SARS-CoV-2 by FDA under an Emergency Use Authorization (EUA). This EUA will remain in effect (meaning this test can be used) for the duration of the COVID-19 declaration under Section 564(b)(1) of the Act, 21 U.S.C. section 360bbb-3(b)(1), unless the  authorization is terminated or revoked.     Resp Syncytial Virus by PCR NEGATIVE NEGATIVE Final    Comment: (NOTE) Fact Sheet for Patients: bloggercourse.com  Fact Sheet for Healthcare Providers: seriousbroker.it  This test is not yet approved or cleared by the [place]  FDA and has been authorized for detection and/or diagnosis of SARS-CoV-2 by FDA under an Emergency Use Authorization (EUA). This EUA will remain in effect (meaning this test can be used) for the duration of the COVID-19 declaration under Section 564(b)(1) of the Act, 21 U.S.C. section 360bbb-3(b)(1), unless the authorization is terminated or revoked.  Performed at [facility], [address] [place], [place] [address]          Radiology Studies: CARDIAC CATHETERIZATION Result Date: 06/15/2024   Prox RCA lesion is 20% stenosed.   The left ventricular systolic function is normal.   LV end diastolic pressure is normal.   The left ventricular ejection fraction is 55-65% by visual estimate.   Hemodynamic findings consistent with moderate pulmonary hypertension.   There is mild (2+) mitral regurgitation. 1.  Mild to moderate pulmonary hypertension, PA 59/23  (36) 2.  Near normal coronary anatomy 3.  Normal left ventricular function,  PW 23/13  (19) 4.  Mild mitral regurgitation Recommendations 1.  Agree with overall current therapy 2.  Diuresis as needed 3.  Continue good medical management 4.  Follow-up with Dr.  Aleskerov 5.  Follow-up with me 1 to 2 weeks 6.  May discharge home later today        Scheduled Meds:  [MAR Hold] atorvastatin   20 mg Oral Daily   [MAR Hold] azithromycin   250 mg Oral Daily   [MAR Hold] enoxaparin  (LOVENOX ) injection  40 mg Subcutaneous Q24H   [MAR Hold] free water   500 mL Oral Once   free water   500 mL Oral Once   furosemide   40 mg Oral Daily   [MAR Hold] insulin  aspart  0-20 Units Subcutaneous TID WC   [MAR Hold]  insulin  aspart  0-5 Units Subcutaneous QHS   insulin  aspart  3 Units Subcutaneous TID WC   insulin  glargine  10 Units Subcutaneous Daily   [MAR Hold] isosorbide  mononitrate  30 mg Oral Daily   [MAR Hold] levothyroxine   25 mcg Oral Q0600   [MAR Hold] losartan   25 mg Oral Daily   [  MAR Hold] metoprolol  succinate  12.5 mg Oral Daily   sodium chloride  flush  3 mL Intravenous Q12H   Continuous Infusions:  sodium chloride  Stopped (06/15/24 0932)   sodium chloride      [MAR Hold] methylPREDNISolone  (SOLU-MEDROL ) injection       LOS: 4 days      Anthony CHRISTELLA Pouch, MD [facility] Pager [phone]  If 7PM-7AM, please contact night-coverage [email or web address] 06/15/2024, 9:55 AM

## 2024-06-15 NOTE — Plan of Care (Signed)

## 2024-06-15 NOTE — Progress Notes (Signed)
 PULMONOLOGY         Date: 06/15/2024,   MRN# [id number] Michelle Hawkins 05-05-1946     AdmissionWeight: 59.9 kg                 CurrentWeight: 69.2 kg  Referring provider: Dr Sonjia   CHIEF COMPLAINT:   Progressive dyspnea on exertion with chest pain   HISTORY OF PRESENT ILLNESS   This is a 78 year old female with a history of diabetes essential hypertension COPD was a previous smoker, history of breast cancer status post chemo and radiation therapy who was seen by me previously in pulmonology clinic for an outpatient.  She is presented to the emergency department with chest discomfort and dyspnea.  She had a CT chest in the past most recently May 14, 2024 which showed peripheral basilar predominant reticular and septal thickening with associated traction bronchiectasis minimal honeycombing.  There was no pleural effusion, pneumothorax, suspicious appearing nodules, residual scarring from left lumpectomy and axillary lymph node dissection.  Overall findings were suggestive of possible interstitial lung disease.  She also had a VQ scan performed November 18 which was negative for any acute pulmonary embolism or perfusion defect.  She did have matched decreased perfusion at both lung apices likely due to underlying emphysema.  On this admission she had a repeat CT with PE protocol with similar findings as before including mild cardiomegaly and asymmetric RV enlargement, mild hilar and mediastinal adenopathy which appears stable from June 2025 as well as diffuse ground glass opacification suggestive of alveolar pulmonary edema.  Her proBNP was over 3000 on this admission suggestive of cardiac etiology of interstitial pulmonary edema.  Her CMP is essentially unremarkable besides a mild decrement in GFR at 57.  Viral testing for flu, RSV and COVID-19 are negative.  PCCM consultation for further evaluation management.   06/12/24- patient reports minimal improvement clinically. She has  elevated CRP which seems to be due to ILD exacerbation. She will need 1000mg  solumedrol x 3 days Then taper.  06/13/24-  patient appears improved today.  She had first dose of pulsed 1g solumedrol this am at 10am.  Her sugars are only slightly higher and Im rechecking them again this afternoon.  We plan to walk today for chest physiotherapy.  She feels well enough to shower and do her hair and is smiling during interview. We discussed her pulmonary hypertension and have contacted cardiology who has seen patient and may be able to perform cardiac cath while she is inpatient.    06/14/24- patient feels no better.  She reports headache.  I have reduced steroids to 500mg  and have increased her lasix  dose.  Will see her again in am.  She should have cardiac cath coming up soon and may be ok for dc home after that.   PAST MEDICAL HISTORY   Past Medical History:  Diagnosis Date   Allergic rhinitis    Anxiety    Breast cancer (HCC) 1996   s/p left breast lumpectomy (lymph node dissection - 2/11 positive), chemotherapy   Breast cancer (HCC) 1995   right breast lumpectomy   Complication of anesthesia    slow to wake   Depression    Diabetes mellitus    Dysphagia    Family history of adverse reaction to anesthesia    Mother - slow to wake   GERD (gastroesophageal reflux disease)    H/O ulcer disease    PUD   Hypercholesterolemia    Hypertension  Hypothyroidism    multinodular goiter   Nephrolithiasis    Personal history of chemotherapy    Personal history of radiation therapy    Scoliosis    Skin cancer    SVT (supraventricular tachycardia)      SURGICAL HISTORY   Past Surgical History:  Procedure Laterality Date   ABDOMINAL HYSTERECTOMY     partial   BREAST BIOPSY Left 09/2012   benign   BREAST EXCISIONAL BIOPSY Right    1995 benign   BREAST LUMPECTOMY Left 1996   lumpectomy with chemo and rad tx for breast ca (lymph node dissection - 2/11 positive   BREAST LUMPECTOMY WITH  AXILLARY LYMPH NODE DISSECTION  1996   left   CATARACT EXTRACTION W/PHACO Left 11/29/2020   Procedure: CATARACT EXTRACTION PHACO AND INTRAOCULAR LENS PLACEMENT (IOC) LEFT DIABETIC VIVITY TORIC;  Surgeon: Jaye Fallow, MD;  Location: [facility];  Service: Ophthalmology;  Laterality: Left;  Diabetic - oral meds 6.88 00:40.7   CATARACT EXTRACTION W/PHACO Right 12/13/2020   Procedure: CATARACT EXTRACTION PHACO AND INTRAOCULAR LENS PLACEMENT (IOC) RIGHT DIABETIC VIVITY TORIC 5.44 00:40.3;  Surgeon: Jaye Fallow, MD;  Location: [facility];  Service: Ophthalmology;  Laterality: Right;  Diabetic - oral meds   COLONOSCOPY WITH PROPOFOL  N/A 06/03/2019   Procedure: COLONOSCOPY WITH PROPOFOL ;  Surgeon: Dessa Reyes ORN, MD;  Location: [facility]COPY;  Service: Endoscopy;  Laterality: N/A;   ESOPHAGOGASTRODUODENOSCOPY N/A 06/03/2019   Procedure: ESOPHAGOGASTRODUODENOSCOPY (EGD);  Surgeon: Dessa Reyes ORN, MD;  Location: [facility] ENDOSCOPY;  Service: Endoscopy;  Laterality: N/A;   LUMBAR LAMINECTOMY  09/2005   L5-S1     FAMILY HISTORY   Family History  Problem Relation Age of Onset   Alzheimer's disease Mother    Heart disease Father    Hypertension Father    Diabetes Father    Diabetes Sister    Diabetes Sister    Diabetes Sister    Diabetes Sister    Breast cancer Neg Hx      SOCIAL HISTORY   Social History   Tobacco Use   Smoking status: Former    Current packs/day: 0.00    Average packs/day: 0.5 packs/day for 40.0 years (20.0 ttl pk-yrs)    Types: Cigarettes    Start date: 04/14/1964    Quit date: 04/14/2004    Years since quitting: 20.1   Smokeless tobacco: Never  Vaping Use   Vaping status: Never Used  Substance Use Topics   Alcohol use: No    Alcohol/week: 0.0 standard drinks of alcohol   Drug use: No     MEDICATIONS      Current Medication:  Current Facility-Administered Medications:    0.9 %  sodium chloride  infusion, , Intravenous,  PRN, Hudson, Caralyn, PA-C, Last Rate: 10 mL/hr at 06/15/24 0735, New Bag at 06/15/24 0735   [MAR Hold] acetaminophen  (TYLENOL ) tablet 650 mg, 650 mg, Oral, Q6H PRN, 650 mg at 06/14/24 2050 **OR** [MAR Hold] acetaminophen  (TYLENOL ) suppository 650 mg, 650 mg, Rectal, Q6H PRN, Cleatus Delayne GAILS, MD   [MAR Hold] albuterol  (PROVENTIL ) (2.5 MG/3ML) 0.083% nebulizer solution 2.5 mg, 2.5 mg, Nebulization, Q2H PRN, Cleatus Delayne GAILS, MD   [MAR Hold] alum & mag hydroxide-simeth (MAALOX/MYLANTA) 200-200-20 MG/5ML suspension 30 mL, 30 mL, Oral, Q6H PRN, Trudy Anthony HERO, MD, 30 mL at 06/14/24 2050   [MAR Hold] atorvastatin  (LIPITOR) tablet 20 mg, 20 mg, Oral, Daily, Cleatus Delayne V, MD, 20 mg at 06/14/24 0839   [MAR Hold] azithromycin  (ZITHROMAX ) tablet 250  mg, 250 mg, Oral, Daily, Khamila Bassinger, MD, 250 mg at 06/14/24 0840   [MAR Hold] enoxaparin  (LOVENOX ) injection 40 mg, 40 mg, Subcutaneous, Q24H, Patel, Sona, MD, 40 mg at 06/14/24 2109   fentaNYL  (SUBLIMAZE ) injection, , , PRN, Paraschos, Alexander, MD, 12.5 mcg at 06/15/24 0836   [facility] Hold] free water  500 mL, 500 mL, Oral, Once, Callwood, Dwayne D, MD   [MAR Hold] furosemide  (LASIX ) injection 40 mg, 40 mg, Intravenous, Q12H, Leilanie Rauda, MD, 40 mg at 06/14/24 2050   Heparin  (Porcine) in NaCl 1000-0.9 UT/500ML-% SOLN, , , PRN, Paraschos, Alexander, MD, 1,000 mL at 06/15/24 0850   [MAR Hold] HYDROcodone -acetaminophen  (NORCO/VICODIN) 5-325 MG per tablet 1-2 tablet, 1-2 tablet, Oral, Q4H PRN, Cleatus Delayne GAILS, MD   [MAR Hold] insulin  aspart (novoLOG ) injection 0-20 Units, 0-20 Units, Subcutaneous, TID WC, Cleatus Delayne GAILS, MD   [facility] Hold] insulin  aspart (novoLOG ) injection 0-5 Units, 0-5 Units, Subcutaneous, QHS, Cleatus, Delayne GAILS, MD   insulin  aspart (novoLOG ) injection 3 Units, 3 Units, Subcutaneous, TID WC, Trudy Anthony HERO, MD   insulin  glargine (LANTUS ) injection 10 Units, 10 Units, Subcutaneous, Daily, Trudy Anthony HERO, MD   iohexol  (OMNIPAQUE )  300 MG/ML solution, , , PRN, Paraschos, Alexander, MD, 80 mL at 06/15/24 0858   [MAR Hold] isosorbide  mononitrate (IMDUR ) 24 hr tablet 30 mg, 30 mg, Oral, Daily, Cleatus Delayne V, MD, 30 mg at 06/14/24 0839   [MAR Hold] levothyroxine  (SYNTHROID ) tablet 25 mcg, 25 mcg, Oral, Q0600, Cleatus Delayne GAILS, MD, 25 mcg at 06/14/24 0842   lidocaine  (PF) (XYLOCAINE ) 1 % injection, , , PRN, Paraschos, Alexander, MD, 10 mL at 06/15/24 0852   [MAR Hold] losartan  (COZAAR ) tablet 25 mg, 25 mg, Oral, Daily, Lister Brizzi, MD   [MAR Hold] methylPREDNISolone  sodium succinate (SOLU-MEDROL ) 500 mg in sodium chloride  0.9 % 50 mL IVPB, 500 mg, Intravenous, Daily, Tavon Magnussen, MD   [MAR Hold] metoprolol  succinate (TOPROL -XL) 24 hr tablet 12.5 mg, 12.5 mg, Oral, Daily, Hudson, Caralyn, PA-C, 12.5 mg at 06/14/24 9160   midazolam  PF (VERSED ) injection, , , PRN, Paraschos, Alexander, MD, 0.5 mg at 06/15/24 0836   [MAR Hold] ondansetron  (ZOFRAN ) tablet 4 mg, 4 mg, Oral, Q6H PRN **OR** [MAR Hold] ondansetron  (ZOFRAN ) injection 4 mg, 4 mg, Intravenous, Q6H PRN, Cleatus Delayne GAILS, MD    ALLERGIES   Patient has no known allergies.     REVIEW OF SYSTEMS    Review of Systems:  Gen:  Denies  fever, sweats, chills weigh loss  HEENT: Denies blurred vision, double vision, ear pain, eye pain, hearing loss, nose bleeds, sore throat Cardiac:  No dizziness, chest pain or heaviness, chest tightness,edema Resp:   reports dyspnea chronically  Gi: Denies swallowing difficulty, stomach pain, nausea or vomiting, diarrhea, constipation, bowel incontinence Gu:  Denies bladder incontinence, burning urine Ext:   Denies Joint pain, stiffness or swelling Skin: Denies  skin rash, easy bruising or bleeding or hives Endoc:  Denies polyuria, polydipsia , polyphagia or weight change Psych:   Denies depression, insomnia or hallucinations   Other:  All other systems negative   VS: BP (!) 142/71   Pulse (!) 58   Temp 98.2 F (36.8 C)  (Oral)   Resp 13   Ht 5' 4 (1.626 m)   Wt 69.2 kg   SpO2 92%   BMI 26.19 kg/m      PHYSICAL EXAM    GENERAL:NAD, no fevers, chills, no weakness no fatigue HEAD: Normocephalic, atraumatic.  EYES: Pupils equal,  round, reactive to light. Extraocular muscles intact. No scleral icterus.  MOUTH: Moist mucosal membrane. Dentition intact. No abscess noted.  EAR, NOSE, THROAT: Clear without exudates. No external lesions.  NECK: Supple. No thyromegaly. No nodules. No JVD.  PULMONARY: decreased breath sounds with mild rhonchi worse at bases bilaterally.  CARDIOVASCULAR: S1 and S2. Regular rate and rhythm. No murmurs, rubs, or gallops. No edema. Pedal pulses 2+ bilaterally.  GASTROINTESTINAL: Soft, nontender, nondistended. No masses. Positive bowel sounds. No hepatosplenomegaly.  MUSCULOSKELETAL: No swelling, clubbing, or edema. Range of motion full in all extremities.  NEUROLOGIC: Cranial nerves II through XII are intact. No gross focal neurological deficits. Sensation intact. Reflexes intact.  SKIN: No ulceration, lesions, rashes, or cyanosis. Skin warm and dry. Turgor intact.  PSYCHIATRIC: Mood, affect within normal limits. The patient is awake, alert and oriented x 3. Insight, judgment intact.       IMAGING   Narrative & Impression  EXAM: CTA of the Chest with contrast for PE 06/11/2024 01:13:52 AM   TECHNIQUE: CTA of the chest was performed after the administration of 75 mL of iohexol  (OMNIPAQUE ) 350 MG/ML injection. Multiplanar reformatted images are provided for review. MIP images are provided for review. Automated exposure control, iterative reconstruction, and/or weight based adjustment of the mA/kV was utilized to reduce the radiation dose to as low as reasonably achievable.   COMPARISON: Prior examination 12/2023 and more remote prior examination of 10/26/2023.   CLINICAL HISTORY: Pulmonary embolism (PE) suspected, high prob. Breast cancer. *tracking code: Bo*    FINDINGS:   PULMONARY ARTERIES: Pulmonary arteries are adequately opacified for evaluation. No pulmonary embolism. The central pulmonary arteries are enlarged in keeping with changes of pulmonary arterial hypertension. Main pulmonary artery is normal in caliber.   MEDIASTINUM: Mild coronary artery calcification. Mild cardiomegaly with asymmetric enlargement of the right heart chambers in keeping with elevated right heart pressure. No pericardial effusion. Mild atherosclerotic calcifications within the thoracic aorta. No aortic aneurysm.   LYMPH NODES: Pathologic mediastinal and bilateral hilar adenopathy is present with the index lymph node measuring 17 mm in short axis diameter within the subcarinal lymph node group and 18 mm in diameter within the right hilar lymph node groove (54/4). These appear stable since the prior examination 12/2023 and more remote prior examination of 10/26/2023 possible reflecting the sequela of treated disease, and inflammatory processes such as sarcoidosis, or a low-grade lymphoproliferative process.   LUNGS AND PLEURA: Mild diffuse ground-glass pulmonary opacity is again seen demonstrating a basilar predominance, progressive since prior examination and most suggestive of mild alveolar pulmonary edema. No pneumothorax or pleural effusion.   UPPER ABDOMEN: Limited images of the upper abdomen are unremarkable.   SOFT TISSUES AND BONES: No acute bone or soft tissue abnormality.   IMPRESSION: 1. No pulmonary embolism. 2. Cardiomegaly with asymmetric enlargement of the right heart chambers been with elevated right heart pressure. 3. Morphologic changes in keeping with pulmonary arterial hypertension. 4. Mild diffuse ground-glass pulmonary opacity with basilar predominance, progressive since prior examination, most suggestive of mild alveolar pulmonary edema. 5. Pathologic mediastinal and bilateral hilar adenopathy, stable since prior examination,  possibly reflecting sequela of treated disease, inflammatory processes such as sarcoidosis, or a low-grade lymphoproliferative process.   Electronically signed by: Dorethia Molt MD 06/11/2024 01:25 AM EST RP Workstation: [id number]   ANA Comprehensive Panel - [facility] Specimen: Blood Component Ref Range & Units 1 mo ago Comments  Anti-DNA (DS) Ab Qn - [facility] 0 - 9 IU/mL <1  Negative      <5                                    Equivocal  5 - 9                                    Positive      >9  RNP Antibodies - [facility] 0.0 - 0.9 AI <0.2   Smith Antibodies - [facility] 0.0 - 0.9 AI <0.2   Antiscleroderma-70 Antibodies - [facility] 0.0 - 0.9 AI <0.2   Sjogren's Anti-SS-A - [facility] 0.0 - 0.9 AI <0.2   Sjogren's Anti-SS-B - [facility] 0.0 - 0.9 AI <0.2   Antichromatin Antibodies - [facility] 0.0 - 0.9 AI <0.2   Anti-Jo-1 - [facility] 0.0 - 0.9 AI <0.2   Anti-Centromere B Antibodies - [facility] 0.0 - 0.9 AI <0.2   See below: - [facility] Comment Autoantibody                       Disease Association ------------------------------------------------------------                         Condition                  Frequency ---------------------   ------------------------   --------- Antinuclear Antibody,    SLE, mixed connective Direct (ANA-D)           tissue diseases ---------------------   ------------------------   --------- dsDNA                    SLE                        40 - 60% ---------------------   ------------------------   --------- Chromatin                Drug induced SLE                90%                          SLE                        48 - 97% ---------------------   ------------------------   --------- SSA (Ro)                 SLE                        25 - 35%                          Sjogren's Syndrome         40 - 70%                          Neonatal Lupus                 100% ---------------------   ------------------------    --------- SSB (La)                 SLE  10%                          Sjogren's Syndrome              30% ---------------------   -----------------------    --------- Sm (anti-Smith)          SLE                        15 - 30% ---------------------   -----------------------    --------- RNP                      Mixed Connective Tissue                          Disease                         95% (U1 nRNP,                SLE                        30 - 50% anti-ribonucleoprotein)  Polymyositis and/or                          Dermatomyositis                 20% ---------------------   ------------------------   --------- Scl-70 (antiDNA          Scleroderma (diffuse)      20 - 35% topoisomerase)           Crest                           13% ---------------------   ------------------------   --------- Jo-1                     Polymyositis and/or                          Dermatomyositis            20 - 40% ---------------------   ------------------------   --------- Centromere B             Scleroderma - Crest                          variant                         80%     ASSESSMENT/PLAN   Acute hypoxemic respiratory failure - Patient recently seen in pulmonology clinic-her most recent CT chest was suggestive of underlying interstitial lung disease with reticular nodular peripheral opacification and septal thickening as well as mild honeycombing.  Current CT chest shows similar findings to prior with suggestion of possible underlying sarcoidosis or a low-grade lymphoproliferative process. -  autoimmune workup was negative which was performed with [facility] last month and including rheumatoid factor negative, ANA comprehensive panel negative, Myo marker panel negative, hypersensitivity pneumonitis negative, mold profile negative, allergy RAST panel negative. - elevated ProBNP - s/p diuresis with lasix  which helped and we can continue this. Currently net  negative 1300cc -CRP elevated   RVP  not done - patient currently on 4L/min     Interstitial lung disease     - continue pulse dose solumedrol daily x 3 days then taper     - will start immunomodulator medication after this   Pulmonary hypertension  NYHA 2-3 Group 3 due to ILD ?  -cardiology on case - will request RHC to be done to check         Thank you for allowing me to participate in the care of this patient.   Patient/Family are satisfied with care plan and all questions have been answered.    Provider disclosure: Patient with at least one acute or chronic illness or injury that poses a threat to life or bodily function and is being managed actively during this encounter.  All of the below services have been performed independently by signing provider:  review of prior documentation from internal and or external health records.  Review of previous and current lab results.  Interview and comprehensive assessment during patient visit today. Review of current and previous chest radiographs/CT scans. Discussion of management and test interpretation with health care team and patient/family.   This document was prepared using Dragon voice recognition software and may include unintentional dictation errors.     Keiffer Piper, M.D.  Division of Pulmonary & Critical Care Medicine

## 2024-06-15 NOTE — Progress Notes (Signed)
 [facility] CLINIC CARDIOLOGY PROGRESS NOTE       Patient ID: Michelle Hawkins MRN: [id number] DOB/AGE: [birth year] 78 y.o.  Admit date: 06/10/2024 Referring Physician Dr. Delayne Solian Primary Physician Sherial Bail, MD  Primary Cardiologist Dr. Ammon Reason for Consultation trigeminy, chest pain  HPI: Michelle Hawkins is a 78 y.o. female  with a past medical history of palpitations, COPD, hypertension, hyperlipidemia, nonobstructive coronary disease by CTA 10/2023 who presented to the ED on 06/10/2024 for chest pain. Cardiology was consulted for further evaluation.   Interval history: - Patient seen and examined this morning, resting comfortably after heart cath this AM. - States that she is feeling ok, SOB overall better compared to last week.  - Denies any recurrence of chest pain.  BP and HR stable.   Review of systems complete and found to be negative unless listed above    Past Medical History:  Diagnosis Date   Allergic rhinitis    Anxiety    Breast cancer (HCC) 1996   s/p left breast lumpectomy (lymph node dissection - 2/11 positive), chemotherapy   Breast cancer (HCC) 1995   right breast lumpectomy   Complication of anesthesia    slow to wake   Depression    Diabetes mellitus    Dysphagia    Family history of adverse reaction to anesthesia    Mother - slow to wake   GERD (gastroesophageal reflux disease)    H/O ulcer disease    PUD   Hypercholesterolemia    Hypertension    Hypothyroidism    multinodular goiter   Nephrolithiasis    Personal history of chemotherapy    Personal history of radiation therapy    Scoliosis    Skin cancer    SVT (supraventricular tachycardia)     Past Surgical History:  Procedure Laterality Date   ABDOMINAL HYSTERECTOMY     partial   BREAST BIOPSY Left 09/2012   benign   BREAST EXCISIONAL BIOPSY Right    1995 benign   BREAST LUMPECTOMY Left 1996   lumpectomy with chemo and rad tx for breast ca (lymph node dissection - 2/11  positive   BREAST LUMPECTOMY WITH AXILLARY LYMPH NODE DISSECTION  1996   left   CATARACT EXTRACTION W/PHACO Left 11/29/2020   Procedure: CATARACT EXTRACTION PHACO AND INTRAOCULAR LENS PLACEMENT (IOC) LEFT DIABETIC VIVITY TORIC;  Surgeon: Jaye Fallow, MD;  Location: [facility];  Service: Ophthalmology;  Laterality: Left;  Diabetic - oral meds 6.88 00:40.7   CATARACT EXTRACTION W/PHACO Right 12/13/2020   Procedure: CATARACT EXTRACTION PHACO AND INTRAOCULAR LENS PLACEMENT (IOC) RIGHT DIABETIC VIVITY TORIC 5.44 00:40.3;  Surgeon: Jaye Fallow, MD;  Location: [facility];  Service: Ophthalmology;  Laterality: Right;  Diabetic - oral meds   COLONOSCOPY WITH PROPOFOL  N/A 06/03/2019   Procedure: COLONOSCOPY WITH PROPOFOL ;  Surgeon: Dessa Reyes LELON, MD;  Location: [facility] ENDOSCOPY;  Service: Endoscopy;  Laterality: N/A;   ESOPHAGOGASTRODUODENOSCOPY N/A 06/03/2019   Procedure: ESOPHAGOGASTRODUODENOSCOPY (EGD);  Surgeon: Dessa Reyes LELON, MD;  Location: [facility] ENDOSCOPY;  Service: Endoscopy;  Laterality: N/A;   LUMBAR LAMINECTOMY  09/2005   L5-S1    Medications Prior to Admission  Medication Sig Dispense Refill Last Dose/Taking   acetaminophen  (TYLENOL ) 500 MG tablet Take 500 mg by mouth every 6 (six) hours as needed for mild pain (pain score 1-3).   Taking As Needed   albuterol  (VENTOLIN  HFA) 108 (90 Base) MCG/ACT inhaler Inhale 1-2 puffs into the lungs every 4 (four) hours as needed.  Taking As Needed   aspirin  81 MG tablet Take 162 mg by mouth every evening.   Taking   atorvastatin  (LIPITOR) 20 MG tablet TAKE ONE TABLET EVERY DAY 90 tablet 2 Taking   Biotin 5 MG CAPS Take 5 mg by mouth daily.   Taking   calcium  citrate (CALCITRATE - DOSED IN MG ELEMENTAL CALCIUM ) 950 MG tablet Take 200 mg of elemental calcium  by mouth daily.   Taking   cyclobenzaprine (FLEXERIL) 10 MG tablet Take 10 mg by mouth 2 (two) times daily as needed.   Unknown   escitalopram  (LEXAPRO ) 20 MG  tablet TAKE ONE TABLET EVERY DAY 90 tablet 1 Taking   fish oil-omega-3 fatty acids 1000 MG capsule Take 2 g by mouth daily.   Taking   fluticasone -salmeterol (ADVAIR) 250-50 MCG/ACT AEPB Inhale 1 puff into the lungs 2 (two) times daily.   Taking   levothyroxine  (SYNTHROID ) 25 MCG tablet TAKE 1 TABLET EVERY DAY ON EMPTY STOMACHWITH A GLASS OF WATER  AT LEAST 30-60 MINBEFORE BREAKFAST 90 tablet 1 Taking   losartan  (COZAAR ) 100 MG tablet TAKE 1 TABLET BY MOUTH DAILY 90 tablet 1 Taking   metFORMIN  (GLUCOPHAGE -XR) 500 MG 24 hr tablet TAKE TWO TABLETS TWICE DAILY (Patient taking differently: Take 500-1,000 mg by mouth 2 (two) times daily. 500mg  qam and 1,000mg  qpm) 360 tablet 3 Taking Differently   metoprolol  succinate (TOPROL -XL) 50 MG 24 hr tablet TAKE 1 TABLET BY MOUTH DAILY WITH OR FOLLOWING A MEAL 90 tablet 1 Taking   TURMERIC PO Take by mouth daily.    Taking   Accu-Chek Softclix Lancets lancets Check blood sugar twice daily Dx 250.00 200 each 1    Blood Glucose Monitoring Suppl (ONE TOUCH ULTRA SYSTEM KIT) w/Device KIT 1 kit by Does not apply route once.      cholecalciferol (VITAMIN D) 400 UNITS TABS Take by mouth. (Patient not taking: Reported on 06/11/2024)   Not Taking   Continuous Blood Gluc Receiver (FREESTYLE LIBRE 14 DAY READER) DEVI Inject 1 Device into the skin every 14 (fourteen) days. Place 1 sensor every 14 days. Use to check sugar at least 4 times daily 6 each 3    Continuous Blood Gluc Sensor (FREESTYLE LIBRE 14 DAY SENSOR) MISC Inject 1 Device into the skin every 14 (fourteen) days. 6 each 3    ferrous sulfate 325 (65 FE) MG EC tablet Take 325 mg by mouth daily with breakfast. (Patient not taking: Reported on 06/11/2024)   Not Taking   glucose blood (ONE TOUCH ULTRA TEST) test strip CHECK BLOOD SUGAR UP TO THREE TIMES DAILY Dx E11.9 300 each 3    isosorbide  mononitrate (IMDUR ) 30 MG 24 hr tablet Take 1 tablet (30 mg total) by mouth daily. (Patient not taking: Reported on 06/11/2024) 30  tablet 2 Not Taking   Multiple Vitamin (MULTIVITAMIN WITH MINERALS) TABS tablet Take 1 tablet by mouth daily. (Patient not taking: Reported on 06/11/2024)   Not Taking   omeprazole  (PRILOSEC) 40 MG capsule TAKE 1 CAPSULE EVERY DAY (Patient not taking: Reported on 06/11/2024) 90 capsule 1 Not Taking   Probiotic Product (PROBIOTIC DAILY PO) Take 1 tablet by mouth. (Patient not taking: Reported on 06/11/2024)   Not Taking   Social History   Socioeconomic History   Marital status: Widowed    Spouse name: Not on file   Number of children: Not on file   Years of education: Not on file   Highest education level: Not on file  Occupational History  Not on file  Tobacco Use   Smoking status: Former    Current packs/day: 0.00    Average packs/day: 0.5 packs/day for 40.0 years (20.0 ttl pk-yrs)    Types: Cigarettes    Start date: 04/14/1964    Quit date: 04/14/2004    Years since quitting: 20.1   Smokeless tobacco: Never  Vaping Use   Vaping status: Never Used  Substance and Sexual Activity   Alcohol use: No    Alcohol/week: 0.0 standard drinks of alcohol   Drug use: No   Sexual activity: Never  Other Topics Concern   Not on file  Social History Narrative   Not on file   Social Drivers of Health   Financial Resource Strain: Low Risk  (11/21/2023)   Received from [facility]   Overall Financial Resource Strain (CARDIA)    Difficulty of Paying Living Expenses: Not hard at all  Food Insecurity: No Food Insecurity (06/12/2024)   Hunger Vital Sign    Worried About Running Out of Food in the Last Year: Never true    Ran Out of Food in the Last Year: Never true  Transportation Needs: No Transportation Needs (06/12/2024)   PRAPARE - Administrator, Civil Service (Medical): No    Lack of Transportation (Non-Medical): No  Physical Activity: Not on file  Stress: Not on file  Social Connections: Moderately Integrated (06/12/2024)   Social Connection and Isolation  Panel    Frequency of Communication with Friends and Family: More than three times a week    Frequency of Social Gatherings with Friends and Family: More than three times a week    Attends Religious Services: More than 4 times per year    Active Member of [employer] or Organizations: Yes    Attends Banker Meetings: More than 4 times per year    Marital Status: Widowed  Intimate Partner Violence: Not At Risk (06/12/2024)   Humiliation, Afraid, Rape, and Kick questionnaire    Fear of Current or Ex-Partner: No    Emotionally Abused: No    Physically Abused: No    Sexually Abused: No    Family History  Problem Relation Age of Onset   Alzheimer's disease Mother    Heart disease Father    Hypertension Father    Diabetes Father    Diabetes Sister    Diabetes Sister    Diabetes Sister    Diabetes Sister    Breast cancer Neg Hx      Vitals:   06/15/24 0858 06/15/24 0903 06/15/24 0908 06/15/24 0927  BP: 128/65 131/71 (!) 142/71 (!) 140/69  Pulse: 62 (!) 59 (!) 58 (!) 58  Resp: 17 (!) 21 13 14   Temp:    (!) 96.8 F (36 C)  TempSrc:    Temporal  SpO2: 90% 92%  97%  Weight:      Height:        PHYSICAL EXAM General: Chronically ill-appearing elderly female, well nourished, in no acute distress. HEENT: Normocephalic and atraumatic. Neck: No JVD.  Lungs: Normal respiratory effort on 2L Natalia. Clear bilaterally to auscultation. No wheezes, crackles, rhonchi.  Heart: HRRR. Normal S1 and S2 without gallops or murmurs.  Abdomen: Non-distended appearing.  Msk: Normal strength and tone for age. Extremities: Warm and well perfused. No clubbing, cyanosis.  No edema.  Neuro: Alert and oriented X 3. Psych: Answers questions appropriately.   Labs: Basic Metabolic Panel: Recent Labs    06/14/24 2110 06/15/24 9666  06/15/24 0813 06/15/24 0828  NA 128* 133* 135 137  K 4.3 4.6 4.5 4.3  CL 92* 96*  --   --   CO2 22 24  --   --   GLUCOSE 429* 278*  --   --   BUN 29* 29*  --   --    CREATININE 1.03* 0.96  --   --   CALCIUM  8.8* 8.8*  --   --    Liver Function Tests: No results for input(s): AST, ALT, ALKPHOS, BILITOT, PROT, ALBUMIN  in the last 72 hours.  No results for input(s): LIPASE, AMYLASE in the last 72 hours. CBC: Recent Labs    06/15/24 0333 06/15/24 0813 06/15/24 0828  WBC 14.1*  --   --   HGB 13.8 14.6 13.9  HCT 41.6 43.0 41.0  MCV 86.1  --   --   PLT 173  --   --    Cardiac Enzymes: No results for input(s): CKTOTAL, CKMB, CKMBINDEX, TROPONINIHS in the last 72 hours. BNP: No results for input(s): BNP in the last 72 hours. D-Dimer: No results for input(s): DDIMER in the last 72 hours. Hemoglobin A1C: No results for input(s): HGBA1C in the last 72 hours.  Fasting Lipid Panel: No results for input(s): CHOL, HDL, LDLCALC, TRIG, CHOLHDL, LDLDIRECT in the last 72 hours. Thyroid  Function Tests: No results for input(s): TSH, T4TOTAL, T3FREE, THYROIDAB in the last 72 hours.  Invalid input(s): FREET3 Anemia Panel: No results for input(s): VITAMINB12, FOLATE, FERRITIN, TIBC, IRON , RETICCTPCT in the last 72 hours.   Radiology: CARDIAC CATHETERIZATION Result Date: 06/15/2024   Prox RCA lesion is 20% stenosed.   The left ventricular systolic function is normal.   LV end diastolic pressure is normal.   The left ventricular ejection fraction is 55-65% by visual estimate.   Hemodynamic findings consistent with moderate pulmonary hypertension.   There is mild (2+) mitral regurgitation. 1.  Mild to moderate pulmonary hypertension, PA 59/23  (36) 2.  Near normal coronary anatomy 3.  Normal left ventricular function,  PW 23/13  (19) 4.  Mild mitral regurgitation Recommendations 1.  Agree with overall current therapy 2.  Diuresis as needed 3.  Continue good medical management 4.  Follow-up with Dr.  Aleskerov 5.  Follow-up with me 1 to 2 weeks 6.  May discharge home later today   ECHOCARDIOGRAM  COMPLETE Result Date: 06/11/2024    ECHOCARDIOGRAM REPORT   Patient Name:   DAYLE SHERPA Date of Exam: 06/11/2024 Medical Rec #:  [id number]    Height:       64.0 in Accession #:    [id number]   Weight:       132.0 lb Date of Birth:  April 20, 1946    BSA:          1.640 m Patient Age:    78 years     BP:           122/93 mmHg Patient Gender: F            HR:           50 bpm. Exam Location:  [facility] Procedure: 2D Echo, Color Doppler, Cardiac Doppler and Intracardiac            Opacification Agent (Both Spectral and Color Flow Doppler were            utilized during procedure). Indications:     CHF-Acute Diastolic I50.31  History:         Patient has  prior history of Echocardiogram examinations, most                  recent 10/27/2023. CHF.  Sonographer:     Ashley McNeely-Sloane Referring Phys:  [id number] DELAYNE LULLA SOLIAN Diagnosing Phys: Cara JONETTA Lovelace MD IMPRESSIONS  1. Left ventricular ejection fraction, by estimation, is 60 to 65%. The left ventricle has normal function. The left ventricle has no regional wall motion abnormalities. Left ventricular diastolic parameters are consistent with Grade I diastolic dysfunction (impaired relaxation). There is the interventricular septum is flattened in diastole ('D' shaped left ventricle), consistent with right ventricular volume overload.  2. Right ventricular systolic function is mildly reduced. The right ventricular size is moderately enlarged.  3. The mitral valve is grossly normal. Moderate mitral valve regurgitation.  4. The aortic valve is normal in structure. Aortic valve regurgitation is not visualized. FINDINGS  Left Ventricle: Left ventricular ejection fraction, by estimation, is 60 to 65%. The left ventricle has normal function. The left ventricle has no regional wall motion abnormalities. Definity  contrast agent was given IV to delineate the left ventricular  endocardial borders. Strain was performed and the global longitudinal strain is indeterminate. The left  ventricular internal cavity size was normal in size. There is no left ventricular hypertrophy. The interventricular septum is flattened in diastole ('D' shaped left ventricle), consistent with right ventricular volume overload. Left ventricular diastolic parameters are consistent with Grade I diastolic dysfunction (impaired relaxation). Right Ventricle: The right ventricular size is moderately enlarged. No increase in right ventricular wall thickness. Right ventricular systolic function is mildly reduced. Left Atrium: Left atrial size was normal in size. Right Atrium: Right atrial size was normal in size. Pericardium: There is no evidence of pericardial effusion. Mitral Valve: The mitral valve is grossly normal. Moderate mitral valve regurgitation. MV peak gradient, 4.2 mmHg. The mean mitral valve gradient is 1.0 mmHg. Tricuspid Valve: The tricuspid valve is normal in structure. Tricuspid valve regurgitation is mild. Aortic Valve: The aortic valve is normal in structure. Aortic valve regurgitation is not visualized. Aortic valve mean gradient measures 2.0 mmHg. Aortic valve peak gradient measures 4.5 mmHg. Aortic valve area, by VTI measures 1.91 cm. Pulmonic Valve: The pulmonic valve was normal in structure. Pulmonic valve regurgitation is not visualized. Aorta: The ascending aorta was not well visualized. IAS/Shunts: No atrial level shunt detected by color flow Doppler. Additional Comments: 3D was performed not requiring image post processing on an independent workstation and was indeterminate.  LEFT VENTRICLE PLAX 2D LVIDd:         3.90 cm     Diastology LVIDs:         2.00 cm     LV e' medial:    4.79 cm/s LV PW:         1.00 cm     LV E/e' medial:  11.8 LV IVS:        1.00 cm     LV e' lateral:   5.55 cm/s LVOT diam:     1.70 cm     LV E/e' lateral: 10.2 LV SV:         50 LV SV Index:   30 LVOT Area:     2.27 cm  LV Volumes (MOD) LV vol d, MOD A2C: 45.7 ml LV vol d, MOD A4C: 49.6 ml LV vol s, MOD A2C: 15.7 ml  LV vol s, MOD A4C: 16.7 ml LV SV MOD A2C:     30.0 ml LV SV MOD A4C:  49.6 ml LV SV MOD BP:      31.3 ml RIGHT VENTRICLE            IVC RV Basal diam:  4.10 cm    IVC diam: 2.70 cm RV Mid diam:    3.70 cm RV S prime:     9.57 cm/s TAPSE (M-mode): 2.2 cm LEFT ATRIUM             Index        RIGHT ATRIUM           Index LA diam:        2.70 cm 1.65 cm/m   RA Area:     18.70 cm LA Vol (A2C):   26.2 ml 15.98 ml/m  RA Volume:   54.80 ml  33.42 ml/m LA Vol (A4C):   54.5 ml 33.24 ml/m LA Biplane Vol: 38.3 ml 23.36 ml/m  AORTIC VALVE                    PULMONIC VALVE AV Area (Vmax):    2.23 cm     PV Vmax:          0.78 m/s AV Area (Vmean):   2.11 cm     PV Vmean:         52.700 cm/s AV Area (VTI):     1.91 cm     PV VTI:           0.184 m AV Vmax:           106.00 cm/s  PV Peak grad:     2.4 mmHg AV Vmean:          68.800 cm/s  PV Mean grad:     1.0 mmHg AV VTI:            0.261 m      PR End Diast Vel: 2.65 msec AV Peak Grad:      4.5 mmHg     RVOT Peak grad:   1 mmHg AV Mean Grad:      2.0 mmHg LVOT Vmax:         104.00 cm/s LVOT Vmean:        63.900 cm/s LVOT VTI:          0.220 m LVOT/AV VTI ratio: 0.84  AORTA Ao Root diam: 2.90 cm Ao Asc diam:  3.10 cm MITRAL VALVE               TRICUSPID VALVE MV Area (PHT): 2.54 cm    TR Peak grad:   63.7 mmHg MV Area VTI:   1.50 cm    TR Mean grad:   27.0 mmHg MV Peak grad:  4.2 mmHg    TR Vmax:        399.00 cm/s MV Mean grad:  1.0 mmHg    TR Vmean:       251.0 cm/s MV Vmax:       1.02 m/s MV Vmean:      47.1 cm/s   SHUNTS MV Decel Time: 299 msec    Systemic VTI:  0.22 m MV E velocity: 56.60 cm/s  Systemic Diam: 1.70 cm MV A velocity: 83.30 cm/s  Pulmonic VTI:  0.114 m MV E/A ratio:  0.68 Dwayne D Callwood MD Electronically signed by Cara JONETTA Lovelace MD Signature Date/Time: 06/11/2024/8:17:45 PM    Final    CT Angio Chest Pulmonary Embolism (PE) W or WO Contrast Result Date: 06/11/2024 EXAM: CTA of the Chest  with contrast for PE 06/11/2024 01:13:52 AM TECHNIQUE:  CTA of the chest was performed after the administration of 75 mL of iohexol  (OMNIPAQUE ) 350 MG/ML injection. Multiplanar reformatted images are provided for review. MIP images are provided for review. Automated exposure control, iterative reconstruction, and/or weight based adjustment of the mA/kV was utilized to reduce the radiation dose to as low as reasonably achievable. COMPARISON: Prior examination 12/2023 and more remote prior examination of 10/26/2023. CLINICAL HISTORY: Pulmonary embolism (PE) suspected, high prob. Breast cancer. *tracking code: Bo* FINDINGS: PULMONARY ARTERIES: Pulmonary arteries are adequately opacified for evaluation. No pulmonary embolism. The central pulmonary arteries are enlarged in keeping with changes of pulmonary arterial hypertension. Main pulmonary artery is normal in caliber. MEDIASTINUM: Mild coronary artery calcification. Mild cardiomegaly with asymmetric enlargement of the right heart chambers in keeping with elevated right heart pressure. No pericardial effusion. Mild atherosclerotic calcifications within the thoracic aorta. No aortic aneurysm. LYMPH NODES: Pathologic mediastinal and bilateral hilar adenopathy is present with the index lymph node measuring 17 mm in short axis diameter within the subcarinal lymph node group and 18 mm in diameter within the right hilar lymph node groove (54/4). These appear stable since the prior examination 12/2023 and more remote prior examination of 10/26/2023 possible reflecting the sequela of treated disease, and inflammatory processes such as sarcoidosis, or a low-grade lymphoproliferative process. LUNGS AND PLEURA: Mild diffuse ground-glass pulmonary opacity is again seen demonstrating a basilar predominance, progressive since prior examination and most suggestive of mild alveolar pulmonary edema. No pneumothorax or pleural effusion. UPPER ABDOMEN: Limited images of the upper abdomen are unremarkable. SOFT TISSUES AND BONES: No acute  bone or soft tissue abnormality. IMPRESSION: 1. No pulmonary embolism. 2. Cardiomegaly with asymmetric enlargement of the right heart chambers been with elevated right heart pressure. 3. Morphologic changes in keeping with pulmonary arterial hypertension. 4. Mild diffuse ground-glass pulmonary opacity with basilar predominance, progressive since prior examination, most suggestive of mild alveolar pulmonary edema. 5. Pathologic mediastinal and bilateral hilar adenopathy, stable since prior examination, possibly reflecting sequela of treated disease, inflammatory processes such as sarcoidosis, or a low-grade lymphoproliferative process. Electronically signed by: Dorethia Molt MD 06/11/2024 01:25 AM EST RP Workstation: [id number]   DG Chest 2 View Result Date: 06/11/2024 EXAM: 2 VIEW(S) XRAY OF THE CHEST 06/10/2024 11:56:00 PM COMPARISON: 05/26/2024 CLINICAL HISTORY: cough, chest pain FINDINGS: LUNGS AND PLEURA: No focal pulmonary opacity. No pleural effusion. No pneumothorax. HEART AND MEDIASTINUM: No acute abnormality of the cardiac and mediastinal silhouettes. BONES AND SOFT TISSUES: Left axillary surgical clips noted. Thoracolumbar scoliosis stable. No acute osseous abnormality. IMPRESSION: 1. No acute cardiopulmonary process. Electronically signed by: Norman Gatlin MD 06/11/2024 12:05 AM EST RP Workstation: [id number]   NM Pulmonary Perfusion Result Date: 05/26/2024 CLINICAL DATA:  Worsening shortness of breath for 6 months. Elevated D-dimer levels. EXAM: NUCLEAR MEDICINE PERFUSION LUNG SCAN TECHNIQUE: Perfusion images were obtained in multiple projections after intravenous injection of radiopharmaceutical. No ventilation imaging performed. RADIOPHARMACEUTICALS:  3.86 mCi Tc-38m MAA IV COMPARISON:  Chest radiographs 05/26/2024, noncontrast chest CT 05/14/2024 and chest CTA 10/26/2023 FINDINGS: There is patchy decreased perfusion in both upper lobes, most likely secondary to the emphysema demonstrated on  the patient's CT. There are no wedge-shaped perfusion defects to suggest acute pulmonary embolism. IMPRESSION: 1. No evidence of acute pulmonary embolism on perfusion scintigraphy by PISAPED criteria. 2. Decreased perfusion at both lung apices, likely due to emphysema. Electronically Signed   By: Elsie Perone M.D.   On: 05/26/2024 13:50  DG Chest 2 View Result Date: 05/26/2024 CLINICAL DATA:  Shortness of breath. EXAM: CHEST - 2 VIEW COMPARISON:  Chest radiograph dated 10/26/2023. FINDINGS: No focal consolidation, pleural effusion or pneumothorax. The cardiac silhouette is within normal limits. No acute osseous pathology. Degenerative changes of the spine. Left axillary surgical clips. IMPRESSION: No active cardiopulmonary disease. Electronically Signed   By: Vanetta Chou M.D.   On: 05/26/2024 13:02    ECHO as above  TELEMETRY (personally reviewed): sinus rhythm rate 50s, PACs  EKG (personally reviewed): Sinus bradycardia rate 57 bpm  Data reviewed by me 06/15/2024: last 24h vitals tele labs imaging I/O ED provider note, admission H&P, hospitalist progress note, pulmonology notes  Principal Problem:   Chest pain Active Problems:   Diabetes mellitus (HCC)   History of breast cancer s/p chemoradiation   Possible new onset CHF (congestive heart failure) (HCC)   COPD (chronic obstructive pulmonary disease) (HCC)   Thrombocytopenia, new   Mediastinal lymphadenopathy   Ventricular trigeminy    ASSESSMENT AND PLAN:  Michelle Hawkins is a 78 y.o. female  with a past medical history of palpitations, COPD, hypertension, hyperlipidemia, nonobstructive coronary disease by CTA 10/2023 who presented to the ED on 06/10/2024 for chest pain. Cardiology was consulted for further evaluation.   # Noncardiac chest pain # Occasional PVCs # COPD Patient presented after episode of chest discomfort last night which started while watching TV, troponin is normal x 2.  EKG without acute ischemic changes.  Had  CTA coronary earlier this year which revealed nonobstructive coronary artery disease.  Episode of trigeminy overnight last night, now with occasional PVCs noted on telemetry.  She has known history of this.  Echo this admission with EF 60 to 65%, no wall motion abnormalities, grade 1 diastolic dysfunction, intraventricular septum flattening in diastole, mildly reduced RV function with moderate enlargement, moderate MR.  CTA chest on admission was negative for PE. R+LHC this AM with mild to moderate pulmonary hypertension, no significant obstructive CAD.  - Continue losartan  25 mg daily, metoprolol  succinate 12.5 mg daily, imudr 30 mg daily.  - Transition to PO lasix  40 mg daily. Consider addition of MRA, SLGT2i outpatient. - Continue atorvastatin  20 mg daily and aspirin  81 mg daily.  Ok for discharge today from a cardiac perspective. Will arrange for follow up in clinic with Dr. Ammon in 1-2 weeks.    This patient's plan of care was discussed and created with Dr. Ammon and he is in agreement.  Signed: Danita Bloch, PA-C  06/15/2024, 9:37 AM [facility]

## 2024-06-16 ENCOUNTER — Other Ambulatory Visit: Payer: Self-pay

## 2024-06-16 ENCOUNTER — Encounter: Payer: Self-pay | Admitting: Oncology

## 2024-06-16 LAB — BASIC METABOLIC PANEL WITH GFR
Anion gap: 12 (ref 5–15)
BUN: 28 mg/dL — ABNORMAL HIGH (ref 8–23)
CO2: 24 mmol/L (ref 22–32)
Calcium: 8.9 mg/dL (ref 8.9–10.3)
Chloride: 95 mmol/L — ABNORMAL LOW (ref 98–111)
Creatinine, Ser: 0.89 mg/dL (ref 0.44–1.00)
GFR, Estimated: >60 mL/min (ref >60)
Glucose, Bld: 240 mg/dL — ABNORMAL HIGH (ref 70–99)
Potassium: 4.4 mmol/L (ref 3.5–5.1)
Sodium: 131 mmol/L — ABNORMAL LOW (ref 135–145)

## 2024-06-16 LAB — GLUCOSE, CAPILLARY
Glucose-Capillary: 225 mg/dL — ABNORMAL HIGH (ref 70–99)
Glucose-Capillary: 221 mg/dL — ABNORMAL HIGH (ref 70–99)

## 2024-06-16 MED ORDER — SODIUM CHLORIDE 0.9 % IV SOLN
500.0000 mg | Freq: Every day | INTRAVENOUS | Status: AC
  Administered 2024-06-16: 500 mg via INTRAVENOUS
  Filled 2024-06-16: qty 500

## 2024-06-16 MED ORDER — FUROSEMIDE 40 MG PO TABS
40.0000 mg | ORAL_TABLET | Freq: Every day | ORAL | 0 refills | Status: AC
  Filled 2024-06-16: qty 30, 30d supply, fill #0

## 2024-06-16 MED ORDER — PREDNISONE 5 MG PO TABS
ORAL_TABLET | ORAL | 0 refills | Status: AC
  Filled 2024-06-16: qty 55, 10d supply, fill #0

## 2024-06-16 MED ORDER — LOSARTAN POTASSIUM 25 MG PO TABS
25.0000 mg | ORAL_TABLET | Freq: Every day | ORAL | 0 refills | Status: AC
  Filled 2024-06-16: qty 30, 30d supply, fill #0

## 2024-06-16 MED ORDER — AZITHROMYCIN 250 MG PO TABS
250.0000 mg | ORAL_TABLET | Freq: Every day | ORAL | 0 refills | Status: AC
  Filled 2024-06-16: qty 1, 1d supply, fill #0

## 2024-06-16 NOTE — Progress Notes (Signed)
 [facility] CARDIOLOGY PROGRESS NOTE       Patient ID: Michelle Hawkins MRN: [id number] DOB/AGE: [birth year] 78 y.o.  Admit date: 06/10/2024 Referring Physician Dr. Delayne Solian Primary Physician Sherial Bail, MD  Primary Cardiologist Dr. Ammon Reason for Consultation trigeminy, chest pain  HPI: Michelle Hawkins is a 78 y.o. female  with a past medical history of palpitations, COPD, hypertension, hyperlipidemia, nonobstructive coronary disease by CTA 10/2023 who presented to the ED on 06/10/2024 for chest pain. Cardiology was consulted for further evaluation.   Interval history: - Patient seen and examined this morning, resting comfortably in bed with family at bedside. - States that she is feeling ok, SOB stable.  - Denies any recurrence of chest pain. BP and HR stable.   Review of systems complete and found to be negative unless listed above    Past Medical History:  Diagnosis Date   Allergic rhinitis    Anxiety    Breast cancer (HCC) 1996   s/p left breast lumpectomy (lymph node dissection - 2/11 positive), chemotherapy   Breast cancer (HCC) 1995   right breast lumpectomy   Complication of anesthesia    slow to wake   Depression    Diabetes mellitus    Dysphagia    Family history of adverse reaction to anesthesia    Mother - slow to wake   GERD (gastroesophageal reflux disease)    H/O ulcer disease    PUD   Hypercholesterolemia    Hypertension    Hypothyroidism    multinodular goiter   Nephrolithiasis    Personal history of chemotherapy    Personal history of radiation therapy    Scoliosis    Skin cancer    SVT (supraventricular tachycardia)     Past Surgical History:  Procedure Laterality Date   ABDOMINAL HYSTERECTOMY     partial   BREAST BIOPSY Left 09/2012   benign   BREAST EXCISIONAL BIOPSY Right    1995 benign   BREAST LUMPECTOMY Left 1996   lumpectomy with chemo and rad tx for breast ca (lymph node dissection - 2/11 positive   BREAST  LUMPECTOMY WITH AXILLARY LYMPH NODE DISSECTION  1996   left   CATARACT EXTRACTION W/PHACO Left 11/29/2020   Procedure: CATARACT EXTRACTION PHACO AND INTRAOCULAR LENS PLACEMENT (IOC) LEFT DIABETIC VIVITY TORIC;  Surgeon: Jaye Fallow, MD;  Location: [facility];  Service: Ophthalmology;  Laterality: Left;  Diabetic - oral meds 6.88 00:40.7   CATARACT EXTRACTION W/PHACO Right 12/13/2020   Procedure: CATARACT EXTRACTION PHACO AND INTRAOCULAR LENS PLACEMENT (IOC) RIGHT DIABETIC VIVITY TORIC 5.44 00:40.3;  Surgeon: Jaye Fallow, MD;  Location: [facility];  Service: Ophthalmology;  Laterality: Right;  Diabetic - oral meds   COLONOSCOPY WITH PROPOFOL  N/A 06/03/2019   Procedure: COLONOSCOPY WITH PROPOFOL ;  Surgeon: Dessa Reyes LELON, MD;  Location: [facility] ENDOSCOPY;  Service: Endoscopy;  Laterality: N/A;   ESOPHAGOGASTRODUODENOSCOPY N/A 06/03/2019   Procedure: ESOPHAGOGASTRODUODENOSCOPY (EGD);  Surgeon: Dessa Reyes LELON, MD;  Location: [facility] ENDOSCOPY;  Service: Endoscopy;  Laterality: N/A;   LUMBAR LAMINECTOMY  09/2005   L5-S1   RIGHT/LEFT HEART CATH AND CORONARY ANGIOGRAPHY N/A 06/15/2024   Procedure: RIGHT/LEFT HEART CATH AND CORONARY ANGIOGRAPHY;  Surgeon: Ammon Blunt, MD;  Location: [facility] INVASIVE CV LAB;  Service: Cardiovascular;  Laterality: N/A;    Medications Prior to Admission  Medication Sig Dispense Refill Last Dose/Taking   acetaminophen  (TYLENOL ) 500 MG tablet Take 500 mg by mouth every 6 (six) hours as needed for mild  pain (pain score 1-3).   Taking As Needed   albuterol  (VENTOLIN  HFA) 108 (90 Base) MCG/ACT inhaler Inhale 1-2 puffs into the lungs every 4 (four) hours as needed.   Taking As Needed   aspirin  81 MG tablet Take 162 mg by mouth every evening.   Taking   atorvastatin  (LIPITOR) 20 MG tablet TAKE ONE TABLET EVERY DAY 90 tablet 2 Taking   Biotin 5 MG CAPS Take 5 mg by mouth daily.   Taking   calcium  citrate (CALCITRATE - DOSED IN MG ELEMENTAL  CALCIUM ) 950 MG tablet Take 200 mg of elemental calcium  by mouth daily.   Taking   cyclobenzaprine (FLEXERIL) 10 MG tablet Take 10 mg by mouth 2 (two) times daily as needed.   Unknown   escitalopram  (LEXAPRO ) 20 MG tablet TAKE ONE TABLET EVERY DAY 90 tablet 1 Taking   fish oil-omega-3 fatty acids 1000 MG capsule Take 2 g by mouth daily.   Taking   fluticasone -salmeterol (ADVAIR) 250-50 MCG/ACT AEPB Inhale 1 puff into the lungs 2 (two) times daily.   Taking   levothyroxine  (SYNTHROID ) 25 MCG tablet TAKE 1 TABLET EVERY DAY ON EMPTY STOMACHWITH A GLASS OF WATER  AT LEAST 30-60 MINBEFORE BREAKFAST 90 tablet 1 Taking   losartan  (COZAAR ) 100 MG tablet TAKE 1 TABLET BY MOUTH DAILY 90 tablet 1 Taking   metFORMIN  (GLUCOPHAGE -XR) 500 MG 24 hr tablet TAKE TWO TABLETS TWICE DAILY (Patient taking differently: Take 500-1,000 mg by mouth 2 (two) times daily. 500mg  qam and 1,000mg  qpm) 360 tablet 3 Taking Differently   metoprolol  succinate (TOPROL -XL) 50 MG 24 hr tablet TAKE 1 TABLET BY MOUTH DAILY WITH OR FOLLOWING A MEAL 90 tablet 1 Taking   TURMERIC PO Take by mouth daily.    Taking   Accu-Chek Softclix Lancets lancets Check blood sugar twice daily Dx 250.00 200 each 1    Blood Glucose Monitoring Suppl (ONE TOUCH ULTRA SYSTEM KIT) w/Device KIT 1 kit by Does not apply route once.      cholecalciferol (VITAMIN D) 400 UNITS TABS Take by mouth. (Patient not taking: Reported on 06/11/2024)   Not Taking   Continuous Blood Gluc Receiver (FREESTYLE LIBRE 14 DAY READER) DEVI Inject 1 Device into the skin every 14 (fourteen) days. Place 1 sensor every 14 days. Use to check sugar at least 4 times daily 6 each 3    Continuous Blood Gluc Sensor (FREESTYLE LIBRE 14 DAY SENSOR) MISC Inject 1 Device into the skin every 14 (fourteen) days. 6 each 3    ferrous sulfate 325 (65 FE) MG EC tablet Take 325 mg by mouth daily with breakfast. (Patient not taking: Reported on 06/11/2024)   Not Taking   glucose blood (ONE TOUCH ULTRA TEST)  test strip CHECK BLOOD SUGAR UP TO THREE TIMES DAILY Dx E11.9 300 each 3    isosorbide  mononitrate (IMDUR ) 30 MG 24 hr tablet Take 1 tablet (30 mg total) by mouth daily. (Patient not taking: Reported on 06/11/2024) 30 tablet 2 Not Taking   Multiple Vitamin (MULTIVITAMIN WITH MINERALS) TABS tablet Take 1 tablet by mouth daily. (Patient not taking: Reported on 06/11/2024)   Not Taking   omeprazole  (PRILOSEC) 40 MG capsule TAKE 1 CAPSULE EVERY DAY (Patient not taking: Reported on 06/11/2024) 90 capsule 1 Not Taking   Probiotic Product (PROBIOTIC DAILY PO) Take 1 tablet by mouth. (Patient not taking: Reported on 06/11/2024)   Not Taking   Social History   Socioeconomic History   Marital status: Widowed  Spouse name: Not on file   Number of children: Not on file   Years of education: Not on file   Highest education level: Not on file  Occupational History   Not on file  Tobacco Use   Smoking status: Former    Current packs/day: 0.00    Average packs/day: 0.5 packs/day for 40.0 years (20.0 ttl pk-yrs)    Types: Cigarettes    Start date: 04/14/1964    Quit date: 04/14/2004    Years since quitting: 20.1   Smokeless tobacco: Never  Vaping Use   Vaping status: Never Used  Substance and Sexual Activity   Alcohol use: No    Alcohol/week: 0.0 standard drinks of alcohol   Drug use: No   Sexual activity: Never  Other Topics Concern   Not on file  Social History Narrative   Not on file   Social Drivers of Health   Financial Resource Strain: Low Risk  (11/21/2023)   Received from [facility]   Overall Financial Resource Strain (CARDIA)    Difficulty of Paying Living Expenses: Not hard at all  Food Insecurity: No Food Insecurity (06/12/2024)   Hunger Vital Sign    Worried About Running Out of Food in the Last Year: Never true    Ran Out of Food in the Last Year: Never true  Transportation Needs: No Transportation Needs (06/12/2024)   PRAPARE - Scientist, Research (physical Sciences) (Medical): No    Lack of Transportation (Non-Medical): No  Physical Activity: Not on file  Stress: Not on file  Social Connections: Moderately Integrated (06/12/2024)   Social Connection and Isolation Panel    Frequency of Communication with Friends and Family: More than three times a week    Frequency of Social Gatherings with Friends and Family: More than three times a week    Attends Religious Services: More than 4 times per year    Active Member of [employer] or Organizations: Yes    Attends Banker Meetings: More than 4 times per year    Marital Status: Widowed  Intimate Partner Violence: Not At Risk (06/12/2024)   Humiliation, Afraid, Rape, and Kick questionnaire    Fear of Current or Ex-Partner: No    Emotionally Abused: No    Physically Abused: No    Sexually Abused: No    Family History  Problem Relation Age of Onset   Alzheimer's disease Mother    Heart disease Father    Hypertension Father    Diabetes Father    Diabetes Sister    Diabetes Sister    Diabetes Sister    Diabetes Sister    Breast cancer Neg Hx      Vitals:   06/15/24 2258 06/16/24 0500 06/16/24 0520 06/16/24 0829  BP: (!) 147/78  126/74 (!) 142/85  Pulse: (!) 54  (!) 52 (!) 55  Resp: 17  16 17   Temp: 98 F (36.7 C)  98 F (36.7 C) 98.2 F (36.8 C)  TempSrc:    Oral  SpO2: 100%  95% 97%  Weight:  59.7 kg    Height:        PHYSICAL EXAM General: Chronically ill-appearing elderly female, well nourished, in no acute distress. HEENT: Normocephalic and atraumatic. Neck: No JVD.  Lungs: Normal respiratory effort on 2L [place]. Clear bilaterally to auscultation. No wheezes, crackles, rhonchi.  Heart: HRRR. Normal S1 and S2 without gallops or murmurs.  Abdomen: Non-distended appearing.  Msk: Normal strength and tone  for age. Extremities: Warm and well perfused. No clubbing, cyanosis.  No edema.  Neuro: Alert and oriented X 3. Psych: Answers questions appropriately.    Labs: Basic Metabolic Panel: Recent Labs    06/15/24 0333 06/15/24 0813 06/15/24 0828 06/16/24 0341  NA 133*   < > 137 131*  K 4.6   < > 4.3 4.4  CL 96*  --   --  95*  CO2 24  --   --  24  GLUCOSE 278*  --   --  240*  BUN 29*  --   --  28*  CREATININE 0.96  --   --  0.89  CALCIUM  8.8*  --   --  8.9   < > = values in this interval not displayed.   Liver Function Tests: No results for input(s): AST, ALT, ALKPHOS, BILITOT, PROT, ALBUMIN  in the last 72 hours.  No results for input(s): LIPASE, AMYLASE in the last 72 hours. CBC: Recent Labs    06/15/24 0333 06/15/24 0813 06/15/24 0828  WBC 14.1*  --   --   HGB 13.8 14.6 13.9  HCT 41.6 43.0 41.0  MCV 86.1  --   --   PLT 173  --   --    Cardiac Enzymes: No results for input(s): CKTOTAL, CKMB, CKMBINDEX, TROPONINIHS in the last 72 hours. BNP: No results for input(s): BNP in the last 72 hours. D-Dimer: No results for input(s): DDIMER in the last 72 hours. Hemoglobin A1C: No results for input(s): HGBA1C in the last 72 hours.  Fasting Lipid Panel: No results for input(s): CHOL, HDL, LDLCALC, TRIG, CHOLHDL, LDLDIRECT in the last 72 hours. Thyroid  Function Tests: No results for input(s): TSH, T4TOTAL, T3FREE, THYROIDAB in the last 72 hours.  Invalid input(s): FREET3 Anemia Panel: No results for input(s): VITAMINB12, FOLATE, FERRITIN, TIBC, IRON , RETICCTPCT in the last 72 hours.   Radiology: CARDIAC CATHETERIZATION Result Date: 06/15/2024   Prox RCA lesion is 20% stenosed.   The left ventricular systolic function is normal.   LV end diastolic pressure is normal.   The left ventricular ejection fraction is 55-65% by visual estimate.   Hemodynamic findings consistent with moderate pulmonary hypertension.   There is mild (2+) mitral regurgitation. 1.  Mild to moderate pulmonary hypertension, PA 59/23  (36) 2.  Near normal coronary anatomy 3.  Normal left  ventricular function,  PW 23/13  (19) 4.  Mild mitral regurgitation Recommendations 1.  Agree with overall current therapy 2.  Diuresis as needed 3.  Continue good medical management 4.  Follow-up with Dr.  Aleskerov 5.  Follow-up with me 1 to 2 weeks 6.  May discharge home later today   ECHOCARDIOGRAM COMPLETE Result Date: 06/11/2024    ECHOCARDIOGRAM REPORT   Patient Name:   Michelle Hawkins Date of Exam: 06/11/2024 Medical Rec #:  [id number]    Height:       64.0 in Accession #:    [id number]   Weight:       132.0 lb Date of Birth:  12/21/1945    BSA:          1.640 m Patient Age:    78 years     BP:           122/93 mmHg Patient Gender: F            HR:           50 bpm. Exam Location:  [facility] Procedure: 2D Echo, Color Doppler, Cardiac Doppler  and Intracardiac            Opacification Agent (Both Spectral and Color Flow Doppler were            utilized during procedure). Indications:     CHF-Acute Diastolic I50.31  History:         Patient has prior history of Echocardiogram examinations, most                  recent 10/27/2023. CHF.  Sonographer:     Ashley McNeely-Sloane Referring Phys:  [id number] DELAYNE LULLA SOLIAN Diagnosing Phys: Cara JONETTA Lovelace MD IMPRESSIONS  1. Left ventricular ejection fraction, by estimation, is 60 to 65%. The left ventricle has normal function. The left ventricle has no regional wall motion abnormalities. Left ventricular diastolic parameters are consistent with Grade I diastolic dysfunction (impaired relaxation). There is the interventricular septum is flattened in diastole ('D' shaped left ventricle), consistent with right ventricular volume overload.  2. Right ventricular systolic function is mildly reduced. The right ventricular size is moderately enlarged.  3. The mitral valve is grossly normal. Moderate mitral valve regurgitation.  4. The aortic valve is normal in structure. Aortic valve regurgitation is not visualized. FINDINGS  Left Ventricle: Left ventricular ejection fraction, by  estimation, is 60 to 65%. The left ventricle has normal function. The left ventricle has no regional wall motion abnormalities. Definity  contrast agent was given IV to delineate the left ventricular  endocardial borders. Strain was performed and the global longitudinal strain is indeterminate. The left ventricular internal cavity size was normal in size. There is no left ventricular hypertrophy. The interventricular septum is flattened in diastole ('D' shaped left ventricle), consistent with right ventricular volume overload. Left ventricular diastolic parameters are consistent with Grade I diastolic dysfunction (impaired relaxation). Right Ventricle: The right ventricular size is moderately enlarged. No increase in right ventricular wall thickness. Right ventricular systolic function is mildly reduced. Left Atrium: Left atrial size was normal in size. Right Atrium: Right atrial size was normal in size. Pericardium: There is no evidence of pericardial effusion. Mitral Valve: The mitral valve is grossly normal. Moderate mitral valve regurgitation. MV peak gradient, 4.2 mmHg. The mean mitral valve gradient is 1.0 mmHg. Tricuspid Valve: The tricuspid valve is normal in structure. Tricuspid valve regurgitation is mild. Aortic Valve: The aortic valve is normal in structure. Aortic valve regurgitation is not visualized. Aortic valve mean gradient measures 2.0 mmHg. Aortic valve peak gradient measures 4.5 mmHg. Aortic valve area, by VTI measures 1.91 cm. Pulmonic Valve: The pulmonic valve was normal in structure. Pulmonic valve regurgitation is not visualized. Aorta: The ascending aorta was not well visualized. IAS/Shunts: No atrial level shunt detected by color flow Doppler. Additional Comments: 3D was performed not requiring image post processing on an independent workstation and was indeterminate.  LEFT VENTRICLE PLAX 2D LVIDd:         3.90 cm     Diastology LVIDs:         2.00 cm     LV e' medial:    4.79 cm/s LV PW:          1.00 cm     LV E/e' medial:  11.8 LV IVS:        1.00 cm     LV e' lateral:   5.55 cm/s LVOT diam:     1.70 cm     LV E/e' lateral: 10.2 LV SV:         50 LV SV Index:  30 LVOT Area:     2.27 cm  LV Volumes (MOD) LV vol d, MOD A2C: 45.7 ml LV vol d, MOD A4C: 49.6 ml LV vol s, MOD A2C: 15.7 ml LV vol s, MOD A4C: 16.7 ml LV SV MOD A2C:     30.0 ml LV SV MOD A4C:     49.6 ml LV SV MOD BP:      31.3 ml RIGHT VENTRICLE            IVC RV Basal diam:  4.10 cm    IVC diam: 2.70 cm RV Mid diam:    3.70 cm RV S prime:     9.57 cm/s TAPSE (M-mode): 2.2 cm LEFT ATRIUM             Index        RIGHT ATRIUM           Index LA diam:        2.70 cm 1.65 cm/m   RA Area:     18.70 cm LA Vol (A2C):   26.2 ml 15.98 ml/m  RA Volume:   54.80 ml  33.42 ml/m LA Vol (A4C):   54.5 ml 33.24 ml/m LA Biplane Vol: 38.3 ml 23.36 ml/m  AORTIC VALVE                    PULMONIC VALVE AV Area (Vmax):    2.23 cm     PV Vmax:          0.78 m/s AV Area (Vmean):   2.11 cm     PV Vmean:         52.700 cm/s AV Area (VTI):     1.91 cm     PV VTI:           0.184 m AV Vmax:           106.00 cm/s  PV Peak grad:     2.4 mmHg AV Vmean:          68.800 cm/s  PV Mean grad:     1.0 mmHg AV VTI:            0.261 m      PR End Diast Vel: 2.65 msec AV Peak Grad:      4.5 mmHg     RVOT Peak grad:   1 mmHg AV Mean Grad:      2.0 mmHg LVOT Vmax:         104.00 cm/s LVOT Vmean:        63.900 cm/s LVOT VTI:          0.220 m LVOT/AV VTI ratio: 0.84  AORTA Ao Root diam: 2.90 cm Ao Asc diam:  3.10 cm MITRAL VALVE               TRICUSPID VALVE MV Area (PHT): 2.54 cm    TR Peak grad:   63.7 mmHg MV Area VTI:   1.50 cm    TR Mean grad:   27.0 mmHg MV Peak grad:  4.2 mmHg    TR Vmax:        399.00 cm/s MV Mean grad:  1.0 mmHg    TR Vmean:       251.0 cm/s MV Vmax:       1.02 m/s MV Vmean:      47.1 cm/s   SHUNTS MV Decel Time: 299 msec    Systemic VTI:  0.22 m MV E velocity: 56.60 cm/s  Systemic  Diam: 1.70 cm MV A velocity: 83.30 cm/s  Pulmonic VTI:  0.114 m  MV E/A ratio:  0.68 Michelle D Callwood MD Electronically signed by Cara JONETTA Lovelace MD Signature Date/Time: 06/11/2024/8:17:45 PM    Final    CT Angio Chest Pulmonary Embolism (PE) W or WO Contrast Result Date: 06/11/2024 EXAM: CTA of the Chest with contrast for PE 06/11/2024 01:13:52 AM TECHNIQUE: CTA of the chest was performed after the administration of 75 mL of iohexol  (OMNIPAQUE ) 350 MG/ML injection. Multiplanar reformatted images are provided for review. MIP images are provided for review. Automated exposure control, iterative reconstruction, and/or weight based adjustment of the mA/kV was utilized to reduce the radiation dose to as low as reasonably achievable. COMPARISON: Prior examination 12/2023 and more remote prior examination of 10/26/2023. CLINICAL HISTORY: Pulmonary embolism (PE) suspected, high prob. Breast cancer. *tracking code: Bo* FINDINGS: PULMONARY ARTERIES: Pulmonary arteries are adequately opacified for evaluation. No pulmonary embolism. The central pulmonary arteries are enlarged in keeping with changes of pulmonary arterial hypertension. Main pulmonary artery is normal in caliber. MEDIASTINUM: Mild coronary artery calcification. Mild cardiomegaly with asymmetric enlargement of the right heart chambers in keeping with elevated right heart pressure. No pericardial effusion. Mild atherosclerotic calcifications within the thoracic aorta. No aortic aneurysm. LYMPH NODES: Pathologic mediastinal and bilateral hilar adenopathy is present with the index lymph node measuring 17 mm in short axis diameter within the subcarinal lymph node group and 18 mm in diameter within the right hilar lymph node groove (54/4). These appear stable since the prior examination 12/2023 and more remote prior examination of 10/26/2023 possible reflecting the sequela of treated disease, and inflammatory processes such as sarcoidosis, or a low-grade lymphoproliferative process. LUNGS AND PLEURA: Mild diffuse ground-glass  pulmonary opacity is again seen demonstrating a basilar predominance, progressive since prior examination and most suggestive of mild alveolar pulmonary edema. No pneumothorax or pleural effusion. UPPER ABDOMEN: Limited images of the upper abdomen are unremarkable. SOFT TISSUES AND BONES: No acute bone or soft tissue abnormality. IMPRESSION: 1. No pulmonary embolism. 2. Cardiomegaly with asymmetric enlargement of the right heart chambers been with elevated right heart pressure. 3. Morphologic changes in keeping with pulmonary arterial hypertension. 4. Mild diffuse ground-glass pulmonary opacity with basilar predominance, progressive since prior examination, most suggestive of mild alveolar pulmonary edema. 5. Pathologic mediastinal and bilateral hilar adenopathy, stable since prior examination, possibly reflecting sequela of treated disease, inflammatory processes such as sarcoidosis, or a low-grade lymphoproliferative process. Electronically signed by: Dorethia Molt MD 06/11/2024 01:25 AM EST RP Workstation: [id number]   DG Chest 2 View Result Date: 06/11/2024 EXAM: 2 VIEW(S) XRAY OF THE CHEST 06/10/2024 11:56:00 PM COMPARISON: 05/26/2024 CLINICAL HISTORY: cough, chest pain FINDINGS: LUNGS AND PLEURA: No focal pulmonary opacity. No pleural effusion. No pneumothorax. HEART AND MEDIASTINUM: No acute abnormality of the cardiac and mediastinal silhouettes. BONES AND SOFT TISSUES: Left axillary surgical clips noted. Thoracolumbar scoliosis stable. No acute osseous abnormality. IMPRESSION: 1. No acute cardiopulmonary process. Electronically signed by: Norman Gatlin MD 06/11/2024 12:05 AM EST RP Workstation: [id number]   NM Pulmonary Perfusion Result Date: 05/26/2024 CLINICAL DATA:  Worsening shortness of breath for 6 months. Elevated D-dimer levels. EXAM: NUCLEAR MEDICINE PERFUSION LUNG SCAN TECHNIQUE: Perfusion images were obtained in multiple projections after intravenous injection of radiopharmaceutical. No  ventilation imaging performed. RADIOPHARMACEUTICALS:  3.86 mCi Tc-65m MAA IV COMPARISON:  Chest radiographs 05/26/2024, noncontrast chest CT 05/14/2024 and chest CTA 10/26/2023 FINDINGS: There is patchy decreased perfusion in both upper lobes, most likely secondary to  the emphysema demonstrated on the patient's CT. There are no wedge-shaped perfusion defects to suggest acute pulmonary embolism. IMPRESSION: 1. No evidence of acute pulmonary embolism on perfusion scintigraphy by PISAPED criteria. 2. Decreased perfusion at both lung apices, likely due to emphysema. Electronically Signed   By: Elsie Perone M.D.   On: 05/26/2024 13:50   DG Chest 2 View Result Date: 05/26/2024 CLINICAL DATA:  Shortness of breath. EXAM: CHEST - 2 VIEW COMPARISON:  Chest radiograph dated 10/26/2023. FINDINGS: No focal consolidation, pleural effusion or pneumothorax. The cardiac silhouette is within normal limits. No acute osseous pathology. Degenerative changes of the spine. Left axillary surgical clips. IMPRESSION: No active cardiopulmonary disease. Electronically Signed   By: Vanetta Chou M.D.   On: 05/26/2024 13:02    ECHO as above  TELEMETRY (personally reviewed): sinus rhythm rate 50s  EKG (personally reviewed): Sinus bradycardia rate 57 bpm  Data reviewed by me 06/16/2024: last 24h vitals tele labs imaging I/O ED provider note, admission H&P, hospitalist progress note, pulmonology notes  Principal Problem:   Chest pain Active Problems:   Diabetes mellitus (HCC)   History of breast cancer s/p chemoradiation   Possible new onset CHF (congestive heart failure) (HCC)   COPD (chronic obstructive pulmonary disease) (HCC)   Thrombocytopenia, new   Mediastinal lymphadenopathy   Ventricular trigeminy    ASSESSMENT AND PLAN:  MYLIE MCCURLEY is a 78 y.o. female  with a past medical history of palpitations, COPD, hypertension, hyperlipidemia, nonobstructive coronary disease by CTA 10/2023 who presented to the ED on  06/10/2024 for chest pain. Cardiology was consulted for further evaluation.   # Noncardiac chest pain # Occasional PVCs # COPD Patient presented after episode of chest discomfort last night which started while watching TV, troponin is normal x 2.  EKG without acute ischemic changes.  Had CTA coronary earlier this year which revealed nonobstructive coronary artery disease.  Episode of trigeminy overnight last night, now with occasional PVCs noted on telemetry.  She has known history of this.  Echo this admission with EF 60 to 65%, no wall motion abnormalities, grade 1 diastolic dysfunction, intraventricular septum flattening in diastole, mildly reduced RV function with moderate enlargement, moderate MR.  CTA chest on admission was negative for PE. R+LHC this AM with mild to moderate pulmonary hypertension, no significant obstructive CAD.  - Continue losartan  25 mg daily, metoprolol  succinate 12.5 mg daily, Imdur  30 mg daily.  - Continue PO lasix  40 mg daily. Consider addition of MRA, SLGT2i outpatient. - Continue atorvastatin  20 mg daily and aspirin  81 mg daily.  Ok for discharge today from a cardiac perspective. Will arrange for follow up in clinic with Dr. Ammon in 1-2 weeks.    This patient's plan of care was discussed and created with Dr. Ammon and he is in agreement.  Signed: Danita Bloch, PA-C  06/16/2024, 9:21 AM [facility]

## 2024-06-16 NOTE — Plan of Care (Signed)

## 2024-06-16 NOTE — Progress Notes (Signed)
 PULMONOLOGY         Date: 06/16/2024,   MRN# [id number] Michelle Hawkins December 13, 1945     AdmissionWeight: 59.9 kg                 CurrentWeight: 59.7 kg  Referring provider: Dr Sonjia   CHIEF COMPLAINT:   Progressive dyspnea on exertion with chest pain   HISTORY OF PRESENT ILLNESS   This is a 78 year old female with a history of diabetes essential hypertension COPD was a previous smoker, history of breast cancer status post chemo and radiation therapy who was seen by me previously in pulmonology clinic for an outpatient.  She is presented to the emergency department with chest discomfort and dyspnea.  She had a CT chest in the past most recently May 14, 2024 which showed peripheral basilar predominant reticular and septal thickening with associated traction bronchiectasis minimal honeycombing.  There was no pleural effusion, pneumothorax, suspicious appearing nodules, residual scarring from left lumpectomy and axillary lymph node dissection.  Overall findings were suggestive of possible interstitial lung disease.  She also had a VQ scan performed November 18 which was negative for any acute pulmonary embolism or perfusion defect.  She did have matched decreased perfusion at both lung apices likely due to underlying emphysema.  On this admission she had a repeat CT with PE protocol with similar findings as before including mild cardiomegaly and asymmetric RV enlargement, mild hilar and mediastinal adenopathy which appears stable from June 2025 as well as diffuse ground glass opacification suggestive of alveolar pulmonary edema.  Her proBNP was over 3000 on this admission suggestive of cardiac etiology of interstitial pulmonary edema.  Her CMP is essentially unremarkable besides a mild decrement in GFR at 57.  Viral testing for flu, RSV and COVID-19 are negative.  PCCM consultation for further evaluation management.   06/12/24- patient reports minimal improvement clinically. She has  elevated CRP which seems to be due to ILD exacerbation. She will need 1000mg  solumedrol x 3 days Then taper.  06/13/24-  patient appears improved today.  She had first dose of pulsed 1g solumedrol this am at 10am.  Her sugars are only slightly higher and Im rechecking them again this afternoon.  We plan to walk today for chest physiotherapy.  She feels well enough to shower and do her hair and is smiling during interview. We discussed her pulmonary hypertension and have contacted cardiology who has seen patient and may be able to perform cardiac cath while she is inpatient.    06/14/24- patient feels no better.  She reports headache.  I have reduced steroids to 500mg  and have increased her lasix  dose.  Will see her again in am.  She should have cardiac cath coming up soon and may be ok for dc home after that.   06/15/24- I met with family at bedside.  We discussed upcoming cardiac cath and current findings of ILD with peripheral based scarring. She does not feel much improvemement clinically.  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  She will need chronic diuresis and cardiac optimization.  Patietn for dc home today .   PAST MEDICAL HISTORY   Past Medical History:  Diagnosis Date   Allergic rhinitis    Anxiety    Breast cancer (HCC) 1996   s/p  left breast lumpectomy (lymph node dissection - 2/11 positive), chemotherapy   Breast cancer (HCC) 1995   right breast lumpectomy   Complication of anesthesia    slow to wake   Depression    Diabetes mellitus    Dysphagia    Family history of adverse reaction to anesthesia    Mother - slow to wake   GERD (gastroesophageal reflux disease)    H/O ulcer disease    PUD   Hypercholesterolemia    Hypertension    Hypothyroidism    multinodular goiter   Nephrolithiasis     Personal history of chemotherapy    Personal history of radiation therapy    Scoliosis    Skin cancer    SVT (supraventricular tachycardia)      SURGICAL HISTORY   Past Surgical History:  Procedure Laterality Date   ABDOMINAL HYSTERECTOMY     partial   BREAST BIOPSY Left 09/2012   benign   BREAST EXCISIONAL BIOPSY Right    1995 benign   BREAST LUMPECTOMY Left 1996   lumpectomy with chemo and rad tx for breast ca (lymph node dissection - 2/11 positive   BREAST LUMPECTOMY WITH AXILLARY LYMPH NODE DISSECTION  1996   left   CATARACT EXTRACTION W/PHACO Left 11/29/2020   Procedure: CATARACT EXTRACTION PHACO AND INTRAOCULAR LENS PLACEMENT (IOC) LEFT DIABETIC VIVITY TORIC;  Surgeon: Jaye Fallow, MD;  Location: [facility];  Service: Ophthalmology;  Laterality: Left;  Diabetic - oral meds 6.88 00:40.7   CATARACT EXTRACTION W/PHACO Right 12/13/2020   Procedure: CATARACT EXTRACTION PHACO AND INTRAOCULAR LENS PLACEMENT (IOC) RIGHT DIABETIC VIVITY TORIC 5.44 00:40.3;  Surgeon: Jaye Fallow, MD;  Location: [facility];  Service: Ophthalmology;  Laterality: Right;  Diabetic - oral meds   COLONOSCOPY WITH PROPOFOL  N/A 06/03/2019   Procedure: COLONOSCOPY WITH PROPOFOL ;  Surgeon: Dessa Reyes ORN, MD;  Location: [facility];  Service: Endoscopy;  Laterality: N/A;   ESOPHAGOGASTRODUODENOSCOPY N/A 06/03/2019   Procedure: ESOPHAGOGASTRODUODENOSCOPY (EGD);  Surgeon: Dessa Reyes ORN, MD;  Location: [facility];  Service: Endoscopy;  Laterality: N/A;   LUMBAR LAMINECTOMY  09/2005   L5-S1   RIGHT/LEFT HEART CATH AND CORONARY ANGIOGRAPHY N/A 06/15/2024   Procedure: RIGHT/LEFT HEART CATH AND CORONARY ANGIOGRAPHY;  Surgeon: Ammon Blunt, MD;  Location: [facility];  Service: Cardiovascular;  Laterality: N/A;     FAMILY HISTORY   Family History  Problem Relation Age of Onset   Alzheimer's disease Mother    Heart disease Father    Hypertension  Father    Diabetes Father    Diabetes Sister    Diabetes Sister    Diabetes Sister    Diabetes Sister    Breast cancer Neg Hx      SOCIAL HISTORY   Social History   Tobacco Use   Smoking status: Former    Current packs/day: 0.00    Average packs/day: 0.5 packs/day for 40.0 years (20.0 ttl pk-yrs)    Types: Cigarettes    Start date: 04/14/1964    Quit date: 04/14/2004    Years since quitting: 20.1   Smokeless tobacco: Never  Vaping Use   Vaping status: Never Used  Substance Use Topics   Alcohol use: No    Alcohol/week: 0.0 standard drinks of alcohol   Drug use: No     MEDICATIONS      Current Medication:  Current Facility-Administered Medications:    0.9 %  sodium chloride  infusion, 250 mL, Intravenous, PRN, Paraschos, Alexander, MD   [DISCONTINUED]  acetaminophen  (TYLENOL ) tablet 650 mg, 650 mg, Oral, Q6H PRN, 650 mg at 06/14/24 2050 **OR** acetaminophen  (TYLENOL ) suppository 650 mg, 650 mg, Rectal, Q6H PRN, Cleatus Delayne GAILS, MD   acetaminophen  (TYLENOL ) tablet 650 mg, 650 mg, Oral, Q4H PRN, Paraschos, Alexander, MD   albuterol  (PROVENTIL ) (2.5 MG/3ML) 0.083% nebulizer solution 2.5 mg, 2.5 mg, Nebulization, Q2H PRN, Cleatus Delayne GAILS, MD   alum & mag hydroxide-simeth (MAALOX/MYLANTA) 200-200-20 MG/5ML suspension 30 mL, 30 mL, Oral, Q6H PRN, Trudy Anthony HERO, MD, 30 mL at 06/14/24 2050   atorvastatin  (LIPITOR) tablet 20 mg, 20 mg, Oral, Daily, Cleatus Delayne V, MD, 20 mg at 06/15/24 1549   azithromycin  (ZITHROMAX ) tablet 250 mg, 250 mg, Oral, Daily, Baya Lentz, MD, 250 mg at 06/15/24 1550   enoxaparin  (LOVENOX ) injection 40 mg, 40 mg, Subcutaneous, Q24H, Patel, Sona, MD, 40 mg at 06/15/24 2123   free water  500 mL, 500 mL, Oral, Once, Callwood, Dwayne D, MD   free water  500 mL, 500 mL, Oral, Once, Paraschos, Alexander, MD   furosemide  (LASIX ) tablet 40 mg, 40 mg, Oral, Daily, Hudson, Caralyn, PA-C, 40 mg at 06/15/24 1152   HYDROcodone -acetaminophen  (NORCO/VICODIN)  5-325 MG per tablet 1-2 tablet, 1-2 tablet, Oral, Q4H PRN, Cleatus Delayne GAILS, MD   insulin  aspart (novoLOG ) injection 0-20 Units, 0-20 Units, Subcutaneous, TID WC, Cleatus Delayne GAILS, MD, 11 Units at 06/15/24 1809   insulin  aspart (novoLOG ) injection 0-5 Units, 0-5 Units, Subcutaneous, QHS, Cleatus Delayne GAILS, MD, 2 Units at 06/15/24 2124   insulin  aspart (novoLOG ) injection 3 Units, 3 Units, Subcutaneous, TID WC, Trudy Anthony HERO, MD, 3 Units at 06/15/24 1809   insulin  glargine (LANTUS ) injection 10 Units, 10 Units, Subcutaneous, Daily, Trudy Anthony HERO, MD, 10 Units at 06/15/24 1209   isosorbide  mononitrate (IMDUR ) 24 hr tablet 30 mg, 30 mg, Oral, Daily, Cleatus Delayne V, MD, 30 mg at 06/15/24 1549   levothyroxine  (SYNTHROID ) tablet 25 mcg, 25 mcg, Oral, Q0600, Cleatus Delayne GAILS, MD, 25 mcg at 06/15/24 1554   losartan  (COZAAR ) tablet 25 mg, 25 mg, Oral, Daily, Bandy Honaker, MD   methylPREDNISolone  sodium succinate (SOLU-MEDROL ) 500 mg in sodium chloride  0.9 % 50 mL IVPB, 500 mg, Intravenous, Daily, Kameria Canizares, MD   metoprolol  succinate (TOPROL -XL) 24 hr tablet 12.5 mg, 12.5 mg, Oral, Daily, Hudson, Caralyn, PA-C, 12.5 mg at 06/15/24 1628   ondansetron  (ZOFRAN ) tablet 4 mg, 4 mg, Oral, Q6H PRN **OR** ondansetron  (ZOFRAN ) injection 4 mg, 4 mg, Intravenous, Q6H PRN, Cleatus Delayne GAILS, MD   sodium chloride  flush (NS) 0.9 % injection 3 mL, 3 mL, Intravenous, Q12H, Paraschos, Alexander, MD, 3 mL at 06/15/24 2123   sodium chloride  flush (NS) 0.9 % injection 3 mL, 3 mL, Intravenous, PRN, Paraschos, Alexander, MD    ALLERGIES   Patient has no known allergies.     REVIEW OF SYSTEMS    Review of Systems:  Gen:  Denies  fever, sweats, chills weigh loss  HEENT: Denies blurred vision, double vision, ear pain, eye pain, hearing loss, nose bleeds, sore throat Cardiac:  No dizziness, chest pain or heaviness, chest tightness,edema Resp:   reports dyspnea chronically  Gi: Denies swallowing  difficulty, stomach pain, nausea or vomiting, diarrhea, constipation, bowel incontinence Gu:  Denies bladder incontinence, burning urine Ext:   Denies Joint pain, stiffness or swelling Skin: Denies  skin rash, easy bruising or bleeding or hives Endoc:  Denies polyuria, polydipsia , polyphagia or weight change Psych:   Denies depression, insomnia or hallucinations  Other:  All other systems negative   VS: BP 126/74 (BP Location: Left Arm)   Pulse (!) 52   Temp 98 F (36.7 C)   Resp 16   Ht 5' 4 (1.626 m)   Wt 59.7 kg   SpO2 95%   BMI 22.59 kg/m      PHYSICAL EXAM    GENERAL:NAD, no fevers, chills, no weakness no fatigue HEAD: Normocephalic, atraumatic.  EYES: Pupils equal, round, reactive to light. Extraocular muscles intact. No scleral icterus.  MOUTH: Moist mucosal membrane. Dentition intact. No abscess noted.  EAR, NOSE, THROAT: Clear without exudates. No external lesions.  NECK: Supple. No thyromegaly. No nodules. No JVD.  PULMONARY: decreased breath sounds with mild rhonchi worse at bases bilaterally.  CARDIOVASCULAR: S1 and S2. Regular rate and rhythm. No murmurs, rubs, or gallops. No edema. Pedal pulses 2+ bilaterally.  GASTROINTESTINAL: Soft, nontender, nondistended. No masses. Positive bowel sounds. No hepatosplenomegaly.  MUSCULOSKELETAL: No swelling, clubbing, or edema. Range of motion full in all extremities.  NEUROLOGIC: Cranial nerves II through XII are intact. No gross focal neurological deficits. Sensation intact. Reflexes intact.  SKIN: No ulceration, lesions, rashes, or cyanosis. Skin warm and dry. Turgor intact.  PSYCHIATRIC: Mood, affect within normal limits. The patient is awake, alert and oriented x 3. Insight, judgment intact.       IMAGING   Narrative & Impression  EXAM: CTA of the Chest with contrast for PE 06/11/2024 01:13:52 AM   TECHNIQUE: CTA of the chest was performed after the administration of 75 mL of iohexol  (OMNIPAQUE ) 350 MG/ML  injection. Multiplanar reformatted images are provided for review. MIP images are provided for review. Automated exposure control, iterative reconstruction, and/or weight based adjustment of the mA/kV was utilized to reduce the radiation dose to as low as reasonably achievable.   COMPARISON: Prior examination 12/2023 and more remote prior examination of 10/26/2023.   CLINICAL HISTORY: Pulmonary embolism (PE) suspected, high prob. Breast cancer. *tracking code: Bo*   FINDINGS:   PULMONARY ARTERIES: Pulmonary arteries are adequately opacified for evaluation. No pulmonary embolism. The central pulmonary arteries are enlarged in keeping with changes of pulmonary arterial hypertension. Main pulmonary artery is normal in caliber.   MEDIASTINUM: Mild coronary artery calcification. Mild cardiomegaly with asymmetric enlargement of the right heart chambers in keeping with elevated right heart pressure. No pericardial effusion. Mild atherosclerotic calcifications within the thoracic aorta. No aortic aneurysm.   LYMPH NODES: Pathologic mediastinal and bilateral hilar adenopathy is present with the index lymph node measuring 17 mm in short axis diameter within the subcarinal lymph node group and 18 mm in diameter within the right hilar lymph node groove (54/4). These appear stable since the prior examination 12/2023 and more remote prior examination of 10/26/2023 possible reflecting the sequela of treated disease, and inflammatory processes such as sarcoidosis, or a low-grade lymphoproliferative process.   LUNGS AND PLEURA: Mild diffuse ground-glass pulmonary opacity is again seen demonstrating a basilar predominance, progressive since prior examination and most suggestive of mild alveolar pulmonary edema. No pneumothorax or pleural effusion.   UPPER ABDOMEN: Limited images of the upper abdomen are unremarkable.   SOFT TISSUES AND BONES: No acute bone or soft tissue abnormality.    IMPRESSION: 1. No pulmonary embolism. 2. Cardiomegaly with asymmetric enlargement of the right heart chambers been with elevated right heart pressure. 3. Morphologic changes in keeping with pulmonary arterial hypertension. 4. Mild diffuse ground-glass pulmonary opacity with basilar predominance, progressive since prior examination, most suggestive of mild alveolar pulmonary  edema. 5. Pathologic mediastinal and bilateral hilar adenopathy, stable since prior examination, possibly reflecting sequela of treated disease, inflammatory processes such as sarcoidosis, or a low-grade lymphoproliferative process.   Electronically signed by: Dorethia Molt MD 06/11/2024 01:25 AM EST RP Workstation: [id number]   ANA Comprehensive Panel - [facility] Specimen: Blood Component Ref Range & Units 1 mo ago Comments  Anti-DNA (DS) Ab Qn - [facility] 0 - 9 IU/mL <1                                    Negative      <5                                    Equivocal  5 - 9                                    Positive      >9  RNP Antibodies - [facility] 0.0 - 0.9 AI <0.2   Smith Antibodies - [facility] 0.0 - 0.9 AI <0.2   Antiscleroderma-70 Antibodies - [facility] 0.0 - 0.9 AI <0.2   Sjogren's Anti-SS-A - [facility] 0.0 - 0.9 AI <0.2   Sjogren's Anti-SS-B - [facility] 0.0 - 0.9 AI <0.2   Antichromatin Antibodies - [facility] 0.0 - 0.9 AI <0.2   Anti-Jo-1 - [facility] 0.0 - 0.9 AI <0.2   Anti-Centromere B Antibodies - [facility] 0.0 - 0.9 AI <0.2   See below: - [facility] Comment Autoantibody                       Disease Association ------------------------------------------------------------                         Condition                  Frequency ---------------------   ------------------------   --------- Antinuclear Antibody,    SLE, mixed connective Direct (ANA-D)           tissue diseases ---------------------   ------------------------   --------- dsDNA                    SLE                        40 -  60% ---------------------   ------------------------   --------- Chromatin                Drug induced SLE                90%                          SLE                        48 - 97% ---------------------   ------------------------   --------- SSA (Ro)                 SLE                        25 - 35%  Sjogren's Syndrome         40 - 70%                          Neonatal Lupus                 100% ---------------------   ------------------------   --------- SSB (La)                 SLE                             10%                          Sjogren's Syndrome              30% ---------------------   -----------------------    --------- Sm (anti-Smith)          SLE                        15 - 30% ---------------------   -----------------------    --------- RNP                      Mixed Connective Tissue                          Disease                         95% (U1 nRNP,                SLE                        30 - 50% anti-ribonucleoprotein)  Polymyositis and/or                          Dermatomyositis                 20% ---------------------   ------------------------   --------- Scl-70 (antiDNA          Scleroderma (diffuse)      20 - 35% topoisomerase)           Crest                           13% ---------------------   ------------------------   --------- Jo-1                     Polymyositis and/or                          Dermatomyositis            20 - 40% ---------------------   ------------------------   --------- Centromere B             Scleroderma - Crest                          variant                         80%     ASSESSMENT/PLAN   Acute hypoxemic respiratory failure - Patient recently  seen in pulmonology clinic-her most recent CT chest was suggestive of underlying interstitial lung disease with reticular nodular peripheral opacification and septal thickening as well as mild honeycombing.  Current CT chest shows similar findings  to prior with suggestion of possible underlying sarcoidosis or a low-grade lymphoproliferative process. -  autoimmune workup was negative which was performed with [facility] last month and including rheumatoid factor negative, ANA comprehensive panel negative, Myo marker panel negative, hypersensitivity pneumonitis negative, mold profile negative, allergy RAST panel negative. - elevated ProBNP - s/p diuresis with lasix  which helped and we can continue this. 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   Patient/Family are satisfied with care plan and all questions have been answered.    Provider disclosure: Patient with at least one acute or chronic illness or injury that poses a threat to life or bodily function and is being managed actively during this encounter.  All of the below services have been performed independently by signing provider:  review of prior documentation from internal and or external health records.  Review of previous and current lab results.  Interview and comprehensive assessment during patient visit today. Review of current and previous chest radiographs/CT scans. Discussion of management and test interpretation with health care team and patient/family.   This document was prepared using Dragon voice recognition software and may include unintentional dictation errors.     Graham Hyun, M.D.  Division of Pulmonary & Critical Care Medicine

## 2024-06-16 NOTE — Discharge Summary (Signed)
 Physician Discharge Summary  BOOTS MCGLOWN FMW:[id number] DOB: 16-Aug-1945 DOA: 06/10/2024  PCP: Sherial Bail, MD  Admit date: 06/10/2024 Discharge date: 06/16/2024  Admitted From: home Disposition:  home  Recommendations for Outpatient Follow-up:  Follow up with PCP in 1-2 weeks F/u w/ cardio, Dr. Ammon, in 1-2 weeks F/u w/ pulmon, Dr. Aleskerov, in 1-2 weeks  Home Health: no Equipment/Devices: uses O2 prn at home   Discharge Condition: stable  CODE STATUS:full  Diet recommendation: Heart Healthy / Carb Modified   Brief/Interim Summary: HPI was taken from Dr. Cleatus: Michelle Hawkins is a 78 y.o. female with medical history significant for DM, HTN, COPD, former smoker, breast cancer s/p chemoradiation, palpitations and exertional dyspnea, being worked up by pulmonology (prior negative cardiac workup)-prior CT chest with bibasilar pulmonary fibrosis/mediastinal adenopathy/pulmonary hypertension who is being admitted with substernal chest pain and possible new onset CHF.  Patient was in her usual state of health until the night of arrival when she developed central pressure-like chest pain while at rest associated with shortness of breath.  Denies nausea, vomiting or diaphoresis.  Denies palpitations or lightheadedness.  She denies cough, wheezing, lower extremity pain.  Has had no fever or chills. Of note, patient was seen by cardiology on 12/1 and had a negative stress test earlier this year in April, EF 70%, with a minimal nonobstructive CAD on coronary CTA. In the ED, vitals within normal limits.  Labs notable for troponin 17 and proBNP 3081.  CBC notable for mild leukocytosis of 12.2 and new thrombocytopenia of 92, down from 393 a couple months prior CMP unremarkable.  Respiratory viral panel negative for COVID flu and RSV EKG showed sinus rhythm at 64 with borderline T wave abnormalities Patient treated with morphine  with improvement in pain CTA chest negative for PE showed  cardiomegaly and mild alveolar pulmonary edema among other findings as well as stable mediastinal and hilar adenopathy-please see formal report   Patient treated with morphine  with improvement in pain Admission requested    Addendum: Following admission, patient developed chest pain as she got up to use the bathroom, ventricular trigeminy noted on the monitor-strip in epic  Discharge Diagnoses:  Principal Problem:   Chest pain Active Problems:   Possible new onset CHF (congestive heart failure) (HCC)   Thrombocytopenia, new   Mediastinal lymphadenopathy   Diabetes mellitus (HCC)   History of breast cancer s/p chemoradiation   COPD (chronic obstructive pulmonary disease) (HCC)   Ventricular trigeminy  Acute diastolic CHF exacerbation: started on po lasix . Monitor I/Os. Continue on metoprolol , losartan , metoprolol . S/p cardiac cath which showed mild to moderate pulmonary hypertension. No significant obstructive CAD. Cardio following and recs apprec   ILD: continue w/ supportive care. Steroids as per pulmon. Continue on bronchodilators. Pulmon following and recs apprec   Chest pain: w/ hx of nonobstructive CAD. Neg stress test 10/2023. CTA chest neg for PE    Thrombocytopenia: etiology unclear. Will continue to monitor     Mediastinal lymphadenopathy: etiology unclear. Will need f/u outpatient w/ pulmon   Pulmonary hypertension: continue w/ supportive care. Will need to f/u outpatient w/ pulmon    COPD: continue on azithromycin , bronchodilators & encourage incentive spirometry    Hx of breast cancer: s/p chemoradiation. Management per onco outpatient    DM2: well controlled, HbA1c 6.8. Continue on SSI w/ accuchecks  Discharge Instructions  Discharge Instructions     AMB Referral to Cardiac Rehabilitation - Phase II   Complete by: As directed  Diagnosis: Heart Failure (see criteria below if ordering Phase II)   Heart Failure Type: Chronic Diastolic   After initial evaluation  and assessments completed: Virtual Based Care may be provided alone or in conjunction with Phase 2 Cardiac Rehab based on patient barriers.: Yes   Intensive Cardiac Rehabilitation (ICR) [facility] location only OR Traditional Cardiac Rehabilitation (TCR) *If criteria for ICR are not met will enroll in TCR [facility] only): Yes   Diet - low sodium heart healthy   Complete by: As directed    Diet Carb Modified   Complete by: As directed    Discharge instructions   Complete by: As directed    F/u w/ PCP in 1-2 weeks. F/u w/ cardio, Dr. Ammon, in 1-2 weeks. F/u w/ pulmon, Dr. Parris, in 1-2 weeks   Increase activity slowly   Complete by: As directed       Allergies as of 06/16/2024   No Known Allergies      Medication List     STOP taking these medications    cholecalciferol 10 MCG (400 UNIT) Tabs tablet Commonly known as: VITAMIN D3   multivitamin with minerals Tabs tablet   omeprazole  40 MG capsule Commonly known as: PRILOSEC       TAKE these medications    Accu-Chek Softclix Lancets lancets Check blood sugar twice daily Dx 250.00   acetaminophen  500 MG tablet Commonly known as: TYLENOL  Take 500 mg by mouth every 6 (six) hours as needed for mild pain (pain score 1-3).   albuterol  108 (90 Base) MCG/ACT inhaler Commonly known as: VENTOLIN  HFA Inhale 1-2 puffs into the lungs every 4 (four) hours as needed.   aspirin  81 MG tablet Take 162 mg by mouth every evening.   atorvastatin  20 MG tablet Commonly known as: LIPITOR TAKE ONE TABLET EVERY DAY   azithromycin  250 MG tablet Commonly known as: ZITHROMAX  Take 1 tablet (250 mg total) by mouth daily for 1 day.   Biotin 5 MG Caps Take 5 mg by mouth daily.   calcium  citrate 950 (200 Ca) MG tablet Commonly known as: CALCITRATE - dosed in mg elemental calcium  Take 200 mg of elemental calcium  by mouth daily.   cyclobenzaprine 10 MG tablet Commonly known as: FLEXERIL Take 10 mg by mouth 2 (two) times daily as needed.    escitalopram  20 MG tablet Commonly known as: LEXAPRO  TAKE ONE TABLET EVERY DAY   ferrous sulfate 325 (65 FE) MG EC tablet Take 325 mg by mouth daily with breakfast.   fish oil-omega-3 fatty acids 1000 MG capsule Take 2 g by mouth daily.   fluticasone -salmeterol 250-50 MCG/ACT Aepb Commonly known as: ADVAIR Inhale 1 puff into the lungs 2 (two) times daily.   FreeStyle Libre 14 Day Reader Espiridion Inject 1 Device into the skin every 14 (fourteen) days. Place 1 sensor every 14 days. Use to check sugar at least 4 times daily   FreeStyle Libre 14 Day Sensor Misc Inject 1 Device into the skin every 14 (fourteen) days.   furosemide  40 MG tablet Commonly known as: LASIX  Take 1 tablet (40 mg total) by mouth daily. Start taking on: June 17, 2024   glucose blood test strip Commonly known as: ONE TOUCH ULTRA TEST CHECK BLOOD SUGAR UP TO THREE TIMES DAILY Dx E11.9   isosorbide  mononitrate 30 MG 24 hr tablet Commonly known as: IMDUR  Take 1 tablet (30 mg total) by mouth daily.   levothyroxine  25 MCG tablet Commonly known as: SYNTHROID  TAKE 1 TABLET EVERY DAY  ON EMPTY STOMACHWITH A GLASS OF WATER  AT LEAST 30-60 MINBEFORE BREAKFAST   losartan  25 MG tablet Commonly known as: COZAAR  Take 1 tablet (25 mg total) by mouth daily. Start taking on: June 17, 2024 What changed:  medication strength how much to take   metFORMIN  500 MG 24 hr tablet Commonly known as: GLUCOPHAGE -XR TAKE TWO TABLETS TWICE DAILY What changed: See the new instructions.   metoprolol  succinate 50 MG 24 hr tablet Commonly known as: TOPROL -XL TAKE 1 TABLET BY MOUTH DAILY WITH OR FOLLOWING A MEAL   ONE TOUCH ULTRA SYSTEM KIT w/Device Kit 1 kit by Does not apply route once.   predniSONE  5 MG tablet Commonly known as: DELTASONE  50mg  daily x 1 day, 45 daily x 1 day, 40mg  daily x 1 day, 35 mg daily x 1 day, 30 mg daily x 1 day, 25mg  daily x 1 day, 20mg  daily x 1 day, 15 mg daily x 1 day, 10mg  daily x 1 day,  5mg  daily x 1 day then stop   PROBIOTIC DAILY PO Take 1 tablet by mouth.   TURMERIC PO Take by mouth daily.        Follow-up Information     Paraschos, Alexander, MD. Go in 1 week(s).   Specialty: Cardiology Contact information: [address] [facility] [place] [address] [phone]                No Known Allergies  Consultations: Cardio Pulmon    Procedures/Studies: CARDIAC CATHETERIZATION Result Date: 06/15/2024   Prox RCA lesion is 20% stenosed.   The left ventricular systolic function is normal.   LV end diastolic pressure is normal.   The left ventricular ejection fraction is 55-65% by visual estimate.   Hemodynamic findings consistent with moderate pulmonary hypertension.   There is mild (2+) mitral regurgitation. 1.  Mild to moderate pulmonary hypertension, PA 59/23  (36) 2.  Near normal coronary anatomy 3.  Normal left ventricular function,  PW 23/13  (19) 4.  Mild mitral regurgitation Recommendations 1.  Agree with overall current therapy 2.  Diuresis as needed 3.  Continue good medical management 4.  Follow-up with Dr.  Aleskerov 5.  Follow-up with me 1 to 2 weeks 6.  May discharge home later today   ECHOCARDIOGRAM COMPLETE Result Date: 06/11/2024    ECHOCARDIOGRAM REPORT   Patient Name:   Michelle Hawkins Date of Exam: 06/11/2024 Medical Rec #:  [id number]    Height:       64.0 in Accession #:    [id number]   Weight:       132.0 lb Date of Birth:  March 30, 1946    BSA:          1.640 m Patient Age:    78 years     BP:           122/93 mmHg Patient Gender: F            HR:           50 bpm. Exam Location:  [facility] Procedure: 2D Echo, Color Doppler, Cardiac Doppler and Intracardiac            Opacification Agent (Both Spectral and Color Flow Doppler were            utilized during procedure). Indications:     CHF-Acute Diastolic I50.31  History:         Patient has prior history of Echocardiogram examinations, most  recent  10/27/2023. CHF.  Sonographer:     Ashley McNeely-Sloane Referring Phys:  [id number] DELAYNE LULLA SOLIAN Diagnosing Phys: Cara JONETTA Lovelace MD IMPRESSIONS  1. Left ventricular ejection fraction, by estimation, is 60 to 65%. The left ventricle has normal function. The left ventricle has no regional wall motion abnormalities. Left ventricular diastolic parameters are consistent with Grade I diastolic dysfunction (impaired relaxation). There is the interventricular septum is flattened in diastole ('D' shaped left ventricle), consistent with right ventricular volume overload.  2. Right ventricular systolic function is mildly reduced. The right ventricular size is moderately enlarged.  3. The mitral valve is grossly normal. Moderate mitral valve regurgitation.  4. The aortic valve is normal in structure. Aortic valve regurgitation is not visualized. FINDINGS  Left Ventricle: Left ventricular ejection fraction, by estimation, is 60 to 65%. The left ventricle has normal function. The left ventricle has no regional wall motion abnormalities. Definity  contrast agent was given IV to delineate the left ventricular  endocardial borders. Strain was performed and the global longitudinal strain is indeterminate. The left ventricular internal cavity size was normal in size. There is no left ventricular hypertrophy. The interventricular septum is flattened in diastole ('D' shaped left ventricle), consistent with right ventricular volume overload. Left ventricular diastolic parameters are consistent with Grade I diastolic dysfunction (impaired relaxation). Right Ventricle: The right ventricular size is moderately enlarged. No increase in right ventricular wall thickness. Right ventricular systolic function is mildly reduced. Left Atrium: Left atrial size was normal in size. Right Atrium: Right atrial size was normal in size. Pericardium: There is no evidence of pericardial effusion. Mitral Valve: The mitral valve is grossly normal. Moderate  mitral valve regurgitation. MV peak gradient, 4.2 mmHg. The mean mitral valve gradient is 1.0 mmHg. Tricuspid Valve: The tricuspid valve is normal in structure. Tricuspid valve regurgitation is mild. Aortic Valve: The aortic valve is normal in structure. Aortic valve regurgitation is not visualized. Aortic valve mean gradient measures 2.0 mmHg. Aortic valve peak gradient measures 4.5 mmHg. Aortic valve area, by VTI measures 1.91 cm. Pulmonic Valve: The pulmonic valve was normal in structure. Pulmonic valve regurgitation is not visualized. Aorta: The ascending aorta was not well visualized. IAS/Shunts: No atrial level shunt detected by color flow Doppler. Additional Comments: 3D was performed not requiring image post processing on an independent workstation and was indeterminate.  LEFT VENTRICLE PLAX 2D LVIDd:         3.90 cm     Diastology LVIDs:         2.00 cm     LV e' medial:    4.79 cm/s LV PW:         1.00 cm     LV E/e' medial:  11.8 LV IVS:        1.00 cm     LV e' lateral:   5.55 cm/s LVOT diam:     1.70 cm     LV E/e' lateral: 10.2 LV SV:         50 LV SV Index:   30 LVOT Area:     2.27 cm  LV Volumes (MOD) LV vol d, MOD A2C: 45.7 ml LV vol d, MOD A4C: 49.6 ml LV vol s, MOD A2C: 15.7 ml LV vol s, MOD A4C: 16.7 ml LV SV MOD A2C:     30.0 ml LV SV MOD A4C:     49.6 ml LV SV MOD BP:      31.3 ml RIGHT VENTRICLE  IVC RV Basal diam:  4.10 cm    IVC diam: 2.70 cm RV Mid diam:    3.70 cm RV S prime:     9.57 cm/s TAPSE (M-mode): 2.2 cm LEFT ATRIUM             Index        RIGHT ATRIUM           Index LA diam:        2.70 cm 1.65 cm/m   RA Area:     18.70 cm LA Vol (A2C):   26.2 ml 15.98 ml/m  RA Volume:   54.80 ml  33.42 ml/m LA Vol (A4C):   54.5 ml 33.24 ml/m LA Biplane Vol: 38.3 ml 23.36 ml/m  AORTIC VALVE                    PULMONIC VALVE AV Area (Vmax):    2.23 cm     PV Vmax:          0.78 m/s AV Area (Vmean):   2.11 cm     PV Vmean:         52.700 cm/s AV Area (VTI):     1.91 cm     PV  VTI:           0.184 m AV Vmax:           106.00 cm/s  PV Peak grad:     2.4 mmHg AV Vmean:          68.800 cm/s  PV Mean grad:     1.0 mmHg AV VTI:            0.261 m      PR End Diast Vel: 2.65 msec AV Peak Grad:      4.5 mmHg     RVOT Peak grad:   1 mmHg AV Mean Grad:      2.0 mmHg LVOT Vmax:         104.00 cm/s LVOT Vmean:        63.900 cm/s LVOT VTI:          0.220 m LVOT/AV VTI ratio: 0.84  AORTA Ao Root diam: 2.90 cm Ao Asc diam:  3.10 cm MITRAL VALVE               TRICUSPID VALVE MV Area (PHT): 2.54 cm    TR Peak grad:   63.7 mmHg MV Area VTI:   1.50 cm    TR Mean grad:   27.0 mmHg MV Peak grad:  4.2 mmHg    TR Vmax:        399.00 cm/s MV Mean grad:  1.0 mmHg    TR Vmean:       251.0 cm/s MV Vmax:       1.02 m/s MV Vmean:      47.1 cm/s   SHUNTS MV Decel Time: 299 msec    Systemic VTI:  0.22 m MV E velocity: 56.60 cm/s  Systemic Diam: 1.70 cm MV A velocity: 83.30 cm/s  Pulmonic VTI:  0.114 m MV E/A ratio:  0.68 Dwayne D Callwood MD Electronically signed by Cara JONETTA Lovelace MD Signature Date/Time: 06/11/2024/8:17:45 PM    Final    CT Angio Chest Pulmonary Embolism (PE) W or WO Contrast Result Date: 06/11/2024 EXAM: CTA of the Chest with contrast for PE 06/11/2024 01:13:52 AM TECHNIQUE: CTA of the chest was performed after the administration of 75 mL of iohexol  (OMNIPAQUE ) 350 MG/ML  injection. Multiplanar reformatted images are provided for review. MIP images are provided for review. Automated exposure control, iterative reconstruction, and/or weight based adjustment of the mA/kV was utilized to reduce the radiation dose to as low as reasonably achievable. COMPARISON: Prior examination 12/2023 and more remote prior examination of 10/26/2023. CLINICAL HISTORY: Pulmonary embolism (PE) suspected, high prob. Breast cancer. *tracking code: Bo* FINDINGS: PULMONARY ARTERIES: Pulmonary arteries are adequately opacified for evaluation. No pulmonary embolism. The central pulmonary arteries are enlarged in keeping  with changes of pulmonary arterial hypertension. Main pulmonary artery is normal in caliber. MEDIASTINUM: Mild coronary artery calcification. Mild cardiomegaly with asymmetric enlargement of the right heart chambers in keeping with elevated right heart pressure. No pericardial effusion. Mild atherosclerotic calcifications within the thoracic aorta. No aortic aneurysm. LYMPH NODES: Pathologic mediastinal and bilateral hilar adenopathy is present with the index lymph node measuring 17 mm in short axis diameter within the subcarinal lymph node group and 18 mm in diameter within the right hilar lymph node groove (54/4). These appear stable since the prior examination 12/2023 and more remote prior examination of 10/26/2023 possible reflecting the sequela of treated disease, and inflammatory processes such as sarcoidosis, or a low-grade lymphoproliferative process. LUNGS AND PLEURA: Mild diffuse ground-glass pulmonary opacity is again seen demonstrating a basilar predominance, progressive since prior examination and most suggestive of mild alveolar pulmonary edema. No pneumothorax or pleural effusion. UPPER ABDOMEN: Limited images of the upper abdomen are unremarkable. SOFT TISSUES AND BONES: No acute bone or soft tissue abnormality. IMPRESSION: 1. No pulmonary embolism. 2. Cardiomegaly with asymmetric enlargement of the right heart chambers been with elevated right heart pressure. 3. Morphologic changes in keeping with pulmonary arterial hypertension. 4. Mild diffuse ground-glass pulmonary opacity with basilar predominance, progressive since prior examination, most suggestive of mild alveolar pulmonary edema. 5. Pathologic mediastinal and bilateral hilar adenopathy, stable since prior examination, possibly reflecting sequela of treated disease, inflammatory processes such as sarcoidosis, or a low-grade lymphoproliferative process. Electronically signed by: Dorethia Molt MD 06/11/2024 01:25 AM EST RP Workstation:  [id number]   DG Chest 2 View Result Date: 06/11/2024 EXAM: 2 VIEW(S) XRAY OF THE CHEST 06/10/2024 11:56:00 PM COMPARISON: 05/26/2024 CLINICAL HISTORY: cough, chest pain FINDINGS: LUNGS AND PLEURA: No focal pulmonary opacity. No pleural effusion. No pneumothorax. HEART AND MEDIASTINUM: No acute abnormality of the cardiac and mediastinal silhouettes. BONES AND SOFT TISSUES: Left axillary surgical clips noted. Thoracolumbar scoliosis stable. No acute osseous abnormality. IMPRESSION: 1. No acute cardiopulmonary process. Electronically signed by: Norman Gatlin MD 06/11/2024 12:05 AM EST RP Workstation: [id number]   NM Pulmonary Perfusion Result Date: 05/26/2024 CLINICAL DATA:  Worsening shortness of breath for 6 months. Elevated D-dimer levels. EXAM: NUCLEAR MEDICINE PERFUSION LUNG SCAN TECHNIQUE: Perfusion images were obtained in multiple projections after intravenous injection of radiopharmaceutical. No ventilation imaging performed. RADIOPHARMACEUTICALS:  3.86 mCi Tc-49m MAA IV COMPARISON:  Chest radiographs 05/26/2024, noncontrast chest CT 05/14/2024 and chest CTA 10/26/2023 FINDINGS: There is patchy decreased perfusion in both upper lobes, most likely secondary to the emphysema demonstrated on the patient's CT. There are no wedge-shaped perfusion defects to suggest acute pulmonary embolism. IMPRESSION: 1. No evidence of acute pulmonary embolism on perfusion scintigraphy by PISAPED criteria. 2. Decreased perfusion at both lung apices, likely due to emphysema. Electronically Signed   By: Elsie Perone M.D.   On: 05/26/2024 13:50   DG Chest 2 View Result Date: 05/26/2024 CLINICAL DATA:  Shortness of breath. EXAM: CHEST - 2 VIEW COMPARISON:  Chest radiograph dated 10/26/2023. FINDINGS: No  focal consolidation, pleural effusion or pneumothorax. The cardiac silhouette is within normal limits. No acute osseous pathology. Degenerative changes of the spine. Left axillary surgical clips. IMPRESSION: No active  cardiopulmonary disease. Electronically Signed   By: Vanetta Chou M.D.   On: 05/26/2024 13:02   (Echo, Carotid, EGD, Colonoscopy, ERCP)    Subjective: Pt c/o fatigue    Discharge Exam: Vitals:   06/16/24 0829 06/16/24 1232  BP: (!) 142/85 (!) 122/57  Pulse: (!) 55 (!) 50  Resp: 17 16  Temp: 98.2 F (36.8 C) 98.2 F (36.8 C)  SpO2: 97% 96%   Vitals:   06/16/24 0500 06/16/24 0520 06/16/24 0829 06/16/24 1232  BP:  126/74 (!) 142/85 (!) 122/57  Pulse:  (!) 52 (!) 55 (!) 50  Resp:  16 17 16   Temp:  98 F (36.7 C) 98.2 F (36.8 C) 98.2 F (36.8 C)  TempSrc:   Oral   SpO2:  95% 97% 96%  Weight: 59.7 kg     Height:        General: Pt is alert, awake, not in acute distress Cardiovascular: S1/S2 +, no rubs, no gallops Respiratory: decreased breath sounds b/l  Abdominal: Soft, NT, ND, bowel sounds + Extremities:  no cyanosis    The results of significant diagnostics from this hospitalization (including imaging, microbiology, ancillary and laboratory) are listed below for reference.     Microbiology: Recent Results (from the past 240 hours)  Resp panel by RT-PCR (RSV, Flu A&B, Covid) Anterior Nasal Swab     Status: None   Collection Time: 06/11/24 12:05 AM   Specimen: Anterior Nasal Swab  Result Value Ref Range Status   SARS Coronavirus 2 by RT PCR NEGATIVE NEGATIVE Final    Comment: (NOTE) SARS-CoV-2 target nucleic acids are NOT DETECTED.  The SARS-CoV-2 RNA is generally detectable in upper respiratory specimens during the acute phase of infection. The lowest concentration of SARS-CoV-2 viral copies this assay can detect is 138 copies/mL. A negative result does not preclude SARS-Cov-2 infection and should not be used as the sole basis for treatment or other patient management decisions. A negative result may occur with  improper specimen collection/handling, submission of specimen other than nasopharyngeal swab, presence of viral mutation(s) within the areas  targeted by this assay, and inadequate number of viral copies(<138 copies/mL). A negative result must be combined with clinical observations, patient history, and epidemiological information. The expected result is Negative.  Fact Sheet for Patients:  bloggercourse.com  Fact Sheet for Healthcare Providers:  seriousbroker.it  This test is no t yet approved or cleared by the [place]  FDA and  has been authorized for detection and/or diagnosis of SARS-CoV-2 by FDA under an Emergency Use Authorization (EUA). This EUA will remain  in effect (meaning this test can be used) for the duration of the COVID-19 declaration under Section 564(b)(1) of the Act, 21 U.S.C.section 360bbb-3(b)(1), unless the authorization is terminated  or revoked sooner.       Influenza A by PCR NEGATIVE NEGATIVE Final   Influenza B by PCR NEGATIVE NEGATIVE Final    Comment: (NOTE) The Xpert Xpress SARS-CoV-2/FLU/RSV plus assay is intended as an aid in the diagnosis of influenza from Nasopharyngeal swab specimens and should not be used as a sole basis for treatment. Nasal washings and aspirates are unacceptable for Xpert Xpress SARS-CoV-2/FLU/RSV testing.  Fact Sheet for Patients: bloggercourse.com  Fact Sheet for Healthcare Providers: seriousbroker.it  This test is not yet approved or cleared by the [place]   FDA and has been authorized for detection and/or diagnosis of SARS-CoV-2 by FDA under an Emergency Use Authorization (EUA). This EUA will remain in effect (meaning this test can be used) for the duration of the COVID-19 declaration under Section 564(b)(1) of the Act, 21 U.S.C. section 360bbb-3(b)(1), unless the authorization is terminated or revoked.     Resp Syncytial Virus by PCR NEGATIVE NEGATIVE Final    Comment: (NOTE) Fact Sheet for  Patients: bloggercourse.com  Fact Sheet for Healthcare Providers: seriousbroker.it  This test is not yet approved or cleared by the [place]  FDA and has been authorized for detection and/or diagnosis of SARS-CoV-2 by FDA under an Emergency Use Authorization (EUA). This EUA will remain in effect (meaning this test can be used) for the duration of the COVID-19 declaration under Section 564(b)(1) of the Act, 21 U.S.C. section 360bbb-3(b)(1), unless the authorization is terminated or revoked.  Performed at [facility], [address] [place], [place] [address]      Labs: BNP (last 3 results) Recent Labs    10/26/23 1828  BNP 423.4*   Basic Metabolic Panel: Recent Labs  Lab 06/12/24 0409 06/13/24 0352 06/13/24 1512 06/14/24 2110 06/15/24 0333 06/15/24 0813 06/15/24 0828 06/16/24 0341  NA 131* 135  --  128* 133* 135 137 131*  K 4.1 3.2*  --  4.3 4.6 4.5 4.3 4.4  CL 97* 97*  --  92* 96*  --   --  95*  CO2 24 24  --  22 24  --   --  24  GLUCOSE 267* 122* 258* 429* 278*  --   --  240*  BUN 20 23  --  29* 29*  --   --  28*  CREATININE 0.92 0.96  --  1.03* 0.96  --   --  0.89  CALCIUM  9.7 9.3  --  8.8* 8.8*  --   --  8.9  MG 1.8  --   --   --   --   --   --   --    Liver Function Tests: Recent Labs  Lab 06/11/24 0012  AST 34  ALT 29  ALKPHOS 95  BILITOT 0.5  PROT 7.4  ALBUMIN  4.2   No results for input(s): LIPASE, AMYLASE in the last 168 hours. No results for input(s): AMMONIA in the last 168 hours. CBC: Recent Labs  Lab 06/11/24 0012 06/11/24 0600 06/12/24 0409 06/15/24 0333 06/15/24 0813 06/15/24 0828  WBC 12.2* 15.1* 8.1 14.1*  --   --   NEUTROABS  --  11.7*  --   --   --   --   HGB 13.1 12.0 13.0 13.8 14.6 13.9  HCT 40.9 37.8 39.1 41.6 43.0 41.0  MCV 89.5 89.4 87.1 86.1  --   --   PLT 92* 123* PLATELET CLUMPS NOTED ON SMEAR, UNABLE TO ESTIMATE 173  --   --    Cardiac  Enzymes: No results for input(s): CKTOTAL, CKMB, CKMBINDEX, TROPONINI in the last 168 hours. BNP: Invalid input(s): POCBNP CBG: Recent Labs  Lab 06/15/24 1115 06/15/24 1629 06/15/24 2117 06/16/24 0738 06/16/24 1235  GLUCAP 220* 285* 218* 225* 221*   D-Dimer No results for input(s): DDIMER in the last 72 hours. Hgb A1c No results for input(s): HGBA1C in the last 72 hours. Lipid Profile No results for input(s): CHOL, HDL, LDLCALC, TRIG, CHOLHDL, LDLDIRECT in the last 72 hours. Thyroid  function studies No results for input(s): TSH, T4TOTAL, T3FREE, THYROIDAB in the last 72  hours.  Invalid input(s): FREET3 Anemia work up No results for input(s): VITAMINB12, FOLATE, FERRITIN, TIBC, IRON , RETICCTPCT in the last 72 hours. Urinalysis    Component Value Date/Time   COLORURINE YELLOW (A) 06/11/2024 0435   APPEARANCEUR CLEAR (A) 06/11/2024 0435   LABSPEC >1.046 (H) 06/11/2024 0435   PHURINE 5.0 06/11/2024 0435   GLUCOSEU NEGATIVE 06/11/2024 0435   HGBUR NEGATIVE 06/11/2024 0435   BILIRUBINUR NEGATIVE 06/11/2024 0435   KETONESUR NEGATIVE 06/11/2024 0435   PROTEINUR NEGATIVE 06/11/2024 0435   NITRITE NEGATIVE 06/11/2024 0435   LEUKOCYTESUR NEGATIVE 06/11/2024 0435   Sepsis Labs Recent Labs  Lab 06/11/24 0012 06/11/24 0600 06/12/24 0409 06/15/24 0333  WBC 12.2* 15.1* 8.1 14.1*   Microbiology Recent Results (from the past 240 hours)  Resp panel by RT-PCR (RSV, Flu A&B, Covid) Anterior Nasal Swab     Status: None   Collection Time: 06/11/24 12:05 AM   Specimen: Anterior Nasal Swab  Result Value Ref Range Status   SARS Coronavirus 2 by RT PCR NEGATIVE NEGATIVE Final    Comment: (NOTE) SARS-CoV-2 target nucleic acids are NOT DETECTED.  The SARS-CoV-2 RNA is generally detectable in upper respiratory specimens during the acute phase of infection. The lowest concentration of SARS-CoV-2 viral copies this assay can detect is 138  copies/mL. A negative result does not preclude SARS-Cov-2 infection and should not be used as the sole basis for treatment or other patient management decisions. A negative result may occur with  improper specimen collection/handling, submission of specimen other than nasopharyngeal swab, presence of viral mutation(s) within the areas targeted by this assay, and inadequate number of viral copies(<138 copies/mL). A negative result must be combined with clinical observations, patient history, and epidemiological information. The expected result is Negative.  Fact Sheet for Patients:  bloggercourse.com  Fact Sheet for Healthcare Providers:  seriousbroker.it  This test is no t yet approved or cleared by the [place]  FDA and  has been authorized for detection and/or diagnosis of SARS-CoV-2 by FDA under an Emergency Use Authorization (EUA). This EUA will remain  in effect (meaning this test can be used) for the duration of the COVID-19 declaration under Section 564(b)(1) of the Act, 21 U.S.C.section 360bbb-3(b)(1), unless the authorization is terminated  or revoked sooner.       Influenza A by PCR NEGATIVE NEGATIVE Final   Influenza B by PCR NEGATIVE NEGATIVE Final    Comment: (NOTE) The Xpert Xpress SARS-CoV-2/FLU/RSV plus assay is intended as an aid in the diagnosis of influenza from Nasopharyngeal swab specimens and should not be used as a sole basis for treatment. Nasal washings and aspirates are unacceptable for Xpert Xpress SARS-CoV-2/FLU/RSV testing.  Fact Sheet for Patients: bloggercourse.com  Fact Sheet for Healthcare Providers: seriousbroker.it  This test is not yet approved or cleared by the [place]  FDA and has been authorized for detection and/or diagnosis of SARS-CoV-2 by FDA under an Emergency Use Authorization (EUA). This EUA will remain in effect (meaning  this test can be used) for the duration of the COVID-19 declaration under Section 564(b)(1) of the Act, 21 U.S.C. section 360bbb-3(b)(1), unless the authorization is terminated or revoked.     Resp Syncytial Virus by PCR NEGATIVE NEGATIVE Final    Comment: (NOTE) Fact Sheet for Patients: bloggercourse.com  Fact Sheet for Healthcare Providers: seriousbroker.it  This test is not yet approved or cleared by the [place]  FDA and has been authorized for detection and/or diagnosis of SARS-CoV-2 by FDA under an Emergency Use Authorization (EUA).  This EUA will remain in effect (meaning this test can be used) for the duration of the COVID-19 declaration under Section 564(b)(1) of the Act, 21 U.S.C. section 360bbb-3(b)(1), unless the authorization is terminated or revoked.  Performed at [facility], [address], [place], [place] [address]      Time coordinating discharge: 33 minutes  SIGNED:   Anthony CHRISTELLA Pouch, MD  [facility] 06/16/2024, 1:07 PM Pager   If 7PM-7AM, please contact night-coverage [email or web address]

## 2024-06-16 NOTE — Telephone Encounter (Signed)
 Appt postponed to January per Dr. Babara. Pt's daughter Amy notified.

## 2024-06-16 NOTE — TOC Transition Note (Signed)
 Transition of Care [facility]) - Discharge Note   Patient Details  Name: Michelle Hawkins MRN: [id number] Date of Birth: 07-03-1946  [facility] of [facility] CM/SW Contact:  Lauraine JAYSON Carpen, LCSW Phone Number: 06/16/2024, 1:09 PM   Clinical Narrative:   Patient has orders to discharge home today. No further concerns. CSW signing off.  Final next level of care: Home/Self Care Barriers to Discharge: Barriers Resolved   Patient Goals and CMS Choice            Discharge Placement                    Patient and family notified of of transfer: 06/16/24  Discharge Plan and Services Additional resources added to the After Visit Summary for                                       Social Drivers of Health (SDOH) Interventions SDOH Screenings   Food Insecurity: No Food Insecurity (06/12/2024)  Housing: Low Risk  (06/12/2024)  Transportation Needs: No Transportation Needs (06/12/2024)  Utilities: Not At Risk (06/12/2024)  Financial Resource Strain: Low Risk  (11/21/2023)   Received from [facility]  Social Connections: Moderately Integrated (06/12/2024)  Tobacco Use: Medium Risk (06/08/2024)   Received from [facility]     Readmission Risk Interventions     No data to display

## 2024-06-17 ENCOUNTER — Ambulatory Visit

## 2024-06-17 ENCOUNTER — Inpatient Hospital Stay: Attending: Oncology | Admitting: Oncology

## 2024-06-17 LAB — LIPOPROTEIN A (LPA): Lipoprotein (a): 13.5 nmol/L (ref <75.0)

## 2024-07-27 ENCOUNTER — Inpatient Hospital Stay

## 2024-08-03 ENCOUNTER — Inpatient Hospital Stay: Admitting: Oncology

## 2024-08-03 ENCOUNTER — Inpatient Hospital Stay
